# Patient Record
Sex: Male | Born: 1945
Health system: Southern US, Community
[De-identification: ages and names within clinical notes are randomized; demographics above are authoritative.]

## PROBLEM LIST (undated history)

## (undated) DIAGNOSIS — D6861 Antiphospholipid syndrome: Secondary | ICD-10-CM

## (undated) DIAGNOSIS — I10 Essential (primary) hypertension: Secondary | ICD-10-CM

## (undated) DIAGNOSIS — I2699 Other pulmonary embolism without acute cor pulmonale: Secondary | ICD-10-CM

## (undated) DIAGNOSIS — E039 Hypothyroidism, unspecified: Secondary | ICD-10-CM

## (undated) DIAGNOSIS — R4189 Other symptoms and signs involving cognitive functions and awareness: Secondary | ICD-10-CM

## (undated) DIAGNOSIS — E119 Type 2 diabetes mellitus without complications: Secondary | ICD-10-CM

## (undated) DIAGNOSIS — I350 Nonrheumatic aortic (valve) stenosis: Secondary | ICD-10-CM

## (undated) DIAGNOSIS — Z720 Tobacco use: Secondary | ICD-10-CM

---

## 2014-12-24 ENCOUNTER — Encounter (HOSPITAL_COMMUNITY): Payer: Self-pay | Admitting: *Deleted

## 2014-12-24 ENCOUNTER — Inpatient Hospital Stay (HOSPITAL_COMMUNITY): Payer: Medicare Other

## 2014-12-24 ENCOUNTER — Emergency Department (HOSPITAL_COMMUNITY): Payer: Medicare Other

## 2014-12-24 ENCOUNTER — Inpatient Hospital Stay (HOSPITAL_COMMUNITY)
Admission: EM | Admit: 2014-12-24 | Discharge: 2014-12-27 | DRG: 176 | Disposition: A | Discharge status: Home / Self Care | Payer: Medicare Other | Attending: Internal Medicine | Admitting: Internal Medicine

## 2014-12-24 DIAGNOSIS — R0602 Shortness of breath: Secondary | ICD-10-CM | POA: Diagnosis present

## 2014-12-24 DIAGNOSIS — Z87891 Personal history of nicotine dependence: Secondary | ICD-10-CM

## 2014-12-24 DIAGNOSIS — IMO0001 Reserved for inherently not codable concepts without codable children: Secondary | ICD-10-CM | POA: Insufficient documentation

## 2014-12-24 DIAGNOSIS — D7589 Other specified diseases of blood and blood-forming organs: Secondary | ICD-10-CM | POA: Diagnosis present

## 2014-12-24 DIAGNOSIS — I2699 Other pulmonary embolism without acute cor pulmonale: Secondary | ICD-10-CM | POA: Diagnosis not present

## 2014-12-24 DIAGNOSIS — I82411 Acute embolism and thrombosis of right femoral vein: Secondary | ICD-10-CM | POA: Diagnosis present

## 2014-12-24 DIAGNOSIS — R55 Syncope and collapse: Secondary | ICD-10-CM | POA: Diagnosis present

## 2014-12-24 DIAGNOSIS — R0781 Pleurodynia: Secondary | ICD-10-CM | POA: Diagnosis not present

## 2014-12-24 DIAGNOSIS — I10 Essential (primary) hypertension: Secondary | ICD-10-CM | POA: Diagnosis not present

## 2014-12-24 DIAGNOSIS — I82409 Acute embolism and thrombosis of unspecified deep veins of unspecified lower extremity: Secondary | ICD-10-CM | POA: Insufficient documentation

## 2014-12-24 DIAGNOSIS — R911 Solitary pulmonary nodule: Secondary | ICD-10-CM | POA: Diagnosis present

## 2014-12-24 DIAGNOSIS — I82401 Acute embolism and thrombosis of unspecified deep veins of right lower extremity: Secondary | ICD-10-CM | POA: Diagnosis not present

## 2014-12-24 DIAGNOSIS — R030 Elevated blood-pressure reading, without diagnosis of hypertension: Secondary | ICD-10-CM

## 2014-12-24 DIAGNOSIS — Z9181 History of falling: Secondary | ICD-10-CM | POA: Diagnosis not present

## 2014-12-24 DIAGNOSIS — I82431 Acute embolism and thrombosis of right popliteal vein: Secondary | ICD-10-CM | POA: Diagnosis present

## 2014-12-24 DIAGNOSIS — I2692 Saddle embolus of pulmonary artery without acute cor pulmonale: Principal | ICD-10-CM | POA: Diagnosis present

## 2014-12-24 DIAGNOSIS — Z86711 Personal history of pulmonary embolism: Secondary | ICD-10-CM | POA: Insufficient documentation

## 2014-12-24 LAB — BASIC METABOLIC PANEL
ANION GAP: 12 (ref 5–15)
BUN: 14 mg/dL (ref 6–20)
CALCIUM: 8.9 mg/dL (ref 8.9–10.3)
CO2: 17 mmol/L — ABNORMAL LOW (ref 22–32)
Chloride: 106 mmol/L (ref 101–111)
Creatinine, Ser: 1.08 mg/dL (ref 0.61–1.24)
Glucose, Bld: 231 mg/dL — ABNORMAL HIGH (ref 65–99)
POTASSIUM: 3.8 mmol/L (ref 3.5–5.1)
SODIUM: 135 mmol/L (ref 135–145)

## 2014-12-24 LAB — CBC
HCT: 40.1 % (ref 39.0–52.0)
HEMATOCRIT: 41.4 % (ref 39.0–52.0)
Hemoglobin: 14.2 g/dL (ref 13.0–17.0)
Hemoglobin: 14.2 g/dL (ref 13.0–17.0)
MCH: 39.7 pg — ABNORMAL HIGH (ref 26.0–34.0)
MCH: 38.7 pg — ABNORMAL HIGH (ref 26.0–34.0)
MCHC: 35.4 g/dL (ref 30.0–36.0)
MCHC: 34.3 g/dL (ref 30.0–36.0)
MCV: 112 fL — ABNORMAL HIGH (ref 78.0–100.0)
MCV: 112.8 fL — ABNORMAL HIGH (ref 78.0–100.0)
PLATELETS: 110 10*3/uL — AB (ref 150–400)
Platelets: 131 10*3/uL — ABNORMAL LOW (ref 150–400)
RBC: 3.58 MIL/uL — AB (ref 4.22–5.81)
RBC: 3.67 MIL/uL — ABNORMAL LOW (ref 4.22–5.81)
RDW: 13.2 % (ref 11.5–15.5)
RDW: 13.7 % (ref 11.5–15.5)
WBC: 6.7 10*3/uL (ref 4.0–10.5)
WBC: 9.8 10*3/uL (ref 4.0–10.5)

## 2014-12-24 LAB — URINALYSIS, ROUTINE W REFLEX MICROSCOPIC
Bilirubin Urine: NEGATIVE
Glucose, UA: NEGATIVE mg/dL
Ketones, ur: NEGATIVE mg/dL
LEUKOCYTES UA: NEGATIVE
NITRITE: NEGATIVE
PROTEIN: 30 mg/dL — AB
Specific Gravity, Urine: >1.046 — ABNORMAL HIGH (ref 1.005–1.030)
UROBILINOGEN UA: 1 mg/dL (ref 0.0–1.0)
pH: 5.5 (ref 5.0–8.0)

## 2014-12-24 LAB — D-DIMER, QUANTITATIVE (NOT AT ARMC): D DIMER QUANT: 7.19 ug{FEU}/mL — AB (ref 0.00–0.48)

## 2014-12-24 LAB — HEPARIN LEVEL (UNFRACTIONATED): HEPARIN UNFRACTIONATED: 0.26 [IU]/mL — AB (ref 0.30–0.70)

## 2014-12-24 LAB — CBG MONITORING, ED: GLUCOSE-CAPILLARY: 160 mg/dL — AB (ref 65–99)

## 2014-12-24 LAB — GLUCOSE, CAPILLARY: GLUCOSE-CAPILLARY: 209 mg/dL — AB (ref 65–99)

## 2014-12-24 LAB — TROPONIN I: Troponin I: 0.43 ng/mL — ABNORMAL HIGH (ref <0.031)

## 2014-12-24 LAB — URINE MICROSCOPIC-ADD ON

## 2014-12-24 LAB — APTT: APTT: 95 s — AB (ref 24–37)

## 2014-12-24 LAB — FIBRINOGEN: FIBRINOGEN: 333 mg/dL (ref 204–475)

## 2014-12-24 LAB — I-STAT TROPONIN, ED: TROPONIN I, POC: 0.01 ng/mL (ref 0.00–0.08)

## 2014-12-24 LAB — PROTIME-INR
INR: 1.24 (ref 0.00–1.49)
Prothrombin Time: 15.8 seconds — ABNORMAL HIGH (ref 11.6–15.2)

## 2014-12-24 LAB — MRSA PCR SCREENING: MRSA BY PCR: NEGATIVE

## 2014-12-24 LAB — BRAIN NATRIURETIC PEPTIDE: B Natriuretic Peptide: 84.7 pg/mL (ref 0.0–100.0)

## 2014-12-24 MED ORDER — SODIUM CHLORIDE 0.9 % IV SOLN: 250.0000 mL | INTRAVENOUS | Status: DC | PRN

## 2014-12-24 MED ORDER — FENTANYL CITRATE (PF) 100 MCG/2ML IJ SOLN
INTRAMUSCULAR | Status: AC
  Filled 2014-12-24: qty 2

## 2014-12-24 MED ORDER — MIDAZOLAM HCL 2 MG/2ML IJ SOLN
INTRAMUSCULAR | Status: AC | PRN
  Administered 2014-12-24: 1 mg via INTRAVENOUS

## 2014-12-24 MED ORDER — ACETAMINOPHEN 325 MG PO TABS: 650.0000 mg | ORAL_TABLET | Freq: Four times a day (QID) | ORAL | Status: DC | PRN

## 2014-12-24 MED ORDER — SODIUM CHLORIDE 0.9 % IV SOLN
INTRAVENOUS | Status: DC
  Administered 2014-12-24: 18:00:00 via INTRAVENOUS

## 2014-12-24 MED ORDER — SODIUM CHLORIDE 0.9 % IV SOLN
12.0000 mg | Freq: Once | INTRAVENOUS | Status: AC
  Administered 2014-12-24: 12 mg via INTRAVENOUS
  Filled 2014-12-24 (×2): qty 12

## 2014-12-24 MED ORDER — MIDAZOLAM HCL 2 MG/2ML IJ SOLN
INTRAMUSCULAR | Status: AC
  Filled 2014-12-24: qty 2

## 2014-12-24 MED ORDER — SODIUM CHLORIDE 0.9 % IJ SOLN
3.0000 mL | INTRAMUSCULAR | Status: DC | PRN
  Administered 2014-12-25: 3 mL via INTRAVENOUS
  Filled 2014-12-24: qty 3

## 2014-12-24 MED ORDER — ONDANSETRON HCL 4 MG/2ML IJ SOLN: 4.0000 mg | Freq: Four times a day (QID) | INTRAMUSCULAR | Status: DC | PRN

## 2014-12-24 MED ORDER — SODIUM CHLORIDE 0.9 % IV SOLN
INTRAVENOUS | Status: DC
  Administered 2014-12-24 – 2014-12-26 (×2): via INTRAVENOUS

## 2014-12-24 MED ORDER — SODIUM CHLORIDE 0.9 % IV SOLN
INTRAVENOUS | Status: DC
  Administered 2014-12-25: 05:00:00 via INTRAVENOUS

## 2014-12-24 MED ORDER — ALTEPLASE 50 MG IV SOLR
12.0000 mg | Freq: Once | INTRAVENOUS | Status: AC
  Administered 2014-12-24: 12 mg via INTRAVENOUS
  Filled 2014-12-24: qty 12

## 2014-12-24 MED ORDER — FENTANYL CITRATE (PF) 100 MCG/2ML IJ SOLN
INTRAMUSCULAR | Status: AC | PRN
  Administered 2014-12-24 (×2): 50 ug via INTRAVENOUS

## 2014-12-24 MED ORDER — HEPARIN (PORCINE) IN NACL 100-0.45 UNIT/ML-% IJ SOLN
1600.0000 [IU]/h | INTRAMUSCULAR | Status: DC
  Administered 2014-12-24: 1700 [IU]/h via INTRAVENOUS
  Administered 2014-12-25: 1900 [IU]/h via INTRAVENOUS
  Administered 2014-12-25: 1800 [IU]/h via INTRAVENOUS
  Filled 2014-12-24 (×7): qty 250

## 2014-12-24 MED ORDER — IOHEXOL 350 MG/ML SOLN
100.0000 mL | Freq: Once | INTRAVENOUS | Status: AC | PRN
Start: 2014-12-24 — End: 2014-12-24
  Administered 2014-12-24: 100 mL via INTRAVENOUS

## 2014-12-24 MED ORDER — HEPARIN SODIUM (PORCINE) 5000 UNIT/ML IJ SOLN
6000.0000 [IU] | Freq: Once | INTRAMUSCULAR | Status: AC
  Administered 2014-12-24: 6000 [IU] via INTRAVENOUS
  Filled 2014-12-24: qty 2

## 2014-12-24 MED ORDER — LORAZEPAM 2 MG/ML IJ SOLN
INTRAMUSCULAR | Status: AC | PRN
  Administered 2014-12-24: 1 mg via INTRAVENOUS

## 2014-12-24 MED ORDER — LIDOCAINE HCL 1 % IJ SOLN
INTRAMUSCULAR | Status: AC
  Filled 2014-12-24: qty 20

## 2014-12-24 MED ORDER — IOHEXOL 300 MG/ML  SOLN
50.0000 mL | Freq: Once | INTRAMUSCULAR | Status: DC | PRN
  Administered 2014-12-24: 15 mL via INTRA_ARTERIAL
  Filled 2014-12-24: qty 50

## 2014-12-24 MED ORDER — SODIUM CHLORIDE 0.9 % IV SOLN
INTRAVENOUS | Status: DC
  Administered 2014-12-24 – 2014-12-25 (×2): via INTRAVENOUS

## 2014-12-24 MED ORDER — SODIUM CHLORIDE 0.9 % IJ SOLN
3.0000 mL | Freq: Two times a day (BID) | INTRAMUSCULAR | Status: DC
  Administered 2014-12-24 – 2014-12-26 (×4): 3 mL via INTRAVENOUS

## 2014-12-24 NOTE — ED Notes (Signed)
CBG = 160  RN [name] informed of result.

## 2014-12-24 NOTE — ED Notes (Signed)
Pt placed in gown and in bed. Pt monitored by pulse ox, bp cuff, and 12-lead.   Pt states that wallet is missing and the last time he had it he showed his license to someone up front. Unable to locate wallet on pt's person or in room A05. Apolinar Junes EMT checked wheelchair and triage for wallet, unable to locate. Front desk checked by pt's family and by [employer], unable to locate.

## 2014-12-24 NOTE — Sedation Documentation (Signed)
Patient denies pain and is resting comfortably.  

## 2014-12-24 NOTE — Sedation Documentation (Signed)
Patient is resting comfortably. 

## 2014-12-24 NOTE — Procedures (Signed)
Successful initiation of bilateral US assisted pulmonary arterial lysis with EKOS. Access: R CFV x 2 - keep R leg straight while catheters are in place. No immediate post procedural complications.  Katherina Right, MD Pager #: [phone]

## 2014-12-24 NOTE — Progress Notes (Addendum)
ANTICOAGULATION CONSULT NOTE - Initial Consult  Pharmacy Consult for heparin Indication: pulmonary embolus  No Known Allergies  Patient Measurements: Height: 5\' 11"  (180.3 cm) Weight: 228 lb (103.42 kg) IBW/kg (Calculated) : 75.3 Heparin Dosing Weight: 96.9kg  Vital Signs: Temp: 97.6 F (36.4 C) (09/12 0845) Temp Source: Oral (09/12 0845) BP: 117/76 mmHg (09/12 1030) Pulse Rate: 84 (09/12 1030)  Labs:  Recent Labs  12/24/14 0900  HGB 14.2  HCT 40.1  PLT 131*  CREATININE 1.08    Estimated Creatinine Clearance: 76.8 mL/min (by C-G formula based on Cr of 1.08).   Medical History: History reviewed. No pertinent past medical history.   Assessment: 69 yom to ED with LOC, found to have large saddle PE, nearly occlusive on CTa. Pharmacy consulted to dose heparin (no anticoag pta documented). No PMH or cardiac hx. Hg wnl, plt low 131 on admit. No bleed documented.  Goal of Therapy:  Heparin level 0.3-0.7 units/ml Monitor platelets by anticoagulation protocol: Yes   Plan:  Heparin 6000 unit bolus Heparin IV @ 1700 units/h 8h HL Daily HL/CBC Mon s/sx bleeding  Babs Bertin, PharmD Clinical Pharmacist Pager [phone] 12/24/2014 12:29 PM  ________________________________ Addendum:  Patient on EKOS protocol and heparin with goal of 0.3 - 0.7. Current heparin level is 0.26. No bleeding per RN  Plan:  Increase heparin to 1900 units/hr (no bolus since receiving TPA) 0300 repeat HL  Isaac Bliss, PharmD, [facility] Clinical Pharmacist Pager [phone] 12/24/2014 8:43 PM

## 2014-12-24 NOTE — ED Notes (Signed)
Pt reports being sob with exertion x 2 weeks, possible swelling to extremities and reports intermittent chest discomfort. Had near syncopal episode today. On arrival, pt is pale and clammy. ekg done.

## 2014-12-24 NOTE — ED Provider Notes (Signed)
CSN: [id number]     Arrival date & time 12/24/14  4098 History   First MD Initiated Contact with Patient 12/24/14 [phone]     Chief Complaint  Patient presents with  . Loss of Consciousness     (Consider location/radiation/quality/duration/timing/severity/associated sxs/prior Treatment) HPI    Patient is a 69 year old male with no past medical history presenting with syncope and shortness of breath. Patient's been having increasing shortness of breath for the last 2 weeks. Patient usually able to wander around his [place] acre property. However in the last 2 weeks is been unable to walk further than 200 feet. Today he walked out to feed deer, and felt faint, fell down. He says he remembers lowering himself to the ground did not hit his head.   Patient's had no recent car trips. No leg swelling. No history of cancer. No recent cardiac workup  History reviewed. No pertinent past medical history. History reviewed. No pertinent past surgical history. History reviewed. No pertinent family history. Social History  Substance Use Topics  . Smoking status: Never Smoker   . Smokeless tobacco: None  . Alcohol Use: No    Review of Systems  Constitutional: Positive for diaphoresis and fatigue. Negative for fever and activity change.  HENT: Negative for drooling and hearing loss.   Eyes: Negative for discharge and redness.  Respiratory: Positive for shortness of breath. Negative for cough and chest tightness.   Cardiovascular: Negative for chest pain, palpitations and leg swelling.  Gastrointestinal: Negative for abdominal pain.  Genitourinary: Negative for dysuria and urgency.  Musculoskeletal: Negative for arthralgias.  Allergic/Immunologic: Negative for immunocompromised state.  Neurological: Negative for seizures and speech difficulty.  Psychiatric/Behavioral: Negative for behavioral problems and agitation.  All other systems reviewed and are negative.     Allergies  Review of patient's  allergies indicates no known allergies.  Home Medications   Prior to Admission medications   Not on File   BP 139/79 mmHg  Pulse 96  Temp(Src) 97.6 F (36.4 C) (Oral)  Resp 18  Ht  (1.803 m)  Wt 228 lb (103.42 kg)  BMI 31.81 kg/m2  SpO2 94% Physical Exam  Constitutional: He is oriented to person, place, and time. He appears well-nourished.  Pale, tachypneic.  HENT:  Head: Normocephalic.  Mouth/Throat: Oropharynx is clear and moist.  Eyes: Conjunctivae are normal.  Neck: No tracheal deviation present.  Cardiovascular: Normal rate.   Pulmonary/Chest: Effort normal. No stridor. No respiratory distress.  Abdominal: Soft. There is no tenderness. There is no guarding.  Musculoskeletal: Normal range of motion. He exhibits no edema.  Neurological: He is oriented to person, place, and time. No cranial nerve deficit.  Skin: Skin is warm and dry. No rash noted. He is not diaphoretic.  Psychiatric: He has a normal mood and affect. His behavior is normal.  Nursing note and vitals reviewed.   ED Course  Procedures (including critical care time) Labs Review Labs Reviewed  BASIC METABOLIC PANEL  CBC  URINALYSIS, ROUTINE W REFLEX MICROSCOPIC (NOT AT [facility])  D-DIMER, QUANTITATIVE (NOT AT [facility])  BRAIN NATRIURETIC PEPTIDE  I-STAT TROPOININ, ED  CBG MONITORING, ED  I-STAT TROPOININ, ED    Imaging Review No results found. I have personally reviewed and evaluated these images and lab results as part of my medical decision-making.   EKG Interpretation   Date/Time:  Monday December 24 2014 08:42:09 EDT Ventricular Rate:  91 PR Interval:  160 QRS Duration: 102 QT Interval:  374 QTC Calculation: 460 R Axis:  118 Text Interpretation:  Sinus rhythm with occasional Premature ventricular  complexes Left posterior fascicular block Cannot rule out Inferior infarct  , age undetermined Possible Anterior infarct , age undetermined Abnormal  ECG no prior.  no acute ischemia. PVC.   Confirmed by Kandis Mannan  [phone]) on 12/24/2014 8:57:28 AM      MDM   Final diagnoses:  None    She is otherwise healthy 69 year old male presenting with 2 weeks of shortness of breath and presyncope today. Patient has been increase in shortness of breath and unable to walk more than 300 feet without having to stop and take a break. This is abnormal for him. Patient has no past medical history no cardiac history. Patient's EKG shows no acute infarction.  We will get troponin, chest x-ray, d-dimer. Patient will require inpatient stay for echo and workup for this new shortness of breath.  Courteney Randall An, MD 12/24/14 [phone]

## 2014-12-24 NOTE — ED Notes (Signed)
Family in hallway talking to security officer Dennard Nip.

## 2014-12-24 NOTE — Progress Notes (Signed)
Attempted echocardiogram several times since order was placed at 1:44pm but patient has been unavailable. Attempted again at 4:20pm, spoke with interventional X-Ray and they said they would be another hour. Spoke with the patient's nurse,Cyril, to let him know that if the Doctor still wanted the echo stat when X-Ray was finished that they needed to call the on-call Cardiologist and have them call the on-call sonographer in to complete the echo since it would be after hours, if not we would get to it first thing in the morning.

## 2014-12-24 NOTE — Consult Note (Signed)
Chief Complaint: Patient was seen in consultation today for B Pulmonary embolus Chief Complaint  Patient presents with  . Loss of Consciousness   at the request of Dr Vassie Loll  Referring Physician(s): Dr Vassie Loll   History of Present Illness: Roger Ramirez is a 69 y.o. male   Pt with 2 week worsening shortness of breath Does well sitting or lying still SOB within 200 feet walking This is highly unusual for pt. Comes to ED today after syncopal episode at home CTA IMPRESSION: 1. Large saddle pulmonary embolus, nearly occlusive within the right main pulmonary artery, extending into the bilateral central interlobar pulmonary arteries and into numerous segmental and subsegmental pulmonary artery branches involving all lobes. 2. Somewhat prominent right heart size and inferior vena cava raising the possibility of impending right heart failure. 3. Overall heart size is upper normal. No pericardial effusion. 4. No aortic aneurysm or dissection. 5. No pulmonary consolidation or pleural effusion. 6. 6 mm pulmonary nodule within the right upper lobe anteriorly. If the patient is at high risk for bronchogenic carcinoma, follow-up chest CT at 6-12 months is recommended. If the patient is at low risk for bronchogenic carcinoma, follow-up chest CT at 12 months is Recommended  Dr Vassie Loll has asked for consultation regarding EKOS PE thrombolysis  Dr Grace Isaac has reviewed imaging and approves procedure I have seen and examined pt Pt denies any recent surgeries or injuries Takes no anticoagulation meds EKG wnl Echo pending Ddimer high: 7.19   History reviewed. No pertinent past medical history.  History reviewed. No pertinent past surgical history.  Allergies: Review of patient's allergies indicates no known allergies.  Medications: Prior to Admission medications   Not on File     History reviewed. No pertinent family history.  Social History   Social History  . Marital Status:  Divorced    Spouse Name: N/A  . Number of Children: N/A  . Years of Education: N/A   Social History Main Topics  . Smoking status: Former Smoker -- 1.00 packs/day for 20 years    Types: Cigarettes  . Smokeless tobacco: None     Comment: Quit 1970's  . Alcohol Use: 0.0 oz/week    0 Standard drinks or equivalent per week     Comment: Occasional beer / 1 every 2 or 3 weeks  . Drug Use: No  . Sexual Activity: Not Asked   Other Topics Concern  . 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  All other systems are negative.  Review of Systems  Constitutional: Positive for activity change and fatigue.  Respiratory: Positive for shortness of breath. Negative for cough and wheezing.   Musculoskeletal: Negative for back pain.  Neurological: Positive for syncope. Negative for dizziness, tremors, seizures, facial asymmetry, speech difficulty, weakness, light-headedness, numbness and headaches.  Psychiatric/Behavioral: Negative for behavioral problems, confusion and decreased concentration.    Vital Signs: BP 114/100 mmHg  Pulse 86  Temp(Src) 97.6 F (36.4 C) (Oral)  Resp 24  Ht 5\' 11"  (1.803 m)  Wt 228 lb (103.42 kg)  BMI 31.81 kg/m2  SpO2 94%  Physical Exam  Constitutional: He is oriented to person, place, and time.  Eyes: EOM are normal.  Cardiovascular: Normal rate, regular rhythm and normal heart sounds.  Pulmonary/Chest: Effort normal and breath sounds normal. He has no wheezes.  Abdominal: Soft. Bowel sounds are normal. There is no tenderness.  Musculoskeletal: Normal range of motion.  Neurological: He is alert and oriented to person, place, and time.  Skin: Skin is warm and dry.  Psychiatric: He has a normal mood and affect. His behavior is normal. Judgment and thought  content normal.  Nursing note and vitals reviewed.   Mallampati Score:  MD Evaluation Airway: WNL Heart: WNL Abdomen: WNL Chest/ Lungs: WNL ASA  Classification: 2, 3 Mallampati/Airway Score: One  Imaging: Dg Chest 2 View  12/24/2014   CLINICAL DATA:  Chest pain and shortness of breath for 2 weeks.  EXAM: CHEST  2 VIEW  COMPARISON:  None.  FINDINGS: Upper limits normal heart size noted.  There may be trace bilateral pleural effusions present.  There is no evidence of focal airspace disease, pulmonary edema, suspicious pulmonary nodule/mass, or pneumothorax.  No acute bony abnormalities are identified.  IMPRESSION: Upper limits normal heart size and question trace bilateral pleural effusions.   Electronically Signed   By: Harmon Pier M.D.   On: 12/24/2014 09:53   Ct Angio Chest Pe W/cm &/or Wo Cm  12/24/2014   CLINICAL DATA:  Progressive shortness breath for 2 weeks.  EXAM: CT ANGIOGRAPHY CHEST WITH CONTRAST  TECHNIQUE: Multidetector CT imaging of the chest was performed using the standard protocol during bolus administration of intravenous contrast. Multiplanar CT image reconstructions and MIPs were obtained to evaluate the vascular anatomy.  CONTRAST:  OMNIPAQUE IOHEXOL 350 MG/ML SOLN  COMPARISON:  None.  FINDINGS: There is large acute saddle pulmonary embolus, nearly occlusive within the right main pulmonary artery, with extension into the bilateral inter-lobar pulmonary arteries and multiple segmental and subsegmental pulmonary artery branches involving all lobes.  Right heart is somewhat prominent suggesting impending heart failure. The inferior vena cava appears similarly distended. Overall heart size is upper normal. No pericardial effusion. Thoracic aorta is normal in caliber. No aortic dissection. Atherosclerotic calcifications noted along the walls of the thoracic aorta.  There is mild dependent atelectasis at each lung base. Lungs otherwise clear. No pleural effusion. No  pneumothorax. 6 mm pulmonary nodule noted within the right upper lobe anteriorly (series 7, image 44). Trachea and central bronchi are unremarkable. Limited images of the upper abdomen are unremarkable.  Review of the MIP images confirms the above findings.  IMPRESSION: 1. Large saddle pulmonary embolus, nearly occlusive within the right main pulmonary artery, extending into the bilateral central interlobar pulmonary arteries and into numerous segmental and subsegmental pulmonary artery branches involving all lobes. 2. Somewhat prominent right heart size and inferior vena cava raising the possibility of impending right heart failure. 3. Overall heart size is upper normal.  No pericardial effusion. 4. No aortic aneurysm or dissection. 5. No pulmonary consolidation or pleural effusion. 6. 6 mm pulmonary nodule within the right upper lobe anteriorly. If the patient is at high risk for bronchogenic carcinoma, follow-up chest CT at 6-12 months is recommended. If the patient is at low risk for bronchogenic carcinoma, follow-up chest CT at 12 months is recommended. This recommendation follows the consensus statement: Guidelines for Management of Small Pulmonary Nodules Detected on CT Scans: A Statement from the Fleischner Society as published in Radiology 2005;237:395-400. These results were called by telephone at the time of interpretation on 12/24/2014 at 11:43 am to Dr. Bary Castilla , who was unavailable. Findings discussed with the charge nurse, Efraim Kaufmann, who acknowledged the results.  Electronically Signed   By: Bary Richard M.D.   On: 12/24/2014 11:51    Labs:  CBC:  Recent Labs  12/24/14 0900  WBC 6.7  HGB 14.2  HCT 40.1  PLT 131*    COAGS: No results for input(s): INR, APTT in the last 8760 hours.  BMP:  Recent Labs  12/24/14 0900  NA 135  K 3.8  CL 106  CO2 17*  GLUCOSE 231*  BUN 14  CALCIUM 8.9  CREATININE 1.08  GFRNONAA >60  GFRAA >60    LIVER FUNCTION TESTS: No results  for input(s): BILITOT, AST, ALT, ALKPHOS, PROT, ALBUMIN in the last 8760 hours.  TUMOR MARKERS: No results for input(s): AFPTM, CEA, CA199, CHROMGRNA in the last 8760 hours.  Assessment and Plan:  Syncopal episode today Worsening shortness of breath with exertion x 2-3 weeks CTA reveals B PE with R Heart strain  CCM Dr Vassie Loll and Dr Grace Isaac Rec: EKOS Thrombolysis Pt now scheduled for same Pt and family aware of procedure benefits and risks including but not limited to Infection; bleeding; bleeding from remote site; vessel damage; death Pt and family agreeable to proceed Consent signed andin chart   Thank you for this interesting consult.  I greatly enjoyed meeting Roger Ramirez and look forward to participating in their care.  A copy of this report was sent to the requesting provider on this date.  Signed: Addylynn Balin A 12/24/2014, 2:07 PM   I spent a total of 40 Minutes    in face to face in clinical consultation, greater than 50% of which was counseling/coordinating care for B PE EKOS thrombolysis

## 2014-12-24 NOTE — ED Notes (Signed)
Family has located pt's wallet. Wallet was in his pants leg by his ankle.

## 2014-12-24 NOTE — Progress Notes (Signed)
Called [facility], spoke with Marisue Ivan RN about troponin level 0.43. RN Marisue Ivan will report to MD. Will continue to assess and monitor

## 2014-12-24 NOTE — Sedation Documentation (Signed)
Main PA pressure 84/30/49

## 2014-12-24 NOTE — H&P (Signed)
PULMONARY / CRITICAL CARE MEDICINE   Name: Roger Ramirez MRN: [id number] DOB: 03-09-1946    ADMISSION DATE:  12/24/2014 CONSULTATION DATE:  12/24/14  REFERRING MD :  EDP   CHIEF COMPLAINT:  PE / Near Syncope   INITIAL PRESENTATION: 69 y/o M, former remote smoker (quit early 1970) who presented to [facility] ER on 9/12 with 2-3 weeks of weakness, near syncopal episode and progressive SOB.  Work up positive for large/saddle PE.  PCCM consulted for evaluation.    STUDIES:  9/12  CTA Chest >> large saddle PE, nearly occlusive on the R, concern for R heart failure, 6 mm RUL anterior nodule  SIGNIFICANT EVENTS: 9/12  Admit with large PE   HISTORY OF PRESENT ILLNESS:  69 y/o M, former remote smoker (quit early 1970) who presented to [facility] ER on 9/12 with 2-3 weeks of weakness, near syncopal episode and progressive SOB.   The patient reports he has no known medical history and is on no medications at baseline.  He is followed by Dr. Nehemiah Settle for primary care.  He reports he is up to date on colonoscopy (was told he would not need another). The patient reports he has noted approximately 2-3 weeks of progressive shortness of breath and weakness.  Am of admission, he walked to the edge of his [place] ([place] to feed the deer and became so weak he felt as though he would pass out.  He laid down on the ground and later was able to walk back to the house.  The patients family noted him to be pale and diaphoretic.  He has noted some lower extremity swelling and chest discomfort.    On arrival to ER, he was documented to be clammy & pale.  Initial lab work noted low CO2 on BMP, elevated glucose at 231, troponin 0.01, BNP 84, WBC 6.7, Hgb 14.2, MCV 112 and platelets of 131.  Given symptoms, D-Dimer was assessed and elevated at 7.19.  Follow up CTA of the Chest demonstrated a large saddle PE, nearly occlusive on the R, concern for R heart failure, & 6 mm RUL anterior nodule.  The patient denies family history  of blood clots, autoimmune disorders, recent travel, and cancer.  The patient does report being sedentary at baseline.  His family calls him "potato" because he sits in front of the TV.  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    PAST MEDICAL HISTORY :   has no past medical history on file.  has no past surgical history on file.   Prior to Admission medications   Not on File   No Known Allergies  FAMILY HISTORY:  has no family status information on file.    SOCIAL HISTORY:  reports that he has quit smoking. His smoking use included Cigarettes. He has a 20 pack-year smoking history. He does not have any smokeless tobacco history on file. He reports that he drinks alcohol. He reports that he does not use illicit drugs.  REVIEW OF SYSTEMS:   Gen: Denies fever, chills, weight change, fatigue, night sweats HEENT: Denies blurred vision, double vision, hearing loss, tinnitus, sinus congestion, rhinorrhea, sore throat, neck stiffness, dysphagia PULM: Denies cough, sputum production, hemoptysis, wheezing.  Reports shortness of breath, chest discomfort.  CV: Denies orthopnea, paroxysmal nocturnal dyspnea, palpitations.  Reports mild LE edema  GI: Denies abdominal pain, nausea, vomiting, diarrhea, hematochezia, melena, constipation, change in bowel habits GU: Denies dysuria, hematuria, polyuria, oliguria,  urethral discharge Endocrine: Denies hot or cold intolerance, polyuria, polyphagia or appetite change Derm: Denies rash, dry skin, scaling or peeling skin change Heme: Denies easy bruising, bleeding, bleeding gums Neuro: Denies headache, numbness, weakness, slurred speech, loss of memory or consciousness   SUBJECTIVE:   VITAL SIGNS: Temp:  [97.6 F (36.4 C)] 97.6 F (36.4 C) (09/12 0845) Pulse Rate:  [83-96] 83 (09/12 1400) Resp:  [18-31] 18 (09/12 1400) BP: (110-154)/(71-100) 148/91 mmHg (09/12 1400) SpO2:  [90 %-97 %] 96 % (09/12 1400) Weight:   [228 lb (103.42 kg)] 228 lb (103.42 kg) (09/12 0845)   HEMODYNAMICS:     VENTILATOR SETTINGS:     INTAKE / OUTPUT:  Intake/Output Summary (Last 24 hours) at 12/24/14 1434 Last data filed at 12/24/14 1402  Gross per 24 hour  Intake      0 ml  Output    250 ml  Net   -250 ml    PHYSICAL EXAMINATION: General:  wdwn elderly male in NAD Neuro:  AAOx4, speech clear, MAE  HEENT:  MM pink/moist, no jvd, good dentition  Cardiovascular:  s1s2 rrr, no m/r/g Lungs:  resp's even/non-labored, lungs bilaterally clear  Abdomen:  Obese/soft, bsx4 active  Musculoskeletal:  No acute deformities  Skin:  Warm/dry, no significant edema   LABS:  CBC  Recent Labs Lab 12/24/14 0900  WBC 6.7  HGB 14.2  HCT 40.1  PLT 131*   Coag's No results for input(s): APTT, INR in the last 168 hours.   BMET  Recent Labs Lab 12/24/14 0900  NA 135  K 3.8  CL 106  CO2 17*  BUN 14  CREATININE 1.08  GLUCOSE 231*   Electrolytes  Recent Labs Lab 12/24/14 0900  CALCIUM 8.9   Sepsis Markers No results for input(s): LATICACIDVEN, PROCALCITON, O2SATVEN in the last 168 hours.   ABG No results for input(s): PHART, PCO2ART, PO2ART in the last 168 hours.   Liver Enzymes No results for input(s): AST, ALT, ALKPHOS, BILITOT, ALBUMIN in the last 168 hours.   Cardiac Enzymes No results for input(s): TROPONINI, PROBNP in the last 168 hours.   Glucose  Recent Labs Lab 12/24/14 1130  GLUCAP 160*    Imaging Dg Chest 2 View  12/24/2014   CLINICAL DATA:  Chest pain and shortness of breath for 2 weeks.  EXAM: CHEST  2 VIEW  COMPARISON:  None.  FINDINGS: Upper limits normal heart size noted.  There may be trace bilateral pleural effusions present.  There is no evidence of focal airspace disease, pulmonary edema, suspicious pulmonary nodule/mass, or pneumothorax.  No acute bony abnormalities are identified.  IMPRESSION: Upper limits normal heart size and question trace bilateral pleural effusions.    Electronically Signed   By: Harmon Pier M.D.   On: 12/24/2014 09:53   Ct Angio Chest Pe W/cm &/or Wo Cm  12/24/2014   CLINICAL DATA:  Progressive shortness breath for 2 weeks.  EXAM: CT ANGIOGRAPHY CHEST WITH CONTRAST  TECHNIQUE: Multidetector CT imaging of the chest was performed using the standard protocol during bolus administration of intravenous contrast. Multiplanar CT image reconstructions and MIPs were obtained to evaluate the vascular anatomy.  CONTRAST:  OMNIPAQUE IOHEXOL 350 MG/ML SOLN  COMPARISON:  None.  FINDINGS: There is large acute saddle pulmonary embolus, nearly occlusive within the right main pulmonary artery, with extension into the bilateral inter-lobar pulmonary arteries and multiple segmental and subsegmental pulmonary artery branches involving all lobes.  Right heart is somewhat prominent suggesting impending heart  failure. The inferior vena cava appears similarly distended. Overall heart size is upper normal. No pericardial effusion. Thoracic aorta is normal in caliber. No aortic dissection. Atherosclerotic calcifications noted along the walls of the thoracic aorta.  There is mild dependent atelectasis at each lung base. Lungs otherwise clear. No pleural effusion. No pneumothorax. 6 mm pulmonary nodule noted within the right upper lobe anteriorly (series 7, image 44). Trachea and central bronchi are unremarkable. Limited images of the upper abdomen are unremarkable.  Review of the MIP images confirms the above findings.  IMPRESSION: 1. Large saddle pulmonary embolus, nearly occlusive within the right main pulmonary artery, extending into the bilateral central interlobar pulmonary arteries and into numerous segmental and subsegmental pulmonary artery branches involving all lobes. 2. Somewhat prominent right heart size and inferior vena cava raising the possibility of impending right heart failure. 3. Overall heart size is upper normal.  No pericardial effusion. 4. No aortic aneurysm  or dissection. 5. No pulmonary consolidation or pleural effusion. 6. 6 mm pulmonary nodule within the right upper lobe anteriorly. If the patient is at high risk for bronchogenic carcinoma, follow-up chest CT at 6-12 months is recommended. If the patient is at low risk for bronchogenic carcinoma, follow-up chest CT at 12 months is recommended. This recommendation follows the consensus statement: Guidelines for Management of Small Pulmonary Nodules Detected on CT Scans: A Statement from the Fleischner Society as published in Radiology 2005;237:395-400. These results were called by telephone at the time of interpretation on 12/24/2014 at 11:43 am to Dr. Bary Castilla , who was unavailable. Findings discussed with the charge nurse, Efraim Kaufmann, who acknowledged the results.   Electronically Signed   By: Bary Richard M.D.   On: 12/24/2014 11:51     ASSESSMENT / PLAN:  PULMONARY OETT A: Large Bilateral / Saddle PE - with near syncope event, unprovoked 6mm Anterior RUL Pulmonary Nodule  Former Smoker - remote, quit in 1970's P:   Heparin gtt per pharmacy  IR consulted for evaluation for EKOS procedure Intermittent CXR Pulmonary hygiene Bed rest until heparin level therapeutic  Will need follow up CT imaging to review RUL pulmonary nodule  Likely will need 1 year minimum for PE if not lifelong anticoagulation  Will discuss oral anticoagulation with patient once EKOS protocol complete  CARDIOVASCULAR CVL A:  Sinus Rhythm  CT Evidence of R Heart Strain  P:  Assess ECHO to review R heart ICU monitoring EKG in am   Trend troponin   RENAL A:   No acute issues  P:   Trend BMP / UOP  Replace electrolytes as indicated   GASTROINTESTINAL A:   No acute issues  P:   NPO for procedure Regular diet as tolerated otherwise  HEMATOLOGIC A:   Macrocytosis  PE  R/O DVT P:  Trend CBC  Heparin gtt per pharmacy  Daily heparin level Assess LE venous duplex   NEUROLOGIC A:   Pain - in  setting of PE P:   PRN tylenol for pain  Supportive care   FAMILY  - Updates:  Patient and multiple family members updated at bedside.    - Inter-disciplinary family meet or Palliative Care meeting due by: 9/19    Canary Brim, NP-C [facility] Pgr: [phone] or if no answer [phone] 12/24/2014, 2:34 PM

## 2014-12-24 NOTE — ED Notes (Signed)
Family requesting security to look at cameras in lobby for wallet. Annalaya Wile EMT called security office at [phone]. Security office states that they are closed for orientation until 1pm. After discussion security office states that they are sending someone up to [facility] to talk.

## 2014-12-25 ENCOUNTER — Inpatient Hospital Stay (HOSPITAL_COMMUNITY): Payer: Medicare Other

## 2014-12-25 DIAGNOSIS — R55 Syncope and collapse: Secondary | ICD-10-CM

## 2014-12-25 DIAGNOSIS — I2699 Other pulmonary embolism without acute cor pulmonale: Secondary | ICD-10-CM

## 2014-12-25 DIAGNOSIS — I10 Essential (primary) hypertension: Secondary | ICD-10-CM

## 2014-12-25 LAB — CBC
HCT: 36.5 % — ABNORMAL LOW (ref 39.0–52.0)
HCT: 36.1 % — ABNORMAL LOW (ref 39.0–52.0)
HEMATOCRIT: 34.3 % — AB (ref 39.0–52.0)
Hemoglobin: 12.4 g/dL — ABNORMAL LOW (ref 13.0–17.0)
Hemoglobin: 11.8 g/dL — ABNORMAL LOW (ref 13.0–17.0)
Hemoglobin: 12.2 g/dL — ABNORMAL LOW (ref 13.0–17.0)
MCH: 38.3 pg — ABNORMAL HIGH (ref 26.0–34.0)
MCH: 38.8 pg — ABNORMAL HIGH (ref 26.0–34.0)
MCH: 38.4 pg — ABNORMAL HIGH (ref 26.0–34.0)
MCHC: 34 g/dL (ref 30.0–36.0)
MCHC: 34.4 g/dL (ref 30.0–36.0)
MCHC: 33.8 g/dL (ref 30.0–36.0)
MCV: 112.7 fL — ABNORMAL HIGH (ref 78.0–100.0)
MCV: 112.8 fL — AB (ref 78.0–100.0)
MCV: 113.5 fL — ABNORMAL HIGH (ref 78.0–100.0)
PLATELETS: 130 10*3/uL — AB (ref 150–400)
PLATELETS: 117 10*3/uL — AB (ref 150–400)
PLATELETS: 117 10*3/uL — AB (ref 150–400)
RBC: 3.24 MIL/uL — ABNORMAL LOW (ref 4.22–5.81)
RBC: 3.04 MIL/uL — ABNORMAL LOW (ref 4.22–5.81)
RBC: 3.18 MIL/uL — ABNORMAL LOW (ref 4.22–5.81)
RDW: 13.3 % (ref 11.5–15.5)
RDW: 13.5 % (ref 11.5–15.5)
RDW: 13.3 % (ref 11.5–15.5)
WBC: 6.8 10*3/uL (ref 4.0–10.5)
WBC: 5.9 10*3/uL (ref 4.0–10.5)
WBC: 5.5 10*3/uL (ref 4.0–10.5)

## 2014-12-25 LAB — FIBRINOGEN
Fibrinogen: 280 mg/dL (ref 204–475)
Fibrinogen: 267 mg/dL (ref 204–475)
Fibrinogen: 291 mg/dL (ref 204–475)

## 2014-12-25 LAB — BASIC METABOLIC PANEL
Anion gap: 5 (ref 5–15)
BUN: 12 mg/dL (ref 6–20)
CALCIUM: 8.1 mg/dL — AB (ref 8.9–10.3)
CO2: 22 mmol/L (ref 22–32)
CREATININE: 0.96 mg/dL (ref 0.61–1.24)
Chloride: 112 mmol/L — ABNORMAL HIGH (ref 101–111)
GFR calc Af Amer: >60 mL/min (ref >60)
GFR calc non Af Amer: >60 mL/min (ref >60)
GLUCOSE: 141 mg/dL — AB (ref 65–99)
Potassium: 4.5 mmol/L (ref 3.5–5.1)
Sodium: 139 mmol/L (ref 135–145)

## 2014-12-25 LAB — HEPARIN LEVEL (UNFRACTIONATED)
HEPARIN UNFRACTIONATED: 0.78 [IU]/mL — AB (ref 0.30–0.70)
HEPARIN UNFRACTIONATED: 0.87 [IU]/mL — AB (ref 0.30–0.70)
Heparin Unfractionated: 0.81 IU/mL — ABNORMAL HIGH (ref 0.30–0.70)

## 2014-12-25 LAB — TROPONIN I: TROPONIN I: 0.78 ng/mL — AB (ref <0.031)

## 2014-12-25 MED ORDER — FENTANYL CITRATE (PF) 100 MCG/2ML IJ SOLN
25.0000 ug | INTRAMUSCULAR | Status: DC | PRN
Start: 2014-12-25 — End: 2014-12-27
  Administered 2014-12-25: 50 ug via INTRAVENOUS
  Filled 2014-12-25: qty 2

## 2014-12-25 NOTE — Plan of Care (Signed)
Limited Echo done.

## 2014-12-25 NOTE — Progress Notes (Signed)
*  PRELIMINARY RESULTS* Vascular Ultrasound Lower extremity venous duplex has been completed.  Preliminary findings: DVT noted in the right femoral, popliteal, and peroneal veins. Superficial thrombosis noted in the right lesser saphenous vein. No DVT LLE. Could not image right common femoral vein due to bandages.   Farrel Demark, RDMS, RVT  12/25/2014, 10:17 AM

## 2014-12-25 NOTE — Progress Notes (Signed)
ANTICOAGULATION CONSULT NOTE - Follow-up Consult  Pharmacy Consult for heparin Indication: pulmonary embolus  No Known Allergies  Patient Measurements: Height: 5\' 11"  (180.3 cm) Weight: 228 lb (103.42 kg) IBW/kg (Calculated) : 75.3 Heparin Dosing Weight: 96.9kg  Vital Signs: Temp: 98.4 F (36.9 C) (09/12 2343) Temp Source: Oral (09/12 2343) BP: 128/73 mmHg (09/13 0200) Pulse Rate: 71 (09/13 0200)  Labs:  Recent Labs  12/24/14 0900 12/24/14 1911 12/24/14 1912 12/25/14 0218  HGB 14.2  --  14.2 12.4*  HCT 40.1  --  41.4 36.5*  PLT 131*  --  110* 130*  APTT  --   --  95*  --   LABPROT  --   --  15.8*  --   INR  --   --  1.24  --   HEPARINUNFRC  --  0.26*  --  0.78*  CREATININE 1.08  --   --   --   TROPONINI  --   --  0.43*  --     Estimated Creatinine Clearance: 76.8 mL/min (by C-G formula based on Cr of 1.08).  Assessment: 69 yom on heparin for large saddle PE. EKOS protocol with alteplase started yesterday ~1500. Heparin level supratherapeutic (0.78) on 1900 units/hr. No bleeding noted.   Goal of Therapy:  Heparin level 0.3-0.7 units/ml Monitor platelets by anticoagulation protocol: Yes   Plan:  Decrease heparin to 1800 units/hr Will f/u return to IR before scheduling heparin level  Christoper Fabian, PharmD, BCPS Clinical pharmacist, pager [phone] 12/25/2014 3:12 AM

## 2014-12-25 NOTE — Progress Notes (Signed)
PULMONARY / CRITICAL CARE MEDICINE   Name: Roger Ramirez MRN: [id number] DOB: 1946-03-08    ADMISSION DATE:  12/24/2014 CONSULTATION DATE:  12/24/14  REFERRING MD :  EDP   CHIEF COMPLAINT:  PE / Near Syncope   INITIAL PRESENTATION: 69 y/o M, former remote smoker (quit early 1970) who presented to [facility] ER on 9/12 with 2-3 weeks of weakness, near syncopal episode and progressive SOB.  Work up positive for large/saddle PE.  PCCM consulted for evaluation.    STUDIES:  9/12  CTA Chest >> large saddle PE, nearly occlusive on the R, concern for R heart failure, 6 mm RUL anterior nodule  SIGNIFICANT EVENTS: 9/12  Admit with large PE 9/13- Completed EKOS  HISTORY OF PRESENT ILLNESS:  69 y/o M, former remote smoker (quit early 1970) who presented to [facility] ER on 9/12 with 2-3 weeks of weakness, near syncopal episode and progressive SOB.   The patient reports he has no known medical history and is on no medications at baseline.  He is followed by Dr. Nehemiah Settle for primary care.  He reports he is up to date on colonoscopy (was told he would not need another). The patient reports he has noted approximately 2-3 weeks of progressive shortness of breath and weakness.  Am of admission, he walked to the edge of his [place] to feed the deer and became so weak he felt as though he would pass out.  He laid down on the ground and later was able to walk back to the house.  The patients family noted him to be pale and diaphoretic.  He has noted some lower extremity swelling and chest discomfort.    On arrival to ER, he was documented to be clammy & pale.  Initial lab work noted low CO2 on BMP, elevated glucose at 231, troponin 0.01, BNP 84, WBC 6.7, Hgb 14.2, MCV 112 and platelets of 131.  Given symptoms, D-Dimer was assessed and elevated at 7.19.  Follow up CTA of the Chest demonstrated a large saddle PE, nearly occlusive on the R, concern for R heart failure, & 6 mm RUL anterior nodule.  The patient  denies family history of blood clots, autoimmune disorders, recent travel, and cancer.  The patient does report being sedentary at baseline.  His family calls him "potato" because he sits in front of the TV.  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    PAST MEDICAL HISTORY :   has no past medical history on file.  has no past surgical history on file.   Prior to Admission medications   Not on File   No Known Allergies  FAMILY HISTORY:  has no family status information on file.    SOCIAL HISTORY:  reports that he has quit smoking. His smoking use included Cigarettes. He has a 20 pack-year smoking history. He does not have any smokeless tobacco history on file. He reports that he drinks alcohol. He reports that he does not use illicit drugs.  REVIEW OF SYSTEMS:   Gen: Denies fever, chills, weight change, fatigue, night sweats HEENT: Denies blurred vision, double vision, hearing loss, tinnitus, sinus congestion, rhinorrhea, sore throat, neck stiffness, dysphagia PULM: Denies cough, sputum production, hemoptysis, wheezing.  Reports shortness of breath, chest discomfort.  CV: Denies orthopnea, paroxysmal nocturnal dyspnea, palpitations.  Reports mild LE edema  GI: Denies abdominal pain, nausea, vomiting, diarrhea, hematochezia, melena, constipation, change in bowel habits GU: Denies dysuria, hematuria,  polyuria, oliguria, urethral discharge Endocrine: Denies hot or cold intolerance, polyuria, polyphagia or appetite change Derm: Denies rash, dry skin, scaling or peeling skin change Heme: Denies easy bruising, bleeding, bleeding gums Neuro: Denies headache, numbness, weakness, slurred speech, loss of memory or consciousness   SUBJECTIVE:   VITAL SIGNS: Temp:  [98 F (36.7 C)-99.3 F (37.4 C)] 98.7 F (37.1 C) (09/13 0751) Pulse Rate:  [64-100] 70 (09/13 1000) Resp:  [14-31] 22 (09/13 1000) BP: (97-151)/(55-100) 149/79 mmHg (09/13 1000) SpO2:   [90 %-98 %] 94 % (09/13 1000) Weight:  [234 lb 2.1 oz (106.2 kg)] 234 lb 2.1 oz (106.2 kg) (09/13 0457)   HEMODYNAMICS:     VENTILATOR SETTINGS:     INTAKE / OUTPUT:  Intake/Output Summary (Last 24 hours) at 12/25/14 1038 Last data filed at 12/25/14 1000  Gross per 24 hour  Intake 2551.57 ml  Output    695 ml  Net 1856.57 ml    PHYSICAL EXAMINATION: General:  Elderly male. No distress Neuro:  Awake, Oriented X 3 HEENT:  Moist MM Cardiovascular:  S1, S2, No MRG. Lungs: Clear, No wheeze or crackles.  Abdomen:  Soft, + BS Skin:  Warm/dry, no edema   LABS:  CBC  Recent Labs Lab 12/24/14 1912 12/25/14 0218 12/25/14 0808  WBC 9.8 6.8 PENDING  HGB 14.2 12.4* 11.8*  HCT 41.4 36.5* 34.3*  PLT 110* 130* PENDING   Coag's  Recent Labs Lab 12/24/14 1912  APTT 95*  INR 1.24     BMET  Recent Labs Lab 12/24/14 0900 12/25/14 0542  NA 135 139  K 3.8 4.5  CL 106 112*  CO2 17* 22  BUN 14 12  CREATININE 1.08 0.96  GLUCOSE 231* 141*   Electrolytes  Recent Labs Lab 12/24/14 0900 12/25/14 0542  CALCIUM 8.9 8.1*   Sepsis Markers No results for input(s): LATICACIDVEN, PROCALCITON, O2SATVEN in the last 168 hours.   ABG No results for input(s): PHART, PCO2ART, PO2ART in the last 168 hours.   Liver Enzymes No results for input(s): AST, ALT, ALKPHOS, BILITOT, ALBUMIN in the last 168 hours.   Cardiac Enzymes  Recent Labs Lab 12/24/14 1912 12/25/14 0218  TROPONINI 0.43* 0.78*     Glucose  Recent Labs Lab 12/24/14 1130 12/24/14 1455  GLUCAP 160* 209*    Imaging    ASSESSMENT / PLAN:  PULMONARY OETT A: Large Bilateral / Saddle PE - with near syncope event, unprovoked. Rt DVT. S/p EKOS 6mm Anterior RUL Pulmonary Nodule  Former Smoker - remote, quit in 1970's P:   Heparin gtt per pharmacy  Intermittent CXR Pulmonary hygiene Bed rest until heparin level therapeutic  Will need follow up CT imaging to review RUL pulmonary nodule    CARDIOVASCULAR CVL A:  Sinus Rhythm  CT Evidence of R Heart Strain  P:  Awaiting ECHO. ICU monitoring Trend troponin   RENAL A:   No acute issues  P:   Trend BMP / UOP  Replace electrolytes as indicated   GASTROINTESTINAL A:   No acute issues  P:   Start regular diet.  HEMATOLOGIC A:   Macrocytosis  PE  P:  Trend CBC  Heparin gtt per pharmacy  Daily heparin level   NEUROLOGIC A:   Pain - in setting of PE P:   PRN tylenol for pain  Supportive care   FAMILY  - Updates:  Patient and multiple family members updated at bedside.   - Inter-disciplinary family meet or Palliative Care meeting due by:  9/19.  Critical care time- 35 mins.  Chilton Greathouse MD [facility] Pager [phone] If no answer or after 3pm call: [phone] 12/25/2014, 10:43 AM

## 2014-12-25 NOTE — Procedures (Signed)
Successful completion of 12 hr [place] assisted bilateral pulmonary arterial thrombolysis with significant reduction in main PA pressures. Pre procedural Main PA pressures: 84/30 (mean - 49) Post procedural Main PA pressures: 47/29 (mean - 36) Successful removal of R CFV vascular sheaths.  Hemostasis achieved with manual compression. No immediate post procedural complications.  Katherina Right, MD Pager #: [phone]

## 2014-12-25 NOTE — Progress Notes (Signed)
ANTICOAGULATION CONSULT NOTE - Follow-up Consult  Pharmacy Consult for heparin Indication: pulmonary embolus  No Known Allergies  Patient Measurements: Height: 5\' 11"  (180.3 cm) Weight: 234 lb 2.1 oz (106.2 kg) IBW/kg (Calculated) : 75.3 Heparin Dosing Weight: 96.9kg  Vital Signs: Temp: 99.1 F (37.3 C) (09/13 1938) Temp Source: Oral (09/13 1938) BP: 144/67 mmHg (09/13 2000) Pulse Rate: 70 (09/13 2000)  Labs:  Recent Labs  12/24/14 0900  12/24/14 1912 12/25/14 0218 12/25/14 0542 12/25/14 0808 12/25/14 1408 12/25/14 2005  HGB 14.2  --  14.2 12.4*  --  11.8* 12.2*  --   HCT 40.1  --  41.4 36.5*  --  34.3* 36.1*  --   PLT 131*  --  110* 130*  --  117* 117*  --   APTT  --   --  95*  --   --   --   --   --   LABPROT  --   --  15.8*  --   --   --   --   --   INR  --   --  1.24  --   --   --   --   --   HEPARINUNFRC  --   < >  --  0.78*  --  0.87*  --  0.81*  CREATININE 1.08  --   --   --  0.96  --   --   --   TROPONINI  --   --  0.43* 0.78*  --   --   --   --   < > = values in this interval not displayed.  Estimated Creatinine Clearance: 87.5 mL/min (by C-G formula based on Cr of 0.96).  Assessment: 69 yom on heparin for large saddle PE. EKOS protocol ended and heparin was restarted at 1800 units/hr. Initial HL is 0.81. No bleeding per RN.  Goal of Therapy:  Heparin level 0.3-0.7 units/ml Monitor platelets by anticoagulation protocol: Yes   Plan:  Decrease heparin to 1600 units/hr 0500 HL  Isaac Bliss, PharmD, [facility] Clinical Pharmacist Pager [phone] 12/25/2014 9:27 PM

## 2014-12-25 NOTE — Progress Notes (Signed)
  CRITICAL VALUE ALERT  Critical value received:  Troponin 0.78  Date of notification:  12/25/2014  Time of notification:  0320  Critical value read back:Yes.    Nurse who received alert:  Janna Arch RN   E link called and MD notified at [phone]

## 2014-12-25 NOTE — Progress Notes (Signed)
  Echocardiogram 2D Echocardiogram has been performed.  Leta Jungling M 12/25/2014, 11:27 AM

## 2014-12-26 DIAGNOSIS — Z86711 Personal history of pulmonary embolism: Secondary | ICD-10-CM | POA: Insufficient documentation

## 2014-12-26 LAB — CBC
HEMATOCRIT: 33.9 % — AB (ref 39.0–52.0)
Hemoglobin: 11.4 g/dL — ABNORMAL LOW (ref 13.0–17.0)
MCH: 38.1 pg — ABNORMAL HIGH (ref 26.0–34.0)
MCHC: 33.6 g/dL (ref 30.0–36.0)
MCV: 113.4 fL — AB (ref 78.0–100.0)
Platelets: 103 10*3/uL — ABNORMAL LOW (ref 150–400)
RBC: 2.99 MIL/uL — ABNORMAL LOW (ref 4.22–5.81)
RDW: 13.2 % (ref 11.5–15.5)
WBC: 5.7 10*3/uL (ref 4.0–10.5)

## 2014-12-26 LAB — BASIC METABOLIC PANEL
ANION GAP: 6 (ref 5–15)
BUN: 10 mg/dL (ref 6–20)
CHLORIDE: 108 mmol/L (ref 101–111)
CO2: 23 mmol/L (ref 22–32)
CREATININE: 0.9 mg/dL (ref 0.61–1.24)
Calcium: 8.2 mg/dL — ABNORMAL LOW (ref 8.9–10.3)
GFR calc non Af Amer: >60 mL/min (ref >60)
Glucose, Bld: 148 mg/dL — ABNORMAL HIGH (ref 65–99)
Potassium: 3.8 mmol/L (ref 3.5–5.1)
Sodium: 137 mmol/L (ref 135–145)

## 2014-12-26 LAB — HEPARIN LEVEL (UNFRACTIONATED)
Heparin Unfractionated: 0.51 IU/mL (ref 0.30–0.70)
Heparin Unfractionated: 0.81 IU/mL — ABNORMAL HIGH (ref 0.30–0.70)

## 2014-12-26 MED ORDER — RIVAROXABAN (XARELTO) EDUCATION KIT FOR DVT/PE PATIENTS
PACK | Freq: Once | Status: AC
  Administered 2014-12-26: 10:00:00
  Filled 2014-12-26: qty 1

## 2014-12-26 MED ORDER — RIVAROXABAN 20 MG PO TABS: 20.0000 mg | ORAL_TABLET | Freq: Every day | ORAL | Status: DC

## 2014-12-26 MED ORDER — RIVAROXABAN 15 MG PO TABS
15.0000 mg | ORAL_TABLET | Freq: Two times a day (BID) | ORAL | Status: DC
  Administered 2014-12-26 – 2014-12-27 (×3): 15 mg via ORAL
  Filled 2014-12-26 (×4): qty 1

## 2014-12-26 NOTE — Progress Notes (Signed)
ANTICOAGULATION CONSULT NOTE - Follow Up Consult  Pharmacy Consult for heparin Indication: pulmonary embolus  Labs:  Recent Labs  12/24/14 0900  12/24/14 1912 12/25/14 0218 12/25/14 0542 12/25/14 0808 12/25/14 1408 12/25/14 2005 12/26/14 0217  HGB 14.2  --  14.2 12.4*  --  11.8* 12.2*  --  11.4*  HCT 40.1  --  41.4 36.5*  --  34.3* 36.1*  --  33.9*  PLT 131*  --  110* 130*  --  117* 117*  --  103*  APTT  --   --  95*  --   --   --   --   --   --   LABPROT  --   --  15.8*  --   --   --   --   --   --   INR  --   --  1.24  --   --   --   --   --   --   HEPARINUNFRC  --   < >  --  0.78*  --  0.87*  --  0.81* 0.51  CREATININE 1.08  --   --   --  0.96  --   --   --  0.90  TROPONINI  --   --  0.43* 0.78*  --   --   --   --   --   < > = values in this interval not displayed.   Assessment/Plan:  69yo male therapeutic on heparin after rate change. Will continue gtt at current rate and confirm stable with additional level.   Colleen Can 12/26/2014,4:31 AM

## 2014-12-26 NOTE — Discharge Instructions (Signed)

## 2014-12-26 NOTE — Progress Notes (Signed)
ANTICOAGULATION CONSULT NOTE - Initial Consult  Pharmacy Consult for Xarelto Indication: pulmonary embolus  No Known Allergies  Patient Measurements: Height: 5\' 11"  (180.3 cm) Weight: 233 lb 11 oz (106 kg) IBW/kg (Calculated) : 75.3 Heparin Dosing Weight:   Vital Signs: Temp: 97.9 F (36.6 C) (09/14 0803) Temp Source: Oral (09/14 0803) BP: 170/74 mmHg (09/14 0800) Pulse Rate: 58 (09/14 0800)  Labs:  Recent Labs  12/24/14 0900  12/24/14 1912 12/25/14 0218 12/25/14 0542 12/25/14 0808 12/25/14 1408 12/25/14 2005 12/26/14 0217 12/26/14 0935  HGB 14.2  --  14.2 12.4*  --  11.8* 12.2*  --  11.4*  --   HCT 40.1  --  41.4 36.5*  --  34.3* 36.1*  --  33.9*  --   PLT 131*  --  110* 130*  --  117* 117*  --  103*  --   APTT  --   --  95*  --   --   --   --   --   --   --   LABPROT  --   --  15.8*  --   --   --   --   --   --   --   INR  --   --  1.24  --   --   --   --   --   --   --   HEPARINUNFRC  --   < >  --  0.78*  --  0.87*  --  0.81* 0.51 0.81*  CREATININE 1.08  --   --   --  0.96  --   --   --  0.90  --   TROPONINI  --   --  0.43* 0.78*  --   --   --   --   --   --   < > = values in this interval not displayed.  Estimated Creatinine Clearance: 93.3 mL/min (by C-G formula based on Cr of 0.9).   Medical History: History reviewed. No pertinent past medical history.  Medications:  No prescriptions prior to admission    Assessment: 69 yo male on heparin gtt for saddle PE. Pt underwent EKOS protocol which completed yesterday. HL returned slightly supratherapeutic this morning, heparin was held for ~20 minutes prior to first dose of Xarelto. Heparin will be turned off this morning and pt will begin Xarelto. hgb stable, plts slightly low 103.   Goal of Therapy:  Monitor platelets by anticoagulation protocol: Yes    Plan:  -Stop heparin -Initiate Xarelto 15 mg po bid x21 days, on October 5 change to 20 mg po daily -Monitor s/sx bleeding   Agapito Games, PharmD,  BCPS Clinical Pharmacist Pager: [phone] 12/26/2014 10:21 AM

## 2014-12-26 NOTE — Progress Notes (Signed)
PULMONARY / CRITICAL CARE MEDICINE   Name: Roger Ramirez MRN: [id number] DOB: 1945-10-25    ADMISSION DATE:  12/24/2014 CONSULTATION DATE:  12/24/14  REFERRING MD :  EDP   CHIEF COMPLAINT:  PE / Near Syncope   INITIAL PRESENTATION: 69 y/o M, former remote smoker (quit early 1970) who presented to [facility] ER on 9/12 with 2-3 weeks of weakness, near syncopal episode and progressive SOB.  Work up positive for large/saddle PE.  PCCM consulted for evaluation.    STUDIES:  9/12  CTA Chest >> large saddle PE, nearly occlusive on the R, concern for R heart failure, 6 mm RUL anterior nodule 9/13 Echocardiogram-  RV cavity size is mildly dilated. Systolic function is mildly reduced. RVSP 46.   SIGNIFICANT EVENTS: 9/12  Admit with large PE 9/13- Completed EKOS  HISTORY OF PRESENT ILLNESS:  69 y/o M, former remote smoker (quit early 1970) who presented to [facility] ER on 9/12 with 2-3 weeks of weakness, near syncopal episode and progressive SOB.  n  The patient reports he has no known medical history and is on no medications at baseline.  He is followed by Dr. Nehemiah Settle for primary care.  He reports he is up to date on colonoscopy (was told he would not need another). The patient reports he has noted approximately 2-3 weeks of progressive shortness of breath and weakness.  Am of admission, he walked to the edge of his property ([place]ives [place] to feed the deer and became so weak he felt as though he would pass out.  He laid down on the ground and later was able to walk back to the house.  The patients family noted him to be pale and diaphoretic.  He has noted some lower extremity swelling and chest discomfort.    On arrival to ER, he was documented to be clammy & pale.  Initial lab work noted low CO2 on BMP, elevated glucose at 231, troponin 0.01, BNP 84, WBC 6.7, Hgb 14.2, MCV 112 and platelets of 131.  Given symptoms, D-Dimer was assessed and elevated at 7.19.  Follow up CTA of the Chest demonstrated a large  saddle PE, nearly occlusive on the R, concern for R heart failure, & 6 mm RUL anterior nodule.  The patient denies family history of blood clots, autoimmune disorders, recent travel, and cancer.  The patient does report being sedentary at baseline.  His family calls him "potato" because he sits in front of the TV.  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    PAST MEDICAL HISTORY :   has no past medical history on file.  has no past surgical history on file.   Prior to Admission medications   Not on File   No Known Allergies  FAMILY HISTORY:  has no family status information on file.    SOCIAL HISTORY:  reports that he has quit smoking. His smoking use included Cigarettes. He has a 20 pack-year smoking history. He does not have any smokeless tobacco history on file. He reports that he drinks alcohol. He reports that he does not use illicit drugs.  REVIEW OF SYSTEMS:   Gen: Denies fever, chills, weight change, fatigue, night sweats HEENT: Denies blurred vision, double vision, hearing loss, tinnitus, sinus congestion, rhinorrhea, sore throat, neck stiffness, dysphagia PULM: Denies cough, sputum production, hemoptysis, wheezing.  Reports shortness of breath, chest discomfort.  CV: Denies orthopnea, paroxysmal nocturnal dyspnea, palpitations.  Reports mild LE edema  GI: Denies abdominal pain, nausea, vomiting, diarrhea, hematochezia, melena, constipation, change in bowel habits GU: Denies dysuria, hematuria, polyuria, oliguria, urethral discharge Endocrine: Denies hot or cold intolerance, polyuria, polyphagia or appetite change Derm: Denies rash, dry skin, scaling or peeling skin change Heme: Denies easy bruising, bleeding, bleeding gums Neuro: Denies headache, numbness, weakness, slurred speech, loss of memory or consciousness   SUBJECTIVE:   VITAL SIGNS: Temp:  [97.4 F (36.3 C)-99.1 F (37.3 C)] 97.9 F (36.6 C) (09/14 0803) Pulse Rate:   [58-83] 58 (09/14 0800) Resp:  [17-29] 18 (09/14 0800) BP: (120-189)/(53-119) 170/74 mmHg (09/14 0800) SpO2:  [88 %-100 %] 97 % (09/14 0800) Weight:  [233 lb 11 oz (106 kg)] 233 lb 11 oz (106 kg) (09/14 0500)   HEMODYNAMICS:     VENTILATOR SETTINGS:     INTAKE / OUTPUT:  Intake/Output Summary (Last 24 hours) at 12/26/14 0946 Last data filed at 12/26/14 0803  Gross per 24 hour  Intake   1074 ml  Output   1126 ml  Net    -52 ml    PHYSICAL EXAMINATION: General:  Elderly male. No distress Neuro:  Awake, Oriented X 3 HEENT:  Moist MM Cardiovascular:  S1, S2, No MRG. Lungs: Clear, No wheeze or crackles.  Abdomen:  Soft, + BS Skin:  Warm/dry, no edema   LABS:  CBC  Recent Labs Lab 12/25/14 0808 12/25/14 1408 12/26/14 0217  WBC 5.9 5.5 5.7  HGB 11.8* 12.2* 11.4*  HCT 34.3* 36.1* 33.9*  PLT 117* 117* 103*   Coag's  Recent Labs Lab 12/24/14 1912  APTT 95*  INR 1.24     BMET  Recent Labs Lab 12/24/14 0900 12/25/14 0542 12/26/14 0217  NA 135 139 137  K 3.8 4.5 3.8  CL 106 112* 108  CO2 17* 22 23  BUN CREATININE 1.08 0.96 0.90  GLUCOSE 231* 141* 148*   Electrolytes  Recent Labs Lab 12/24/14 0900 12/25/14 0542 12/26/14 0217  CALCIUM 8.9 8.1* 8.2*   Sepsis Markers No results for input(s): LATICACIDVEN, PROCALCITON, O2SATVEN in the last 168 hours.   ABG No results for input(s): PHART, PCO2ART, PO2ART in the last 168 hours.   Liver Enzymes No results for input(s): AST, ALT, ALKPHOS, BILITOT, ALBUMIN in the last 168 hours.   Cardiac Enzymes  Recent Labs Lab 12/24/14 1912 12/25/14 0218  TROPONINI 0.43* 0.78*     Glucose  Recent Labs Lab 12/24/14 1130 12/24/14 1455  GLUCAP 160* 209*    Imaging    ASSESSMENT / PLAN:  PULMONARY OETT A: Large Bilateral / Saddle PE - with near syncope event, unprovoked. Rt DVT. S/p EKOS 6mm Anterior RUL Pulmonary Nodule  Former Smoker - remote, quit in 1970's P:   Heparin gtt  per pharmacy. Transition to Xarelto.  Intermittent CXR Pulmonary hygiene Will need follow up CT imaging to review RUL pulmonary nodule    CARDIOVASCULAR CVL A:  Sinus Rhythm  CT Evidence of R Heart Strain  P:  Continue tele monitoring.   RENAL A:   No acute issues  P:   Trend BMP / UOP  Replace electrolytes as indicated   GASTROINTESTINAL A:   No acute issues  P:   Start regular diet.  HEMATOLOGIC A:   Macrocytosis  PE  P:  Trend CBC  Heparin gtt per pharmacy  Daily heparin level   NEUROLOGIC A:   Pain - in setting of PE P:   PRN tylenol for pain  Supportive  care   FAMILY  - Updates:  Patient and multiple family members updated at bedside.   - Inter-disciplinary family meet or Palliative Care meeting due by: 9/19.   Chilton Greathouse MD [facility] Pager [phone] If no answer or after 3pm call: [phone] 12/26/2014, 9:46 AM

## 2014-12-26 NOTE — Care Management Note (Signed)
Case Management Note  Patient Details  Name: Gilmar Mucklow MRN: [id number] Date of Birth: 03-Feb-1946  Subjective/Objective:    Plan to place patient on xaralto.  Pharm D gave patient free 30 day card and 0 copay card.  Benefits checked -     Pt copay will be $45- prior Berkley Harvey is required [phone].             Action/Plan:   Expected Discharge Date:                  Expected Discharge Plan:  Home/Self Care  In-House Referral:     Discharge planning Services  CM Consult, Medication Assistance  Post Acute Care Choice:    Choice offered to:     DME Arranged:    DME Agency:     HH Arranged:    HH Agency:     Status of Service:  In process, will continue to follow  Medicare Important Message Given:    Date Medicare IM Given:    Medicare IM give by:    Date Additional Medicare IM Given:    Additional Medicare Important Message give by:     If discussed at Long Length of Stay Meetings, dates discussed:    Additional Comments:  Vangie Bicker, RN 12/26/2014, 12:12 PM

## 2014-12-26 NOTE — Progress Notes (Signed)
Referring Physician(s): Dr. Vassie Loll  Chief Complaint:  Pulmonary Embolism S/P PE lysis by Dr. Grace Isaac 12/24/14  Subjective:  Roger Ramirez is doing well. He states he is breathing much better. He is still on 2 liters of O2 via nasal cannula, but he tells me he removed it last night and he "did just fine" He wants to get up and walk around and states "they haven't let me out of bed yet"  Allergies: Review of patient's allergies indicates no known allergies.  Medications: Prior to Admission medications   Not on File     Vital Signs: BP 170/74 mmHg  Pulse 58  Temp(Src) 98.3 F (36.8 C) (Oral)  Resp 18  Ht 5\' 11"  (1.803 m)  Wt 233 lb 11 oz (106 kg)  BMI 32.61 kg/m2  SpO2 97%  Physical Exam  Awake and Alert Lungs clear bilaterally Heart RRR Right groin stick OK. No hematoma.  Imaging: Dg Chest 2 View  12/24/2014   CLINICAL DATA:  Chest pain and shortness of breath for 2 weeks.  EXAM: CHEST  2 VIEW  COMPARISON:  None.  FINDINGS: Upper limits normal heart size noted.  There may be trace bilateral pleural effusions present.  There is no evidence of focal airspace disease, pulmonary edema, suspicious pulmonary nodule/mass, or pneumothorax.  No acute bony abnormalities are identified.  IMPRESSION: Upper limits normal heart size and question trace bilateral pleural effusions.   Electronically Signed   By: Harmon Pier M.D.   On: 12/24/2014 09:53   Ct Angio Chest Pe W/cm &/or Wo Cm  12/24/2014   CLINICAL DATA:  Progressive shortness breath for 2 weeks.  EXAM: CT ANGIOGRAPHY CHEST WITH CONTRAST  TECHNIQUE: Multidetector CT imaging of the chest was performed using the standard protocol during bolus administration of intravenous contrast. Multiplanar CT image reconstructions and MIPs were obtained to evaluate the vascular anatomy.  CONTRAST:  OMNIPAQUE IOHEXOL 350 MG/ML SOLN  COMPARISON:  None.  FINDINGS: There is large acute saddle pulmonary embolus, nearly occlusive within the right  main pulmonary artery, with extension into the bilateral inter-lobar pulmonary arteries and multiple segmental and subsegmental pulmonary artery branches involving all lobes.  Right heart is somewhat prominent suggesting impending heart failure. The inferior vena cava appears similarly distended. Overall heart size is upper normal. No pericardial effusion. Thoracic aorta is normal in caliber. No aortic dissection. Atherosclerotic calcifications noted along the walls of the thoracic aorta.  There is mild dependent atelectasis at each lung base. Lungs otherwise clear. No pleural effusion. No pneumothorax. 6 mm pulmonary nodule noted within the right upper lobe anteriorly (series 7, image 44). Trachea and central bronchi are unremarkable. Limited images of the upper abdomen are unremarkable.  Review of the MIP images confirms the above findings.  IMPRESSION: 1. Large saddle pulmonary embolus, nearly occlusive within the right main pulmonary artery, extending into the bilateral central interlobar pulmonary arteries and into numerous segmental and subsegmental pulmonary artery branches involving all lobes. 2. Somewhat prominent right heart size and inferior vena cava raising the possibility of impending right heart failure. 3. Overall heart size is upper normal.  No pericardial effusion. 4. No aortic aneurysm or dissection. 5. No pulmonary consolidation or pleural effusion. 6. 6 mm pulmonary nodule within the right upper lobe anteriorly. If the patient is at high risk for bronchogenic carcinoma, follow-up chest CT at 6-12 months is recommended. If the patient is at low risk for bronchogenic carcinoma, follow-up chest CT at 12 months is recommended. This  recommendation follows the consensus statement: Guidelines for Management of Small Pulmonary Nodules Detected on CT Scans: A Statement from the Fleischner Society as published in Radiology 2005;237:395-400. These results were called by telephone at the time of  interpretation on 12/24/2014 at 11:43 am to Dr. Bary Castilla , who was unavailable. Findings discussed with the charge nurse, Efraim Kaufmann, who acknowledged the results.   Electronically Signed   By: Bary Richard M.D.   On: 12/24/2014 11:51   Ir Angiogram Pulmonary Bilateral Selective  12/24/2014   INDICATION: History of syncopal episode, found to have large burden bilateral / saddle pulmonary embolism with an abnormal right to left ventricular ratio on chest CTA.  Request made for initiation of ultrasound assisted catheter directed bilateral pulmonary arterial thrombolysis for the purposes of expeditious clot reduction and decreased right ventricular strain.  EXAM: 1. ULTRASOUND GUIDANCE FOR VENOUS ACCESS X2 2. BILATERAL PULMONARY ARTERIOGRAPHY 3. FLUOROSCOPIC GUIDED PLACEMENT OF BILATERAL PULMONARY ARTERIAL LYTIC INFUSION CATHETERS  COMPARISON:  Chest CTA - earlier same day  MEDICATIONS: Versed 2 mg IV; Fentanyl 100 mcg IV  Sedation time: 60 minutes  CONTRAST:  15mL OMNIPAQUE IOHEXOL 300 MG/ML  SOLN  FLUOROSCOPY TIME:  6 minutes 12 seconds (292 mGy)  COMPLICATIONS: None immediate  TECHNIQUE: Informed written consent was obtained from the patient after a discussion of the risks, benefits and alternatives to treatment. Questions regarding the procedure were encouraged and answered. A timeout was performed prior to the initiation of the procedure.  Ultrasound scanning was performed of the right groin in demonstrate wide patency of the right common femoral vein. As such, the right groin was prepped and draped in the usual sterile fashion, and a sterile drape was applied covering the operative field. Maximum barrier sterile technique with sterile gowns and gloves were used for the procedure. A timeout was performed prior to the initiation of the procedure. Local anesthesia was provided with 1% lidocaine.  Under direct ultrasound guidance, the right common femoral vein was accessed with a micro puncture sheath  ultimately allowing placement of a 6 French vascular sheath. Slightly cranial to this initial access, the right internal jugular vein was again accessed with a micropuncture sheath ultimately allowing placement of an additional 6 French vascular sheath.  With the use of a glidewire, a vertebral catheter was advanced into the left main pulmonary artery and a limited left pulmonary arteriogram was performed with a hand injection.  Pressure measurements present obtained at the level of the main pulmonary artery.  Over an exchange length Rosen wire, the pigtail catheter was exchanged for a 105/12 cm multi side-hole EKOS ultrasound assisted infusion catheter.  With the use of a glidewire, a pigtail catheter was advanced into the right main pulmonary artery and a limited left pulmonary arteriogram was performed with a hand injection. Over an exchange length Rosen wire, the pigtail catheter was exchanged for a 105/18 cm multi side-hole EKOS ultrasound assisted infusion catheter.  A postprocedural fluoroscopic image was obtained of the check demonstrating final catheter positioning.  Both vascular sheath were secured at the right neck with interrupted 0 Prolene sutures. The external catheter tubing was secured at the right chest and the lytic therapy was initiated.  The patient tolerated the procedure well without immediate postprocedural complication.  FINDINGS: Limited pulmonary arteriograms demonstrates a moderate to large amount of nonocclusive pulmonary embolism within the distal aspects of both the right and left main pulmonary arteries, similar, similar to preceding chest CTA. Both the right and left pulmonary arteries are  noted to be markedly enlarged.  Acquired pressure measurements:  Main pulmonary artery - 84/30; mean - 49 (normal: < 25/10)  Following the procedure, both ultrasound assisted infusion catheter tips terminate within the distal aspects of the bilateral lower lobe sub segmental pulmonary arteries.   IMPRESSION: 1. Successful fluoroscopic guided initiation of bilateral ultrasound assisted catheter directed pulmonary arterial lysis for sub massive pulmonary embolism and right-sided heart strain. 2. Markedly elevated pressure measurements within the right main pulmonary artery compatible with critical pulmonary arterial hypertension.  PLAN: The patient will return to the interventional radiology suite approximately 12 hours following the initiation of the catheter directed pulmonary arterial lysis for repeat pressure measurements and either removal of the catheters or continuation of the catheter directed thrombolysis.   Electronically Signed   By: Simonne Come M.D.   On: 12/24/2014 17:12   Ir Angiogram Selective Each Additional Vessel  12/24/2014   INDICATION: History of syncopal episode, found to have large burden bilateral / saddle pulmonary embolism with an abnormal right to left ventricular ratio on chest CTA.  Request made for initiation of ultrasound assisted catheter directed bilateral pulmonary arterial thrombolysis for the purposes of expeditious clot reduction and decreased right ventricular strain.  EXAM: 1. ULTRASOUND GUIDANCE FOR VENOUS ACCESS X2 2. BILATERAL PULMONARY ARTERIOGRAPHY 3. FLUOROSCOPIC GUIDED PLACEMENT OF BILATERAL PULMONARY ARTERIAL LYTIC INFUSION CATHETERS  COMPARISON:  Chest CTA - earlier same day  MEDICATIONS: Versed 2 mg IV; Fentanyl 100 mcg IV  Sedation time: 60 minutes  CONTRAST:  90mL OMNIPAQUE IOHEXOL 300 MG/ML  SOLN  FLUOROSCOPY TIME:  6 minutes 12 seconds (292 mGy)  COMPLICATIONS: None immediate  TECHNIQUE: Informed written consent was obtained from the patient after a discussion of the risks, benefits and alternatives to treatment. Questions regarding the procedure were encouraged and answered. A timeout was performed prior to the initiation of the procedure.  Ultrasound scanning was performed of the right groin in demonstrate wide patency of the right common femoral vein.  As such, the right groin was prepped and draped in the usual sterile fashion, and a sterile drape was applied covering the operative field. Maximum barrier sterile technique with sterile gowns and gloves were used for the procedure. A timeout was performed prior to the initiation of the procedure. Local anesthesia was provided with 1% lidocaine.  Under direct ultrasound guidance, the right common femoral vein was accessed with a micro puncture sheath ultimately allowing placement of a 6 French vascular sheath. Slightly cranial to this initial access, the right internal jugular vein was again accessed with a micropuncture sheath ultimately allowing placement of an additional 6 French vascular sheath.  With the use of a glidewire, a vertebral catheter was advanced into the left main pulmonary artery and a limited left pulmonary arteriogram was performed with a hand injection.  Pressure measurements present obtained at the level of the main pulmonary artery.  Over an exchange length Rosen wire, the pigtail catheter was exchanged for a 105/12 cm multi side-hole EKOS ultrasound assisted infusion catheter.  With the use of a glidewire, a pigtail catheter was advanced into the right main pulmonary artery and a limited left pulmonary arteriogram was performed with a hand injection. Over an exchange length Rosen wire, the pigtail catheter was exchanged for a 105/18 cm multi side-hole EKOS ultrasound assisted infusion catheter.  A postprocedural fluoroscopic image was obtained of the check demonstrating final catheter positioning.  Both vascular sheath were secured at the right neck with interrupted 0 Prolene sutures. The external  catheter tubing was secured at the right chest and the lytic therapy was initiated.  The patient tolerated the procedure well without immediate postprocedural complication.  FINDINGS: Limited pulmonary arteriograms demonstrates a moderate to large amount of nonocclusive pulmonary embolism within the  distal aspects of both the right and left main pulmonary arteries, similar, similar to preceding chest CTA. Both the right and left pulmonary arteries are noted to be markedly enlarged.  Acquired pressure measurements:  Main pulmonary artery - 84/30; mean - 49 (normal: < 25/10)  Following the procedure, both ultrasound assisted infusion catheter tips terminate within the distal aspects of the bilateral lower lobe sub segmental pulmonary arteries.  IMPRESSION: 1. Successful fluoroscopic guided initiation of bilateral ultrasound assisted catheter directed pulmonary arterial lysis for sub massive pulmonary embolism and right-sided heart strain. 2. Markedly elevated pressure measurements within the right main pulmonary artery compatible with critical pulmonary arterial hypertension.  PLAN: The patient will return to the interventional radiology suite approximately 12 hours following the initiation of the catheter directed pulmonary arterial lysis for repeat pressure measurements and either removal of the catheters or continuation of the catheter directed thrombolysis.   Electronically Signed   By: Simonne Come M.D.   On: 12/24/2014 17:12   Ir Angiogram Selective Each Additional Vessel  12/24/2014   INDICATION: History of syncopal episode, found to have large burden bilateral / saddle pulmonary embolism with an abnormal right to left ventricular ratio on chest CTA.  Request made for initiation of ultrasound assisted catheter directed bilateral pulmonary arterial thrombolysis for the purposes of expeditious clot reduction and decreased right ventricular strain.  EXAM: 1. ULTRASOUND GUIDANCE FOR VENOUS ACCESS X2 2. BILATERAL PULMONARY ARTERIOGRAPHY 3. FLUOROSCOPIC GUIDED PLACEMENT OF BILATERAL PULMONARY ARTERIAL LYTIC INFUSION CATHETERS  COMPARISON:  Chest CTA - earlier same day  MEDICATIONS: Versed 2 mg IV; Fentanyl 100 mcg IV  Sedation time: 60 minutes  CONTRAST:  15mL OMNIPAQUE IOHEXOL 300 MG/ML  SOLN  FLUOROSCOPY  TIME:  6 minutes 12 seconds (292 mGy)  COMPLICATIONS: None immediate  TECHNIQUE: Informed written consent was obtained from the patient after a discussion of the risks, benefits and alternatives to treatment. Questions regarding the procedure were encouraged and answered. A timeout was performed prior to the initiation of the procedure.  Ultrasound scanning was performed of the right groin in demonstrate wide patency of the right common femoral vein. As such, the right groin was prepped and draped in the usual sterile fashion, and a sterile drape was applied covering the operative field. Maximum barrier sterile technique with sterile gowns and gloves were used for the procedure. A timeout was performed prior to the initiation of the procedure. Local anesthesia was provided with 1% lidocaine.  Under direct ultrasound guidance, the right common femoral vein was accessed with a micro puncture sheath ultimately allowing placement of a 6 French vascular sheath. Slightly cranial to this initial access, the right internal jugular vein was again accessed with a micropuncture sheath ultimately allowing placement of an additional 6 French vascular sheath.  With the use of a glidewire, a vertebral catheter was advanced into the left main pulmonary artery and a limited left pulmonary arteriogram was performed with a hand injection.  Pressure measurements present obtained at the level of the main pulmonary artery.  Over an exchange length Rosen wire, the pigtail catheter was exchanged for a 105/12 cm multi side-hole EKOS ultrasound assisted infusion catheter.  With the use of a glidewire, a pigtail catheter was advanced into the right main  pulmonary artery and a limited left pulmonary arteriogram was performed with a hand injection. Over an exchange length Rosen wire, the pigtail catheter was exchanged for a 105/18 cm multi side-hole EKOS ultrasound assisted infusion catheter.  A postprocedural fluoroscopic image was obtained of  the check demonstrating final catheter positioning.  Both vascular sheath were secured at the right neck with interrupted 0 Prolene sutures. The external catheter tubing was secured at the right chest and the lytic therapy was initiated.  The patient tolerated the procedure well without immediate postprocedural complication.  FINDINGS: Limited pulmonary arteriograms demonstrates a moderate to large amount of nonocclusive pulmonary embolism within the distal aspects of both the right and left main pulmonary arteries, similar, similar to preceding chest CTA. Both the right and left pulmonary arteries are noted to be markedly enlarged.  Acquired pressure measurements:  Main pulmonary artery - 84/30; mean - 49 (normal: < 25/10)  Following the procedure, both ultrasound assisted infusion catheter tips terminate within the distal aspects of the bilateral lower lobe sub segmental pulmonary arteries.  IMPRESSION: 1. Successful fluoroscopic guided initiation of bilateral ultrasound assisted catheter directed pulmonary arterial lysis for sub massive pulmonary embolism and right-sided heart strain. 2. Markedly elevated pressure measurements within the right main pulmonary artery compatible with critical pulmonary arterial hypertension.  PLAN: The patient will return to the interventional radiology suite approximately 12 hours following the initiation of the catheter directed pulmonary arterial lysis for repeat pressure measurements and either removal of the catheters or continuation of the catheter directed thrombolysis.   Electronically Signed   By: Simonne Come M.D.   On: 12/24/2014 17:12   Ir US Guide Vasc Access Right  12/24/2014   INDICATION: History of syncopal episode, found to have large burden bilateral / saddle pulmonary embolism with an abnormal right to left ventricular ratio on chest CTA.  Request made for initiation of ultrasound assisted catheter directed bilateral pulmonary arterial thrombolysis for the  purposes of expeditious clot reduction and decreased right ventricular strain.  EXAM: 1. ULTRASOUND GUIDANCE FOR VENOUS ACCESS X2 2. BILATERAL PULMONARY ARTERIOGRAPHY 3. FLUOROSCOPIC GUIDED PLACEMENT OF BILATERAL PULMONARY ARTERIAL LYTIC INFUSION CATHETERS  COMPARISON:  Chest CTA - earlier same day  MEDICATIONS: Versed 2 mg IV; Fentanyl 100 mcg IV  Sedation time: 60 minutes  CONTRAST:  15mL OMNIPAQUE IOHEXOL 300 MG/ML  SOLN  FLUOROSCOPY TIME:  6 minutes 12 seconds (292 mGy)  COMPLICATIONS: None immediate  TECHNIQUE: Informed written consent was obtained from the patient after a discussion of the risks, benefits and alternatives to treatment. Questions regarding the procedure were encouraged and answered. A timeout was performed prior to the initiation of the procedure.  Ultrasound scanning was performed of the right groin in demonstrate wide patency of the right common femoral vein. As such, the right groin was prepped and draped in the usual sterile fashion, and a sterile drape was applied covering the operative field. Maximum barrier sterile technique with sterile gowns and gloves were used for the procedure. A timeout was performed prior to the initiation of the procedure. Local anesthesia was provided with 1% lidocaine.  Under direct ultrasound guidance, the right common femoral vein was accessed with a micro puncture sheath ultimately allowing placement of a 6 French vascular sheath. Slightly cranial to this initial access, the right internal jugular vein was again accessed with a micropuncture sheath ultimately allowing placement of an additional 6 French vascular sheath.  With the use of a glidewire, a vertebral catheter was advanced into the left  main pulmonary artery and a limited left pulmonary arteriogram was performed with a hand injection.  Pressure measurements present obtained at the level of the main pulmonary artery.  Over an exchange length Rosen wire, the pigtail catheter was exchanged for a  105/12 cm multi side-hole EKOS ultrasound assisted infusion catheter.  With the use of a glidewire, a pigtail catheter was advanced into the right main pulmonary artery and a limited left pulmonary arteriogram was performed with a hand injection. Over an exchange length Rosen wire, the pigtail catheter was exchanged for a 105/18 cm multi side-hole EKOS ultrasound assisted infusion catheter.  A postprocedural fluoroscopic image was obtained of the check demonstrating final catheter positioning.  Both vascular sheath were secured at the right neck with interrupted 0 Prolene sutures. The external catheter tubing was secured at the right chest and the lytic therapy was initiated.  The patient tolerated the procedure well without immediate postprocedural complication.  FINDINGS: Limited pulmonary arteriograms demonstrates a moderate to large amount of nonocclusive pulmonary embolism within the distal aspects of both the right and left main pulmonary arteries, similar, similar to preceding chest CTA. Both the right and left pulmonary arteries are noted to be markedly enlarged.  Acquired pressure measurements:  Main pulmonary artery - 84/30; mean - 49 (normal: < 25/10)  Following the procedure, both ultrasound assisted infusion catheter tips terminate within the distal aspects of the bilateral lower lobe sub segmental pulmonary arteries.  IMPRESSION: 1. Successful fluoroscopic guided initiation of bilateral ultrasound assisted catheter directed pulmonary arterial lysis for sub massive pulmonary embolism and right-sided heart strain. 2. Markedly elevated pressure measurements within the right main pulmonary artery compatible with critical pulmonary arterial hypertension.  PLAN: The patient will return to the interventional radiology suite approximately 12 hours following the initiation of the catheter directed pulmonary arterial lysis for repeat pressure measurements and either removal of the catheters or continuation of  the catheter directed thrombolysis.   Electronically Signed   By: Simonne Come M.D.   On: 12/24/2014 17:12   Ir Infusion Thrombol Arterial Initial (ms)  12/24/2014   INDICATION: History of syncopal episode, found to have large burden bilateral / saddle pulmonary embolism with an abnormal right to left ventricular ratio on chest CTA.  Request made for initiation of ultrasound assisted catheter directed bilateral pulmonary arterial thrombolysis for the purposes of expeditious clot reduction and decreased right ventricular strain.  EXAM: 1. ULTRASOUND GUIDANCE FOR VENOUS ACCESS X2 2. BILATERAL PULMONARY ARTERIOGRAPHY 3. FLUOROSCOPIC GUIDED PLACEMENT OF BILATERAL PULMONARY ARTERIAL LYTIC INFUSION CATHETERS  COMPARISON:  Chest CTA - earlier same day  MEDICATIONS: Versed 2 mg IV; Fentanyl 100 mcg IV  Sedation time: 60 minutes  CONTRAST:  15mL OMNIPAQUE IOHEXOL 300 MG/ML  SOLN  FLUOROSCOPY TIME:  6 minutes 12 seconds (292 mGy)  COMPLICATIONS: None immediate  TECHNIQUE: Informed written consent was obtained from the patient after a discussion of the risks, benefits and alternatives to treatment. Questions regarding the procedure were encouraged and answered. A timeout was performed prior to the initiation of the procedure.  Ultrasound scanning was performed of the right groin in demonstrate wide patency of the right common femoral vein. As such, the right groin was prepped and draped in the usual sterile fashion, and a sterile drape was applied covering the operative field. Maximum barrier sterile technique with sterile gowns and gloves were used for the procedure. A timeout was performed prior to the initiation of the procedure. Local anesthesia was provided with 1% lidocaine.  Under  direct ultrasound guidance, the right common femoral vein was accessed with a micro puncture sheath ultimately allowing placement of a 6 French vascular sheath. Slightly cranial to this initial access, the right internal jugular vein was  again accessed with a micropuncture sheath ultimately allowing placement of an additional 6 French vascular sheath.  With the use of a glidewire, a vertebral catheter was advanced into the left main pulmonary artery and a limited left pulmonary arteriogram was performed with a hand injection.  Pressure measurements present obtained at the level of the main pulmonary artery.  Over an exchange length Rosen wire, the pigtail catheter was exchanged for a 105/12 cm multi side-hole EKOS ultrasound assisted infusion catheter.  With the use of a glidewire, a pigtail catheter was advanced into the right main pulmonary artery and a limited left pulmonary arteriogram was performed with a hand injection. Over an exchange length Rosen wire, the pigtail catheter was exchanged for a 105/18 cm multi side-hole EKOS ultrasound assisted infusion catheter.  A postprocedural fluoroscopic image was obtained of the check demonstrating final catheter positioning.  Both vascular sheath were secured at the right neck with interrupted 0 Prolene sutures. The external catheter tubing was secured at the right chest and the lytic therapy was initiated.  The patient tolerated the procedure well without immediate postprocedural complication.  FINDINGS: Limited pulmonary arteriograms demonstrates a moderate to large amount of nonocclusive pulmonary embolism within the distal aspects of both the right and left main pulmonary arteries, similar, similar to preceding chest CTA. Both the right and left pulmonary arteries are noted to be markedly enlarged.  Acquired pressure measurements:  Main pulmonary artery - 84/30; mean - 49 (normal: < 25/10)  Following the procedure, both ultrasound assisted infusion catheter tips terminate within the distal aspects of the bilateral lower lobe sub segmental pulmonary arteries.  IMPRESSION: 1. Successful fluoroscopic guided initiation of bilateral ultrasound assisted catheter directed pulmonary arterial lysis for sub  massive pulmonary embolism and right-sided heart strain. 2. Markedly elevated pressure measurements within the right main pulmonary artery compatible with critical pulmonary arterial hypertension.  PLAN: The patient will return to the interventional radiology suite approximately 12 hours following the initiation of the catheter directed pulmonary arterial lysis for repeat pressure measurements and either removal of the catheters or continuation of the catheter directed thrombolysis.   Electronically Signed   By: Simonne Come M.D.   On: 12/24/2014 17:12   Ir Infusion Thrombol Arterial Initial (ms)  12/24/2014   INDICATION: History of syncopal episode, found to have large burden bilateral / saddle pulmonary embolism with an abnormal right to left ventricular ratio on chest CTA.  Request made for initiation of ultrasound assisted catheter directed bilateral pulmonary arterial thrombolysis for the purposes of expeditious clot reduction and decreased right ventricular strain.  EXAM: 1. ULTRASOUND GUIDANCE FOR VENOUS ACCESS X2 2. BILATERAL PULMONARY ARTERIOGRAPHY 3. FLUOROSCOPIC GUIDED PLACEMENT OF BILATERAL PULMONARY ARTERIAL LYTIC INFUSION CATHETERS  COMPARISON:  Chest CTA - earlier same day  MEDICATIONS: Versed 2 mg IV; Fentanyl 100 mcg IV  Sedation time: 60 minutes  CONTRAST:  15mL OMNIPAQUE IOHEXOL 300 MG/ML  SOLN  FLUOROSCOPY TIME:  6 minutes 12 seconds (292 mGy)  COMPLICATIONS: None immediate  TECHNIQUE: Informed written consent was obtained from the patient after a discussion of the risks, benefits and alternatives to treatment. Questions regarding the procedure were encouraged and answered. A timeout was performed prior to the initiation of the procedure.  Ultrasound scanning was performed of the right groin in  demonstrate wide patency of the right common femoral vein. As such, the right groin was prepped and draped in the usual sterile fashion, and a sterile drape was applied covering the operative field.  Maximum barrier sterile technique with sterile gowns and gloves were used for the procedure. A timeout was performed prior to the initiation of the procedure. Local anesthesia was provided with 1% lidocaine.  Under direct ultrasound guidance, the right common femoral vein was accessed with a micro puncture sheath ultimately allowing placement of a 6 French vascular sheath. Slightly cranial to this initial access, the right internal jugular vein was again accessed with a micropuncture sheath ultimately allowing placement of an additional 6 French vascular sheath.  With the use of a glidewire, a vertebral catheter was advanced into the left main pulmonary artery and a limited left pulmonary arteriogram was performed with a hand injection.  Pressure measurements present obtained at the level of the main pulmonary artery.  Over an exchange length Rosen wire, the pigtail catheter was exchanged for a 105/12 cm multi side-hole EKOS ultrasound assisted infusion catheter.  With the use of a glidewire, a pigtail catheter was advanced into the right main pulmonary artery and a limited left pulmonary arteriogram was performed with a hand injection. Over an exchange length Rosen wire, the pigtail catheter was exchanged for a 105/18 cm multi side-hole EKOS ultrasound assisted infusion catheter.  A postprocedural fluoroscopic image was obtained of the check demonstrating final catheter positioning.  Both vascular sheath were secured at the right neck with interrupted 0 Prolene sutures. The external catheter tubing was secured at the right chest and the lytic therapy was initiated.  The patient tolerated the procedure well without immediate postprocedural complication.  FINDINGS: Limited pulmonary arteriograms demonstrates a moderate to large amount of nonocclusive pulmonary embolism within the distal aspects of both the right and left main pulmonary arteries, similar, similar to preceding chest CTA. Both the right and left  pulmonary arteries are noted to be markedly enlarged.  Acquired pressure measurements:  Main pulmonary artery - 84/30; mean - 49 (normal: < 25/10)  Following the procedure, both ultrasound assisted infusion catheter tips terminate within the distal aspects of the bilateral lower lobe sub segmental pulmonary arteries.  IMPRESSION: 1. Successful fluoroscopic guided initiation of bilateral ultrasound assisted catheter directed pulmonary arterial lysis for sub massive pulmonary embolism and right-sided heart strain. 2. Markedly elevated pressure measurements within the right main pulmonary artery compatible with critical pulmonary arterial hypertension.  PLAN: The patient will return to the interventional radiology suite approximately 12 hours following the initiation of the catheter directed pulmonary arterial lysis for repeat pressure measurements and either removal of the catheters or continuation of the catheter directed thrombolysis.   Electronically Signed   By: Simonne Come M.D.   On: 12/24/2014 17:12   Ir Rande Lawman F/u Eval Art/ven Final Day (ms)  12/25/2014   INDICATION: History of sub massive pulmonary embolism, post initiation of ultrasound assisted bilateral pulmonary arterial thrombolysis on 12/24/2014.  The patient returns today for post arterial thrombolysis pulmonary arterial pressure measurements. The patient reports subjective improvement in his preprocedural shortness of breath.  EXAM: IR THROMB F/U EVAL ART/VEN FINAL DAY  COMPARISON:  Ultrasound fluoroscopic guided initiation of bilateral ultrasound assisted pulmonary arterial thrombolysis -12/24/2014; chest CTA - 12/23/2024  MEDICATIONS: None.  CONTRAST:  None  FLUOROSCOPY TIME:  None  COMPLICATIONS: None immediate  TECHNIQUE: The right EKOS infusion wire was removed and the catheter was connected to a pressure measurement device.  The right pulmonary arterial infusion catheter was retracted to the level of the right main pulmonary artery and pressure  measurements were obtained.  Measurements were reviewed and the procedure was terminated. All wires catheters and sheaths were removed from the patient. Hemostasis was achieved at the right groin access site with manual compression. Dressings were placed. The patient tolerated the procedure well without immediate postprocedural complication.  FINDINGS: Significant reduction in patient's main pulmonary arterial pressure measurements as follows:  Preprocedural main pulmonary artery pressure measurements: 84/30 (mean - 49).  Postprocedural mean pulmonary arterial pressure measurements: 47/29 (mean - 36).  IMPRESSION: Technically successful bilateral ultrasound assisted pulmonary arterial thrombolysis with significant reduction in patient's pulmonary arterial pressure measurements.   Electronically Signed   By: Simonne Come M.D.   On: 12/25/2014 15:20    Labs:  CBC:  Recent Labs  12/25/14 0218 12/25/14 0808 12/25/14 1408 12/26/14 0217  WBC 6.8 5.9 5.5 5.7  HGB 12.4* 11.8* 12.2* 11.4*  HCT 36.5* 34.3* 36.1* 33.9*  PLT 130* 117* 117* 103*    COAGS:  Recent Labs  12/24/14 1912  INR 1.24  APTT 95*    BMP:  Recent Labs  12/24/14 0900 12/25/14 0542 12/26/14 0217  NA 135 139 137  K 3.8 4.5 3.8  CL 106 112* 108  CO2 17* 22 23  GLUCOSE 231* 141* 148*  BUN CALCIUM 8.9 8.1* 8.2*  CREATININE 1.08 0.96 0.90  GFRNONAA >60 >60 >60  GFRAA >60 >60 >60    LIVER FUNCTION TESTS: No results for input(s): BILITOT, AST, ALT, ALKPHOS, PROT, ALBUMIN in the last 8760 hours.  Assessment and Plan:  Massive Pulmonary Embolisms S/P PE lysis by Dr. Grace Isaac Can be out of bed and ambulate OK to shower and cleanse right groin stick site. No need for any dressings. Agree with Xarelto Will sign off.  Signed: Gwynneth Macleod PA-C 12/26/2014, 12:22 PM   I spent a total of 15 Minutes at the the patient's bedside AND on the patient's hospital floor or unit, greater than 50% of which was  counseling/coordinating care for post op PE lysis.

## 2014-12-27 DIAGNOSIS — R0781 Pleurodynia: Secondary | ICD-10-CM

## 2014-12-27 DIAGNOSIS — I82401 Acute embolism and thrombosis of unspecified deep veins of right lower extremity: Secondary | ICD-10-CM

## 2014-12-27 DIAGNOSIS — R55 Syncope and collapse: Secondary | ICD-10-CM | POA: Insufficient documentation

## 2014-12-27 DIAGNOSIS — I2699 Other pulmonary embolism without acute cor pulmonale: Secondary | ICD-10-CM

## 2014-12-27 DIAGNOSIS — I82409 Acute embolism and thrombosis of unspecified deep veins of unspecified lower extremity: Secondary | ICD-10-CM | POA: Insufficient documentation

## 2014-12-27 DIAGNOSIS — IMO0001 Reserved for inherently not codable concepts without codable children: Secondary | ICD-10-CM | POA: Insufficient documentation

## 2014-12-27 DIAGNOSIS — R030 Elevated blood-pressure reading, without diagnosis of hypertension: Secondary | ICD-10-CM

## 2014-12-27 LAB — BASIC METABOLIC PANEL
Anion gap: 6 (ref 5–15)
BUN: 9 mg/dL (ref 6–20)
CALCIUM: 8.4 mg/dL — AB (ref 8.9–10.3)
CO2: 25 mmol/L (ref 22–32)
CREATININE: 0.87 mg/dL (ref 0.61–1.24)
Chloride: 105 mmol/L (ref 101–111)
GFR calc Af Amer: >60 mL/min (ref >60)
GLUCOSE: 133 mg/dL — AB (ref 65–99)
Potassium: 4.2 mmol/L (ref 3.5–5.1)
SODIUM: 136 mmol/L (ref 135–145)

## 2014-12-27 LAB — CBC
HCT: 32.6 % — ABNORMAL LOW (ref 39.0–52.0)
Hemoglobin: 11.2 g/dL — ABNORMAL LOW (ref 13.0–17.0)
MCH: 38.8 pg — ABNORMAL HIGH (ref 26.0–34.0)
MCHC: 34.4 g/dL (ref 30.0–36.0)
MCV: 112.8 fL — ABNORMAL HIGH (ref 78.0–100.0)
PLATELETS: 134 10*3/uL — AB (ref 150–400)
RBC: 2.89 MIL/uL — ABNORMAL LOW (ref 4.22–5.81)
RDW: 13.3 % (ref 11.5–15.5)
WBC: 5 10*3/uL (ref 4.0–10.5)

## 2014-12-27 MED ORDER — RIVAROXABAN 20 MG PO TABS: 20.0000 mg | ORAL_TABLET | Freq: Every day | ORAL | Status: DC

## 2014-12-27 MED ORDER — RIVAROXABAN 15 MG PO TABS
15.0000 mg | ORAL_TABLET | Freq: Two times a day (BID) | ORAL | Status: DC
Start: 2014-12-27 — End: 2016-01-23

## 2014-12-27 NOTE — Discharge Summary (Signed)
Physician Discharge Summary  Roger Ramirez ZOX:[id number] DOB: [birth year] DOA: 12/24/2014  PCP: Dr. Renford Dills  Admit date: 12/24/2014 Discharge date: 12/27/2014  Time spent: >30 minutes  Recommendations for Outpatient Follow-up:  1. Repeat CBC to follow Hgb and platelets trend 2. Check BMET to follow electrolytes and renal function 3. Reassess BP and initiate treatment for HTN if needed 4. Please repeat CT chest in 6 months to follow pulmonary nodule   Discharge Diagnoses:  Active Problems:   Pulmonary embolism   PE (pulmonary embolism)   Pleuritic chest pain   Leg DVT (deep venous thromboembolism), acute   Near syncope   Elevated BP Pulmonary Nodule   Discharge Condition: stable and improved. Discharge home with instructions to follow with PCP in 10 days.  Diet recommendation: heart healthy diet  Filed Weights   12/25/14 0457 12/26/14 0500 12/27/14 0442  Weight: 106.2 kg (234 lb 2.1 oz) 106 kg (233 lb 11 oz) 105.96 kg (233 lb 9.6 oz)    History of present illness:  69 y/o M, former remote smoker (quit early 1970) who presented to [facility] ER on 9/12 with 2-3 weeks of weakness, near syncopal episode and progressive SOB. Work up positive for large/saddle PE.  Hospital Course:  1-pleuritic Chest pain, near syncope and SOB: due to large bilateral saddle PE -patient treated with TPA -heparin drip and subsequent transitioned to xarelto -Echo demonstrating right heart strain and pulmonary HTN -positive LE duplex for acute RLE DVT -will continue treatment for at least 6 months with xarelto -follow up with PCP in outpatient setting  2-RLE DVT: as mentioned above would be treated with xarelto  3-elevated BP: no prior hx of HTN -recommended heart healthy low sodium diet and can be reassess as an outpatient with subsequent initiation on antihypertensive drugs if needed  4-pulmonary nodule: -hx of smoking in the past -will recommend repeat CT chest in 6 months -pulmonary  follow might be needed base on findings    Procedures: 9/12 CTA Chest >> large saddle PE, nearly occlusive on the R, concern for R heart failure, 6 mm RUL anterior nodule 9/13 Echocardiogram- RV cavity size is mildly dilated. Systolic function is mildly reduced. RVSP 46.  9/13 S/P EKOS LE duplex: positive RLE acute DVT  Consultations:  PCCM  Discharge Exam: Filed Vitals:   12/27/14 0442  BP: 149/62  Pulse: 63  Temp: 98.4 F (36.9 C)  Resp: 18    General: afebrile, no CP and with good O2 sat on RA (at rest and Ambulation) Cardiovascular: S1 and S2, no rubs or gallops Respiratory: CTA bilaterally  Abd: soft, NT, ND, positive BS Extremities: no cyanosis, no clubbing  Discharge Instructions   Discharge Instructions    Diet - low sodium heart healthy    Complete by:  As directed      Discharge instructions    Complete by:  As directed   Take medications as prescribed Follow up with PCP in 10 days Please follow heart healthy diet Maintain adequate hydration Avoid the use of NSAID's     Increase activity slowly    Complete by:  As directed           Current Discharge Medication List    START taking these medications   Details  !! Rivaroxaban (XARELTO) 15 MG TABS tablet Take 1 tablet (15 mg total) by mouth 2 (two) times daily. Qty: 40 tablet, Refills: 0    !! rivaroxaban (XARELTO) 20 MG TABS tablet Take 1 tablet (20 mg  total) by mouth daily. Qty: 30 tablet, Refills: 1     !! - Potential duplicate medications found. Please discuss with provider.     No Known Allergies Follow-up Information    Follow up with POLITE,RONALD D, MD. Schedule an appointment as soon as possible for a visit in 10 days.   Specialty:  Internal Medicine   Contact information:   [address] [place] [address] [phone]        The results of significant diagnostics from this hospitalization (including imaging, microbiology, ancillary and laboratory) are  listed below for reference.    Significant Diagnostic Studies: Dg Chest 2 View  12/24/2014   CLINICAL DATA:  Chest pain and shortness of breath for 2 weeks.  EXAM: CHEST  2 VIEW  COMPARISON:  None.  FINDINGS: Upper limits normal heart size noted.  There may be trace bilateral pleural effusions present.  There is no evidence of focal airspace disease, pulmonary edema, suspicious pulmonary nodule/mass, or pneumothorax.  No acute bony abnormalities are identified.  IMPRESSION: Upper limits normal heart size and question trace bilateral pleural effusions.   Electronically Signed   By: Harmon Pier M.D.   On: 12/24/2014 09:53   Ct Angio Chest Pe W/cm &/or Wo Cm  12/24/2014   CLINICAL DATA:  Progressive shortness breath for 2 weeks.  EXAM: CT ANGIOGRAPHY CHEST WITH CONTRAST  TECHNIQUE: Multidetector CT imaging of the chest was performed using the standard protocol during bolus administration of intravenous contrast. Multiplanar CT image reconstructions and MIPs were obtained to evaluate the vascular anatomy.  CONTRAST:  OMNIPAQUE IOHEXOL 350 MG/ML SOLN  COMPARISON:  None.  FINDINGS: There is large acute saddle pulmonary embolus, nearly occlusive within the right main pulmonary artery, with extension into the bilateral inter-lobar pulmonary arteries and multiple segmental and subsegmental pulmonary artery branches involving all lobes.  Right heart is somewhat prominent suggesting impending heart failure. The inferior vena cava appears similarly distended. Overall heart size is upper normal. No pericardial effusion. Thoracic aorta is normal in caliber. No aortic dissection. Atherosclerotic calcifications noted along the walls of the thoracic aorta.  There is mild dependent atelectasis at each lung base. Lungs otherwise clear. No pleural effusion. No pneumothorax. 6 mm pulmonary nodule noted within the right upper lobe anteriorly (series 7, image 44). Trachea and central bronchi are unremarkable. Limited images  of the upper abdomen are unremarkable.  Review of the MIP images confirms the above findings.  IMPRESSION: 1. Large saddle pulmonary embolus, nearly occlusive within the right main pulmonary artery, extending into the bilateral central interlobar pulmonary arteries and into numerous segmental and subsegmental pulmonary artery branches involving all lobes. 2. Somewhat prominent right heart size and inferior vena cava raising the possibility of impending right heart failure. 3. Overall heart size is upper normal.  No pericardial effusion. 4. No aortic aneurysm or dissection. 5. No pulmonary consolidation or pleural effusion. 6. 6 mm pulmonary nodule within the right upper lobe anteriorly. If the patient is at high risk for bronchogenic carcinoma, follow-up chest CT at 6-12 months is recommended. If the patient is at low risk for bronchogenic carcinoma, follow-up chest CT at 12 months is recommended. This recommendation follows the consensus statement: Guidelines for Management of Small Pulmonary Nodules Detected on CT Scans: A Statement from the Fleischner Society as published in Radiology 2005;237:395-400. These results were called by telephone at the time of interpretation on 12/24/2014 at 11:43 am to Dr. Bary Castilla , who was unavailable. Findings  discussed with the charge nurse, Efraim Kaufmann, who acknowledged the results.   Electronically Signed   By: Bary Richard M.D.   On: 12/24/2014 11:51   Ir Angiogram Pulmonary Bilateral Selective  12/24/2014   INDICATION: History of syncopal episode, found to have large burden bilateral / saddle pulmonary embolism with an abnormal right to left ventricular ratio on chest CTA.  Request made for initiation of ultrasound assisted catheter directed bilateral pulmonary arterial thrombolysis for the purposes of expeditious clot reduction and decreased right ventricular strain.  EXAM: 1. ULTRASOUND GUIDANCE FOR VENOUS ACCESS X2 2. BILATERAL PULMONARY ARTERIOGRAPHY 3.  FLUOROSCOPIC GUIDED PLACEMENT OF BILATERAL PULMONARY ARTERIAL LYTIC INFUSION CATHETERS  COMPARISON:  Chest CTA - earlier same day  MEDICATIONS: Versed 2 mg IV; Fentanyl 100 mcg IV  Sedation time: 60 minutes  CONTRAST:  66mL OMNIPAQUE IOHEXOL 300 MG/ML  SOLN  FLUOROSCOPY TIME:  6 minutes 12 seconds (292 mGy)  COMPLICATIONS: None immediate  TECHNIQUE: Informed written consent was obtained from the patient after a discussion of the risks, benefits and alternatives to treatment. Questions regarding the procedure were encouraged and answered. A timeout was performed prior to the initiation of the procedure.  Ultrasound scanning was performed of the right groin in demonstrate wide patency of the right common femoral vein. As such, the right groin was prepped and draped in the usual sterile fashion, and a sterile drape was applied covering the operative field. Maximum barrier sterile technique with sterile gowns and gloves were used for the procedure. A timeout was performed prior to the initiation of the procedure. Local anesthesia was provided with 1% lidocaine.  Under direct ultrasound guidance, the right common femoral vein was accessed with a micro puncture sheath ultimately allowing placement of a 6 French vascular sheath. Slightly cranial to this initial access, the right internal jugular vein was again accessed with a micropuncture sheath ultimately allowing placement of an additional 6 French vascular sheath.  With the use of a glidewire, a vertebral catheter was advanced into the left main pulmonary artery and a limited left pulmonary arteriogram was performed with a hand injection.  Pressure measurements present obtained at the level of the main pulmonary artery.  Over an exchange length Rosen wire, the pigtail catheter was exchanged for a 105/12 cm multi side-hole EKOS ultrasound assisted infusion catheter.  With the use of a glidewire, a pigtail catheter was advanced into the right main pulmonary artery and a  limited left pulmonary arteriogram was performed with a hand injection. Over an exchange length Rosen wire, the pigtail catheter was exchanged for a 105/18 cm multi side-hole EKOS ultrasound assisted infusion catheter.  A postprocedural fluoroscopic image was obtained of the check demonstrating final catheter positioning.  Both vascular sheath were secured at the right neck with interrupted 0 Prolene sutures. The external catheter tubing was secured at the right chest and the lytic therapy was initiated.  The patient tolerated the procedure well without immediate postprocedural complication.  FINDINGS: Limited pulmonary arteriograms demonstrates a moderate to large amount of nonocclusive pulmonary embolism within the distal aspects of both the right and left main pulmonary arteries, similar, similar to preceding chest CTA. Both the right and left pulmonary arteries are noted to be markedly enlarged.  Acquired pressure measurements:  Main pulmonary artery - 84/30; mean - 49 (normal: < 25/10)  Following the procedure, both ultrasound assisted infusion catheter tips terminate within the distal aspects of the bilateral lower lobe sub segmental pulmonary arteries.  IMPRESSION: 1. Successful fluoroscopic guided initiation  of bilateral ultrasound assisted catheter directed pulmonary arterial lysis for sub massive pulmonary embolism and right-sided heart strain. 2. Markedly elevated pressure measurements within the right main pulmonary artery compatible with critical pulmonary arterial hypertension.  PLAN: The patient will return to the interventional radiology suite approximately 12 hours following the initiation of the catheter directed pulmonary arterial lysis for repeat pressure measurements and either removal of the catheters or continuation of the catheter directed thrombolysis.   Electronically Signed   By: Simonne Come M.D.   On: 12/24/2014 17:12   Ir Angiogram Selective Each Additional Vessel  12/24/2014    INDICATION: History of syncopal episode, found to have large burden bilateral / saddle pulmonary embolism with an abnormal right to left ventricular ratio on chest CTA.  Request made for initiation of ultrasound assisted catheter directed bilateral pulmonary arterial thrombolysis for the purposes of expeditious clot reduction and decreased right ventricular strain.  EXAM: 1. ULTRASOUND GUIDANCE FOR VENOUS ACCESS X2 2. BILATERAL PULMONARY ARTERIOGRAPHY 3. FLUOROSCOPIC GUIDED PLACEMENT OF BILATERAL PULMONARY ARTERIAL LYTIC INFUSION CATHETERS  COMPARISON:  Chest CTA - earlier same day  MEDICATIONS: Versed 2 mg IV; Fentanyl 100 mcg IV  Sedation time: 60 minutes  CONTRAST:  15mL OMNIPAQUE IOHEXOL 300 MG/ML  SOLN  FLUOROSCOPY TIME:  6 minutes 12 seconds (292 mGy)  COMPLICATIONS: None immediate  TECHNIQUE: Informed written consent was obtained from the patient after a discussion of the risks, benefits and alternatives to treatment. Questions regarding the procedure were encouraged and answered. A timeout was performed prior to the initiation of the procedure.  Ultrasound scanning was performed of the right groin in demonstrate wide patency of the right common femoral vein. As such, the right groin was prepped and draped in the usual sterile fashion, and a sterile drape was applied covering the operative field. Maximum barrier sterile technique with sterile gowns and gloves were used for the procedure. A timeout was performed prior to the initiation of the procedure. Local anesthesia was provided with 1% lidocaine.  Under direct ultrasound guidance, the right common femoral vein was accessed with a micro puncture sheath ultimately allowing placement of a 6 French vascular sheath. Slightly cranial to this initial access, the right internal jugular vein was again accessed with a micropuncture sheath ultimately allowing placement of an additional 6 French vascular sheath.  With the use of a glidewire, a vertebral catheter was  advanced into the left main pulmonary artery and a limited left pulmonary arteriogram was performed with a hand injection.  Pressure measurements present obtained at the level of the main pulmonary artery.  Over an exchange length Rosen wire, the pigtail catheter was exchanged for a 105/12 cm multi side-hole EKOS ultrasound assisted infusion catheter.  With the use of a glidewire, a pigtail catheter was advanced into the right main pulmonary artery and a limited left pulmonary arteriogram was performed with a hand injection. Over an exchange length Rosen wire, the pigtail catheter was exchanged for a 105/18 cm multi side-hole EKOS ultrasound assisted infusion catheter.  A postprocedural fluoroscopic image was obtained of the check demonstrating final catheter positioning.  Both vascular sheath were secured at the right neck with interrupted 0 Prolene sutures. The external catheter tubing was secured at the right chest and the lytic therapy was initiated.  The patient tolerated the procedure well without immediate postprocedural complication.  FINDINGS: Limited pulmonary arteriograms demonstrates a moderate to large amount of nonocclusive pulmonary embolism within the distal aspects of both the right and left main pulmonary  arteries, similar, similar to preceding chest CTA. Both the right and left pulmonary arteries are noted to be markedly enlarged.  Acquired pressure measurements:  Main pulmonary artery - 84/30; mean - 49 (normal: < 25/10)  Following the procedure, both ultrasound assisted infusion catheter tips terminate within the distal aspects of the bilateral lower lobe sub segmental pulmonary arteries.  IMPRESSION: 1. Successful fluoroscopic guided initiation of bilateral ultrasound assisted catheter directed pulmonary arterial lysis for sub massive pulmonary embolism and right-sided heart strain. 2. Markedly elevated pressure measurements within the right main pulmonary artery compatible with critical  pulmonary arterial hypertension.  PLAN: The patient will return to the interventional radiology suite approximately 12 hours following the initiation of the catheter directed pulmonary arterial lysis for repeat pressure measurements and either removal of the catheters or continuation of the catheter directed thrombolysis.   Electronically Signed   By: Simonne Come M.D.   On: 12/24/2014 17:12   Ir Angiogram Selective Each Additional Vessel  12/24/2014   INDICATION: History of syncopal episode, found to have large burden bilateral / saddle pulmonary embolism with an abnormal right to left ventricular ratio on chest CTA.  Request made for initiation of ultrasound assisted catheter directed bilateral pulmonary arterial thrombolysis for the purposes of expeditious clot reduction and decreased right ventricular strain.  EXAM: 1. ULTRASOUND GUIDANCE FOR VENOUS ACCESS X2 2. BILATERAL PULMONARY ARTERIOGRAPHY 3. FLUOROSCOPIC GUIDED PLACEMENT OF BILATERAL PULMONARY ARTERIAL LYTIC INFUSION CATHETERS  COMPARISON:  Chest CTA - earlier same day  MEDICATIONS: Versed 2 mg IV; Fentanyl 100 mcg IV  Sedation time: 60 minutes  CONTRAST:  15mL OMNIPAQUE IOHEXOL 300 MG/ML  SOLN  FLUOROSCOPY TIME:  6 minutes 12 seconds (292 mGy)  COMPLICATIONS: None immediate  TECHNIQUE: Informed written consent was obtained from the patient after a discussion of the risks, benefits and alternatives to treatment. Questions regarding the procedure were encouraged and answered. A timeout was performed prior to the initiation of the procedure.  Ultrasound scanning was performed of the right groin in demonstrate wide patency of the right common femoral vein. As such, the right groin was prepped and draped in the usual sterile fashion, and a sterile drape was applied covering the operative field. Maximum barrier sterile technique with sterile gowns and gloves were used for the procedure. A timeout was performed prior to the initiation of the procedure. Local  anesthesia was provided with 1% lidocaine.  Under direct ultrasound guidance, the right common femoral vein was accessed with a micro puncture sheath ultimately allowing placement of a 6 French vascular sheath. Slightly cranial to this initial access, the right internal jugular vein was again accessed with a micropuncture sheath ultimately allowing placement of an additional 6 French vascular sheath.  With the use of a glidewire, a vertebral catheter was advanced into the left main pulmonary artery and a limited left pulmonary arteriogram was performed with a hand injection.  Pressure measurements present obtained at the level of the main pulmonary artery.  Over an exchange length Rosen wire, the pigtail catheter was exchanged for a 105/12 cm multi side-hole EKOS ultrasound assisted infusion catheter.  With the use of a glidewire, a pigtail catheter was advanced into the right main pulmonary artery and a limited left pulmonary arteriogram was performed with a hand injection. Over an exchange length Rosen wire, the pigtail catheter was exchanged for a 105/18 cm multi side-hole EKOS ultrasound assisted infusion catheter.  A postprocedural fluoroscopic image was obtained of the check demonstrating final catheter positioning.  Both  vascular sheath were secured at the right neck with interrupted 0 Prolene sutures. The external catheter tubing was secured at the right chest and the lytic therapy was initiated.  The patient tolerated the procedure well without immediate postprocedural complication.  FINDINGS: Limited pulmonary arteriograms demonstrates a moderate to large amount of nonocclusive pulmonary embolism within the distal aspects of both the right and left main pulmonary arteries, similar, similar to preceding chest CTA. Both the right and left pulmonary arteries are noted to be markedly enlarged.  Acquired pressure measurements:  Main pulmonary artery - 84/30; mean - 49 (normal: < 25/10)  Following the procedure,  both ultrasound assisted infusion catheter tips terminate within the distal aspects of the bilateral lower lobe sub segmental pulmonary arteries.  IMPRESSION: 1. Successful fluoroscopic guided initiation of bilateral ultrasound assisted catheter directed pulmonary arterial lysis for sub massive pulmonary embolism and right-sided heart strain. 2. Markedly elevated pressure measurements within the right main pulmonary artery compatible with critical pulmonary arterial hypertension.  PLAN: The patient will return to the interventional radiology suite approximately 12 hours following the initiation of the catheter directed pulmonary arterial lysis for repeat pressure measurements and either removal of the catheters or continuation of the catheter directed thrombolysis.   Electronically Signed   By: Simonne Come M.D.   On: 12/24/2014 17:12   Ir US Guide Vasc Access Right  12/24/2014   INDICATION: History of syncopal episode, found to have large burden bilateral / saddle pulmonary embolism with an abnormal right to left ventricular ratio on chest CTA.  Request made for initiation of ultrasound assisted catheter directed bilateral pulmonary arterial thrombolysis for the purposes of expeditious clot reduction and decreased right ventricular strain.  EXAM: 1. ULTRASOUND GUIDANCE FOR VENOUS ACCESS X2 2. BILATERAL PULMONARY ARTERIOGRAPHY 3. FLUOROSCOPIC GUIDED PLACEMENT OF BILATERAL PULMONARY ARTERIAL LYTIC INFUSION CATHETERS  COMPARISON:  Chest CTA - earlier same day  MEDICATIONS: Versed 2 mg IV; Fentanyl 100 mcg IV  Sedation time: 60 minutes  CONTRAST:  15mL OMNIPAQUE IOHEXOL 300 MG/ML  SOLN  FLUOROSCOPY TIME:  6 minutes 12 seconds (292 mGy)  COMPLICATIONS: None immediate  TECHNIQUE: Informed written consent was obtained from the patient after a discussion of the risks, benefits and alternatives to treatment. Questions regarding the procedure were encouraged and answered. A timeout was performed prior to the initiation of  the procedure.  Ultrasound scanning was performed of the right groin in demonstrate wide patency of the right common femoral vein. As such, the right groin was prepped and draped in the usual sterile fashion, and a sterile drape was applied covering the operative field. Maximum barrier sterile technique with sterile gowns and gloves were used for the procedure. A timeout was performed prior to the initiation of the procedure. Local anesthesia was provided with 1% lidocaine.  Under direct ultrasound guidance, the right common femoral vein was accessed with a micro puncture sheath ultimately allowing placement of a 6 French vascular sheath. Slightly cranial to this initial access, the right internal jugular vein was again accessed with a micropuncture sheath ultimately allowing placement of an additional 6 French vascular sheath.  With the use of a glidewire, a vertebral catheter was advanced into the left main pulmonary artery and a limited left pulmonary arteriogram was performed with a hand injection.  Pressure measurements present obtained at the level of the main pulmonary artery.  Over an exchange length Rosen wire, the pigtail catheter was exchanged for a 105/12 cm multi side-hole EKOS ultrasound assisted infusion catheter.  With the use of a glidewire, a pigtail catheter was advanced into the right main pulmonary artery and a limited left pulmonary arteriogram was performed with a hand injection. Over an exchange length Rosen wire, the pigtail catheter was exchanged for a 105/18 cm multi side-hole EKOS ultrasound assisted infusion catheter.  A postprocedural fluoroscopic image was obtained of the check demonstrating final catheter positioning.  Both vascular sheath were secured at the right neck with interrupted 0 Prolene sutures. The external catheter tubing was secured at the right chest and the lytic therapy was initiated.  The patient tolerated the procedure well without immediate postprocedural  complication.  FINDINGS: Limited pulmonary arteriograms demonstrates a moderate to large amount of nonocclusive pulmonary embolism within the distal aspects of both the right and left main pulmonary arteries, similar, similar to preceding chest CTA. Both the right and left pulmonary arteries are noted to be markedly enlarged.  Acquired pressure measurements:  Main pulmonary artery - 84/30; mean - 49 (normal: < 25/10)  Following the procedure, both ultrasound assisted infusion catheter tips terminate within the distal aspects of the bilateral lower lobe sub segmental pulmonary arteries.  IMPRESSION: 1. Successful fluoroscopic guided initiation of bilateral ultrasound assisted catheter directed pulmonary arterial lysis for sub massive pulmonary embolism and right-sided heart strain. 2. Markedly elevated pressure measurements within the right main pulmonary artery compatible with critical pulmonary arterial hypertension.  PLAN: The patient will return to the interventional radiology suite approximately 12 hours following the initiation of the catheter directed pulmonary arterial lysis for repeat pressure measurements and either removal of the catheters or continuation of the catheter directed thrombolysis.   Electronically Signed   By: Simonne Come M.D.   On: 12/24/2014 17:12   Ir Infusion Thrombol Arterial Initial (ms)  12/24/2014   INDICATION: History of syncopal episode, found to have large burden bilateral / saddle pulmonary embolism with an abnormal right to left ventricular ratio on chest CTA.  Request made for initiation of ultrasound assisted catheter directed bilateral pulmonary arterial thrombolysis for the purposes of expeditious clot reduction and decreased right ventricular strain.  EXAM: 1. ULTRASOUND GUIDANCE FOR VENOUS ACCESS X2 2. BILATERAL PULMONARY ARTERIOGRAPHY 3. FLUOROSCOPIC GUIDED PLACEMENT OF BILATERAL PULMONARY ARTERIAL LYTIC INFUSION CATHETERS  COMPARISON:  Chest CTA - earlier same day   MEDICATIONS: Versed 2 mg IV; Fentanyl 100 mcg IV  Sedation time: 60 minutes  CONTRAST:  15mL OMNIPAQUE IOHEXOL 300 MG/ML  SOLN  FLUOROSCOPY TIME:  6 minutes 12 seconds (292 mGy)  COMPLICATIONS: None immediate  TECHNIQUE: Informed written consent was obtained from the patient after a discussion of the risks, benefits and alternatives to treatment. Questions regarding the procedure were encouraged and answered. A timeout was performed prior to the initiation of the procedure.  Ultrasound scanning was performed of the right groin in demonstrate wide patency of the right common femoral vein. As such, the right groin was prepped and draped in the usual sterile fashion, and a sterile drape was applied covering the operative field. Maximum barrier sterile technique with sterile gowns and gloves were used for the procedure. A timeout was performed prior to the initiation of the procedure. Local anesthesia was provided with 1% lidocaine.  Under direct ultrasound guidance, the right common femoral vein was accessed with a micro puncture sheath ultimately allowing placement of a 6 French vascular sheath. Slightly cranial to this initial access, the right internal jugular vein was again accessed with a micropuncture sheath ultimately allowing placement of an additional 6 French vascular sheath.  With the use of a glidewire, a vertebral catheter was advanced into the left main pulmonary artery and a limited left pulmonary arteriogram was performed with a hand injection.  Pressure measurements present obtained at the level of the main pulmonary artery.  Over an exchange length Rosen wire, the pigtail catheter was exchanged for a 105/12 cm multi side-hole EKOS ultrasound assisted infusion catheter.  With the use of a glidewire, a pigtail catheter was advanced into the right main pulmonary artery and a limited left pulmonary arteriogram was performed with a hand injection. Over an exchange length Rosen wire, the pigtail catheter  was exchanged for a 105/18 cm multi side-hole EKOS ultrasound assisted infusion catheter.  A postprocedural fluoroscopic image was obtained of the check demonstrating final catheter positioning.  Both vascular sheath were secured at the right neck with interrupted 0 Prolene sutures. The external catheter tubing was secured at the right chest and the lytic therapy was initiated.  The patient tolerated the procedure well without immediate postprocedural complication.  FINDINGS: Limited pulmonary arteriograms demonstrates a moderate to large amount of nonocclusive pulmonary embolism within the distal aspects of both the right and left main pulmonary arteries, similar, similar to preceding chest CTA. Both the right and left pulmonary arteries are noted to be markedly enlarged.  Acquired pressure measurements:  Main pulmonary artery - 84/30; mean - 49 (normal: < 25/10)  Following the procedure, both ultrasound assisted infusion catheter tips terminate within the distal aspects of the bilateral lower lobe sub segmental pulmonary arteries.  IMPRESSION: 1. Successful fluoroscopic guided initiation of bilateral ultrasound assisted catheter directed pulmonary arterial lysis for sub massive pulmonary embolism and right-sided heart strain. 2. Markedly elevated pressure measurements within the right main pulmonary artery compatible with critical pulmonary arterial hypertension.  PLAN: The patient will return to the interventional radiology suite approximately 12 hours following the initiation of the catheter directed pulmonary arterial lysis for repeat pressure measurements and either removal of the catheters or continuation of the catheter directed thrombolysis.   Electronically Signed   By: Simonne Come M.D.   On: 12/24/2014 17:12   Ir Infusion Thrombol Arterial Initial (ms)  12/24/2014   INDICATION: History of syncopal episode, found to have large burden bilateral / saddle pulmonary embolism with an abnormal right to left  ventricular ratio on chest CTA.  Request made for initiation of ultrasound assisted catheter directed bilateral pulmonary arterial thrombolysis for the purposes of expeditious clot reduction and decreased right ventricular strain.  EXAM: 1. ULTRASOUND GUIDANCE FOR VENOUS ACCESS X2 2. BILATERAL PULMONARY ARTERIOGRAPHY 3. FLUOROSCOPIC GUIDED PLACEMENT OF BILATERAL PULMONARY ARTERIAL LYTIC INFUSION CATHETERS  COMPARISON:  Chest CTA - earlier same day  MEDICATIONS: Versed 2 mg IV; Fentanyl 100 mcg IV  Sedation time: 60 minutes  CONTRAST:  15mL OMNIPAQUE IOHEXOL 300 MG/ML  SOLN  FLUOROSCOPY TIME:  6 minutes 12 seconds (292 mGy)  COMPLICATIONS: None immediate  TECHNIQUE: Informed written consent was obtained from the patient after a discussion of the risks, benefits and alternatives to treatment. Questions regarding the procedure were encouraged and answered. A timeout was performed prior to the initiation of the procedure.  Ultrasound scanning was performed of the right groin in demonstrate wide patency of the right common femoral vein. As such, the right groin was prepped and draped in the usual sterile fashion, and a sterile drape was applied covering the operative field. Maximum barrier sterile technique with sterile gowns and gloves were used for the procedure. A timeout was performed prior to  the initiation of the procedure. Local anesthesia was provided with 1% lidocaine.  Under direct ultrasound guidance, the right common femoral vein was accessed with a micro puncture sheath ultimately allowing placement of a 6 French vascular sheath. Slightly cranial to this initial access, the right internal jugular vein was again accessed with a micropuncture sheath ultimately allowing placement of an additional 6 French vascular sheath.  With the use of a glidewire, a vertebral catheter was advanced into the left main pulmonary artery and a limited left pulmonary arteriogram was performed with a hand injection.  Pressure  measurements present obtained at the level of the main pulmonary artery.  Over an exchange length Rosen wire, the pigtail catheter was exchanged for a 105/12 cm multi side-hole EKOS ultrasound assisted infusion catheter.  With the use of a glidewire, a pigtail catheter was advanced into the right main pulmonary artery and a limited left pulmonary arteriogram was performed with a hand injection. Over an exchange length Rosen wire, the pigtail catheter was exchanged for a 105/18 cm multi side-hole EKOS ultrasound assisted infusion catheter.  A postprocedural fluoroscopic image was obtained of the check demonstrating final catheter positioning.  Both vascular sheath were secured at the right neck with interrupted 0 Prolene sutures. The external catheter tubing was secured at the right chest and the lytic therapy was initiated.  The patient tolerated the procedure well without immediate postprocedural complication.  FINDINGS: Limited pulmonary arteriograms demonstrates a moderate to large amount of nonocclusive pulmonary embolism within the distal aspects of both the right and left main pulmonary arteries, similar, similar to preceding chest CTA. Both the right and left pulmonary arteries are noted to be markedly enlarged.  Acquired pressure measurements:  Main pulmonary artery - 84/30; mean - 49 (normal: < 25/10)  Following the procedure, both ultrasound assisted infusion catheter tips terminate within the distal aspects of the bilateral lower lobe sub segmental pulmonary arteries.  IMPRESSION: 1. Successful fluoroscopic guided initiation of bilateral ultrasound assisted catheter directed pulmonary arterial lysis for sub massive pulmonary embolism and right-sided heart strain. 2. Markedly elevated pressure measurements within the right main pulmonary artery compatible with critical pulmonary arterial hypertension.  PLAN: The patient will return to the interventional radiology suite approximately 12 hours following the  initiation of the catheter directed pulmonary arterial lysis for repeat pressure measurements and either removal of the catheters or continuation of the catheter directed thrombolysis.   Electronically Signed   By: Simonne Come M.D.   On: 12/24/2014 17:12   Ir Rande Lawman F/u Eval Art/ven Final Day (ms)  12/25/2014   INDICATION: History of sub massive pulmonary embolism, post initiation of ultrasound assisted bilateral pulmonary arterial thrombolysis on 12/24/2014.  The patient returns today for post arterial thrombolysis pulmonary arterial pressure measurements. The patient reports subjective improvement in his preprocedural shortness of breath.  EXAM: IR THROMB F/U EVAL ART/VEN FINAL DAY  COMPARISON:  Ultrasound fluoroscopic guided initiation of bilateral ultrasound assisted pulmonary arterial thrombolysis -12/24/2014; chest CTA - 12/23/2024  MEDICATIONS: None.  CONTRAST:  None  FLUOROSCOPY TIME:  None  COMPLICATIONS: None immediate  TECHNIQUE: The right EKOS infusion wire was removed and the catheter was connected to a pressure measurement device. The right pulmonary arterial infusion catheter was retracted to the level of the right main pulmonary artery and pressure measurements were obtained.  Measurements were reviewed and the procedure was terminated. All wires catheters and sheaths were removed from the patient. Hemostasis was achieved at the right groin access site with manual compression.  Dressings were placed. The patient tolerated the procedure well without immediate postprocedural complication.  FINDINGS: Significant reduction in patient's main pulmonary arterial pressure measurements as follows:  Preprocedural main pulmonary artery pressure measurements: 84/30 (mean - 49).  Postprocedural mean pulmonary arterial pressure measurements: 47/29 (mean - 36).  IMPRESSION: Technically successful bilateral ultrasound assisted pulmonary arterial thrombolysis with significant reduction in patient's pulmonary  arterial pressure measurements.   Electronically Signed   By: Simonne Come M.D.   On: 12/25/2014 15:20    Microbiology: Recent Results (from the past 240 hour(s))  MRSA PCR Screening     Status: None   Collection Time: 12/24/14  6:20 PM  Result Value Ref Range Status   MRSA by PCR NEGATIVE NEGATIVE Final    Comment:        The GeneXpert MRSA Assay (FDA approved for NASAL specimens only), is one component of a comprehensive MRSA colonization surveillance program. It is not intended to diagnose MRSA infection nor to guide or monitor treatment for MRSA infections.      Labs: Basic Metabolic Panel:  Recent Labs Lab 12/24/14 0900 12/25/14 0542 12/26/14 0217 12/27/14 0518  NA 135 139 137 136  K 3.8 4.5 3.8 4.2  CL 106 112* 108 105  CO2 17* 22 23 25   GLUCOSE 231* 141* 148* 133*  BUN 14 12 10 9   CREATININE 1.08 0.96 0.90 0.87  CALCIUM 8.9 8.1* 8.2* 8.4*   CBC:  Recent Labs Lab 12/25/14 0218 12/25/14 0808 12/25/14 1408 12/26/14 0217 12/27/14 0518  WBC 6.8 5.9 5.5 5.7 5.0  HGB 12.4* 11.8* 12.2* 11.4* 11.2*  HCT 36.5* 34.3* 36.1* 33.9* 32.6*  MCV 112.7* 112.8* 113.5* 113.4* 112.8*  PLT 130* 117* 117* 103* 134*   Cardiac Enzymes:  Recent Labs Lab 12/24/14 1912 12/25/14 0218  TROPONINI 0.43* 0.78*   BNP: BNP (last 3 results)  Recent Labs  12/24/14 0900  BNP 84.7   CBG:  Recent Labs Lab 12/24/14 1130 12/24/14 1455  GLUCAP 160* 209*    Signed:  Vassie Loll  [facility] 12/27/2014, 12:47 PM

## 2014-12-27 NOTE — Progress Notes (Signed)
Pt discharged home with significant other. Discharge instruction reviewed and given to patient. Along with medication prescription and reference numbers for prescriptions. Pt VU.   Bil hand saline lock removed.

## 2014-12-27 NOTE — Care Management Note (Signed)
Case Management Note CM note started by Avie Arenas RNCM  Patient Details  Name: Roger Ramirez MRN: [id number] Date of Birth: 09/08/1945  Subjective/Objective:    Plan to place patient on xaralto.  Pharm D gave patient free 30 day card and 0 copay card.  Benefits checked -     Pt copay will be $45- prior Berkley Harvey is required [phone].             Action/Plan: Plan to return home with family  Expected Discharge Date:       12/27/14           Expected Discharge Plan:  Home/Self Care  In-House Referral:     Discharge planning Services  CM Consult, Medication Assistance  Post Acute Care Choice:    Choice offered to:     DME Arranged:    DME Agency:     HH Arranged:    HH Agency:     Status of Service:  Completed, signed off  Medicare Important Message Given:    Date Medicare IM Given:    Medicare IM give by:    Date Additional Medicare IM Given:    Additional Medicare Important Message give by:     If discussed at Long Length of Stay Meetings, dates discussed:    Additional Comments:  9/151/6- Donn Pierini RN, BSN - spoke with pt and family at bedside- confirmed that pt has 30 day free card- discussed PCP- per pt/family- his current PCP is Dr. Nehemiah Settle- however pt plans to change PCP to DR. Cyndia Bent and is in the process of doing this- explained to pt that his Xarelto does require a pre-auth from his doctor- and he would need to make sure that Dr. Nehemiah Settle took care of this prior to pt running out of his 30 day free supply. Family reports that they will f/u on this to be sure it is taken care of.   Darrold Span, RN 12/27/2014, 11:49 AM

## 2014-12-27 NOTE — Progress Notes (Signed)
Ambulated pt in hallway on RA, sats 97% at rest and sats 97-100% during ambulation. Pt tolerated well, no SOB. Reported to Dr Gwenlyn Perking.

## 2014-12-27 NOTE — Care Management Important Message (Signed)
Important Message  Patient Details  Name: Roger Ramirez MRN: [id number] Date of Birth: 03-18-1946   Medicare Important Message Given:  Yes-second notification given    Kyla Balzarine 12/27/2014, 3:45 PM

## 2014-12-27 NOTE — Progress Notes (Signed)
Utilization review completed.  

## 2015-07-17 ENCOUNTER — Other Ambulatory Visit: Payer: Self-pay | Admitting: Internal Medicine

## 2015-07-17 DIAGNOSIS — R911 Solitary pulmonary nodule: Secondary | ICD-10-CM

## 2015-07-22 ENCOUNTER — Ambulatory Visit
Admission: RE | Admit: 2015-07-22 | Discharge: 2015-07-22 | Disposition: A | Discharge status: Home / Self Care | Payer: Medicare Other | Source: Ambulatory Visit | Attending: Internal Medicine | Admitting: Internal Medicine

## 2015-07-22 DIAGNOSIS — R911 Solitary pulmonary nodule: Secondary | ICD-10-CM

## 2016-01-01 ENCOUNTER — Telehealth: Payer: Self-pay | Admitting: Hematology and Oncology

## 2016-01-01 ENCOUNTER — Encounter: Payer: Self-pay | Admitting: Hematology and Oncology

## 2016-01-01 NOTE — Telephone Encounter (Signed)
Pt confirmed appt, completed intake, requested no letter be mailed out, faxed referring provider appt date/time

## 2016-01-23 ENCOUNTER — Ambulatory Visit (HOSPITAL_BASED_OUTPATIENT_CLINIC_OR_DEPARTMENT_OTHER): Payer: Medicare Other

## 2016-01-23 ENCOUNTER — Ambulatory Visit (HOSPITAL_BASED_OUTPATIENT_CLINIC_OR_DEPARTMENT_OTHER): Payer: Medicare Other | Admitting: Hematology and Oncology

## 2016-01-23 ENCOUNTER — Encounter: Payer: Self-pay | Admitting: Hematology and Oncology

## 2016-01-23 DIAGNOSIS — I2692 Saddle embolus of pulmonary artery without acute cor pulmonale: Secondary | ICD-10-CM | POA: Diagnosis not present

## 2016-01-23 DIAGNOSIS — E538 Deficiency of other specified B group vitamins: Secondary | ICD-10-CM | POA: Insufficient documentation

## 2016-01-23 DIAGNOSIS — E782 Mixed hyperlipidemia: Secondary | ICD-10-CM | POA: Insufficient documentation

## 2016-01-23 DIAGNOSIS — F09 Unspecified mental disorder due to known physiological condition: Secondary | ICD-10-CM | POA: Insufficient documentation

## 2016-01-23 DIAGNOSIS — I82401 Acute embolism and thrombosis of unspecified deep veins of right lower extremity: Secondary | ICD-10-CM | POA: Diagnosis not present

## 2016-01-23 DIAGNOSIS — E039 Hypothyroidism, unspecified: Secondary | ICD-10-CM | POA: Insufficient documentation

## 2016-01-23 DIAGNOSIS — R7301 Impaired fasting glucose: Secondary | ICD-10-CM | POA: Insufficient documentation

## 2016-01-23 NOTE — Assessment & Plan Note (Signed)
70 y/o Male, former remote smoker (quit early 1970) who presented to [facility] ER on 9/12 with 2-3 weeks of weakness, near syncopal episode and progressive SOB. Work up positive for large/saddle PE. 9/12 CTA Chest: large saddle PE, nearly occlusive on the R, concern for R heart failure, 6 mm RUL anterior nodule 9/13 Echocardiogram- RV cavity size is mildly dilated. Systolic function is mildly reduced. RVSP 46.  LE duplex: positive RLE acute DVT  Recommendation: 1. Hypercoagulability workup especially antiphospholipid antibody syndrome, factor V Leiden and prothrombin gene mutation 2. Continue anticoagulation for a minimum of one year if not longer  Return to clinic in 2 weeks to discuss the results of these tests.

## 2016-01-23 NOTE — Progress Notes (Signed)
[facility] CONSULT NOTE  CHIEF COMPLAINTS/PURPOSE OF CONSULTATION:  History of saddle pulmonary embolism  HISTORY OF PRESENTING ILLNESS:  Roger Ramirez 70 y.o. male is here because of diagnosis saddle pulmonary embolism and lower extremity DVT diagnosis September 2016. He presented to the hospital with syncope shortness of breath and was diagnosed with saddle pulmonary embolism. He also had bilateral DVTs. He does not remember any aggravating factors that predispose to development of a PE. He was started on Xarelto and has been on it for the past 1 year. He reports that he is tolerating it extremely well. He does not have any excessive bruising or bleeding symptoms.  I reviewed her records extensively and collaborated the history with the patient.  MEDICAL HISTORY:  Hypercholesterolemia, right lower extremity DVT, bilateral pulmonary emboli September 2016, lung nodule stable, cognitive dysfunction, B-12 deficiency, hypothyroidism SURGICAL HISTORY:none  SOCIAL HISTORY: Social History   Social History  . Marital status: Divorced    Spouse name: N/A  . Number of children: N/A  . Years of education: N/A   Occupational History  . Not on file.   Social History Main Topics  . Smoking status: Former Smoker    Packs/day: 1.00    Years: 20.00    Types: Cigarettes  . Smokeless tobacco: Not on file     Comment: Quit 1970's  . Alcohol use 0.0 oz/week     Comment: Occasional beer / 1 every 2 or 3 weeks  . Drug use: No  . Sexual activity: Not on file   Other Topics Concern  . 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  MEDICATIONS:  Current Outpatient Prescriptions  Medication Sig Dispense Refill  . rivaroxaban (XARELTO) 20 MG TABS tablet Take 1 tablet (20 mg  total) by mouth daily. 30 tablet 1   No current facility-administered medications for this visit.     REVIEW OF SYSTEMS:   Constitutional: Denies fevers, chills or abnormal night sweats Eyes: Denies blurriness of vision, double vision or watery eyes Ears, nose, mouth, throat, and face: Denies mucositis or sore throat Respiratory:chronic shortness of breath Cardiovascular: Denies palpitation, chest discomfort or lower extremity swelling Gastrointestinal:  Denies nausea, heartburn or change in bowel habits Skin: Denies abnormal skin rashes Lymphatics: Denies new lymphadenopathy or easy bruising Neurological:Denies numbness, tingling or new weaknesses Behavioral/Psych: Mood is stable, no new changes   All other systems were reviewed with the patient and are negative.  PHYSICAL EXAMINATION: ECOG PERFORMANCE STATUS: 1 - Symptomatic but completely ambulatory  Vitals:   01/23/16 1309  BP: (!) 165/85  Pulse: 79  Resp: 18  Temp: 98 F (36.7 C)   Filed Weights   01/23/16 1309  Weight: 232 lb 1.6 oz (105.3 kg)    GENERAL:alert, no distress and comfortable SKIN: skin color, texture, turgor are normal, no rashes or significant lesions EYES: normal, conjunctiva are pink and non-injected, sclera clear OROPHARYNX:no exudate, no erythema and lips, buccal mucosa, and tongue normal  NECK: supple, thyroid normal size, non-tender, without nodularity LYMPH:  no palpable lymphadenopathy in the cervical, axillary or inguinal LUNGS: clear to auscultation and percussion with normal breathing effort HEART: regular rate & rhythm and no murmurs and no lower extremity edema ABDOMEN:abdomen soft, non-tender and normal  bowel sounds Musculoskeletal:no cyanosis of digits and no clubbing  PSYCH: alert & oriented x 3 with fluent speech NEURO: no focal motor/sensory deficits  LABORATORY DATA:  I have reviewed the data as listed Lab Results  Component Value Date   WBC 5.0 12/27/2014   HGB 11.2 (L)  12/27/2014   HCT 32.6 (L) 12/27/2014   MCV 112.8 (H) 12/27/2014   PLT 134 (L) 12/27/2014   Lab Results  Component Value Date   NA 136 12/27/2014   K 4.2 12/27/2014   CL 105 12/27/2014   CO2 25 12/27/2014    RADIOGRAPHIC STUDIES: I have personally reviewed the radiological reports and agreed with the findings in the report.  ASSESSMENT AND PLAN:  Pulmonary embolism diagnosed in 2016 70 y/o Male, former remote smoker (quit early 1970) who [facility]12 with 2-3 weeks of weakness, near syncopal episode and progressive SOB. Work up positive for large/saddle PE. 9/12 CTA Chest: large saddle PE, nearly occlusive on the R, concern for R heart failure, 6 mm RUL anterior nodule 9/13 Echocardiogram- RV cavity size is mildly dilated. Systolic function is mildly reduced. RVSP 46.  LE duplex: positive RLE acute DVT  Recommendation: 1. Hypercoagulability workup especially antiphospholipid antibody syndrome If he has antiphospholipid antibodies and he will need to stay on anticoagulation for life.  If he does discontinue his anticoagulation, he still has about a significant risk of recurrent blood clots mainly due to his sedentary nature and based on the severity of the original blood clots. Patient wishes to stop anticoagulation if it is feasible.  Return to clinic in 2 weeks to discuss the results of these tests.   All questions were answered. The patient knows to call the clinic with any problems, questions or concerns.    Sabas SousGudena, Vu Liebman K, MD 01/23/16

## 2016-01-25 LAB — LUPUS ANTICOAGULANT PANEL
DRVVT MIX: 50.6 s — AB (ref 0.0–47.0)
PTT-LA: 37.4 s (ref 0.0–51.9)
dRVVT Confirm: 1.5 ratio — ABNORMAL HIGH (ref 0.8–1.2)
dRVVT: 71 s — ABNORMAL HIGH (ref 0.0–47.0)

## 2016-01-25 LAB — BETA-2-GLYCOPROTEIN I ABS, IGG/M/A: Beta-2 Glyco 1 IgM: <9 GPI IgM units (ref 0–32)

## 2016-02-06 ENCOUNTER — Ambulatory Visit (HOSPITAL_BASED_OUTPATIENT_CLINIC_OR_DEPARTMENT_OTHER): Payer: Medicare Other | Admitting: Hematology and Oncology

## 2016-02-06 ENCOUNTER — Encounter: Payer: Self-pay | Admitting: Hematology and Oncology

## 2016-02-06 DIAGNOSIS — I2692 Saddle embolus of pulmonary artery without acute cor pulmonale: Secondary | ICD-10-CM | POA: Diagnosis not present

## 2016-02-06 DIAGNOSIS — I82401 Acute embolism and thrombosis of unspecified deep veins of right lower extremity: Secondary | ICD-10-CM

## 2016-02-06 DIAGNOSIS — D6862 Lupus anticoagulant syndrome: Secondary | ICD-10-CM | POA: Diagnosis not present

## 2016-02-06 NOTE — Assessment & Plan Note (Signed)
70 y/o Male, former remote smoker (quit early 1970) who presented to [facility] ER on 9/12 with 2-3 weeks of weakness, near syncopal episode and progressive SOB. Work up positive for large/saddle PE. 9/12 CTA Chest:large saddle PE, nearly occlusive on the R, concern for R heart failure, 6 mm RUL anterior nodule 9/13 Echocardiogram- RV cavity size is mildly dilated. Systolic function is mildly reduced. RVSP 46.  LE duplex: positive RLE acute DVT  Workup 01/23/2016: Positive for presence of lupus anticoagulant Recommendation: I discussed with him the pathophysiology of lupus anticoagulant. Only persistent the placenta quite lengthy stalk to be of clinical significance. Because of this we will recheck his blood for the lupus anticoagulant in 3 months. If in 3 months it is persistently positive, then he will need to stay on anticoagulation for life.  Return to clinic in 3 months with blood work done ahead of time and follow-up.

## 2016-02-06 NOTE — Progress Notes (Signed)
No care team member to display  DIAGNOSIS:  Encounter Diagnosis  Name Primary?  . Acute saddle pulmonary embolism without acute cor pulmonale (HCC)    CHIEF COMPLIANT: Follow-up of blood work for lupus anticoagulant  INTERVAL HISTORY: Roger Ramirez is a 70 year old M1 with the diagnosis saddle pulmonary embolism and lower extremity DVT diagnosed in September 2016.. Bilateral DVTs in a saddle pulmonary embolism. Is currently on Xarelto and tolerating it very well. We performed lupus anticoagulant testing and is here today to discuss the results.  REVIEW OF SYSTEMS:   Constitutional: Denies fevers, chills or abnormal weight loss Eyes: Denies blurriness of vision Ears, nose, mouth, throat, and face: Denies mucositis or sore throat Respiratory: Denies cough, dyspnea or wheezes Cardiovascular: Denies palpitation, chest discomfort Gastrointestinal:  Denies nausea, heartburn or change in bowel habits Skin: Denies abnormal skin rashes Lymphatics: Denies new lymphadenopathy or easy bruising Neurological:Denies numbness, tingling or new weaknesses Behavioral/Psych: Mood is stable, no new changes  Extremities: No lower extremity edema All other systems were reviewed with the patient and are negative.  I have reviewed the past medical history, past surgical history, social history and family history with the patient and they are unchanged from previous note.  ALLERGIES:  has No Known Allergies.  MEDICATIONS:  Current Outpatient Prescriptions  Medication Sig Dispense Refill  . rivaroxaban (XARELTO) 20 MG TABS tablet Take 1 tablet (20 mg total) by mouth daily. 30 tablet 1   No current facility-administered medications for this visit.     PHYSICAL EXAMINATION: ECOG PERFORMANCE STATUS: 1 - Symptomatic but completely ambulatory  Vitals:   02/06/16 0821  BP: (!) 165/93  Pulse: 75  Resp: 18  Temp: 98.6 F (37 C)   Filed Weights   02/06/16 0821  Weight: 229 lb 12.8 oz (104.2 kg)     GENERAL:alert, no distress and comfortable SKIN: skin color, texture, turgor are normal, no rashes or significant lesions EYES: normal, Conjunctiva are pink and non-injected, sclera clear OROPHARYNX:no exudate, no erythema and lips, buccal mucosa, and tongue normal  NECK: supple, thyroid normal size, non-tender, without nodularity LYMPH:  no palpable lymphadenopathy in the cervical, axillary or inguinal LUNGS: clear to auscultation and percussion with normal breathing effort HEART: regular rate & rhythm and no murmurs and no lower extremity edema ABDOMEN:abdomen soft, non-tender and normal bowel sounds MUSCULOSKELETAL:no cyanosis of digits and no clubbing  NEURO: alert & oriented x 3 with fluent speech, no focal motor/sensory deficits EXTREMITIES: No lower extremity edema  LABORATORY DATA:  I have reviewed the data as listed   Chemistry      Component Value Date/Time   NA 136 12/27/2014 0518   K 4.2 12/27/2014 0518   CL 105 12/27/2014 0518   CO2 25 12/27/2014 0518   BUN 9 12/27/2014 0518   CREATININE 0.87 12/27/2014 0518      Component Value Date/Time   CALCIUM 8.4 (L) 12/27/2014 0518       Lab Results  Component Value Date   WBC 5.0 12/27/2014   HGB 11.2 (L) 12/27/2014   HCT 32.6 (L) 12/27/2014   MCV 112.8 (H) 12/27/2014   PLT 134 (L) 12/27/2014     ASSESSMENT & PLAN:  Pulmonary embolism 70 y/o Male, former remote smoker (quit early 1970) who [facility] ER on 9/12 with 2-3 weeks of weakness, near syncopal episode and progressive SOB. Work up positive for large/saddle PE. 9/12 CTA Chest:large saddle PE, nearly occlusive on the R, concern for R heart failure, 6 mm  RUL anterior nodule 9/13 Echocardiogram- RV cavity size is mildly dilated. Systolic function is mildly reduced. RVSP 46.  LE duplex: positive RLE acute DVT  Workup 01/23/2016: Positive for presence of lupus anticoagulant Recommendation: I discussed with him the pathophysiology of lupus  anticoagulant. Only persistent the placenta quite lengthy stalk to be of clinical significance. Because of this we will recheck his blood for the lupus anticoagulant in 3 months. If in 3 months it is persistently positive, then he will need to stay on anticoagulation for life.  Return to clinic in 3 months with blood work done ahead of time and follow-up.    Orders Placed This Encounter  Procedures  . Lupus anticoagulant panel    Standing Status:   Future    Standing Expiration Date:   03/12/2017   The patient has a good understanding of the overall plan. he agrees with it. he will call with any problems that may develop before the next visit here.   Sabas Sous, MD 02/06/16

## 2016-04-23 ENCOUNTER — Other Ambulatory Visit (HOSPITAL_BASED_OUTPATIENT_CLINIC_OR_DEPARTMENT_OTHER): Payer: Medicare Other

## 2016-04-23 DIAGNOSIS — I2692 Saddle embolus of pulmonary artery without acute cor pulmonale: Secondary | ICD-10-CM

## 2016-04-25 LAB — LUPUS ANTICOAGULANT PANEL
PTT-LA: 41.6 s (ref 0.0–51.9)
dRVVT Confirm: 1.5 ratio — ABNORMAL HIGH (ref 0.8–1.2)
dRVVT Mix: 56.2 s — ABNORMAL HIGH (ref 0.0–47.0)
dRVVT: 77 s — ABNORMAL HIGH (ref 0.0–47.0)

## 2016-04-30 ENCOUNTER — Ambulatory Visit: Payer: Medicare Other | Admitting: Hematology and Oncology

## 2016-05-11 NOTE — Assessment & Plan Note (Signed)
70y/o Male, former remote smoker (quit early 1970) who presented to [facility] ER on 9/12 with 2-3 weeks of weakness, near syncopal episode and progressive SOB. Work up positive for large/saddle PE. 9/12 CTA Chest:large saddle PE, nearly occlusive on the R, concern for R heart failure, 6 mm RUL anterior nodule 9/13 Echocardiogram- RV cavity size is mildly dilated. Systolic function is mildly reduced. RVSP 46.  LE duplex: positive RLE acute DVT  Workup 01/23/2016: Positive for presence of lupus anticoagulant Repeat testing 04/23/15: Positive  Plan: Indefinite anticoagulation

## 2016-05-12 ENCOUNTER — Encounter: Payer: Self-pay | Admitting: Hematology and Oncology

## 2016-05-12 ENCOUNTER — Telehealth: Payer: Self-pay | Admitting: Hematology and Oncology

## 2016-05-12 ENCOUNTER — Ambulatory Visit (HOSPITAL_BASED_OUTPATIENT_CLINIC_OR_DEPARTMENT_OTHER): Payer: Medicare Other | Admitting: Hematology and Oncology

## 2016-05-12 DIAGNOSIS — D6862 Lupus anticoagulant syndrome: Secondary | ICD-10-CM | POA: Diagnosis not present

## 2016-05-12 DIAGNOSIS — I2692 Saddle embolus of pulmonary artery without acute cor pulmonale: Secondary | ICD-10-CM

## 2016-05-12 DIAGNOSIS — I82401 Acute embolism and thrombosis of unspecified deep veins of right lower extremity: Secondary | ICD-10-CM | POA: Diagnosis not present

## 2016-05-12 DIAGNOSIS — Z7901 Long term (current) use of anticoagulants: Secondary | ICD-10-CM | POA: Diagnosis not present

## 2016-05-12 NOTE — Progress Notes (Signed)
No care team member to display  DIAGNOSIS:  Encounter Diagnosis  Name Primary?  . Acute saddle pulmonary embolism without acute cor pulmonale (HCC)    CHIEF COMPLIANT: Follow-up to review the recent blood work for lupus anticoagulant testing  INTERVAL HISTORY: Roger Ramirez is a 71 year old with above-mentioned history of acute saddle pulmonary embolism diagnosed in September 2017. He was found to have positive lupus anticoagulant. Is currently on anticoagulation. He is here for three-month follow-up after he had repeat lupus anticoagulant testing. He reports no major problems in tolerating the anticoagulant.  REVIEW OF SYSTEMS:   Constitutional: Denies fevers, chills or abnormal weight loss Eyes: Denies blurriness of vision Ears, nose, mouth, throat, and face: Denies mucositis or sore throat Respiratory: Denies cough, dyspnea or wheezes Cardiovascular: Denies palpitation, chest discomfort Gastrointestinal:  Denies nausea, heartburn or change in bowel habits Skin: Denies abnormal skin rashes Lymphatics: Denies new lymphadenopathy or easy bruising Neurological:Denies numbness, tingling or new weaknesses Behavioral/Psych: Mood is stable, no new changes  Extremities: No lower extremity edema  All other systems were reviewed with the patient and are negative.  I have reviewed the past medical history, past surgical history, social history and family history with the patient and they are unchanged from previous note.  ALLERGIES:  has No Known Allergies.  MEDICATIONS:  Current Outpatient Prescriptions  Medication Sig Dispense Refill  . rivaroxaban (XARELTO) 20 MG TABS tablet Take 1 tablet (20 mg total) by mouth daily. 30 tablet 1   No current facility-administered medications for this visit.     PHYSICAL EXAMINATION: ECOG PERFORMANCE STATUS: 0 - Asymptomatic  Vitals:   05/12/16 0806  BP: (!) 161/77  Pulse: 75  Resp: 18  Temp: 98 F (36.7 C)   Filed Weights   05/12/16  0806  Weight: 234 lb 3.2 oz (106.2 kg)    GENERAL:alert, no distress and comfortable SKIN: skin color, texture, turgor are normal, no rashes or significant lesions EYES: normal, Conjunctiva are pink and non-injected, sclera clear OROPHARYNX:no exudate, no erythema and lips, buccal mucosa, and tongue normal  NECK: supple, thyroid normal size, non-tender, without nodularity LYMPH:  no palpable lymphadenopathy in the cervical, axillary or inguinal LUNGS: clear to auscultation and percussion with normal breathing effort HEART: regular rate & rhythm and no murmurs and no lower extremity edema ABDOMEN:abdomen soft, non-tender and normal bowel sounds MUSCULOSKELETAL:no cyanosis of digits and no clubbing  NEURO: alert & oriented x 3 with fluent speech, no focal motor/sensory deficits EXTREMITIES: No lower extremity edema  LABORATORY DATA:  I have reviewed the data as listed   Chemistry      Component Value Date/Time   NA 136 12/27/2014 0518   K 4.2 12/27/2014 0518   CL 105 12/27/2014 0518   CO2 25 12/27/2014 0518   BUN 9 12/27/2014 0518   CREATININE 0.87 12/27/2014 0518      Component Value Date/Time   CALCIUM 8.4 (L) 12/27/2014 0518       Lab Results  Component Value Date   WBC 5.0 12/27/2014   HGB 11.2 (L) 12/27/2014   HCT 32.6 (L) 12/27/2014   MCV 112.8 (H) 12/27/2014   PLT 134 (L) 12/27/2014    ASSESSMENT & PLAN:  Pulmonary embolism 70y/o Male, former remote smoker (quit early 1970) who presented to [facility] ER on 9/12 with 2-3 weeks of weakness, near syncopal episode and progressive SOB. Work up positive for large/saddle PE. 9/12 CTA Chest:large saddle PE, nearly occlusive on the R, concern for R heart failure,  6 mm RUL anterior nodule 9/13 Echocardiogram- RV cavity size is mildly dilated. Systolic function is mildly reduced. RVSP 46.  LE duplex: positive RLE acute DVT  Workup 01/23/2016: Positive for presence of lupus anticoagulant Repeat testing 04/23/15:  Positive  Counseling: I discussed with the patient the mechanism of action of antiphospholipid antibodies and causing blood clots. I explained the rationale for indefinite anticoagulation. Also discussed the risks and benefits of Xarelto including the risk of mild/moderate or severe bleeding. He will inform anyone who would perform surgeries regarding the fact that he takes Xarelto.  Plan: Indefinite anticoagulation With Xarelto Return to clinic in 1 year for follow-up  I spent 15 minutes talking to the patient of which more than half was spent in counseling and coordination of care.  No orders of the defined types were placed in this encounter.  The patient has a good understanding of the overall plan. he agrees with it. he will call with any problems that may develop before the next visit here.   Sabas Sous, MD 05/12/16

## 2016-05-12 NOTE — Telephone Encounter (Signed)
Gave patient avs report and appointment for January 2019.

## 2016-08-25 ENCOUNTER — Other Ambulatory Visit: Payer: Self-pay | Admitting: Internal Medicine

## 2016-08-25 DIAGNOSIS — R911 Solitary pulmonary nodule: Secondary | ICD-10-CM

## 2016-08-27 ENCOUNTER — Ambulatory Visit
Admission: RE | Admit: 2016-08-27 | Discharge: 2016-08-27 | Disposition: A | Discharge status: Home / Self Care | Payer: Medicare Other | Source: Ambulatory Visit | Attending: Internal Medicine | Admitting: Internal Medicine

## 2016-08-27 DIAGNOSIS — R911 Solitary pulmonary nodule: Secondary | ICD-10-CM

## 2017-05-10 NOTE — Assessment & Plan Note (Signed)
72y/o Male, former remote smoker (quit early 1970) who presented to [facility] ER on 9/12 with 2-3 weeks of weakness, near syncopal episode and progressive SOB. Work up positive for large/saddle PE. 9/12 CTA Chest:large saddle PE, nearly occlusive on the R, concern for R heart failure, 6 mm RUL anterior nodule 9/13 Echocardiogram- RV cavity size is mildly dilated. Systolic function is mildly reduced. RVSP 46.  LE duplex: positive RLE acute DVT  Workup 01/23/2016: Positive for presence of lupus anticoagulant Repeat testing 04/23/15: Positive  Plan: Lifelong anticoagulation with Xarelto RTC on an as needed basis

## 2017-05-11 ENCOUNTER — Inpatient Hospital Stay: Payer: Medicare Other | Attending: Hematology and Oncology | Admitting: Hematology and Oncology

## 2017-05-11 ENCOUNTER — Telehealth: Payer: Self-pay | Admitting: Hematology and Oncology

## 2017-05-11 DIAGNOSIS — I2692 Saddle embolus of pulmonary artery without acute cor pulmonale: Secondary | ICD-10-CM | POA: Diagnosis not present

## 2017-05-11 DIAGNOSIS — Z7901 Long term (current) use of anticoagulants: Secondary | ICD-10-CM

## 2017-05-11 MED ORDER — RIVAROXABAN 20 MG PO TABS: 20.0000 mg | ORAL_TABLET | Freq: Every day | ORAL | 3 refills | Status: DC

## 2017-05-11 NOTE — Telephone Encounter (Signed)
Gave patient avs and calendar with appts per 1/29 los.  °

## 2017-05-11 NOTE — Progress Notes (Signed)
Patient Care Team: Renford Dills, MD as PCP - General (Internal Medicine)  DIAGNOSIS:  Encounter Diagnosis  Name Primary?  . Acute saddle pulmonary embolism without acute cor pulmonale (HCC)     CHIEF COMPLIANT: Follow-up of anticoagulation on Xarelto with antiphospholipid antibody syndrome  INTERVAL HISTORY: Roger Ramirez is a 72 year old with above-mentioned history of pulmonary embolism with antiphospholipid antibody syndrome who is currently on Xarelto.  Is tolerating it extremely well.  He denies any bleeding or bruising symptoms.  He continues to enjoy his life by feeding his pets which include dear, rabbits etc.  REVIEW OF SYSTEMS:   Constitutional: Denies fevers, chills or abnormal weight loss Eyes: Denies blurriness of vision Ears, nose, mouth, throat, and face: Denies mucositis or sore throat Respiratory: Denies cough, dyspnea or wheezes Cardiovascular: Denies palpitation, chest discomfort Gastrointestinal:  Denies nausea, heartburn or change in bowel habits Skin: Denies abnormal skin rashes Lymphatics: Denies new lymphadenopathy or easy bruising Neurological:Denies numbness, tingling or new weaknesses Behavioral/Psych: Mood is stable, no new changes  Extremities: No lower extremity edema  All other systems were reviewed with the patient and are negative.  I have reviewed the past medical history, past surgical history, social history and family history with the patient and they are unchanged from previous note.  ALLERGIES:  has No Known Allergies.  MEDICATIONS:  Current Outpatient Medications  Medication Sig Dispense Refill  . rivaroxaban (XARELTO) 20 MG TABS tablet Take 1 tablet (20 mg total) by mouth daily. 90 tablet 3   No current facility-administered medications for this visit.     PHYSICAL EXAMINATION: ECOG PERFORMANCE STATUS: 0 - Asymptomatic  Vitals:   05/11/17 0828  BP: (!) 163/90  Pulse: 75  Resp: 18  Temp: 98.6 F (37 C)  SpO2: 97%    Filed Weights   05/11/17 0828  Weight: 235 lb 9.6 oz (106.9 kg)    GENERAL:alert, no distress and comfortable SKIN: skin color, texture, turgor are normal, no rashes or significant lesions EYES: normal, Conjunctiva are pink and non-injected, sclera clear OROPHARYNX:no exudate, no erythema and lips, buccal mucosa, and tongue normal  NECK: supple, thyroid normal size, non-tender, without nodularity LYMPH:  no palpable lymphadenopathy in the cervical, axillary or inguinal LUNGS: clear to auscultation and percussion with normal breathing effort HEART: regular rate & rhythm and no murmurs and no lower extremity edema ABDOMEN:abdomen soft, non-tender and normal bowel sounds MUSCULOSKELETAL:no cyanosis of digits and no clubbing  NEURO: alert & oriented x 3 with fluent speech, no focal motor/sensory deficits EXTREMITIES: No lower extremity edema  LABORATORY DATA:  I have reviewed the data as listed CMP Latest Ref Rng & Units 12/27/2014 12/26/2014 12/25/2014  Glucose 65 - 99 mg/dL 161(W) 960(A) 540(J)  BUN 6 - 20 mg/dL 9 10 12   Creatinine 0.61 - 1.24 mg/dL 8.11 9.14 7.82  Sodium 135 - 145 mmol/L 136 137 139  Potassium 3.5 - 5.1 mmol/L 4.2 3.8 4.5  Chloride 101 - 111 mmol/L 105 108 112(H)  CO2 22 - 32 mmol/L 25 23 22   Calcium 8.9 - 10.3 mg/dL 9.5(A) 2.1(H) 8.1(L)    Lab Results  Component Value Date   WBC 5.0 12/27/2014   HGB 11.2 (L) 12/27/2014   HCT 32.6 (L) 12/27/2014   MCV 112.8 (H) 12/27/2014   PLT 134 (L) 12/27/2014    ASSESSMENT & PLAN:  Pulmonary embolism 72y/o Male, former remote smoker (quit early 1970) who presented to [facility] ER on 9/12 with 2-3 weeks of weakness, near syncopal episode and  progressive SOB. Work up positive for large/saddle PE. 9/12 CTA Chest:large saddle PE, nearly occlusive on the R, concern for R heart failure, 6 mm RUL anterior nodule 9/13 Echocardiogram- RV cavity size is mildly dilated. Systolic function is mildly reduced. RVSP 46.  LE duplex:  positive RLE acute DVT  Workup 01/23/2016: Positive for presence of lupus anticoagulant Repeat testing 04/23/15: Positive  Plan: Lifelong anticoagulation with Xarelto RTC in 1 year for follow-up.   I spent 25 minutes talking to the patient of which more than half was spent in counseling and coordination of care.  No orders of the defined types were placed in this encounter.  The patient has a good understanding of the overall plan. he agrees with it. he will call with any problems that may develop before the next visit here.   Tamsen Meek, MD 05/11/17

## 2018-04-13 DIAGNOSIS — I639 Cerebral infarction, unspecified: Secondary | ICD-10-CM

## 2018-04-13 HISTORY — DX: Cerebral infarction, unspecified: I63.9

## 2018-05-11 ENCOUNTER — Inpatient Hospital Stay: Payer: Medicare Other | Attending: Hematology and Oncology | Admitting: Hematology and Oncology

## 2018-05-11 NOTE — Assessment & Plan Note (Signed)
73y/o Male, former remote smoker (quit early 1970) who presented to [facility] ER on 12/24/15 with 2-3 weeks of weakness, near syncopal episode and progressive SOB. Work up positive for large/saddle PE. 12/24/15 CTA Chest:large saddle PE, nearly occlusive on the R, concern for R heart failure, 6 mm RUL anterior nodule 12/25/15 Echocardiogram- RV cavity size is mildly dilated. Systolic function is mildly reduced. RVSP 46.  LE duplex: positive RLE acute DVT  Workup 01/23/2016: Positive for presence of lupus anticoagulant Repeat testing 04/23/15: Positive  Plan: Lifelong anticoagulation with Xarelto RTC on an as needed basis

## 2018-06-06 ENCOUNTER — Other Ambulatory Visit: Payer: Self-pay

## 2018-06-06 ENCOUNTER — Encounter (HOSPITAL_COMMUNITY): Payer: Self-pay | Admitting: Emergency Medicine

## 2018-06-06 ENCOUNTER — Inpatient Hospital Stay (HOSPITAL_COMMUNITY)
Admission: EM | Admit: 2018-06-06 | Discharge: 2018-06-07 | DRG: 065 | Disposition: A | Discharge status: Home Health | Payer: Medicare Other | Attending: Internal Medicine | Admitting: Internal Medicine

## 2018-06-06 ENCOUNTER — Inpatient Hospital Stay (HOSPITAL_COMMUNITY): Payer: Medicare Other

## 2018-06-06 ENCOUNTER — Emergency Department (HOSPITAL_COMMUNITY): Payer: Medicare Other

## 2018-06-06 ENCOUNTER — Ambulatory Visit (HOSPITAL_COMMUNITY): Payer: Medicare Other

## 2018-06-06 DIAGNOSIS — E119 Type 2 diabetes mellitus without complications: Secondary | ICD-10-CM

## 2018-06-06 DIAGNOSIS — Z6833 Body mass index (BMI) 33.0-33.9, adult: Secondary | ICD-10-CM | POA: Diagnosis not present

## 2018-06-06 DIAGNOSIS — E781 Pure hyperglyceridemia: Secondary | ICD-10-CM | POA: Diagnosis present

## 2018-06-06 DIAGNOSIS — F09 Unspecified mental disorder due to known physiological condition: Secondary | ICD-10-CM

## 2018-06-06 DIAGNOSIS — Z7901 Long term (current) use of anticoagulants: Secondary | ICD-10-CM

## 2018-06-06 DIAGNOSIS — Z87891 Personal history of nicotine dependence: Secondary | ICD-10-CM

## 2018-06-06 DIAGNOSIS — E785 Hyperlipidemia, unspecified: Secondary | ICD-10-CM | POA: Diagnosis present

## 2018-06-06 DIAGNOSIS — R29703 NIHSS score 3: Secondary | ICD-10-CM | POA: Diagnosis present

## 2018-06-06 DIAGNOSIS — Z86718 Personal history of other venous thrombosis and embolism: Secondary | ICD-10-CM | POA: Diagnosis not present

## 2018-06-06 DIAGNOSIS — D6861 Antiphospholipid syndrome: Secondary | ICD-10-CM | POA: Diagnosis present

## 2018-06-06 DIAGNOSIS — E871 Hypo-osmolality and hyponatremia: Secondary | ICD-10-CM | POA: Diagnosis present

## 2018-06-06 DIAGNOSIS — I6312 Cerebral infarction due to embolism of basilar artery: Secondary | ICD-10-CM

## 2018-06-06 DIAGNOSIS — E538 Deficiency of other specified B group vitamins: Secondary | ICD-10-CM | POA: Diagnosis present

## 2018-06-06 DIAGNOSIS — R4701 Aphasia: Secondary | ICD-10-CM | POA: Diagnosis present

## 2018-06-06 DIAGNOSIS — E7800 Pure hypercholesterolemia, unspecified: Secondary | ICD-10-CM | POA: Diagnosis present

## 2018-06-06 DIAGNOSIS — I639 Cerebral infarction, unspecified: Principal | ICD-10-CM | POA: Diagnosis present

## 2018-06-06 DIAGNOSIS — E669 Obesity, unspecified: Secondary | ICD-10-CM | POA: Diagnosis present

## 2018-06-06 DIAGNOSIS — E038 Other specified hypothyroidism: Secondary | ICD-10-CM | POA: Diagnosis not present

## 2018-06-06 DIAGNOSIS — Z86711 Personal history of pulmonary embolism: Secondary | ICD-10-CM

## 2018-06-06 DIAGNOSIS — I63312 Cerebral infarction due to thrombosis of left middle cerebral artery: Secondary | ICD-10-CM

## 2018-06-06 DIAGNOSIS — Z79890 Hormone replacement therapy (postmenopausal): Secondary | ICD-10-CM | POA: Diagnosis not present

## 2018-06-06 DIAGNOSIS — E039 Hypothyroidism, unspecified: Secondary | ICD-10-CM | POA: Diagnosis present

## 2018-06-06 DIAGNOSIS — R29810 Facial weakness: Secondary | ICD-10-CM | POA: Diagnosis present

## 2018-06-06 DIAGNOSIS — E782 Mixed hyperlipidemia: Secondary | ICD-10-CM | POA: Diagnosis present

## 2018-06-06 DIAGNOSIS — Z79899 Other long term (current) drug therapy: Secondary | ICD-10-CM

## 2018-06-06 DIAGNOSIS — E1165 Type 2 diabetes mellitus with hyperglycemia: Secondary | ICD-10-CM | POA: Diagnosis present

## 2018-06-06 DIAGNOSIS — Z7984 Long term (current) use of oral hypoglycemic drugs: Secondary | ICD-10-CM | POA: Diagnosis not present

## 2018-06-06 DIAGNOSIS — I10 Essential (primary) hypertension: Secondary | ICD-10-CM | POA: Diagnosis present

## 2018-06-06 HISTORY — DX: Type 2 diabetes mellitus without complications: E11.9

## 2018-06-06 HISTORY — DX: Other pulmonary embolism without acute cor pulmonale: I26.99

## 2018-06-06 HISTORY — DX: Essential (primary) hypertension: I10

## 2018-06-06 LAB — GLUCOSE, CAPILLARY: Glucose-Capillary: 178 mg/dL — ABNORMAL HIGH (ref 70–99)

## 2018-06-06 LAB — DIFFERENTIAL
Abs Immature Granulocytes: 0.02 10*3/uL (ref 0.00–0.07)
Basophils Absolute: 0.1 10*3/uL (ref 0.0–0.1)
Basophils Relative: 1 %
Eosinophils Absolute: 0.1 10*3/uL (ref 0.0–0.5)
Eosinophils Relative: 2 %
Immature Granulocytes: 0 %
Lymphocytes Relative: 25 %
Lymphs Abs: 1.6 10*3/uL (ref 0.7–4.0)
Monocytes Absolute: 0.6 10*3/uL (ref 0.1–1.0)
Monocytes Relative: 10 %
Neutro Abs: 4 10*3/uL (ref 1.7–7.7)
Neutrophils Relative %: 62 %

## 2018-06-06 LAB — TSH: TSH: 2.751 u[IU]/mL (ref 0.350–4.500)

## 2018-06-06 LAB — URINALYSIS, ROUTINE W REFLEX MICROSCOPIC
Bacteria, UA: NONE SEEN
Bilirubin Urine: NEGATIVE
Glucose, UA: >=500 mg/dL — AB
Hgb urine dipstick: NEGATIVE
Ketones, ur: NEGATIVE mg/dL
Leukocytes,Ua: NEGATIVE
Nitrite: NEGATIVE
Protein, ur: NEGATIVE mg/dL
Specific Gravity, Urine: 1.008 (ref 1.005–1.030)
pH: 6 (ref 5.0–8.0)

## 2018-06-06 LAB — VITAMIN B12: Vitamin B-12: 86 pg/mL — ABNORMAL LOW (ref 180–914)

## 2018-06-06 LAB — COMPREHENSIVE METABOLIC PANEL
ALT: 27 U/L (ref 0–44)
AST: 34 U/L (ref 15–41)
Albumin: 3.8 g/dL (ref 3.5–5.0)
Alkaline Phosphatase: 67 U/L (ref 38–126)
Anion gap: 11 (ref 5–15)
BUN: 9 mg/dL (ref 8–23)
CO2: 22 mmol/L (ref 22–32)
Calcium: 9.5 mg/dL (ref 8.9–10.3)
Chloride: 99 mmol/L (ref 98–111)
Creatinine, Ser: 0.88 mg/dL (ref 0.61–1.24)
Glucose, Bld: 276 mg/dL — ABNORMAL HIGH (ref 70–99)
Potassium: 4.3 mmol/L (ref 3.5–5.1)
Sodium: 132 mmol/L — ABNORMAL LOW (ref 135–145)
Total Bilirubin: 0.6 mg/dL (ref 0.3–1.2)
Total Protein: 7.1 g/dL (ref 6.5–8.1)

## 2018-06-06 LAB — CBG MONITORING, ED: Glucose-Capillary: 269 mg/dL — ABNORMAL HIGH (ref 70–99)

## 2018-06-06 LAB — CBC
HCT: 44.1 % (ref 39.0–52.0)
Hemoglobin: 14.9 g/dL (ref 13.0–17.0)
MCH: 31.9 pg (ref 26.0–34.0)
MCHC: 33.8 g/dL (ref 30.0–36.0)
MCV: 94.4 fL (ref 80.0–100.0)
PLATELETS: 207 10*3/uL (ref 150–400)
RBC: 4.67 MIL/uL (ref 4.22–5.81)
RDW: 13.1 % (ref 11.5–15.5)
WBC: 6.3 10*3/uL (ref 4.0–10.5)
nRBC: 0 % (ref 0.0–0.2)

## 2018-06-06 LAB — APTT: aPTT: 29 seconds (ref 24–36)

## 2018-06-06 LAB — PROTIME-INR
INR: 1
Prothrombin Time: 12.8 seconds (ref 11.4–15.2)

## 2018-06-06 LAB — HEMOGLOBIN A1C
HEMOGLOBIN A1C: 8.5 % — AB (ref 4.8–5.6)
MEAN PLASMA GLUCOSE: 197.25 mg/dL

## 2018-06-06 LAB — ECHOCARDIOGRAM COMPLETE
Height: 68 in
Weight: 3520 oz

## 2018-06-06 LAB — I-STAT CREATININE, ED: CREATININE: 0.7 mg/dL (ref 0.61–1.24)

## 2018-06-06 MED ORDER — ACETAMINOPHEN 325 MG PO TABS: 650.0000 mg | ORAL_TABLET | ORAL | Status: DC | PRN

## 2018-06-06 MED ORDER — STROKE: EARLY STAGES OF RECOVERY BOOK
Freq: Once | Status: AC
  Administered 2018-06-06: 18:00:00
  Filled 2018-06-06: qty 1

## 2018-06-06 MED ORDER — RIVAROXABAN 20 MG PO TABS
20.0000 mg | ORAL_TABLET | Freq: Every day | ORAL | Status: DC
  Administered 2018-06-06: 20 mg via ORAL
  Filled 2018-06-06 (×2): qty 1

## 2018-06-06 MED ORDER — CYANOCOBALAMIN 1000 MCG/ML IJ SOLN: 1000.0000 ug | Freq: Once | INTRAMUSCULAR | Status: DC

## 2018-06-06 MED ORDER — SODIUM CHLORIDE 0.9% FLUSH: 3.0000 mL | Freq: Once | INTRAVENOUS | Status: DC

## 2018-06-06 MED ORDER — ATORVASTATIN CALCIUM 80 MG PO TABS
80.0000 mg | ORAL_TABLET | Freq: Every day | ORAL | Status: DC
  Administered 2018-06-06 – 2018-06-07 (×2): 80 mg via ORAL
  Filled 2018-06-06 (×2): qty 1

## 2018-06-06 MED ORDER — ACETAMINOPHEN 160 MG/5ML PO SOLN: 650.0000 mg | ORAL | Status: DC | PRN

## 2018-06-06 MED ORDER — INSULIN ASPART 100 UNIT/ML ~~LOC~~ SOLN: 0.0000 [IU] | Freq: Every day | SUBCUTANEOUS | Status: DC

## 2018-06-06 MED ORDER — SENNOSIDES-DOCUSATE SODIUM 8.6-50 MG PO TABS: 1.0000 | ORAL_TABLET | Freq: Every evening | ORAL | Status: DC | PRN

## 2018-06-06 MED ORDER — SODIUM CHLORIDE 0.9 % IV SOLN
INTRAVENOUS | Status: DC
  Administered 2018-06-06 – 2018-06-07 (×2): via INTRAVENOUS

## 2018-06-06 MED ORDER — ACETAMINOPHEN 650 MG RE SUPP: 650.0000 mg | RECTAL | Status: DC | PRN

## 2018-06-06 MED ORDER — LEVOTHYROXINE SODIUM 75 MCG PO TABS
75.0000 ug | ORAL_TABLET | Freq: Every day | ORAL | Status: DC
  Administered 2018-06-07: 75 ug via ORAL
  Filled 2018-06-06: qty 1

## 2018-06-06 MED ORDER — INSULIN ASPART 100 UNIT/ML ~~LOC~~ SOLN
0.0000 [IU] | Freq: Three times a day (TID) | SUBCUTANEOUS | Status: DC
  Administered 2018-06-07 (×2): 3 [IU] via SUBCUTANEOUS

## 2018-06-06 MED ORDER — CYANOCOBALAMIN 1000 MCG/ML IJ SOLN
1000.0000 ug | Freq: Once | INTRAMUSCULAR | Status: AC
  Administered 2018-06-06: 1000 ug via INTRAMUSCULAR
  Filled 2018-06-06: qty 1

## 2018-06-06 MED ORDER — ATORVASTATIN CALCIUM 10 MG PO TABS: 10.0000 mg | ORAL_TABLET | Freq: Every day | ORAL | Status: DC

## 2018-06-06 NOTE — Progress Notes (Signed)
SLP Cancellation Note  Patient Details Name: Travarius Bacher MRN: [id number] DOB: 08/01/1945   Cancelled treatment:       Reason Eval/Treat Not Completed: Patient at procedure or test/unavailable. SLP will follow up.    Scheryl Marten 06/06/2018, 5:26 PM

## 2018-06-06 NOTE — Consult Note (Signed)
Neurology Consultation  Reason for Consult: Code stroke Referring Physician: Dr. Rubin Payor  CC: Slurred speech, left facial droop  History is obtained from: Patient's daughter at bedside  HPI: Roger Ramirez is a 73 y.o. male past medical history of antiphospholipid antibody syndrome, saddle pulmonary embolism, currently on Xarelto, diabetes, hypertension presented to the emergency room and his daughter noted that he had left-sided facial droop and slurred speech that started around 8:45 AM today.  He woke up normal this morning going to the daughter.  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 She also reports that over the past couple of days he has been acting somewhat strangely-as an example she said that he was not able to figure out that the heat in the house was broken and did not call for help. The patient himself denies having slurred speech.  On asking him to repeat words and sentences, he heard himself and said that his speech is normal.  He was also shown his pictures on his cell phone camera and said that his face looks symmetric. He denies any preceding fevers chills.  Denies any cough shortness of breath.  Denies any palpitations chest pain.  Denies any abdominal pain distention diarrhea.  Denies nausea vomiting. Of note, daughter reports the last time when he was admitted, he had very low vitamin B levels and required shots which really helped his mentation.  He does exhibit some cognitive impairment but no formal diagnosis of dementia.  Family history of dementia in the mother.   LKW: 8:45 AM on 06/06/2018 tpa given?: no, low NIHSS and last dose of Xarelto less than 24 hours. Premorbid modified Rankin scale (mRS):0  ROS: ROS was performed and is negative except as noted in the HPI.   Past Medical History:  Diagnosis Date  . Diabetes mellitus without complication (HCC)   . Hypertension   . Pulmonary embolism (HCC)      No family history on file. Mother Alzheimer's  Social History:   reports that he has quit smoking. His smoking use included cigarettes. He has a 20.00 pack-year smoking history. He has never used smokeless tobacco. He reports current alcohol use. He reports that he does not use drugs.  Medications  Current Facility-Administered Medications:  .  sodium chloride flush (NS) 0.9 % injection 3 mL, 3 mL, Intravenous, Once, Rolan Bucco, MD  Current Outpatient Medications:  .  rivaroxaban (XARELTO) 20 MG TABS tablet, Take 1 tablet (20 mg total) by mouth daily., Disp: 90 tablet, Rfl: 3   Exam: Current vital signs: BP (!) 147/85   Pulse 72   Temp 98.4 F (36.9 C) (Oral)   Resp 20   Ht 5\' 8"  (1.727 m)   Wt 99.8 kg   SpO2 97%   BMI 33.45 kg/m  Vital signs in last 24 hours: Temp:  [98.4 F (36.9 C)] 98.4 F (36.9 C) (02/24 1054) Pulse Rate:  [72-83] 72 (02/24 1055) Resp:  [18-20] 20 (02/24 1055) BP: (147-148)/(85-95) 147/85 (02/24 1055) SpO2:  [95 %-97 %] 97 % (02/24 1055) Weight:  [99.8 kg] 99.8 kg (02/24 1057) General: Awake alert in no distress HEENT: Normocephalic atraumatic, dry mucous membranes, no thyromegaly, no bruit Lungs: Clear to auscultation no wheezing Cardiovascular: S1-S2 heard, regular rate rhythm, no murmur rub gallop Abdomen: Soft nondistended nontender Extremities: Warm well perfused with intact pulses and no edema Neurological exam Awake alert oriented to person place and time. Was  not able to say his age correctly.  He said he 89 or 73 years old man in fact he is 73 years old. Naming, comprehension repetition reading and fluency were all intact. Speech is mildly dysarthric. Cranial nerves: Pupils equal round react light, extraocular movements intact, visual fields full, mild right nasolabial fold flattening questionable, auditory acuity mildly reduced bilaterally, palate midline, tongue midline. Motor exam: 5/5 all over with no drift Sensory exam:  Intact to light touch with no extinction Coordination: No resting tremor, on finger-nose-finger testing is intention tremor bilaterally.  Intact heel-knee-shin bilaterally. Gait testing was deferred at this time  NIHSS - 3   Labs I have reviewed labs in epic and the results pertinent to this consultation are:  CBC    Component Value Date/Time   WBC 6.3 06/06/2018 1030   RBC 4.67 06/06/2018 1030   HGB 14.9 06/06/2018 1030   HCT 44.1 06/06/2018 1030   PLT 207 06/06/2018 1030   MCV 94.4 06/06/2018 1030   MCH 31.9 06/06/2018 1030   MCHC 33.8 06/06/2018 1030   RDW 13.1 06/06/2018 1030   LYMPHSABS 1.6 06/06/2018 1030   MONOABS 0.6 06/06/2018 1030   EOSABS 0.1 06/06/2018 1030   BASOSABS 0.1 06/06/2018 1030    CMP     Component Value Date/Time   NA 136 12/27/2014 0518   K 4.2 12/27/2014 0518   CL 105 12/27/2014 0518   CO2 25 12/27/2014 0518   GLUCOSE 133 (H) 12/27/2014 0518   BUN 9 12/27/2014 0518   CREATININE 0.87 12/27/2014 0518   CALCIUM 8.4 (L) 12/27/2014 0518   GFRNONAA >60 12/27/2014 0518   GFRAA >60 12/27/2014 0518  Imaging I have reviewed the images obtained:  CT-scan of the brain-chronic white matter disease, right cardiac chronic infarct.  No acute changes.   Assessment: 73 year old with past medical history of diabetes, hypertension, antiphospholipid syndrome with pulmonary emboli on Xarelto last dose of Xarelto at 4 PM yesterday, presenting for possible left facial droop and slurred speech. On my examination, I did not appreciate a left facial droop, although right nasolabial fold appeared a little flatter than the left. His speech did appear slightly dysarthric. He was unable to tell us correct age. He scored 3 points on the NIH stroke scale. Low NIH stroke scale-dose of Xarelto within 48 hours preclude him from TPA. He does have some component of cognitive impairment at baseline and it is unclear whether this is some sort of toxic metabolic encephalopathy  causing him to be off of his baseline versus a small stroke cardioembolic strokes that might of happened a few days ago since he has been not acting like himself for the past couple of days.  Advanced imaging will be helpful There was no indication to do CTA head and neck as there were no cortical signs on exam.  Impression: Dysarthria Evaluate for toxic metabolic encephalopathy Evaluate for stroke  Recommendations: MRI brain without contrast stat Also check B12 levels, TSH, A1c, RPR Further recommendations based on MRI.    -- Milon Dikes, MD [facility] Pager: [phone] If 7pm to 7am, please call on call as listed on AMION.    Addendum MRI brain completed-shows left posterior frontal subcortical white matter stroke. Would recommend admission for stroke work-up  Updated recommendations In addition to recommendations above, following is recommended: -Admit to hospitalist or observation -Telemetry monitoring -Allow for permissive hypertension for the first 24-48h - only treat PRN if SBP >220 mmHg. Blood pressures can be gradually normalized to  SBP<140 upon discharge. -Carotid dopplers -Echocardiogram -HgbA1c, fasting lipid panel -Frequent neuro checks -Prophylactic therapy-c/w Xarelto as stroke is small and possibly been present for a few days now. -Atorvastatin 80 mg PO daily -Risk factor modification -PT consult, OT consult, Speech consult  Please page stroke NP/PA/MD (listed on AMION)  from 8am-4 pm as this patient will be followed by the stroke team at this point.   -- Milon Dikes, MD [facility] Pager: [phone] If 7pm to 7am, please call on call as listed on AMION.

## 2018-06-06 NOTE — H&P (Addendum)
History and Physical    Roger Ramirez EAV:[id number] DOB: [birth year] DOA: 06/06/2018  PCP: Renford Dills, MD Consultants: Neurology Patient coming from: Home- lives alone  Chief Complaint: Altered mental status  HPI: Roger Ramirez is a 73 y.o. 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  His last known normal was around 845 this morning.  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  The daughter also reports that over the past couple of days he has not been acting like himself; his girlfriend stated that she went by his house a couple of days ago and his thermostat was not working and the temperature in the house was 51 degrees but patient did not notice and did not state that he felt cold.  Patient is a poor historian at this time due to dysarthria and altered mental status but he is able to answer some questions.  He is unaware of having any facial droop or dysarthria.  He feels that his speech is normal currently and he is not aware of having slurred speech earlier.  He has not had any recent fevers or chills or infectious symptoms.  He has had no chest pain or shortness of breath.  No palpitations.  He has had no abdominal pain, nausea, vomiting, diarrhea or constipation.  No dysuria.  He is unable to tell me what his recent blood sugars have been.  He states that he has been eating and drinking as normal.  According to the daughter the last time he was admitted he had very low vitamin B12 levels and required shots which improved his mentation.  He has had a recent history of some cognitive impairment but is never been formally assessed or diagnosed  with dementia.  ED Course: Code stroke was called upon arrival.  NIH stroke scale was 3.  He was evaluated by neurology.  He was oriented to self and place but did get his age wrong by 2 years.  He did have a very subtle right sided facial droop.  His speech was slurred.  He had upper and lower extremity tremor.  EKG revealed sinus rhythm with no signs of ischemia.  As his last dose of Xarelto was less than 24 hours prior he was not given TPA.  Head CT was reassuring.  MRI showed subcentimeter acute infarct in the left posterior frontal subcortical white matter.  MRA was negative for large or medium vessel occlusion or correctable proximal stenosis.  Internal medicine was called for admission.  Review of Systems: As per HPI; otherwise review of systems reviewed and negative.   Ambulatory Status:  Ambulates without assistance  Past Medical History:  Diagnosis Date  . Diabetes mellitus without complication (HCC)   . Hypertension   . Pulmonary embolism (HCC)     History reviewed. No pertinent surgical history.  Social History   Socioeconomic History  . Marital status: Divorced    Spouse name: Not on file  . Number of children: Not on file  . Years of education: Not on file  . Highest education level: Not on file  Occupational History  . Not on file  Social Needs  . Financial resource strain: Not on file  .  Food insecurity:    Worry: Not on file    Inability: Not on file  . Transportation needs:    Medical: Not on file    Non-medical: Not on file  Tobacco Use  . Smoking status: Former Smoker    Packs/day: 1.00    Years: 20.00    Pack years: 20.00    Types: Cigarettes  . Smokeless tobacco: Never Used  . Tobacco comment: Quit 1970's  Substance and Sexual Activity  . Alcohol use: Yes    Alcohol/week: 0.0 standard drinks    Comment: Occasional beer / 1 every 2 or 3 weeks  . Drug use: No  . Sexual activity: Not on file  Lifestyle  . Physical activity:    Days per week: Not  on file    Minutes per session: Not on file  . Stress: Not on file  Relationships  . Social connections:    Talks on phone: Not on file    Gets together: Not on file    Attends religious service: Not on file    Active member of club or organization: Not on file    Attends meetings of clubs or organizations: Not on file    Relationship status: Not on file  . Intimate partner violence:    Fear of current or ex partner: Not on file    Emotionally abused: Not on file    Physically abused: Not on file    Forced sexual activity: Not on file  Other Topics Concern  . 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  Prior to Admission medications   Medication Sig Start Date End Date Taking? Authorizing Provider  atorvastatin (LIPITOR) 10 MG tablet Take 10 mg by mouth daily. 06/06/18  Yes [provider]  ibuprofen (ADVIL,MOTRIN) 800 MG tablet Take 800 mg by mouth every 8 (eight) hours as needed for moderate pain.    Yes [provider]  irbesartan (AVAPRO) 150 MG tablet Take 75 mg by mouth daily. 03/29/18  Yes [provider]  levothyroxine (SYNTHROID, LEVOTHROID) 75 MCG tablet Take 75 mcg by mouth every morning. 04/12/18  Yes [provider]  metFORMIN (GLUCOPHAGE) 1000 MG tablet Take 1,000 mg by mouth 2 (two) times daily. 06/06/18  Yes [provider]  rivaroxaban (XARELTO) 20 MG TABS tablet Take 1 tablet (20 mg total) by mouth daily. 05/11/17  Yes Serena Croissant, MD    Physical Exam: Vitals:   06/06/18 1215 06/06/18 1230 06/06/18 1444 06/06/18 1644  BP: 127/72 (!) 145/71 (!) 173/78 (!) 179/81  Pulse: 67 65 72 (!) 54  Resp: 20 (!) 23 20 18   Temp:    98.6 F (37 C)  TempSrc:    Oral  SpO2: 97% 97% 98% 98%  Weight:      Height:         . General: Appears calm and comfortable and is in  NAD . Eyes:  PERRL, EOMI, normal lids, iris . ENT:  grossly normal hearing, lips & tongue, mmm . Neck:  supple, no lymphadenopathy . Cardiovascular:  nL S1, S2, normal rate, reg rhythm, no murmur. Marland Kitchen Respiratory:   CTA bilaterally with no wheezes/rales/rhonchi.  Normal respiratory effort. . Abdomen:  soft, NT, ND, NABS . Back:   grossly normal alignment . Skin:  no  rash or lesions seen on limited exam . Musculoskeletal:  grossly normal tone BUE/BLE, good ROM, no bony abnormality or obvious joint deformity . Lower extremities: No LE edema.  Limited foot exam with no ulcerations.  2+ distal pulses. Marland Kitchen Psychiatric:  grossly normal mood and affect, speech fluent and appropriate, AOx3 . Neurologic: Speech is dysarthric, patient is oriented to self, place, time, situation.  There is a very slight right nasolabial fold flattening.  CN 2-12 otherwise grossly intact, moves all extremities in coordinated fashion, bilateral upper extremity intention tremor.  Sensation intact, Patellar DTRs 2+ and symmetric    Radiological Exams on Admission: Mr Shirlee Latch ZO Contrast  Result Date: 06/06/2018 CLINICAL DATA:  Slurred speech and facial droop beginning 0845 hours today. EXAM: MRI HEAD WITHOUT CONTRAST MRA HEAD WITHOUT CONTRAST TECHNIQUE: Multiplanar, multiecho pulse sequences of the brain and surrounding structures were obtained without intravenous contrast. Angiographic images of the head were obtained using MRA technique without contrast. COMPARISON:  Head CT earlier same day FINDINGS: MRI HEAD FINDINGS Brain: Subcentimeter acute infarction in the left posterior frontal subcortical white matter. No cortical infarction. No mass lesion, hemorrhage, hydrocephalus or extra-axial collection. Elsewhere, the brain shows generalized atrophy and chronic small-vessel ischemic changes of the white matter. Vascular: Major vessels at the base of the brain show flow. Skull and upper cervical spine: Negative Sinuses/Orbits:  Clear/normal Other: None MRA HEAD FINDINGS Both internal carotid arteries are widely patent into the brain. No siphon stenosis. The anterior and middle cerebral vessels are patent without proximal stenosis, aneurysm or vascular malformation. Both vertebral arteries are widely patent to the basilar. No basilar stenosis. Posterior circulation branch vessels appear normal. IMPRESSION: Subcentimeter acute infarction in the left posterior frontal subcortical white matter. Atrophy and chronic small-vessel ischemic changes elsewhere throughout the brain. Negative MRA for large or medium vessel occlusion or correctable proximal stenosis. Electronically Signed   By: Paulina Fusi M.D.   On: 06/06/2018 14:07   Mr Brain Wo Contrast  Result Date: 06/06/2018 CLINICAL DATA:  Slurred speech and facial droop beginning 0845 hours today. EXAM: MRI HEAD WITHOUT CONTRAST MRA HEAD WITHOUT CONTRAST TECHNIQUE: Multiplanar, multiecho pulse sequences of the brain and surrounding structures were obtained without intravenous contrast. Angiographic images of the head were obtained using MRA technique without contrast. COMPARISON:  Head CT earlier same day FINDINGS: MRI HEAD FINDINGS Brain: Subcentimeter acute infarction in the left posterior frontal subcortical white matter. No cortical infarction. No mass lesion, hemorrhage, hydrocephalus or extra-axial collection. Elsewhere, the brain shows generalized atrophy and chronic small-vessel ischemic changes of the white matter. Vascular: Major vessels at the base of the brain show flow. Skull and upper cervical spine: Negative Sinuses/Orbits: Clear/normal Other: None MRA HEAD FINDINGS Both internal carotid arteries are widely patent into the brain. No siphon stenosis. The anterior and middle cerebral vessels are patent without proximal stenosis, aneurysm or vascular malformation. Both vertebral arteries are widely patent to the basilar. No basilar stenosis. Posterior circulation branch vessels  appear normal. IMPRESSION: Subcentimeter acute infarction in the left posterior frontal subcortical white matter. Atrophy and chronic small-vessel ischemic changes elsewhere throughout the brain. Negative MRA for large or medium vessel occlusion or correctable proximal stenosis. Electronically Signed   By: Paulina Fusi M.D.   On: 06/06/2018 14:07   Dg Chest Portable 1 View  Result Date: 06/06/2018 CLINICAL DATA:  Slurred speech and right facial droop this morning. EXAM: PORTABLE CHEST 1 VIEW COMPARISON:  Chest CT 08/27/2016 FINDINGS: The heart is borderline enlarged but  stable. Stable tortuosity and calcification of the thoracic aorta. The lungs are clear. No worrisome pulmonary lesions. No pleural effusion. The bony thorax is intact. IMPRESSION: No acute cardiopulmonary findings. Electronically Signed   By: Rudie Meyer M.D.   On: 06/06/2018 11:20   Ct Head Code Stroke Wo Contrast  Result Date: 06/06/2018 CLINICAL DATA:  Code stroke. Slurred speech. Possible stroke. Focal neuro deficit of less than 6 hours. EXAM: CT HEAD WITHOUT CONTRAST TECHNIQUE: Contiguous axial images were obtained from the base of the skull through the vertex without intravenous contrast. COMPARISON:  None. FINDINGS: Brain: Moderate atrophy and diffuse periventricular white matter disease is present bilaterally. Basal ganglia are intact. Insular ribbon is normal. A lacunar infarct of the right caudate head appears remote. No acute or focal cortical infarcts are present. The ventricles are of proportionate to the degree of atrophy. No significant extraaxial fluid collection is present. The brainstem and cerebellum are within normal limits. Vascular: Atherosclerotic calcifications are present within the cavernous internal carotid arteries. There is no hyperdense vessel. Skull: Calvarium is intact. No focal lytic or blastic lesions are present. Sinuses/Orbits: The paranasal sinuses and mastoid air cells are clear. The globes and orbits  are within normal limits. ASPECTS [facility] Stroke Program Early CT Score) - Ganglionic level infarction (caudate, lentiform nuclei, internal capsule, insula, M1-M3 cortex): 7/7 - Supraganglionic infarction (M4-M6 cortex): 3/3 Total score (0-10 with 10 being normal): 10/10 IMPRESSION: 1. No acute intracranial abnormality. 2. Atrophy and white matter disease appears chronic. 3. Lacunar infarct of the right caudate head appears remote. 4. ASPECTS is 10/10 The above was relayed via text pager to Dr. Wilford Corner on 06/06/2018 at 10:48 . Electronically Signed   By: Marin Roberts M.D.   On: 06/06/2018 10:49   Vas US Carotid (at [facility])  Result Date: 06/06/2018 Carotid Arterial Duplex Study Indications:       CVA and Speech disturbance. Risk Factors:      Hypertension, Diabetes. Other Factors:     History of PE. Limitations:       Patient's labored breathing. Comparison Study:  No comparison study available. Performing Technologist: Melodie Bouillon  Examination Guidelines: A complete evaluation includes B-mode imaging, spectral Doppler, color Doppler, and power Doppler as needed of all accessible portions of each vessel. Bilateral testing is considered an integral part of a complete examination. Limited examinations for reoccurring indications may be performed as noted.  Right Carotid Findings: +----------+--------+--------+--------+-------------------------------+--------+           PSV cm/sEDV cm/sStenosisDescribe                       Comments +----------+--------+--------+--------+-------------------------------+--------+ CCA Prox  65      12                                                      +----------+--------+--------+--------+-------------------------------+--------+ CCA Distal59      16              diffuse, heterogenous and                                                 smooth                                   +----------+--------+--------+--------+-------------------------------+--------+  ICA Prox  47      17      1-39%   diffuse, calcific and irregular         +----------+--------+--------+--------+-------------------------------+--------+ ICA Mid   103     29      1-39%   diffuse and calcific                    +----------+--------+--------+--------+-------------------------------+--------+ ICA Distal76      19                                                      +----------+--------+--------+--------+-------------------------------+--------+ ECA       123     10                                                      +----------+--------+--------+--------+-------------------------------+--------+ +----------+--------+-------+--------+-------------------+           PSV cm/sEDV cmsDescribeArm Pressure (mmHG) +----------+--------+-------+--------+-------------------+ [id number]                                         +----------+--------+-------+--------+-------------------+ +---------+--------+--+--------+-+---------+ VertebralPSV cm/s34EDV cm/s9Antegrade +---------+--------+--+--------+-+---------+  Left Carotid Findings: +----------+--------+--------+--------+---------------------+------------------+           PSV cm/sEDV cm/sStenosisDescribe             Comments           +----------+--------+--------+--------+---------------------+------------------+ CCA Prox  83      16                                                      +----------+--------+--------+--------+---------------------+------------------+ CCA Distal57      16                                   intimal thickening +----------+--------+--------+--------+---------------------+------------------+ ICA Prox  66      20      1-39%   diffuse, calcific and                                                     irregular                                +----------+--------+--------+--------+---------------------+------------------+ ICA Mid   76      18                                                      +----------+--------+--------+--------+---------------------+------------------+ ICA Distal83      17                                                      +----------+--------+--------+--------+---------------------+------------------+  ECA       152     19                                                      +----------+--------+--------+--------+---------------------+------------------+ +----------+--------+--------+--------+-------------------+ SubclavianPSV cm/sEDV cm/sDescribeArm Pressure (mmHG) +----------+--------+--------+--------+-------------------+           117                                         +----------+--------+--------+--------+-------------------+ +---------+--------+--+--------+--+---------+ VertebralPSV cm/s54EDV cm/s17Antegrade +---------+--------+--+--------+--+---------+  Summary: Right Carotid: Velocities in the right ICA are consistent with a 1-39% stenosis. Left Carotid: Velocities in the left ICA are consistent with a 1-39% stenosis. Vertebrals: Bilateral vertebral arteries demonstrate antegrade flow. *See table(s) above for measurements and observations.     Preliminary     EKG: Independently reviewed.  Rate 74.  Sinus rhythm.   Labs on Admission: I have personally reviewed the available labs and imaging studies at the time of the admission.  Pertinent labs:  CMP remarkable for sodium of 132, glucose of 276.  Creatinine was at his baseline at 0.88.  LFTs were within normal limits. Vitamin B12 was low at 86 CBC was unremarkable INR 1.0   Assessment/Plan Principal Problem:   CVA (cerebral vascular accident) (HCC) Active Problems:   History of pulmonary embolus (PE)   Hypercholesterolemia with hypertriglyceridemia   B12 deficiency   Hypothyroid   Cognitive dysfunction    Antiphospholipid syndrome (HCC)   Type 2 diabetes mellitus without complication (HCC)  Acute CVA: Acute left posterior frontal subcortical white matter infarct -Admit to hospitalist service, medical telemetry -Allow permissive hypertension for the first 24 to 48 hours; PRN antihypertensives only if SBP greater than 220 mmHg.  Plan to gradually normalize blood pressure by the time of discharge. -Follow-up carotid Dopplers -Follow-up echocardiogram -Follow-up hemoglobin A1c, fasting lipid -Frequent neuro checks -Continue with Xarelto for prophylaxis; stroke is small and has possibly been present for a few days according to neurology -Increase atorvastatin to 80 mg a day -Risk factor modification -PT, OT, ST -N.p.o. until passes swallow study  Antiphospholipid syndrome with history of saddle PE -Continue Xarelto  Diabetes mellitus type 2, not on insulin at home -Hold metformin -Diabetic diet once passes swallow, SSI  Essential hypertension -Hold home BP meds to allow for permissive hypertension as above  Hypothyroidism -Continue home levothyroxine  Vitamin B12 deficiency -will replace B12 today with 1000 mcg IM; will need weekly x one month followed by 1000 mcg once per month. -unclear reason for this as pt does not have any known conditions that would decrease absorption and pt is not on a vegan or reduced animal product diet.  Cognitive dysfunction, possibly due to vitamin B12 deficiency -will replace B12 today with 1000 mcg IM; will need weekly x one month followed by 1000 mcg once per month. -Follow-up as outpatient   DVT prophylaxis: Xarelto Code Status:  Full - confirmed with patient/family Family Communication: Wife and daughter at bedside Disposition Plan:  Home once clinically improved Consults called: Neurology Admission status: Admit - It is my clinical opinion that admission to INPATIENT is reasonable and necessary because of the expectation that this patient will  require hospital care that crosses at least 2 midnights to  treat this condition based on the medical complexity of the problems presented.  Given the aforementioned information, the predictability of an adverse outcome is felt to be significant.     Elyse Hsu MD [facility]  If note is complete, please contact covering daytime or nighttime physician. [email or web address] Password [facility]  06/06/2018, 6:58 PM

## 2018-06-06 NOTE — Code Documentation (Signed)
72 yo male coming from home where he was with his girlfriend. Pt has hx of PE and former smoker. Pt was noted to leave the house this morning at 0830 to feed the chickens. When he returned at 0845, he was noted to have slurred speech and a slight facial droop. Girlfriend called daughter and the daughter brought the patient to the ED. Triage activated a Code Stroke. Stroke Team met patient in CT. Initial NIHSS 3 due to slight right facial droop, dysarthria, and inability to state his correct age. Not a tPA candidate due to being on Xarelto and taking his last dose at 1600 yesterday afternoon. Pt to be admitted and receive MRI. No LVO symptoms noted. Handoff given to Katie,RN. 

## 2018-06-06 NOTE — ED Provider Notes (Signed)
[facility] EMERGENCY DEPARTMENT Provider Note   CSN: [id number] Arrival date & time: 06/06/18  1025  An emergency department physician performed an initial assessment on this suspected stroke patient at 29.  History   Chief Complaint Chief Complaint  Patient presents with  . Aphasia    HPI Alem Fahl is a 73 y.o. male.     HPI Patient brought in with slurred speech and facial droop.  First noticed at 845 this morning but not quite clear he was normal before this.  No headache.  Patient's daughter noticed that the symptoms were not his baseline.  But also reportedly had had some confusion and that yesterday his girlfriend was at the house and the house is 52 degrees and he did not notice that it was cold.  He is on anticoagulation for previous pulmonary embolisms.  No chest pain.  No dysuria.  Patient states he does not notice that his speech is slurred but patient's daughter states that the speech is not his baseline.  Code stroke had been called by Dr. Tamera Punt, who met the patient upon initial arrival. Past Medical History:  Diagnosis Date  . Diabetes mellitus without complication ([place])   . Hypertension   . Pulmonary embolism [facility])     Patient Active Problem List   Diagnosis Date Noted  . Hypercholesterolemia with hypertriglyceridemia 01/23/2016  . Impaired fasting glucose 01/23/2016  . B12 deficiency 01/23/2016  . Hypothyroid 01/23/2016  . Cognitive dysfunction 01/23/2016  . Pleuritic chest pain   . Leg DVT (deep venous thromboembolism), acute ([place])   . Near syncope   . Elevated BP   . PE (pulmonary embolism)   . Pulmonary embolism ([place]) 12/24/2014    History reviewed. No pertinent surgical history.      Home Medications    Prior to Admission medications   Medication Sig Start Date End Date Taking? Authorizing Provider  atorvastatin (LIPITOR) 10 MG tablet Take 10 mg by mouth daily. 06/06/18  Yes [provider]  ibuprofen  (ADVIL,MOTRIN) 800 MG tablet Take 800 mg by mouth every 8 (eight) hours as needed for moderate pain.    Yes [provider]  irbesartan (AVAPRO) 150 MG tablet Take 75 mg by mouth daily. 03/29/18  Yes [provider]  levothyroxine (SYNTHROID, LEVOTHROID) 75 MCG tablet Take 75 mcg by mouth every morning. 04/12/18  Yes [provider]  metFORMIN (GLUCOPHAGE) 1000 MG tablet Take 1,000 mg by mouth 2 (two) times daily. 06/06/18  Yes [provider]  rivaroxaban (XARELTO) 20 MG TABS tablet Take 1 tablet (20 mg total) by mouth daily. 05/11/17  Yes Nicholas Lose, MD    Family History No family history on file.  Social History Social History   Tobacco Use  . Smoking status: Former Smoker    Packs/day: 1.00    Years: 20.00    Pack years: 20.00    Types: Cigarettes  . Smokeless tobacco: Never Used  . Tobacco comment: Quit 1970's  Substance Use Topics  . Alcohol use: Yes    Alcohol/week: 0.0 standard drinks    Comment: Occasional beer / 1 every 2 or 3 weeks  . Drug use: No     Allergies   Patient has no known allergies.   Review of Systems Review of Systems  Constitutional: Negative for appetite change.  HENT: Negative for congestion.   Respiratory: Negative for choking.   Gastrointestinal: Negative for abdominal pain.  Genitourinary: Negative for flank pain.  Musculoskeletal: Negative for back  pain.  Skin: Negative for rash.  Neurological: Positive for facial asymmetry and speech difficulty.  Psychiatric/Behavioral: Positive for confusion.     Physical Exam Updated Vital Signs BP (!) 145/71   Pulse 65   Temp 98.4 F (36.9 C) (Oral)   Resp (!) 23   Ht 5' 8"  (1.727 m)   Wt 99.8 kg   SpO2 97%   BMI 33.45 kg/m   Physical Exam HENT:     Head:     Comments: Right-sided lower facial droop.    Nose: Nose normal.  Eyes:     Extraocular Movements: Extraocular movements intact.     Pupils: Pupils are equal, round, and reactive to light.    Cardiovascular:     Rate and Rhythm: Normal rate and regular rhythm.  Pulmonary:     Breath sounds: Normal breath sounds.  Abdominal:     Tenderness: There is no abdominal tenderness.  Musculoskeletal:        General: No tenderness.  Skin:    General: Skin is warm.  Neurological:     Mental Status: He is alert.     Comments: Right-sided lower facial droop.  Somewhat slurred speech.  Tremor on upper and lower extremities.  Awake and overall appropriate although did have difficulty initially giving his age.  Complete NIH scoring done by neurology.      ED Treatments / Results  Labs (all labs ordered are listed, but only abnormal results are displayed) Labs Reviewed  COMPREHENSIVE METABOLIC PANEL - Abnormal; Notable for the following components:      Result Value   Sodium 132 (*)    Glucose, Bld 276 (*)    All other components within normal limits  VITAMIN B12 - Abnormal; Notable for the following components:   Vitamin B-12 86 (*)    All other components within normal limits  HEMOGLOBIN A1C - Abnormal; Notable for the following components:   Hgb A1c MFr Bld 8.5 (*)    All other components within normal limits  CBG MONITORING, ED - Abnormal; Notable for the following components:   Glucose-Capillary 269 (*)    All other components within normal limits  PROTIME-INR  APTT  CBC  DIFFERENTIAL  TSH  URINALYSIS, ROUTINE W REFLEX MICROSCOPIC  RPR  I-STAT CREATININE, ED    EKG EKG Interpretation  Date/Time:  Monday June 06 2018 10:52:07 EST Ventricular Rate:  74 PR Interval:    QRS Duration: 104 QT Interval:  374 QTC Calculation: 415 R Axis:   73 Text Interpretation:  Sinus rhythm Confirmed by Davonna Belling [id number]) on 06/06/2018 11:44:46 AM   Radiology Mr Jodene Nam Head Wo Contrast  Result Date: 06/06/2018 CLINICAL DATA:  Slurred speech and facial droop beginning 0845 hours today. EXAM: MRI HEAD WITHOUT CONTRAST MRA HEAD WITHOUT CONTRAST TECHNIQUE: Multiplanar,  multiecho pulse sequences of the brain and surrounding structures were obtained without intravenous contrast. Angiographic images of the head were obtained using MRA technique without contrast. COMPARISON:  Head CT earlier same day FINDINGS: MRI HEAD FINDINGS Brain: Subcentimeter acute infarction in the left posterior frontal subcortical white matter. No cortical infarction. No mass lesion, hemorrhage, hydrocephalus or extra-axial collection. Elsewhere, the brain shows generalized atrophy and chronic small-vessel ischemic changes of the white matter. Vascular: Major vessels at the base of the brain show flow. Skull and upper cervical spine: Negative Sinuses/Orbits: Clear/normal Other: None MRA HEAD FINDINGS Both internal carotid arteries are widely patent into the brain. No siphon stenosis. The anterior and middle cerebral vessels are  patent without proximal stenosis, aneurysm or vascular malformation. Both vertebral arteries are widely patent to the basilar. No basilar stenosis. Posterior circulation branch vessels appear normal. IMPRESSION: Subcentimeter acute infarction in the left posterior frontal subcortical white matter. Atrophy and chronic small-vessel ischemic changes elsewhere throughout the brain. Negative MRA for large or medium vessel occlusion or correctable proximal stenosis. Electronically Signed   By: Nelson Chimes M.D.   On: 06/06/2018 14:07   Mr Brain Wo Contrast  Result Date: 06/06/2018 CLINICAL DATA:  Slurred speech and facial droop beginning 0845 hours today. EXAM: MRI HEAD WITHOUT CONTRAST MRA HEAD WITHOUT CONTRAST TECHNIQUE: Multiplanar, multiecho pulse sequences of the brain and surrounding structures were obtained without intravenous contrast. Angiographic images of the head were obtained using MRA technique without contrast. COMPARISON:  Head CT earlier same day FINDINGS: MRI HEAD FINDINGS Brain: Subcentimeter acute infarction in the left posterior frontal subcortical white matter. No  cortical infarction. No mass lesion, hemorrhage, hydrocephalus or extra-axial collection. Elsewhere, the brain shows generalized atrophy and chronic small-vessel ischemic changes of the white matter. Vascular: Major vessels at the base of the brain show flow. Skull and upper cervical spine: Negative Sinuses/Orbits: Clear/normal Other: None MRA HEAD FINDINGS Both internal carotid arteries are widely patent into the brain. No siphon stenosis. The anterior and middle cerebral vessels are patent without proximal stenosis, aneurysm or vascular malformation. Both vertebral arteries are widely patent to the basilar. No basilar stenosis. Posterior circulation branch vessels appear normal. IMPRESSION: Subcentimeter acute infarction in the left posterior frontal subcortical white matter. Atrophy and chronic small-vessel ischemic changes elsewhere throughout the brain. Negative MRA for large or medium vessel occlusion or correctable proximal stenosis. Electronically Signed   By: Nelson Chimes M.D.   On: 06/06/2018 14:07   Dg Chest Portable 1 View  Result Date: 06/06/2018 CLINICAL DATA:  Slurred speech and right facial droop this morning. EXAM: PORTABLE CHEST 1 VIEW COMPARISON:  Chest CT 08/27/2016 FINDINGS: The heart is borderline enlarged but stable. Stable tortuosity and calcification of the thoracic aorta. The lungs are clear. No worrisome pulmonary lesions. No pleural effusion. The bony thorax is intact. IMPRESSION: No acute cardiopulmonary findings. Electronically Signed   By: Marijo Sanes M.D.   On: 06/06/2018 11:20   Ct Head Code Stroke Wo Contrast  Result Date: 06/06/2018 CLINICAL DATA:  Code stroke. Slurred speech. Possible stroke. Focal neuro deficit of less than 6 hours. EXAM: CT HEAD WITHOUT CONTRAST TECHNIQUE: Contiguous axial images were obtained from the base of the skull through the vertex without intravenous contrast. COMPARISON:  None. FINDINGS: Brain: Moderate atrophy and diffuse periventricular  white matter disease is present bilaterally. Basal ganglia are intact. Insular ribbon is normal. A lacunar infarct of the right caudate head appears remote. No acute or focal cortical infarcts are present. The ventricles are of proportionate to the degree of atrophy. No significant extraaxial fluid collection is present. The brainstem and cerebellum are within normal limits. Vascular: Atherosclerotic calcifications are present within the cavernous internal carotid arteries. There is no hyperdense vessel. Skull: Calvarium is intact. No focal lytic or blastic lesions are present. Sinuses/Orbits: The paranasal sinuses and mastoid air cells are clear. The globes and orbits are within normal limits. ASPECTS [facility] Stroke Program Early CT Score) - Ganglionic level infarction (caudate, lentiform nuclei, internal capsule, insula, M1-M3 cortex): 7/7 - Supraganglionic infarction (M4-M6 cortex): 3/3 Total score (0-10 with 10 being normal): 10/10 IMPRESSION: 1. No acute intracranial abnormality. 2. Atrophy and white matter disease appears chronic. 3. Lacunar  infarct of the right caudate head appears remote. 4. ASPECTS is 10/10 The above was relayed via text pager to Dr. Rory Percy on 06/06/2018 at 10:48 . Electronically Signed   By: San Morelle M.D.   On: 06/06/2018 10:49    Procedures Procedures (including critical care time)  Medications Ordered in ED Medications  sodium chloride flush (NS) 0.9 % injection 3 mL (3 mLs Intravenous Not Given 06/06/18 1053)     Initial Impression / Assessment and Plan / ED Course  I have reviewed the triage vital signs and the nursing notes.  Pertinent labs & imaging results that were available during my care of the patient were reviewed by me and considered in my medical decision making (see chart for details).        Patient with neuro deficits.  Some started today but also had some confusion the last day or 2.  Head CT reassuring.  MRI done and showed subacute stroke.   Has been seen by neurology.  Patient is already on anticoagulation.  Will admit to internal medicine.  Not a TPA candidate due to time of onset, not large vessel, and already on anticoagulation.  Final Clinical Impressions(s) / ED Diagnoses   Final diagnoses:  Cerebral infarction, unspecified mechanism [facility])    ED Discharge Orders    None       Davonna Belling, MD 06/06/18 1431

## 2018-06-06 NOTE — Progress Notes (Addendum)
received patient from ED at 1630

## 2018-06-06 NOTE — Progress Notes (Signed)
  Echocardiogram 2D Echocardiogram has been performed.  Delcie Roch 06/06/2018, 5:25 PM

## 2018-06-06 NOTE — ED Triage Notes (Signed)
Patient brought in by daughter stating patient began slurred speech and right sided facial droop onset of 8:45 am. Patient has hx of PE and on Xarelto.

## 2018-06-06 NOTE — ED Notes (Signed)
RN informed ok to send visitor back 

## 2018-06-06 NOTE — Progress Notes (Signed)
Obtained report from ED, patient on way to 3w-26

## 2018-06-06 NOTE — ED Notes (Signed)
Pt. Made aware that sample is needed, urinal at bedside

## 2018-06-06 NOTE — Progress Notes (Signed)
Carotid duplex exam completed. Please see preliminary notes on CV PROC under chart review. Magdalynn Davilla H Linwood Gullikson(RDMS RVT) 06/06/18 5:59 PM

## 2018-06-06 NOTE — ED Notes (Signed)
Patient transported to MRI 

## 2018-06-06 NOTE — ED Notes (Signed)
MRI called for stat imaging. States scanner not open.

## 2018-06-07 DIAGNOSIS — F09 Unspecified mental disorder due to known physiological condition: Secondary | ICD-10-CM

## 2018-06-07 DIAGNOSIS — E538 Deficiency of other specified B group vitamins: Secondary | ICD-10-CM

## 2018-06-07 DIAGNOSIS — I639 Cerebral infarction, unspecified: Secondary | ICD-10-CM

## 2018-06-07 DIAGNOSIS — E038 Other specified hypothyroidism: Secondary | ICD-10-CM

## 2018-06-07 DIAGNOSIS — D6861 Antiphospholipid syndrome: Secondary | ICD-10-CM

## 2018-06-07 DIAGNOSIS — Z86711 Personal history of pulmonary embolism: Secondary | ICD-10-CM

## 2018-06-07 DIAGNOSIS — I63312 Cerebral infarction due to thrombosis of left middle cerebral artery: Secondary | ICD-10-CM

## 2018-06-07 LAB — GLUCOSE, CAPILLARY
Glucose-Capillary: 190 mg/dL — ABNORMAL HIGH (ref 70–99)
Glucose-Capillary: 190 mg/dL — ABNORMAL HIGH (ref 70–99)

## 2018-06-07 LAB — LIPID PANEL
Cholesterol: 152 mg/dL (ref 0–200)
HDL: 38 mg/dL — ABNORMAL LOW (ref >40)
LDL Cholesterol: 56 mg/dL (ref 0–99)
Total CHOL/HDL Ratio: 4 RATIO
Triglycerides: 289 mg/dL — ABNORMAL HIGH (ref <150)
VLDL: 58 mg/dL — ABNORMAL HIGH (ref 0–40)

## 2018-06-07 LAB — HEMOGLOBIN A1C
Hgb A1c MFr Bld: 8.4 % — ABNORMAL HIGH (ref 4.8–5.6)
Mean Plasma Glucose: 194.38 mg/dL

## 2018-06-07 LAB — RPR: RPR Ser Ql: NONREACTIVE

## 2018-06-07 MED ORDER — VITAMIN B-12 1000 MCG PO TABS: 1000.0000 ug | ORAL_TABLET | Freq: Every day | ORAL | Status: DC

## 2018-06-07 MED ORDER — ASPIRIN EC 81 MG PO TBEC: 81.0000 mg | DELAYED_RELEASE_TABLET | Freq: Every day | ORAL | 2 refills | Status: AC

## 2018-06-07 MED ORDER — CYANOCOBALAMIN 1000 MCG PO TABS: 1000.0000 ug | ORAL_TABLET | Freq: Every day | ORAL | 0 refills | Status: DC

## 2018-06-07 MED ORDER — ATORVASTATIN CALCIUM 10 MG PO TABS: 10.0000 mg | ORAL_TABLET | Freq: Every day | ORAL | Status: DC

## 2018-06-07 MED ORDER — BLOOD GLUCOSE MONITOR KIT: PACK | 0 refills | Status: DC

## 2018-06-07 MED ORDER — VITAMIN B-12 100 MCG PO TABS: 100.0000 ug | ORAL_TABLET | Freq: Every day | ORAL | Status: DC

## 2018-06-07 MED ORDER — CYANOCOBALAMIN 1000 MCG/ML IJ SOLN
1000.0000 ug | Freq: Once | INTRAMUSCULAR | Status: AC
  Administered 2018-06-07: 1000 ug via INTRAMUSCULAR
  Filled 2018-06-07: qty 1

## 2018-06-07 NOTE — Evaluation (Signed)
Occupational Therapy Evaluation Patient Details Name: Callen Rawling MRN: [id number] DOB: [birth year] Today's Date: 06/07/2018    History of Present Illness Draper Vallis is a 73 y.o. 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   MRI showed Subcentimeter acute infarction in the left posterior frontal  subcortical white matter.   Clinical Impression   Patient presenting with decreased I in self care, balance, functional mobility/transfers, endurance, and strength. Pt impulsive with movement this session requiring supervision and min cuing for safety. Pt given pants and underwear but having difficulty sequencing and problem solving how to don. Pt ambulating to bathroom with supervision and urinating on pants and attempting to ambulating with pants unfastened creating a fall risk. Family present in room to witness.  Patient lives at home alone PTA with some cognitive deficits at baseline but has been functioning "ok" per family. Pt also drives occasionally.  Patient currently functioning S - min guard overall for safety. Patient will benefit from acute OT to increase overall independence in the areas of ADLs, functional mobility, and safety awareness in order to safely discharge home with caregivers. OT explained need for 24/7 based on pt's currently level for safety. HHOT to assess pt in home environment.     Follow Up Recommendations  Home health OT;Supervision/Assistance - 24 hour    Equipment Recommendations  None recommended by OT    Recommendations for Other Services       Precautions / Restrictions Precautions Precautions: Fall Restrictions Weight Bearing Restrictions: No      Mobility Bed Mobility Overal bed mobility: Needs Assistance Bed Mobility:  Supine to Sit     Supine to sit: Modified independent (Device/Increase time)        Transfers Overall transfer level: Needs assistance   Transfers: Sit to/from Stand Sit to Stand: Supervision              Balance Overall balance assessment: Mild deficits observed, not formally tested     Standardized Balance Assessment Standardized Balance Assessment : Dynamic Gait Index   Dynamic Gait Index Level Surface: Normal Change in Gait Speed: Normal Gait with Horizontal Head Turns: Mild Impairment Gait with Vertical Head Turns: Mild Impairment Gait and Pivot Turn: Normal Step Over Obstacle: Normal Step Around Obstacles: Mild Impairment Steps: Mild Impairment Total Score: 20     ADL either performed or assessed with clinical judgement   ADL Overall ADL's : Needs assistance/impaired         General ADL Comments: supervision with min cuing for safety with increased time to sequence.     Vision Baseline Vision/History: Wears glasses Wears Glasses: Reading only Patient Visual Report: No change from baseline              Pertinent Vitals/Pain Pain Assessment: No/denies pain     Hand Dominance Right   Extremity/Trunk Assessment Upper Extremity Assessment Upper Extremity Assessment: Overall WFL for tasks assessed   Lower Extremity Assessment Lower Extremity Assessment: Overall WFL for tasks assessed       Communication Communication Communication: No difficulties   Cognition Arousal/Alertness: Awake/alert Behavior During Therapy: WFL for tasks assessed/performed;Impulsive Overall Cognitive Status: Impaired/Different from baseline Area of Impairment: Safety/judgement            Safety/Judgement: Decreased awareness of safety;Decreased awareness of deficits     General Comments:  followed simple commands.  Showed to be oriented.  Slowed responses   General Comments               Home Living Family/patient expects to be discharged to::  Private residence Living Arrangements: Alone Available Help at Discharge: Family;Available PRN/intermittently;Friend(s) Type of Home: House Home Access: Stairs to enter [employer] of Steps: 4 Entrance Stairs-Rails: Right;Left Home Layout: One level;Able to live on main level with bedroom/bathroom     Bathroom Shower/Tub: Chief Strategy Officer: Standard                Prior Functioning/Environment Level of Independence: Independent        Comments: drives infrequently for short distances.  Independent at home        OT Problem List: Decreased strength;Decreased knowledge of use of DME or AE;Decreased coordination;Decreased activity tolerance;Decreased cognition;Impaired UE functional use;Impaired balance (sitting and/or standing);Decreased safety awareness;Pain      OT Treatment/Interventions: Self-care/ADL training;Balance training;Therapeutic exercise;Therapeutic activities;Neuromuscular education;Energy conservation;Cognitive remediation/compensation;Patient/family education;Visual/perceptual remediation/compensation;DME and/or AE instruction    OT Goals(Current goals can be found in the care plan section) Acute Rehab OT Goals Patient Stated Goal: to go home OT Goal Formulation: With patient/family Time For Goal Achievement: 06/21/18 Potential to Achieve Goals: Good ADL Goals Pt Will Perform Lower Body Bathing: with supervision Pt Will Transfer to Toilet: with supervision Pt Will Perform Toileting - Clothing Manipulation and hygiene: with supervision Pt Will Perform Tub/Shower Transfer: with supervision  OT Frequency: Min 2X/week   Barriers to D/C: Other (comment)  none known at this time          AM-PAC OT "6 Clicks" Daily Activity     Outcome Measure Help from another person eating meals?: None Help from another person taking care of personal grooming?: None Help from another person toileting, which includes using toliet, bedpan, or  urinal?: None Help from another person bathing (including washing, rinsing, drying)?: A Little Help from another person to put on and taking off regular upper body clothing?: None Help from another person to put on and taking off regular lower body clothing?: A Little 6 Click Score: 22   End of Session Nurse Communication: Mobility status;Precautions  Activity Tolerance: Patient tolerated treatment well Patient left: in bed;with call bell/phone within reach;with bed alarm set;with family/visitor present  OT Visit Diagnosis: Muscle weakness (generalized) (M62.81);Unsteadiness on feet (R26.81) Hemiplegia - caused by: Cerebral infarction                Time: 8938-1017 OT Time Calculation (min): 21 min Charges:  OT General Charges $OT Visit: 1 Visit OT Evaluation $OT Eval Low Complexity: 1 Low  Libbie Bartley P, MS, OTR/L 06/07/2018, 2:10 PM

## 2018-06-07 NOTE — Progress Notes (Addendum)
STROKE TEAM PROGRESS NOTE   SUBJECTIVE (INTERVAL HISTORY) His daughter is at the bedside.  Overall his condition is rapidly improving. He still has right facial droop but otherwise intact. PT no recommendations. He is on Xarelto and stated compliant with medication   OBJECTIVE Temp:  [97.6 F (36.4 C)-98.7 F (37.1 C)] 98.2 F (36.8 C) (02/25 1148) Pulse Rate:  [54-82] 62 (02/25 1148) Cardiac Rhythm: Normal sinus rhythm;Bundle branch block (02/25 0700) Resp:  [14-23] 14 (02/25 1148) BP: (138-179)/(62-81) 161/78 (02/25 1148) SpO2:  [94 %-100 %] 96 % (02/25 1148)  Recent Labs  Lab 06/06/18 1032 06/06/18 2127 06/07/18 0608  GLUCAP 269* 178* 190*   Recent Labs  Lab 06/06/18 1030 06/06/18 1108  NA 132*  --   K 4.3  --   CL 99  --   CO2 22  --   GLUCOSE 276*  --   BUN 9  --   CREATININE 0.88 0.70  CALCIUM 9.5  --    Recent Labs  Lab 06/06/18 1030  AST 34  ALT 27  ALKPHOS 67  BILITOT 0.6  PROT 7.1  ALBUMIN 3.8   Recent Labs  Lab 06/06/18 1030  WBC 6.3  NEUTROABS 4.0  HGB 14.9  HCT 44.1  MCV 94.4  PLT 207   No results for input(s): CKTOTAL, CKMB, CKMBINDEX, TROPONINI in the last 168 hours. Recent Labs    06/06/18 1030  LABPROT 12.8  INR 1.0   Recent Labs    06/06/18 1655  COLORURINE YELLOW  LABSPEC 1.008  PHURINE 6.0  GLUCOSEU >=500*  HGBUR NEGATIVE  BILIRUBINUR NEGATIVE  KETONESUR NEGATIVE  PROTEINUR NEGATIVE  NITRITE NEGATIVE  LEUKOCYTESUR NEGATIVE       Component Value Date/Time   CHOL 152 06/07/2018 0443   TRIG 289 (H) 06/07/2018 0443   HDL 38 (L) 06/07/2018 0443   CHOLHDL 4.0 06/07/2018 0443   VLDL 58 (H) 06/07/2018 0443   LDLCALC 56 06/07/2018 0443   Lab Results  Component Value Date   HGBA1C 8.4 (H) 06/07/2018   No results found for: LABOPIA, COCAINSCRNUR, LABBENZ, AMPHETMU, THCU, LABBARB  No results for input(s): ETH in the last 168 hours.  I have personally reviewed the radiological images below and agree with the  radiology interpretations.  Mr Shirlee Latch NB Contrast  Result Date: 06/06/2018 CLINICAL DATA:  Slurred speech and facial droop beginning 0845 hours today. EXAM: MRI HEAD WITHOUT CONTRAST MRA HEAD WITHOUT CONTRAST TECHNIQUE: Multiplanar, multiecho pulse sequences of the brain and surrounding structures were obtained without intravenous contrast. Angiographic images of the head were obtained using MRA technique without contrast. COMPARISON:  Head CT earlier same day FINDINGS: MRI HEAD FINDINGS Brain: Subcentimeter acute infarction in the left posterior frontal subcortical white matter. No cortical infarction. No mass lesion, hemorrhage, hydrocephalus or extra-axial collection. Elsewhere, the brain shows generalized atrophy and chronic small-vessel ischemic changes of the white matter. Vascular: Major vessels at the base of the brain show flow. Skull and upper cervical spine: Negative Sinuses/Orbits: Clear/normal Other: None MRA HEAD FINDINGS Both internal carotid arteries are widely patent into the brain. No siphon stenosis. The anterior and middle cerebral vessels are patent without proximal stenosis, aneurysm or vascular malformation. Both vertebral arteries are widely patent to the basilar. No basilar stenosis. Posterior circulation branch vessels appear normal. IMPRESSION: Subcentimeter acute infarction in the left posterior frontal subcortical white matter. Atrophy and chronic small-vessel ischemic changes elsewhere throughout the brain. Negative MRA for large or medium vessel occlusion or correctable proximal  stenosis. Electronically Signed   By: Paulina Fusi M.D.   On: 06/06/2018 14:07   Mr Brain Wo Contrast  Result Date: 06/06/2018 CLINICAL DATA:  Slurred speech and facial droop beginning 0845 hours today. EXAM: MRI HEAD WITHOUT CONTRAST MRA HEAD WITHOUT CONTRAST TECHNIQUE: Multiplanar, multiecho pulse sequences of the brain and surrounding structures were obtained without intravenous contrast.  Angiographic images of the head were obtained using MRA technique without contrast. COMPARISON:  Head CT earlier same day FINDINGS: MRI HEAD FINDINGS Brain: Subcentimeter acute infarction in the left posterior frontal subcortical white matter. No cortical infarction. No mass lesion, hemorrhage, hydrocephalus or extra-axial collection. Elsewhere, the brain shows generalized atrophy and chronic small-vessel ischemic changes of the white matter. Vascular: Major vessels at the base of the brain show flow. Skull and upper cervical spine: Negative Sinuses/Orbits: Clear/normal Other: None MRA HEAD FINDINGS Both internal carotid arteries are widely patent into the brain. No siphon stenosis. The anterior and middle cerebral vessels are patent without proximal stenosis, aneurysm or vascular malformation. Both vertebral arteries are widely patent to the basilar. No basilar stenosis. Posterior circulation branch vessels appear normal. IMPRESSION: Subcentimeter acute infarction in the left posterior frontal subcortical white matter. Atrophy and chronic small-vessel ischemic changes elsewhere throughout the brain. Negative MRA for large or medium vessel occlusion or correctable proximal stenosis. Electronically Signed   By: Paulina Fusi M.D.   On: 06/06/2018 14:07   Dg Chest Portable 1 View  Result Date: 06/06/2018 CLINICAL DATA:  Slurred speech and right facial droop this morning. EXAM: PORTABLE CHEST 1 VIEW COMPARISON:  Chest CT 08/27/2016 FINDINGS: The heart is borderline enlarged but stable. Stable tortuosity and calcification of the thoracic aorta. The lungs are clear. No worrisome pulmonary lesions. No pleural effusion. The bony thorax is intact. IMPRESSION: No acute cardiopulmonary findings. Electronically Signed   By: Rudie Meyer M.D.   On: 06/06/2018 11:20   Ct Head Code Stroke Wo Contrast  Result Date: 06/06/2018 CLINICAL DATA:  Code stroke. Slurred speech. Possible stroke. Focal neuro deficit of less than 6  hours. EXAM: CT HEAD WITHOUT CONTRAST TECHNIQUE: Contiguous axial images were obtained from the base of the skull through the vertex without intravenous contrast. COMPARISON:  None. FINDINGS: Brain: Moderate atrophy and diffuse periventricular white matter disease is present bilaterally. Basal ganglia are intact. Insular ribbon is normal. A lacunar infarct of the right caudate head appears remote. No acute or focal cortical infarcts are present. The ventricles are of proportionate to the degree of atrophy. No significant extraaxial fluid collection is present. The brainstem and cerebellum are within normal limits. Vascular: Atherosclerotic calcifications are present within the cavernous internal carotid arteries. There is no hyperdense vessel. Skull: Calvarium is intact. No focal lytic or blastic lesions are present. Sinuses/Orbits: The paranasal sinuses and mastoid air cells are clear. The globes and orbits are within normal limits. ASPECTS [facility] Stroke Program Early CT Score) - Ganglionic level infarction (caudate, lentiform nuclei, internal capsule, insula, M1-M3 cortex): 7/7 - Supraganglionic infarction (M4-M6 cortex): 3/3 Total score (0-10 with 10 being normal): 10/10 IMPRESSION: 1. No acute intracranial abnormality. 2. Atrophy and white matter disease appears chronic. 3. Lacunar infarct of the right caudate head appears remote. 4. ASPECTS is 10/10 The above was relayed via text pager to Dr. Wilford Corner on 06/06/2018 at 10:48 . Electronically Signed   By: Marin Roberts M.D.   On: 06/06/2018 10:49   Vas US Carotid (at [facility] Only)  Result Date: 06/06/2018 Carotid Arterial Duplex Study  Indications:       CVA and Speech disturbance. Risk Factors:      Hypertension, Diabetes. Other Factors:     History of PE. Limitations:       Patient's labored breathing. Comparison Study:  No comparison study available. Performing Technologist: Melodie Bouillon  Examination Guidelines: A complete evaluation includes B-mode  imaging, spectral Doppler, color Doppler, and power Doppler as needed of all accessible portions of each vessel. Bilateral testing is considered an integral part of a complete examination. Limited examinations for reoccurring indications may be performed as noted.  Right Carotid Findings: +----------+--------+--------+--------+-------------------------------+--------+           PSV cm/sEDV cm/sStenosisDescribe                       Comments +----------+--------+--------+--------+-------------------------------+--------+ CCA Prox  65      12                                                      +----------+--------+--------+--------+-------------------------------+--------+ CCA Distal59      16              diffuse, heterogenous and                                                 smooth                                  +----------+--------+--------+--------+-------------------------------+--------+ ICA Prox  47      17      1-39%   diffuse, calcific and irregular         +----------+--------+--------+--------+-------------------------------+--------+ ICA Mid   103     29      1-39%   diffuse and calcific                    +----------+--------+--------+--------+-------------------------------+--------+ ICA Distal76      19                                                      +----------+--------+--------+--------+-------------------------------+--------+ ECA       123     10                                                      +----------+--------+--------+--------+-------------------------------+--------+ +----------+--------+-------+--------+-------------------+           PSV cm/sEDV cmsDescribeArm Pressure (mmHG) +----------+--------+-------+--------+-------------------+ [id number]                                         +----------+--------+-------+--------+-------------------+ +---------+--------+--+--------+-+---------+ VertebralPSV  cm/s34EDV cm/s9Antegrade +---------+--------+--+--------+-+---------+  Left Carotid Findings: +----------+--------+--------+--------+---------------------+------------------+           PSV cm/sEDV cm/sStenosisDescribe  Comments           +----------+--------+--------+--------+---------------------+------------------+ CCA Prox  83      16                                                      +----------+--------+--------+--------+---------------------+------------------+ CCA Distal57      16                                   intimal thickening +----------+--------+--------+--------+---------------------+------------------+ ICA Prox  66      20      1-39%   diffuse, calcific and                                                     irregular                               +----------+--------+--------+--------+---------------------+------------------+ ICA Mid   76      18                                                      +----------+--------+--------+--------+---------------------+------------------+ ICA Distal83      17                                                      +----------+--------+--------+--------+---------------------+------------------+ ECA       152     19                                                      +----------+--------+--------+--------+---------------------+------------------+ +----------+--------+--------+--------+-------------------+ SubclavianPSV cm/sEDV cm/sDescribeArm Pressure (mmHG) +----------+--------+--------+--------+-------------------+           117                                         +----------+--------+--------+--------+-------------------+ +---------+--------+--+--------+--+---------+ VertebralPSV cm/s54EDV cm/s17Antegrade +---------+--------+--+--------+--+---------+  Summary: Right Carotid: Velocities in the right ICA are consistent with a 1-39% stenosis. Left Carotid: Velocities  in the left ICA are consistent with a 1-39% stenosis. Vertebrals: Bilateral vertebral arteries demonstrate antegrade flow. *See table(s) above for measurements and observations.     Preliminary     PHYSICAL EXAM  Temp:  [97.6 F (36.4 C)-98.7 F (37.1 C)] 98.2 F (36.8 C) (02/25 1148) Pulse Rate:  [54-82] 62 (02/25 1148) Resp:  [14-23] 14 (02/25 1148) BP: (138-179)/(62-81) 161/78 (02/25 1148) SpO2:  [94 %-100 %] 96 % (02/25 1148)  General - Well nourished, well developed, in no apparent distress.  Ophthalmologic - fundi not visualized due to  noncooperation.  Cardiovascular - Regular rate and rhythm.  Mental Status -  Level of arousal and orientation to time, place, and person were intact. Language including expression, naming, repetition, comprehension was assessed and found intact. Fund of Knowledge was assessed and was intact.  Cranial Nerves II - XII - II - Visual field intact OU. III, IV, VI - Extraocular movements intact. V - Facial sensation intact bilaterally. VII - mild right facial droop. VIII - Hearing & vestibular intact bilaterally. X - Palate elevates symmetrically. XI - Chin turning & shoulder shrug intact bilaterally. XII - Tongue protrusion intact.  Motor Strength - The patient's strength was normal in all extremities and pronator drift was absent.  Bulk was normal and fasciculations were absent.   Motor Tone - Muscle tone was assessed at the neck and appendages and was normal.  Reflexes - The patient's reflexes were symmetrical in all extremities and he had no pathological reflexes.  Sensory - Light touch, temperature/pinprick were assessed and were symmetrical.    Coordination - The patient had normal movements in the hands with no ataxia or dysmetria.  Tremor was absent.  Gait and Station - deferred.   ASSESSMENT/PLAN Roger Ramirez is a 73 y.o. male with history of DM, HTN, HLD, PE and APL on Xarelto admitted for right facial droop and slurry  speech. No tPA given due to on Xarelto.    Stroke:  left CR small infarct likely secondary to small vessel disease given risk factors and stroke location  Resultant right facial droop  MRI  Left CR small infarct  MRA  Unremarkable   Carotid Doppler  Unremarkable   2D Echo  EF 50-55%  LDL 56  HgbA1c 8.4  Xarelto for VTE prophylaxis  Xarelto (rivaroxaban) daily prior to admission, now on Xarelto (rivaroxaban) daily and ASA 81mg  daily. Continue both on discharge.   Patient counseled to be compliant with his antithrombotic medications  Ongoing aggressive stroke risk factor management  Therapy recommendations:  No PT recommendation  Disposition:  Pending, likely home today  Diabetes  HgbA1c 8.4 goal < 7.0  Uncontrolled  Currently on metformine  CBG monitoring  SSI  DM education and close PCP follow up  Hypertension . Unstable . Recommend to resume home BP meds on discharge  Long term BP goal normotensive  BP monitoring at home and close PCP follow up  Hyperlipidemia  Home meds:  lipitor 10   LDL 56, goal < 70  Now on lipitor 10  Continue statin at discharge  APL syndrome  Hx of PE  On Xarelto at home  Continue Xarelto on discharge  B12 deficiency  B12 level 86  Received B12 im x 2  Recommend B12 po daily  Follow up with PCP for B12 monitoring  Other Stroke Risk Factors  Advanced age  Former Cigarette smoker, advised to stop smoking  Obesity, Body mass index is 33.45 kg/m.   Other Active Problems  Elevated TG  Mild hyponatremia  Hospital day # 1  Neurology will sign off. Please call with questions. Pt will follow up with stroke clinic NP at [facility] in about 4 weeks. Thanks for the consult.   Marvel Plan, MD PhD Stroke Neurology 06/07/2018 12:24 PM    To contact Stroke Continuity provider, please refer to [email or web address]. After hours, contact General Neurology

## 2018-06-07 NOTE — Care Management Note (Signed)
Case Management Note  Patient Details  Name: Roger Ramirez MRN: [id number] Date of Birth: [birth year]  Subjective/Objective:     Pt admitted with a stroke. He is from home alone. Pts daughter and his ex wife are going to provide 24 hour supervision at d/c.    DME at home: none Pt has not had any issues with meds prior to hospital.  Pt was driving prior to hospital.            Action/Plan: Daughter and ex wife to provide transportation.  Daughter interested in finding a new PCP. CM provided list of [facility] affiliated family practice MDs per her request. CM provided choice of HH and she selected [facility]. CM called Tiffany with [facility] and she accepted the referral.  Pt has transportation home.   Expected Discharge Date:  06/07/18               Expected Discharge Plan:  Home w Home Health Services  In-House Referral:     Discharge planning Services  CM Consult  Post Acute Care Choice:  Home Health Choice offered to:  Adult Children  DME Arranged:    DME Agency:     HH Arranged:  RN, OT, Speech Therapy, Social Work [employer] Agency:  [employer] (now [facility])  Status of Service:  Completed, signed off  If discussed at [employer], dates discussed:    Additional Comments:  Kermit Balo, RN 06/07/2018, 3:57 PM

## 2018-06-07 NOTE — Progress Notes (Signed)
Patient discharged.  IV removed with catheter intact.  Left with daughter Thurmon Fair.  Discharge education given to patient and daughter.  Prescription also given to patient and daughter.

## 2018-06-07 NOTE — Evaluation (Signed)
Speech Language Pathology Evaluation Patient Details Name: Roger Ramirez MRN: [id number] DOB: 02-15-1946 Today's Date: 06/07/2018 Time: 5009-3818 SLP Time Calculation (min) (ACUTE ONLY): 27 min  Problem List:  Patient Active Problem List   Diagnosis Date Noted  . Cerebral infarction (HCC)   . CVA (cerebral vascular accident) (HCC) 06/06/2018  . Antiphospholipid syndrome (HCC) 06/06/2018  . Diabetes mellitus type 2, noninsulin dependent (HCC) 06/06/2018  . Hypercholesterolemia with hypertriglyceridemia 01/23/2016  . Impaired fasting glucose 01/23/2016  . B12 deficiency 01/23/2016  . Hypothyroid 01/23/2016  . Cognitive dysfunction 01/23/2016  . Pleuritic chest pain   . Leg DVT (deep venous thromboembolism), acute (HCC)   . Near syncope   . Elevated BP   . History of pulmonary embolus (PE)   . Pulmonary embolism (HCC) 12/24/2014   Past Medical History:  Past Medical History:  Diagnosis Date  . Diabetes mellitus without complication (HCC)   . Hypertension   . Pulmonary embolism [facility])    Past Surgical History: History reviewed. No pertinent surgical history. HPI:  Pt is a 73 y.o. male was admitted secondary to slurred speech and a slight facial droop. Upon admission, pt's daughter reported to neurology that over the past couple of days the pt has been acting somewhat strangely-as an example she said that he was not able to figure out that the heat in the house was broken and did not call for help. Additionally, the patient himself denied having slurred speech and despite bing shown his pictures on his cell phone camera said that his face looks symmetric. Per neurology he does exhibit some cognitive impairment but no formal diagnosis of dementia. The MRI revealed subcentimeter acute infarction in the left posterior frontal subcortical white matter.   Assessment / Plan / Recommendation Clinical Impression  Pt seen for speech cognitive assessment with daughter and ex-wife present who  reports a gradual decline in cognitive functioning for 2 years. He lives alone during the week and girlfriend comes to town on weekends and is responsible for pt's financial responsibilities. Pt demonstrates cogntive challenges with basic tasks (showing/writing correct time on standard clock, naming common animals from pictures, simple divergent naming tasks). He appears to diminish difficulty he is having with humor likely due to a general inability to appreciate these impairments and decreased awareness. He needed cues to verbally problem solve solutions to hypothetical situations (able to state "call fire department' with min prompting and generated "911" without cues however verbal problem solving skills greater than functional. Discussed with pt and daughter need for 24 hour supervision, use of a pill box with full supervision, writing information in central place, continue to use calendars and assist with cooking. Recommend HH ST.          SLP Assessment  SLP Recommendation/Assessment: All further Speech Lanaguage Pathology  needs can be addressed in the next venue of care(discharging home today) SLP Visit Diagnosis: Cognitive communication deficit (R41.841)    Follow Up Recommendations  Home health SLP    Frequency and Duration           SLP Evaluation Cognition  Overall Cognitive Status: Impaired/Different from baseline Arousal/Alertness: Awake/alert Orientation Level: Oriented to place;Oriented to person;Oriented to time Attention: Sustained Sustained Attention: Appears intact Memory: Impaired Memory Impairment: Decreased recall of new information Awareness: Impaired Awareness Impairment: Intellectual impairment;Emergent impairment;Anticipatory impairment Problem Solving: Impaired Problem Solving Impairment: Functional basic Executive Function: Sequencing;Decision Making;Reasoning Reasoning: Impaired Sequencing: Impaired Decision Making: Impaired Safety/Judgment: Impaired        Comprehension  Auditory  Comprehension Overall Auditory Comprehension: Appears within functional limits for tasks assessed Visual Recognition/Discrimination Discrimination: Not tested Reading Comprehension Reading Status: Not tested    Expression Expression Primary Mode of Expression: Verbal Verbal Expression Overall Verbal Expression: Appears within functional limits for tasks assessed Initiation: No impairment Level of Generative/Spontaneous Verbalization: Sentence Repetition: (NT) Naming: Impairment Divergent: 0-24% accurate Pragmatics: No impairment Written Expression Dominant Hand: Right Written Expression: Not tested   Oral / Motor  Oral Motor/Sensory Function Overall Oral Motor/Sensory Function: Within functional limits Motor Speech Overall Motor Speech: Appears within functional limits for tasks assessed Respiration: Within functional limits Phonation: Normal Resonance: Within functional limits Articulation: Within functional limitis Intelligibility: Intelligible Motor Planning: Witnin functional limits   GO                    Royce Macadamia 06/07/2018, 3:31 PM  Breck Coons Azalyn Sliwa M.Ed Nurse, children's [phone] Office [phone]

## 2018-06-07 NOTE — Evaluation (Addendum)
Physical Therapy Evaluation Patient Details Name: Roger Ramirez MRN: [id number] DOB: May 15, 1945 Today's Date: 06/07/2018   History of Present Illness  Roger Ramirez is a 73 y.o. 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   MRI showed Subcentimeter acute infarction in the left posterior frontal  subcortical white matter.    Clinical Impression  Pt is close to baseline physical functioning, but looks to daughter and ?family for corroboration of answers to questions and does not seem to take the stroke seriously when potential problems were discussed with pt/dtr.  Pt answered orientation questions and simple cognitive question, but I did not delve into more complex cognitive tasks.  Pt should be safe at home with increased supervision/assist of family/friends up front until all concerned determine his level of safety.  I have discussed the patient's current level of function related to pt's deficits  with the patient and dtr/family.  They acknowledge understanding of this and feel they can provide the level of care the patient will need at home.   I am signing off in anticipation of pt's d/c today.      Follow Up Recommendations HHPT home safety and treat    Equipment Recommendations  None recommended by PT    Recommendations for Other Services       Precautions / Restrictions Precautions Precautions: Fall(minimal risk)      Mobility  Bed Mobility Overal bed mobility: Needs Assistance Bed Mobility: Supine to Sit     Supine to sit: Modified independent (Device/Increase time)        Transfers Overall transfer level: Needs assistance   Transfers: Sit to/from Stand Sit to Stand: Modified independent (Device/Increase time)             Ambulation/Gait Ambulation/Gait assistance: Supervision;Modified independent (Device/Increase time) Gait Distance (Feet): 300 Feet Assistive device: None Gait Pattern/deviations: Step-through pattern   Gait velocity interpretation: 1.31 - 2.62 ft/sec, indicative of limited community ambulator General Gait Details: generally steady with episodes of deviation with scanning and abrupt directional changes.  No overt LOB  Stairs Stairs: Yes Stairs assistance: Supervision Stair Management: One rail Right;Alternating pattern;Forwards Number of Stairs: 3 General stair comments: safe with the rail  Wheelchair Mobility    Modified Rankin (Stroke Patients Only) Modified Rankin (Stroke Patients Only) Pre-Morbid Rankin Score: No symptoms Modified Rankin: Slight disability     Balance Overall balance assessment: Modified Independent                               Standardized Balance Assessment Standardized Balance Assessment : Dynamic Gait Index   Dynamic Gait Index Level Surface: Normal Change in Gait Speed: Normal Gait with Horizontal Head Turns: Mild Impairment Gait with Vertical Head Turns: Mild Impairment Gait and Pivot Turn: Normal Step Over Obstacle: Normal Step Around Obstacles: Mild Impairment Steps: Mild Impairment Total Score: 20       Pertinent Vitals/Pain Pain Assessment: No/denies pain    Home Living Family/patient expects to be discharged to:: Private residence Living Arrangements: Alone Available Help at Discharge: Family;Available PRN/intermittently;Friend(s) Type of Home: House Home Access: Stairs to enter Entrance Stairs-Rails: Right;Left Entrance Stairs-Number of Steps: 4 Home Layout: One level;Able to live on main level with bedroom/bathroom        Prior Function Level  of Independence: Independent         Comments: drives infrequently for short distances.  Independent at home     Hand Dominance        Extremity/Trunk  Assessment   Upper Extremity Assessment Upper Extremity Assessment: Defer to OT evaluation    Lower Extremity Assessment Lower Extremity Assessment: Overall WFL for tasks assessed(mild proximal weakness bil)       Communication   Communication: No difficulties  Cognition Arousal/Alertness: Awake/alert Behavior During Therapy: WFL for tasks assessed/performed Overall Cognitive Status: History of cognitive impairments - at baseline                                 General Comments: followed simple commands.  Showed to be oriented.  Slowed responses      General Comments      Exercises     Assessment/Plan    PT Assessment Patent does not need any further PT services  PT Problem List         PT Treatment Interventions      PT Goals (Current goals can be found in the Care Plan section)  Acute Rehab PT Goals PT Goal Formulation: All assessment and education complete, DC therapy    Frequency     Barriers to discharge        Co-evaluation               AM-PAC PT "6 Clicks" Mobility  Outcome Measure Help needed turning from your back to your side while in a flat bed without using bedrails?: None Help needed moving from lying on your back to sitting on the side of a flat bed without using bedrails?: None Help needed moving to and from a bed to a chair (including a wheelchair)?: None Help needed standing up from a chair using your arms (e.g., wheelchair or bedside chair)?: None Help needed to walk in hospital room?: None Help needed climbing 3-5 steps with a railing? : None 6 Click Score: 24    End of Session   Activity Tolerance: Patient tolerated treatment well Patient left: in bed;with call bell/phone within reach;with family/visitor present Nurse Communication: Mobility status PT Visit Diagnosis: Unsteadiness on feet (R26.81)    Time: 1020-1055 PT Time Calculation (min) (ACUTE ONLY): 35 min   Charges:   PT Evaluation $PT Eval Low  Complexity: 1 Low PT Treatments $Gait Training: 8-22 mins        06/07/2018  [place] Bing, PT Acute Rehabilitation Services [phone]  (pager) [phone]  (office)  Eliseo Gum Sianni Cloninger 06/07/2018, 11:51 AM

## 2018-06-07 NOTE — Plan of Care (Signed)
  RD consulted for nutrition education regarding diabetes.   Lab Results  Component Value Date   HGBA1C 8.4 (H) 06/07/2018    Daughter at bedside. RD provided "Carbohydrate Counting for People with Diabetes" handout from the [employer]. Discussed different food groups and their effects on blood sugar, emphasizing carbohydrate-containing foods. Provided list of carbohydrates and recommended serving sizes of common foods.  Discussed importance of controlled and consistent carbohydrate intake throughout the day. Provided examples of ways to balance meals/snacks and encouraged intake of high-fiber, whole grain complex carbohydrates. Discussed diabetic friendly drink options. Teach back method used.  Expect good compliance.  Body mass index is 33.45 kg/m. Pt meets criteria for class I obesity based on current BMI.  Current diet order is heart healthy/carbohydrate modified. Pt reports having a good appetite currently and PTA. Labs and medications reviewed. Daughter requests outpatient referral for continued diabetes education. RD to order referral. No further nutrition interventions warranted at this time. RD contact information provided. If additional nutrition issues arise, please re-consult RD.  Roslyn Smiling, MS, RD, LDN Pager # [phone] After hours/ weekend pager # [phone]

## 2018-06-07 NOTE — Discharge Summary (Addendum)
Discharge Summary  Roger Ramirez UXL:[id number] DOB: May 09, 1945  PCP: Roger Carol, MD  Admit date: 06/06/2018 Discharge date: 06/07/2018  Time spent: 24mns, more than 50% time spent on coordination of care. Case discussed with neurology, case manager, plan of care explained to the patient and his daughter.   Recommendations for Outpatient Follow-up:  1. F/u with PCP within a week  for hospital discharge follow up, repeat cbc/bmp at follow up. pcp to monitor blood pressure and blood glucose control, pcp to monitor b12 level 2. F/u with neurology 3. Home health arrangement (RN,OT,speech, sEducation officer, museum  Discharge Diagnoses:  Active Hospital Problems   Diagnosis Date Noted  . CVA (cerebral vascular [place]) ([facility] 06/06/2018  . Cerebral infarction ([facility]   . Antiphospholipid syndrome ([facility] 06/06/2018  . Diabetes mellitus type 2, noninsulin dependent ([facility] 06/06/2018  . Hypercholesterolemia with hypertriglyceridemia 01/23/2016  . B12 deficiency 01/23/2016  . Hypothyroid 01/23/2016  . Cognitive dysfunction 01/23/2016  . History of pulmonary embolus (PE)     Resolved Hospital Problems  No resolved problems to display.    Discharge Condition: stable  Diet recommendation: heart healthy/carb modified  Filed Weights   06/06/18 1057  Weight: 99.8 kg    History of present illness: (per admitting MD Dr Roger Ramirez PCP: Roger Carol MD Consultants: Neurology Patient coming from: Home- lives alone  Chief Complaint: Altered mental status  HPI: Roger Niedis a 73y.o. 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  His last known normal was around 845 this morning.  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  The daughter also reports that over the past couple of days he has not been acting like himself; his girlfriend stated that she went by his house a couple of days ago and his thermostat was not working and the temperature in the house was 51 degrees but patient did not notice and did not state that he felt cold.  Patient is a poor historian at this time due to dysarthria and altered mental status but he is able to answer some questions.  He is unaware of having any facial droop or dysarthria.  He feels that his speech is normal currently and he is not aware of having slurred speech earlier.  He has not had any recent fevers or chills or infectious symptoms.  He has had no chest pain or shortness of breath.  No palpitations.  He has had no abdominal pain, nausea, vomiting, diarrhea or constipation.  No dysuria.  He is unable to tell me what his recent blood sugars have been.  He states that he has been eating and drinking as normal.  According to the daughter the last time he was admitted he had very low vitamin B12 levels and required shots which improved his mentation.  He has had a recent history of some cognitive impairment but is never been formally assessed or diagnosed with dementia.  ED Course: Code stroke was called upon arrival.  NIH stroke scale was 3.  He was evaluated by neurology.  He was oriented to self and place but did get his age wrong by 2 years.  He did have a very subtle right sided  facial droop.  His speech was slurred.  He had upper and lower extremity tremor.  EKG revealed sinus rhythm with no signs of ischemia.  As his last dose of Xarelto was less than 24 hours prior he was not given TPA.  Head CT was reassuring.  MRI showed subcentimeter acute infarct in the left posterior frontal subcortical white matter.  MRA was negative for large or medium vessel occlusion or correctable proximal stenosis.  Internal  medicine was called for admission.   Hospital Course:  Principal Problem:   CVA (cerebral vascular accident) ([place]) Active Problems:   History of pulmonary embolus (PE)   Hypercholesterolemia with hypertriglyceridemia   B12 deficiency   Hypothyroid   Cognitive dysfunction   Antiphospholipid syndrome (HCC)   Diabetes mellitus type 2, noninsulin dependent (HCC)   Cerebral infarction ([place])   Acute CVA while on xarelto at home -MRI confirmed acute CVA: Subcentimeter acute infarction in the left posterior frontal subcortical white matter. Atrophy and chronic small-vessel ischemic changes elsewhere throughout the brain. Negative MRA for large or medium vessel occlusion or correctable proximal stenosis. -Carotid Doppler  Unremarkable  -2D Echo  EF 50-55% -LDL 56 -HgbA1c 8.4 --patient presented with confusion, slurred speech , right facial droop with known time of onset  -code stroke initiated in the ED, he is not a candidate for TPA due to low NIH stroke scale on presentation and last dose of xarelto within 48hr -per neurology  "left CR small infarct likely secondary to small vessel disease given risk factors and stroke location" ,recommend continue xarelto, add on asa '81mg'$  daily, continue lipitor '10mg'$  daily, blood glucose control, monitor blood pressure -patient's symptoms recovered quicker than expected. at time of discharge, although still has mild right side facial droop, speech has improved, confusion has resolved, he did well with physical therapy, he wants to go home. -he is to follow up with neurology  noninsulin dependent dm2 -Uncontrolled with hyperglycemia while in the hospital, he received ssi in the hospital -a1c 8.4%, goal less than 7% -he received diabetes and diet education in the hospital, outpatient education referral made  -glucometer and testing supplies prescribed -home health RN arranged for disease management (patient has underline memory deficit)  -home meds  metformin held in the hospital, resumed at discharge, encourage diet and weight management, close follow up with pcp for diabetes control.   HTN:  Home bp meds held in the hospital to allow permissive hypertension -home meds avapro resumed at discharge -he is advised to check blood pressure at home and follow up with pcp for blood pressure monitoring   H/o antiphospholipid syndrome , h/o PE/DVT Continue xarelto   B12 deficiency -B12 level 86 -Received B12 1033mg im x 2 -discharged on B12 10063m po daily -Follow up with PCP for B12 monitoring  Hypothyroidism: tsh 2.7, continue synthroid  Procedures:  none  Consultations:  neurology  Discharge Exam: BP (!) 161/78 (BP Location: Left Arm)   Pulse 62   Temp 98.2 F (36.8 C) (Oral)   Resp 14   Ht '5\' 8"'$  (1.727 m)   Wt 99.8 kg   SpO2 96%   BMI 33.45 kg/m   General: NAD, aaox3, mild right facial droop Cardiovascular: RRR Respiratory: CTABL  Discharge Instructions You were cared for by a hospitalist during your hospital stay. If you have any questions about your discharge medications or the care you received while you were in the hospital after you are discharged, you can call the unit and asked to speak  with the hospitalist on call if the hospitalist that took care of you is not available. Once you are discharged, your primary care physician will handle any further medical issues. Please note that NO REFILLS for any discharge medications will be authorized once you are discharged, as it is imperative that you return to your primary care physician (or establish a relationship with a primary care physician if you do not have one) for your aftercare needs so that they can reassess your need for medications and monitor your lab values.  Discharge Instructions    Amb Referral to Nutrition and Diabetic E   Complete by:  As directed    Ambulatory referral to Neurology   Complete by:  As directed    Follow up with stroke clinic  NP (Jessica Vanschaick or Cecille Rubin, if both not available, consider Zachery Dauer, or [facility]) at [facility] in about 4 weeks. Thanks.  Daughter prefer to be called, her contact number is [phone].   Ambulatory referral to Nutrition and Diabetic Education   Complete by:  As directed    Daughter prefers to be called , her number is [phone]   Diet - low sodium heart healthy   Complete by:  As directed    Carb modified diet   Discharge instructions   Complete by:  As directed    Please check your blood pressure once a day at bedtime, bring in record for your doctor to review Please check your blood glucose three times a day, bring in record for your doctor to review   Increase activity slowly   Complete by:  As directed      Allergies as of 06/07/2018   No Known Allergies     Medication List    TAKE these medications   aspirin EC 81 MG tablet Take 1 tablet (81 mg total) by mouth daily for 30 days.   atorvastatin 10 MG tablet Commonly known as:  LIPITOR Take 10 mg by mouth daily.   blood glucose meter kit and supplies Kit Dispense based on patient and insurance preference. Use up to four times daily as directed. (FOR ICD-9 250.00, 250.01).   cyanocobalamin 1000 MCG tablet Take 1 tablet (1,000 mcg total) by mouth daily. Start taking on:  June 08, 2018   ibuprofen 800 MG tablet Commonly known as:  ADVIL,MOTRIN Take 800 mg by mouth every 8 (eight) hours as needed for moderate pain.   irbesartan 150 MG tablet Commonly known as:  AVAPRO Take 75 mg by mouth daily.   levothyroxine 75 MCG tablet Commonly known as:  SYNTHROID, LEVOTHROID Take 75 mcg by mouth every morning.   metFORMIN 1000 MG tablet Commonly known as:  GLUCOPHAGE Take 1,000 mg by mouth 2 (two) times daily.   rivaroxaban 20 MG Tabs tablet Commonly known as:  XARELTO Take 1 tablet (20 mg total) by mouth daily.      No Known Allergies Follow-up Information    [facility].  Schedule an appointment as soon as possible for a visit in 4 week(s).   Specialty:  Neurology Contact information: [address] [place] [phone]       f/u with pcp in one week Follow up in 1 week(s).   Why:  hospital discharge follow up, repeat cbc/bmp at follow up. pcp to monitor blood pressure control , diabetes control and b12 level.       Home, [facility] Follow up.   Specialty:  Home Health Services Why:  They will call for first appointment Contact information: [place] [address] [phone]            The results of significant diagnostics from this hospitalization (including imaging, microbiology, ancillary and laboratory) are listed below for reference.    Significant Diagnostic Studies: Mr Roger Ramirez WU Contrast  Result Date: 06/06/2018 CLINICAL DATA:  Slurred speech and facial droop beginning 0845 hours today. EXAM: MRI HEAD WITHOUT CONTRAST MRA HEAD WITHOUT CONTRAST TECHNIQUE: Multiplanar, multiecho pulse sequences of the brain and surrounding structures were obtained without intravenous contrast. Angiographic images of the head were obtained using MRA technique without contrast. COMPARISON:  Head CT earlier same day FINDINGS: MRI HEAD FINDINGS Brain: Subcentimeter acute infarction in the left posterior frontal subcortical white matter. No cortical infarction. No mass lesion, hemorrhage, hydrocephalus or extra-axial collection. Elsewhere, the brain shows generalized atrophy and chronic small-vessel ischemic changes of the white matter. Vascular: Major vessels at the base of the brain show flow. Skull and upper cervical spine: Negative Sinuses/Orbits: Clear/normal Other: None MRA HEAD FINDINGS Both internal carotid arteries are widely patent into the brain. No siphon stenosis. The anterior and middle cerebral vessels are patent without proximal stenosis, aneurysm or vascular malformation. Both vertebral  arteries are widely patent to the basilar. No basilar stenosis. Posterior circulation branch vessels appear normal. IMPRESSION: Subcentimeter acute infarction in the left posterior frontal subcortical white matter. Atrophy and chronic small-vessel ischemic changes elsewhere throughout the brain. Negative MRA for large or medium vessel occlusion or correctable proximal stenosis. Electronically Signed   By: Nelson Chimes M.D.   On: 06/06/2018 14:07   Mr Brain Wo Contrast  Result Date: 06/06/2018 CLINICAL DATA:  Slurred speech and facial droop beginning 0845 hours today. EXAM: MRI HEAD WITHOUT CONTRAST MRA HEAD WITHOUT CONTRAST TECHNIQUE: Multiplanar, multiecho pulse sequences of the brain and surrounding structures were obtained without intravenous contrast. Angiographic images of the head were obtained using MRA technique without contrast. COMPARISON:  Head CT earlier same day FINDINGS: MRI HEAD FINDINGS Brain: Subcentimeter acute infarction in the left posterior frontal subcortical white matter. No cortical infarction. No mass lesion, hemorrhage, hydrocephalus or extra-axial collection. Elsewhere, the brain shows generalized atrophy and chronic small-vessel ischemic changes of the white matter. Vascular: Major vessels at the base of the brain show flow. Skull and upper cervical spine: Negative Sinuses/Orbits: Clear/normal Other: None MRA HEAD FINDINGS Both internal carotid arteries are widely patent into the brain. No siphon stenosis. The anterior and middle cerebral vessels are patent without proximal stenosis, aneurysm or vascular malformation. Both vertebral arteries are widely patent to the basilar. No basilar stenosis. Posterior circulation branch vessels appear normal. IMPRESSION: Subcentimeter acute infarction in the left posterior frontal subcortical white matter. Atrophy and chronic small-vessel ischemic changes elsewhere throughout the brain. Negative MRA for large or medium vessel occlusion or  correctable proximal stenosis. Electronically Signed   By: Nelson Chimes M.D.   On: 06/06/2018 14:07   Dg Chest Portable 1 View  Result Date: 06/06/2018 CLINICAL DATA:  Slurred speech and right facial droop this morning. EXAM: PORTABLE CHEST 1 VIEW COMPARISON:  Chest CT 08/27/2016 FINDINGS: The heart is borderline enlarged but stable. Stable tortuosity and calcification of the thoracic aorta. The lungs are clear. No worrisome pulmonary lesions. No pleural effusion. The bony thorax is intact. IMPRESSION: No acute cardiopulmonary findings. Electronically Signed   By: Marijo Sanes M.D.   On: 06/06/2018 11:20   Ct Head Code Stroke Wo Contrast  Result  Date: 06/06/2018 CLINICAL DATA:  Code stroke. Slurred speech. Possible stroke. Focal neuro deficit of less than 6 hours. EXAM: CT HEAD WITHOUT CONTRAST TECHNIQUE: Contiguous axial images were obtained from the base of the skull through the vertex without intravenous contrast. COMPARISON:  None. FINDINGS: Brain: Moderate atrophy and diffuse periventricular white matter disease is present bilaterally. Basal ganglia are intact. Insular ribbon is normal. A lacunar infarct of the right caudate head appears remote. No acute or focal cortical infarcts are present. The ventricles are of proportionate to the degree of atrophy. No significant extraaxial fluid collection is present. The brainstem and cerebellum are within normal limits. Vascular: Atherosclerotic calcifications are present within the cavernous internal carotid arteries. There is no hyperdense vessel. Skull: Calvarium is intact. No focal lytic or blastic lesions are present. Sinuses/Orbits: The paranasal sinuses and mastoid air cells are clear. The globes and orbits are within normal limits. ASPECTS [facility] Stroke Program Early CT Score) - Ganglionic level infarction (caudate, lentiform nuclei, internal capsule, insula, M1-M3 cortex): 7/7 - Supraganglionic infarction (M4-M6 cortex): 3/3 Total score (0-10 with  10 being normal): 10/10 IMPRESSION: 1. No acute intracranial abnormality. 2. Atrophy and white matter disease appears chronic. 3. Lacunar infarct of the right caudate head appears remote. 4. ASPECTS is 10/10 The above was relayed via text pager to Dr. Rory Percy on 06/06/2018 at 10:48 . Electronically Signed   By: San Morelle M.D.   On: 06/06/2018 10:49   Vas US Carotid (at [place] Only)  Result Date: 06/07/2018 Carotid Arterial Duplex Study Indications:       CVA and Speech disturbance. Risk Factors:      Hypertension, Diabetes. Other Factors:     History of PE. Limitations:       Patient's labored breathing. Comparison Study:  No comparison study available. Performing Technologist: Rudell Cobb  Examination Guidelines: A complete evaluation includes B-mode imaging, spectral Doppler, color Doppler, and power Doppler as needed of all accessible portions of each vessel. Bilateral testing is considered an integral part of a complete examination. Limited examinations for reoccurring indications may be performed as noted.  Right Carotid Findings: +----------+--------+--------+--------+-------------------------------+--------+           PSV cm/sEDV cm/sStenosisDescribe                       Comments +----------+--------+--------+--------+-------------------------------+--------+ CCA Prox  65      12                                                      +----------+--------+--------+--------+-------------------------------+--------+ CCA Distal59      16              diffuse, heterogenous and                                                 smooth                                  +----------+--------+--------+--------+-------------------------------+--------+ ICA Prox  47      17      1-39%   diffuse, calcific and irregular         +----------+--------+--------+--------+-------------------------------+--------+  ICA Mid   103     29      1-39%   diffuse and calcific                     +----------+--------+--------+--------+-------------------------------+--------+ ICA Distal76      19                                                      +----------+--------+--------+--------+-------------------------------+--------+ ECA       123     10                                                      +----------+--------+--------+--------+-------------------------------+--------+ +----------+--------+-------+--------+-------------------+           PSV cm/sEDV cmsDescribeArm Pressure (mmHG) +----------+--------+-------+--------+-------------------+ [id number]                                         +----------+--------+-------+--------+-------------------+ +---------+--------+--+--------+-+---------+ VertebralPSV cm/s34EDV cm/s9Antegrade +---------+--------+--+--------+-+---------+  Left Carotid Findings: +----------+--------+--------+--------+---------------------+------------------+           PSV cm/sEDV cm/sStenosisDescribe             Comments           +----------+--------+--------+--------+---------------------+------------------+ CCA Prox  83      16                                                      +----------+--------+--------+--------+---------------------+------------------+ CCA Distal57      16                                   intimal thickening +----------+--------+--------+--------+---------------------+------------------+ ICA Prox  66      20      1-39%   diffuse, calcific and                                                     irregular                               +----------+--------+--------+--------+---------------------+------------------+ ICA Mid   76      18                                                      +----------+--------+--------+--------+---------------------+------------------+ ICA Distal83      17                                                       +----------+--------+--------+--------+---------------------+------------------+  ECA       152     19                                                      +----------+--------+--------+--------+---------------------+------------------+ +----------+--------+--------+--------+-------------------+ SubclavianPSV cm/sEDV cm/sDescribeArm Pressure (mmHG) +----------+--------+--------+--------+-------------------+           117                                         +----------+--------+--------+--------+-------------------+ +---------+--------+--+--------+--+---------+ VertebralPSV cm/s54EDV cm/s17Antegrade +---------+--------+--+--------+--+---------+  Summary: Right Carotid: Velocities in the right ICA are consistent with a 1-39% stenosis. Left Carotid: Velocities in the left ICA are consistent with a 1-39% stenosis. Vertebrals: Bilateral vertebral arteries demonstrate antegrade flow. *See table(s) above for measurements and observations.  Electronically signed by Antony Contras MD on 06/07/2018 at 2:58:44 PM.    Final     Microbiology: No results found for this or any previous visit (from the past 240 hour(s)).   Labs: Basic Metabolic Panel: Recent Labs  Lab 06/06/18 1030 06/06/18 1108  NA 132*  --   K 4.3  --   CL 99  --   CO2 22  --   GLUCOSE 276*  --   BUN 9  --   CREATININE 0.88 0.70  CALCIUM 9.5  --    Liver Function Tests: Recent Labs  Lab 06/06/18 1030  AST 34  ALT 27  ALKPHOS 67  BILITOT 0.6  PROT 7.1  ALBUMIN 3.8   No results for input(s): LIPASE, AMYLASE in the last 168 hours. No results for input(s): AMMONIA in the last 168 hours. CBC: Recent Labs  Lab 06/06/18 1030  WBC 6.3  NEUTROABS 4.0  HGB 14.9  HCT 44.1  MCV 94.4  PLT 207   Cardiac Enzymes: No results for input(s): CKTOTAL, CKMB, CKMBINDEX, TROPONINI in the last 168 hours. BNP: BNP (last 3 results) No results for input(s): BNP in the last 8760 hours.  ProBNP (last 3 results) No  results for input(s): PROBNP in the last 8760 hours.  CBG: Recent Labs  Lab 06/06/18 1032 06/06/18 2127 06/07/18 0608 06/07/18 1257  GLUCAP 269* 178* 190* 190*       Signed:  Florencia Reasons MD, PhD  [facility] 06/07/2018, 4:47 PM

## 2018-06-10 ENCOUNTER — Other Ambulatory Visit: Payer: Self-pay | Admitting: *Deleted

## 2018-06-10 NOTE — Patient Outreach (Signed)
[facility]) Care Management  06/10/2018  Brailyn Topel Apr 12, 1946 [id number]   EMMI-stroke d/c [facility] 06/07/2018  RED ON EMMI ALERT Day # 1 Date: 06/09/2018 Thursday 1009  Red Alert Reason: Scheduled a follow-up appointment?I Don't Know  Insurance:  [employer] medicare   [facility] admissions x 1 ED visits x 1 in the last 6 months    Outreach attempt # 1 was successful to Mr Zajicek home number  Patient is able to verify HIPAA [facility] Care Management RN reviewed and addressed red alert with patient  EMMI  Mr Moeller reports "I did not know how to answer the questions. My friend is not here but could tell you a better answer than I could."   CM asked Mr Congrove if he knew if he had a follow up appointment with Dr Nehemiah Settle, [facility], nutrition and DM education  or [facility] but he states he did not know He was given [facility] RN CM's contact number to give to his friend to call CM about assisting with Medical appointments prn He voiced understanding He was informed of CM's availability Epic does not indicate that any appointments have been made    Consent: [facility] RN CM reviewed [facility] services with patient. Patient gave verbal consent for services.   Advised patient that there will be further automated EMMI- post discharge calls to assess how the patient is doing following the recent hospitalization Advised the patient that another call may be received from a nurse if any of their responses were abnormal. Patient voiced understanding and was appreciative of f/u call.  Plan: [facility] CM sent an unsuccessful outreach letter and scheduled this patient for another call attempt within 4 business days if no return call from his friend by that time   Cala Bradford L. Noelle Penner, RN, BSN, CCM [facility] Telephonic Care Management Care Coordinator Office number [phone] Mobile number [phone]  Main [facility] number [phone] Fax number [phone]

## 2018-06-13 ENCOUNTER — Other Ambulatory Visit: Payer: Self-pay | Admitting: *Deleted

## 2018-06-13 NOTE — Patient Outreach (Signed)
[facility]) Care Management  06/13/2018  Waddie Greiff Jul 04, 1945 [id number]   EMMI-stroke d/c [facility] 06/07/2018  RED ON EMMI ALERT Day # 1 Date: 06/09/2018 Thursday 1009  Red Alert Reason: Scheduled a follow-up appointment?I Don't Know  And   RED ON EMMI ALERT  Day #3  Date Saturday 06/11/2018 1000 Red Alert Reason:  Questions/problems with meds?Yes  Listened to these previous topics: Follow-up Insurance:  united health care medicare   [facility] admissions x 1 ED visits x 1 in the last 6 months    Outreach attempt # 2  was successful to Mr Mcdonnell home number  Patient is able to verify HIPAA [facility] Care Management RN reviewed and addressed red alertwith patient EMMI  He again reports he is having difficulty in answering questions and gives [facility] RN CM permission to speak with his daughter, Ida Rogue attempt #2 B to his daughter's number Efraim Kaufmann is able to verify HIPAA [facility] Care Management RN reviewed and addressed red alerts for day 1 and 3 with her   EMMI   Follow up appointment Efraim Kaufmann is able to confirm that Mr Magrini has f/u appointment on Tuesday 06/14/2018 at 1015 with Dr Nehemiah Settle, primary MD but no call from neurology office at this time  [name] then informed CM that Mr Gropper was looking for a new primary care MD Efraim Kaufmann has not had success with finding one per list of MDs provided  [facility] RN CM assisted by entering in united health care web site the preference of in network MD as an internal medicine MD, familiar with diabetes, male and near the home address [facility] RN CM did call a few MD offices to inquire about MDs accepting new patients   [place] at [place] (Drs to include Dr Salomon Fick, Guy Begin (all females))  Left voice message [facility]- pending a return call back to [facility]  Shirley called back from [place] Discussed his appointment on tomorrow and transferred to the medical assistant, Agustin Cree,  to leave a voice message related  to his synthroid Pending a return call back from [place].   Cm called to update [name] on the calls to MD offices, available MDs accepting new patients and the message left at the primary care MD office related to medication dosage She confirms Mr Cipres has a cbg meter and they are checking his cbg tid (he is not doing it) She inquired about the [place] freestyle. Cm answered questions and discussed there are medicare guidelines to getting one  CM and daughter discussed BP cuffs CM discussed the use of the  [place] health care health products benefit catalog to purchase one or [employer]   Home health services has [facility] RN, Elita Quick, visited on Friday 06/10/18 and is pending Dr Nehemiah Settle orders for further services like HH OT, PT, RT, SW   [name] to discuss this with Dr Nehemiah Settle on 06/14/2018   Medication concern [name], daughter, noted with reconcilation of Mr Wynder a possible discrepancy in synthroid dosage. CM reviewed After summary list and discharge summary noted to find 75 mcg listd but daughter reports 50 mcg bottles at the home also. Called and left a message for Dr Idelle Crouch RN/MA, Agustin Cree. Pending a return call back    Social Mrs Boice is living a the home with family or friends staying with him at intervals but prior to stroke lived alone. He is retired. He is independent/assist in care needs and a the family assists him  to medical appointments. 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 Patient gave verbal consent for services.   Advised patient that there will be further automated EMMI-post discharge calls to assess how the patient is doing following the recent hospitalization Advised the patient that another  call may be received from a nurse if any of their responses were abnormal. Patient voiced understanding and was appreciative of f/u call.  Plan: [facility] RN CM follow up with Pt and daughter related to medical providers, home health and primary MD follow up appointments within 4 business days  Pt encouraged to return a call to [facility] RN CM prn   Jeramine Delis L. Noelle Penner, RN, BSN, CCM [facility] Telephonic Care Management Care Coordinator Office number [phone] Mobile number [phone]  Main [facility] number [phone] Fax number [phone]

## 2018-06-14 ENCOUNTER — Other Ambulatory Visit: Payer: Self-pay | Admitting: *Deleted

## 2018-06-14 NOTE — Patient Outreach (Signed)
[facility]) Care Management  06/14/2018  Kyton Bia [birth year] [id number]   Care coordination   [facility] RN CM left a message for [facility] staff member Primitivo Gauze to inquire about the start date of services for [facility] PT/OT/Sw ? Speech for pt  Pending a return call back   [facility] RN CM called [facility] [phone] Spoke with Lowella Bandy who is attempting to get pt possible scheduled with Shanda Bumps, NP  [facility] RN CM spoke with Efraim Kaufmann at DM [facility] Nutrition who confirms Pt's daughter called on 06/09/2018 and states she will call back to schedule.  The referral staff will return a call if daughter does not call back   Call to [place] neurology left a message to inquire about MDs who may be accepting new patients   Follow up call with daughter, Efraim Kaufmann to updated her on calls from [place], [place], and  followed up on office visit with Dr Nehemiah Settle. Next f/u is September 14 2018.CM discussed calls to [facility] neurology with a pending call from [facility], referral coordinator to schedule with [name]. Daughter voiced understanding and appreciation. Discussed a call to [facility] with a pending call back to CM to inform CM if any provider is accepting new patients  Discussed [facility] to [facility] with pending call from scheduler, Trula Ore and reviewed the ordered services and general process of receiving a call from each discipline to set up a visit time. She confirms there has been a call from the [facility] SW, Doris, for a visit on 06/15/2018 She voiced understanding. CM reviewed the call to the DM and nutrition center and that she needs to return a call to [facility], referral coordinator. CM provided daughter with the contact number to call DM and nutrition center She reports she does not recall speaking with staff on 06/09/2018 and it may have been other family members   CM reviewed the options for possible a new primary care provider. Daughter wants to speak further with the brother since they  had a good MD visit today and discuss the options of Dr Cyndia Bent (accepting new patients but out to May 20) and Dr Ardyth Harps before making a decision Reports in today's MD visit they discussed change in synthroid dose and home health services   CM encouraged a call back if CM could assist in future  CM and [name] discussed case closure at this time   [facility] RN CM will close case at this time as patient has been assessed and no needs identified/needs resolved.   Pt encouraged to return a call to [facility] RN CM prn  Tracen Mahler L. Noelle Penner, RN, BSN, CCM [facility] Telephonic Care Management Care Coordinator Office number [phone] Mobile number [phone]  Main [facility] number [id number] Fax number [phone]

## 2018-06-14 NOTE — Patient Outreach (Signed)
[facility]) Care Management  06/14/2018  Jawad Giacomelli Aug 29, 1945 [id number]   Care coordination   [facility] RN CM received a call from [place], [place] of Dr Nehemiah Settle Reviewed Daughter dosage concern with synthroid Darlene confirms Synthroid was 75 mcg and had been changed by Dr Idelle Crouch office Cm discussed need for home health service approval for the patient Darlene confirms synthroid and home health will be address during the office visit today    Plan: [facility] RN CM follow up with Pt and daughter related to medical providers, home health and primary MD follow up appointments within 4 business days   Lakeline L. Noelle Penner, RN, BSN, CCM [facility] Telephonic Care Management Care Coordinator Office number [phone] Mobile number [phone]  Main [facility] number [phone] Fax number [phone]

## 2018-06-15 ENCOUNTER — Ambulatory Visit: Payer: Self-pay | Admitting: *Deleted

## 2018-06-15 ENCOUNTER — Other Ambulatory Visit: Payer: Self-pay | Admitting: *Deleted

## 2018-06-15 NOTE — Patient Outreach (Signed)
[facility]) Care Management  06/15/2018  Roger Ramirez Nov 25, 1945 [id number]   EMMI-stroke  RED ON EMMI ALERT Day # 6 Date: 06/14/18 1003 Red Alert Reason: Feeling worse overall? Yes  New or worsening pain/fever/shortness of breath? Yes  Insurance: united health care medicare    Outreach attempt #1 successful to the daughter with permission of the patient  She is able to verify HIPAA [facility] Care Management RN reviewed and addressed red alert with her  She states the answers were correct as the patient and his grand daughter, Roger Ramirez were attempting to get to the primary MD office around the time of the 06/14/18 1003  EMMI call was placed. She will discuss this with her daughter Roger Ramirez who has been assisting [name] at home to monitor him for any further pain, fever, sob or feeling worse. Efraim Kaufmann and Roger Ramirez did attend the MD appointment with [name] at 1015 3/320 and none of these s/s were addressed during the appointment per [facility].  [name] states when she arrived to the appointment to meet [name] he did seem out of breath but it resolved  CM updated her also on the call from [place] about the upcoming calls from further [facility] staff arrival [facility] and Efraim Kaufmann also discussed that if [name] is having some sob, pain or fever the [facility] staff will also notify Dr Nehemiah Settle She voiced understanding   Consent: [facility] RN CM reviewed [facility] services with patient. Patient gave verbal consent for services.   Advised patient that there will be further automated EMMI- post discharge calls to assess how the patient is doing following the recent hospitalization Advised the patient that another call may be received from a nurse if any of their responses were abnormal. Patient voiced understanding and was appreciative of f/u call.   Plan: [facility] RN CM will close case at this time as patient has been assessed and no needs identified/needs resolved.   Pt encouraged to return a call to [facility] RN  CM prn if s/s are noted further   Kimberly L. Noelle Penner, RN, BSN, CCM [facility] Telephonic Care Management Care Coordinator Office number [phone] Mobile number [phone]  Main [facility] number [phone] Fax number [phone]

## 2018-06-15 NOTE — Patient Outreach (Signed)
[facility]) Care Management  06/15/2018  Roger Ramirez Aug 01, 1945 [id number]   Care coordination  [facility] RN CM received a return call from [place] [facility] after CM left a message on 06/14/18 Roger Ramirez and CM discussed daughter's inquiry of start date of home services from [facility] OT, ST and SW. Roger Ramirez confirmed that Dr Idelle Crouch approval was received and the other disciplines with contact pt or daughter. Cm and Roger Ramirez verified correct number for daughter to be contacted as pt with speech and possible memory concerns related to CVA. Roger Ramirez to share this with the [facility] staff members    Cala Bradford L. Noelle Penner, RN, BSN, CCM [facility] Telephonic Care Management Care Coordinator Office number [phone] Mobile number [phone]  Main [facility] number [phone] Fax number [phone]

## 2018-07-11 ENCOUNTER — Ambulatory Visit: Payer: Medicare Other | Admitting: Skilled Nursing Facility1

## 2018-07-19 ENCOUNTER — Other Ambulatory Visit: Payer: Self-pay

## 2018-07-19 ENCOUNTER — Encounter: Payer: Medicare Other | Attending: Internal Medicine | Admitting: Skilled Nursing Facility1

## 2018-07-19 ENCOUNTER — Encounter: Payer: Self-pay | Admitting: Skilled Nursing Facility1

## 2018-07-19 DIAGNOSIS — E119 Type 2 diabetes mellitus without complications: Secondary | ICD-10-CM | POA: Diagnosis present

## 2018-07-19 NOTE — Progress Notes (Signed)
Diabetes Self-Management Education  Visit Type: First/Initial  07/19/2018  [name], identified by name and date of birth, is a 73 y.o. male with a diagnosis of Diabetes: Type 2.   ASSESSMENT  Height 5\' 9"  (1.753 m), weight 215 lb (97.5 kg). Body mass index is 31.75 kg/m.  Pts A1C 8.4 Pt arrives with his daughter due to limited cognition. Pts family checks his blood sugar for him fasting: 119, 136, 107 states he may check it after eating sometimes 140, 122, 129 Pt does have a recent hx of stroke.  Pts daughter states the father sneaks snacks so sometimes his blood sugars are not fasting.  Pts daughter states he has a set eating schedule.  Pt states he cuts his own toenails.  Daughter took copious notes during appt.   Diabetes Self-Management Education - 07/19/18 1000      Visit Information   Visit Type  First/Initial      Initial Visit   Diabetes Type  Type 2    Are you currently following a meal plan?  No    Are you taking your medications as prescribed?  Yes      Health Coping   How would you rate your overall health?  Fair      Psychosocial Assessment   Patient Belief/Attitude about Diabetes  Afraid    Self-management support  Family    Other persons present  Family Member      Pre-Education Assessment   Patient understands the diabetes disease and treatment process.  Needs Instruction    Patient understands incorporating nutritional management into lifestyle.  Needs Instruction    Patient undertands incorporating physical activity into lifestyle.  Needs Instruction    Patient understands using medications safely.  Needs Instruction    Patient understands monitoring blood glucose, interpreting and using results  Needs Instruction    Patient understands prevention, detection, and treatment of acute complications.  Needs Instruction    Patient understands prevention, detection, and treatment of chronic complications.  Needs Instruction    Patient understands  how to develop strategies to address psychosocial issues.  Needs Instruction    Patient understands how to develop strategies to promote health/change behavior.  Needs Instruction      Complications   Last HgB A1C per patient/outside source  8.4 %    How often do you check your blood sugar?  1-2 times/day    Fasting Blood glucose range (mg/dL)  17-616    Number of hypoglycemic episodes per month  0    Number of hyperglycemic episodes per week  0    Have you had a dilated eye exam in the past 12 months?  No    Have you had a dental exam in the past 12 months?  Yes    Are you checking your feet?  No      Dietary Intake   Breakfast  oatmeal with bluberries with splenda and orange juice or eggs and fruit    Snack (morning)  dry cereal     Lunch  chicken and squash     Dinner  salmon and cabbage and lima beans     Beverage(s)  black coffee, orange juice, sweet tea with stevia       Exercise   Exercise Type  ADL's      Patient Education   Nutrition management   Role of diet in the treatment of diabetes and the relationship between the three main macronutrients and blood glucose level;Food label reading,  portion sizes and measuring food.;Reviewed blood glucose goals for pre and post meals and how to evaluate the patients' food intake on their blood glucose level.;Meal timing in regards to the patients' current diabetes medication.;Meal options for control of blood glucose level and chronic complications.    Physical activity and exercise   Identified with patient nutritional and/or medication changes necessary with exercise.;Role of exercise on diabetes management, blood pressure control and cardiac health.    Medications  Reviewed patients medication for diabetes, action, purpose, timing of dose and side effects.    Monitoring  Taught/evaluated SMBG meter.;Purpose and frequency of SMBG.;Daily foot exams;Yearly dilated eye exam;Identified appropriate SMBG and/or A1C goals.;Taught/discussed  recording of test results and interpretation of SMBG.    Acute complications  Discussed and identified patients' treatment of hyperglycemia.;Taught treatment of hypoglycemia - the 15 rule.;Covered sick day management with medication and food.    Chronic complications  Assessed and discussed foot care and prevention of foot problems;Retinopathy and reason for yearly dilated eye exams;Dental care;Nephropathy, what it is, prevention of, the use of ACE, ARB's and early detection of through urine microalbumia.    Psychosocial adjustment  Role of stress on diabetes    Personal strategies to promote health  Lifestyle issues that need to be addressed for better diabetes care      Individualized Goals (developed by patient)   Nutrition  General guidelines for healthy choices and portions discussed;Adjust meds/carbs with exercise as discussed    Physical Activity  Exercise 5-7 days per week;30 minutes per day      Post-Education Assessment   Patient understands the diabetes disease and treatment process.  Demonstrates understanding / competency    Patient understands incorporating nutritional management into lifestyle.  Demonstrates understanding / competency    Patient undertands incorporating physical activity into lifestyle.  Demonstrates understanding / competency    Patient understands using medications safely.  Demonstrates understanding / competency    Patient understands monitoring blood glucose, interpreting and using results  Demonstrates understanding / competency    Patient understands prevention, detection, and treatment of acute complications.  Demonstrates understanding / competency    Patient understands prevention, detection, and treatment of chronic complications.  Demonstrates understanding / competency    Patient understands how to develop strategies to address psychosocial issues.  Demonstrates understanding / competency    Patient understands how to develop strategies to promote  health/change behavior.  Demonstrates understanding / competency      Outcomes   Expected Outcomes  Demonstrated interest in learning. Expect positive outcomes    Future DMSE  PRN    Program Status  Completed       Individualized Plan for Diabetes Self-Management Training:   Learning Objective:  Patient will have a greater understanding of diabetes self-management. Patient education plan is to attend individual and/or group sessions per assessed needs and concerns.   Plan:   -Check your feet every day -Feed the deer every day carrying your buckets balance your meals with protein -Have unsweet tea with alternative sugar -Ask your doctor for a podiatrist referral for toe nail care -Aim for your blood sugar to always be over 80 -Watch for signs/symptoms highs/lows and treat appropriately   Expected Outcomes:  Demonstrated interest in learning. Expect positive outcomes  Education material provided: ADA - How to Thrive: A Guide for Your Journey with Diabetes, My Plate and Support group flyer  If problems or questions, patient to contact team via:  Email  Future DSME appointment: PRN

## 2018-07-20 ENCOUNTER — Ambulatory Visit (INDEPENDENT_AMBULATORY_CARE_PROVIDER_SITE_OTHER): Payer: Medicare Other | Admitting: Adult Health

## 2018-07-20 ENCOUNTER — Encounter: Payer: Self-pay | Admitting: Adult Health

## 2018-07-20 VITALS — BP 114/76 | HR 59

## 2018-07-20 DIAGNOSIS — I63512 Cerebral infarction due to unspecified occlusion or stenosis of left middle cerebral artery: Secondary | ICD-10-CM

## 2018-07-20 DIAGNOSIS — E785 Hyperlipidemia, unspecified: Secondary | ICD-10-CM

## 2018-07-20 DIAGNOSIS — E119 Type 2 diabetes mellitus without complications: Secondary | ICD-10-CM

## 2018-07-20 DIAGNOSIS — I10 Essential (primary) hypertension: Secondary | ICD-10-CM

## 2018-07-20 DIAGNOSIS — E539 Vitamin B deficiency, unspecified: Secondary | ICD-10-CM

## 2018-07-20 NOTE — Patient Instructions (Signed)
Continue aspirin 81 mg daily and Xarelto (rivaroxaban) daily  and lipitor 10mg   for secondary stroke prevention  Continue to follow up with PCP regarding cholesterol, blood pressure, diabetes and Vit B deficiency management   Recommend to hold off on driving at this time until you are able to undergo driving assessment/rehabilitation to ensure safety in regards to cognition with multitasking and reaction time.  It is also recommended for you not to go into any type of grocery stores or any other place with multiple people due to COVID-19 safety concerns as you are high risk patient  Continue to stay active maintain healthy diet  Continue to monitor blood pressure at home with recording  Maintain strict control of hypertension with blood pressure goal below 130/90, diabetes with hemoglobin A1c goal below 6.5% and cholesterol with LDL cholesterol (bad cholesterol) goal below 70 mg/dL. I also advised the patient to eat a healthy diet with plenty of whole grains, cereals, fruits and vegetables, exercise regularly and maintain ideal body weight.  Followup in the future with me in 6 months or call earlier if needed       Thank you for coming to see [place] at [facility]. I hope we have been able to provide you high quality care today.  You may receive a patient satisfaction survey over the next few weeks. We would appreciate your feedback and comments so that we may continue to improve ourselves and the health of our patients.

## 2018-07-20 NOTE — Progress Notes (Signed)
[facility] [address] [place]. [place] [address] [phone]       VIRTUAL VISIT FOLLOW UP NOTE  [name] Date of Birth:  1945/05/20 Medical Record Number:  [id number]   Reason for Referral:  hospital stroke follow up    Virtual Visit via Video Note  I connected with Roger Ramirez  on 07/20/18 at  8:15 AM EDT by a video enabled telemedicine application located at [facility] in [place], [place] and verified that I am speaking with the correct person using two identifiers who was located at their own home and was accompanied by his daughter, Roger Ramirez.   I discussed the limitations of evaluation and management by telemedicine and the availability of in person appointments. The patient expressed understanding and agreed to proceed.   CHIEF COMPLAINT:  Chief Complaint  Patient presents with  . Cerebrovascular Accident    hospital follow up - doing well without residual deficits     HPI: Roger Ramirez had initial hospital follow-up face-to-face visit today regarding left CR infarct secondary to small vessel disease on 06/06/2018 but due to COVID-19 safety precautions, visit transitioned to telemedicine via WebEx. History obtained from patient, daughter and chart review. Reviewed all radiology images and labs personally.  Roger Ramirez is a 73 y.o. male with history of DM, HTN, HLD, PE and APL on Xarelto  who presented to [facility] ED for right facial droop and slurry speech. No tPA given due to on Xarelto.  MRI reviewed and showed left CR small infarcts.  MRA and carotid Dopplers unremarkable.  2D echo showed an EF of 50 to 55%.  Left CR small infarct likely secondary to small vessel disease given risk factors and stroke location.  Previously on Xarelto and recommended continuation with addition of aspirin 81 mg daily for secondary stroke prevention.  A1c 8.4 and recommended close PCP follow-up for DM management.  HTN stable during admission and  recommended long-term BP goal normotensive range and close PCP follow-up.  LDL 56 and recommended continuation of atorvastatin 10 mg daily.  History of B12 deficiency with B12 level 86 with initiation of B12 1038mg daily and close PCP follow-up for ongoing monitoring.  History of APL syndrome with history of PE and on Xarelto for management.  Other stroke risk factors include advanced age, former tobacco use and obesity.  He was discharged home in stable condition without therapy recommendations with resultant right facial droop but no other neurological deficits.   He has been doing well since returning home without residual deficits.  He is currently residing within his own home but has been having family stay with him 24/7 since hospital discharge.  Daughter is questioning whether this needs to continue and was awaiting for today's visit for further recommendations.  After hospital discharge, he did have home therapies including PT/OT/ST but since have all been completed.  He has returned back to all prior activities except for driving and is able to maintain all ADLs and majority of IADLs.  He does not cook as family helps assist with meal prep and this assistance was being done prior to his stroke.  He ambulates safely without assistive device or any recent falls.  Daughter does endorse some memory loss but she believes this has been stable without any worsening since her stroke.  Previously, he was driving only short distances such to the grocery store but daughter has concerns regarding him driving even prior to the stroke and is interested in him undergoing  driving rehab to ensure safety prior to him returning to driving.  He has continued on aspirin 81 mg along with Xarelto without side effects of bleeding or bruising.  Continues on atorvastatin 10 mg without side effects myalgias.  Blood pressure is monitored at home which has been stable and during visit readings 114/76 with HR 59.  Glucose also  monitored with this morning readings 118 fasting.  He did have hospital follow-up with PCP in the beginning of March and daughter endorses that blood work was obtained but unsure what lab work was done.  He has continued taking vitamin B supplements.  No further concerns at this time.  Denies new or worsening stroke/TIA symptoms.   ROS:   14 system review of systems performed and negative with exception of memory loss  PMH:  Past Medical History:  Diagnosis Date  . Diabetes mellitus without complication ([place])   . Hypertension   . Pulmonary embolism ([facility])     PSH: No past surgical history on file.  Social History:  Social History   Socioeconomic History  . Marital status: Divorced    Spouse name: Not on file  . Number of children: Not on file  . Years of education: Not on file  . Highest education level: Not on file  Occupational History  . Not on file  Social Needs  . Financial resource strain: Not on file  . Food insecurity:    Worry: Not on file    Inability: Not on file  . Transportation needs:    Medical: Not on file    Non-medical: Not on file  Tobacco Use  . Smoking status: Former Smoker    Packs/day: 1.00    Years: 20.00    Pack years: 20.00    Types: Cigarettes  . Smokeless tobacco: Never Used  . Tobacco comment: Quit 1970's  Substance and Sexual Activity  . Alcohol use: Yes    Alcohol/week: 0.0 standard drinks    Comment: Occasional beer / 1 every 2 or 3 weeks  . Drug use: No  . Sexual activity: Not on file  Lifestyle  . Physical activity:    Days per week: Not on file    Minutes per session: Not on file  . Stress: Not on file  Relationships  . Social connections:    Talks on phone: Not on file    Gets together: Not on file    Attends religious service: Not on file    Active member of club or organization: Not on file    Attends meetings of clubs or organizations: Not on file    Relationship status: Not on file  . Intimate partner violence:     Fear of current or ex partner: Not on file    Emotionally abused: Not on file    Physically abused: Not on file    Forced sexual activity: Not on file  Other Topics Concern  . 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  Medications:   Current Outpatient Medications on File Prior to Visit  Medication Sig Dispense Refill  . atorvastatin (LIPITOR) 10 MG tablet Take 10 mg by mouth daily.    . blood glucose meter kit and supplies KIT Dispense based on patient and insurance preference. Use up to four times daily as directed. (  FOR ICD-9 250.00, 250.01). 1 each 0  . ibuprofen (ADVIL,MOTRIN) 800 MG tablet Take 800 mg by mouth every 8 (eight) hours as needed for moderate pain.     Marland Kitchen irbesartan (AVAPRO) 150 MG tablet Take 75 mg by mouth daily.    Marland Kitchen levothyroxine (SYNTHROID, LEVOTHROID) 75 MCG tablet Take 75 mcg by mouth every morning.    . metFORMIN (GLUCOPHAGE) 1000 MG tablet Take 1,000 mg by mouth 2 (two) times daily.    . rivaroxaban (XARELTO) 20 MG TABS tablet Take 1 tablet (20 mg total) by mouth daily. 90 tablet 3  . vitamin B-12 1000 MCG tablet Take 1 tablet (1,000 mcg total) by mouth daily. 30 tablet 0   No current facility-administered medications on file prior to visit.     Allergies:  No Known Allergies   Physical Exam  Vitals:   07/20/18 0844  BP: 114/76  Pulse: (!) 59   *Vital signs obtained by patient within his own home by his own device*  Depression screen [facility] 2/9 07/20/2018  Decreased Interest 0  Down, Depressed, Hopeless 0  PHQ - 2 Score 0     General: well developed, well nourished, pleasant elderly Caucasian male, seated, in no evident distress Head: head normocephalic and atraumatic.     Neurologic Exam Mental Status: Awake and fully alert.  No evidence of speech difficulty or deficits.  Oriented to place and  time. Recent and remote memory intact. Attention span, concentration and fund of knowledge appropriate. Mood and affect appropriate.  Cranial Nerves: Extraocular movements full without nystagmus. Hearing slightly diminished to voice. Facial sensation intact. Face, tongue, palate moves normally and symmetrically.  Shoulder shrug symmetric. Motor: No evidence of weakness project assessment Sensory.:  Intact to light touch with daughter assistance Coordination: Rapid alternating movements normal in all extremities. Finger-to-nose and heel-to-shin performed accurately bilaterally. Gait and Station: Arises from chair with mild difficulty. Stance is normal. Gait demonstrates normal stride length and balance without use of assistive device Reflexes: Unable to assess   NIHSS  0 Modified Rankin  0    Diagnostic Data (Labs, Imaging, Testing)  CT HEAD WO CONTRAST 06/06/2018 IMPRESSION: 1. No acute intracranial abnormality. 2. Atrophy and white matter disease appears chronic. 3. Lacunar infarct of the right caudate head appears remote. 4. ASPECTS is 10/10  MR BRAIN WO CONTRAST MR MRA HEAD  06/06/2018 IMPRESSION: Subcentimeter acute infarction in the left posterior frontal subcortical white matter. Atrophy and chronic small-vessel ischemic changes elsewhere throughout the brain.  Negative MRA for large or medium vessel occlusion or correctable proxiIMPRESSIONS   ECHOCARDIOGRAM 06/06/2018  1. The left ventricle has low normal systolic function, with an ejection fraction of 50-55%. The cavity size was mildly dilated. Left ventricular diastolic Doppler parameters are consistent with impaired relaxation.  2. The right ventricle has normal systolic function. The cavity was normal. There is no increase in right ventricular wall thickness.  3. The mitral valve is normal in structure. Mild thickening of the mitral valve leaflet.  4. The tricuspid valve is normal in structure.  5. The aortic  valve was not well visualized mild stenosis of the aortic valve.  6. AV is thickened. Difficult to see. Peak and mean gradients through the valve are 35 and 18 mm Hg respectively consistent with mild AS.  7. COmpared to previous report from 2016, mean gradient across AV is mildly increased from 12 mm Hg. RV function appears normal.  8. Poor acoustic windows limit exam.repeat Mal stenosis.  VAS US  CAROTID DUPLEX BILATERAL 06/06/2018 Summary: Right Carotid: Velocities in the right ICA are consistent with a 1-39% stenosis. Left Carotid: Velocities in the left ICA are consistent with a 1-39% stenosis. Vertebrals: Bilateral vertebral arteries demonstrate antegrade flow.   ASSESSMENT: Roger Ramirez is a 73 y.o. year old male here with left CR infarct on 06/06/2018 secondary to small vessel disease. Vascular risk factors include DM, HTN, HLD and prior tobacco use.  He has been doing well from a stroke standpoint without residual deficits or reoccurring symptoms.    PLAN:  1. Left CR infarct: Continue aspirin 81 mg daily and Xarelto (rivaroxaban) daily  and lipitor 18m  for secondary stroke prevention. Maintain strict control of hypertension with blood pressure goal below 130/90, diabetes with hemoglobin A1c goal below 6.5% and cholesterol with LDL cholesterol (bad cholesterol) goal below 70 mg/dL.  I also advised the patient to eat a healthy diet with plenty of whole grains, cereals, fruits and vegetables, exercise regularly with at least 30 minutes of continuous activity daily and maintain ideal body weight. 2. HTN: Advised to continue current treatment regimen.  Today's BP 114/76.  Advised to continue to monitor at home along with continued follow-up with PCP for management 3. HLD: Advised to continue current treatment regimen along with continued follow-up with PCP for future prescribing and monitoring of lipid panel 4. DMII: Advised to continue to monitor glucose levels at home along with continued  follow-up with PCP for management and monitoring 5. Vitamin B deficiency: Continuation of vitamin B 10053m daily supplementation and ongoing monitoring and management by PCP 6. Per today's conversation and assessment, it is felt as though he would be safe to maintain living on his own without 24/7 supervision.  Did recommend a family member checks on him daily or every other day for the first couple weeks to ensure safety.  In regards to driving, it is recommended that he hold off for the time being until he is able to undergo driving assessment/rehab.  Advised him that he should not be going to the grocery store or any other type of store at this time due to COVID-19 safety concerns as he is a high risk patient.  He verbalized understanding.   Follow up in 6 months or call earlier if needed   Greater than 50% of time during this 25 minute visit was spent on counseling, explanation of diagnosis of left CR infarct, reviewing risk factor management of HLD, HTN, DM, planning of further management along with potential future management, and discussion with patient and family answering all questions.    JeVenancio PoissonAGNP-BC  [facility] [address]uToro [place]rRising [place] [id number] [phone] [phone]: This document was prepared with digital dictation and possible smart phrase technology. Any transcriptional errors that result from this process are unintentional.

## 2018-07-24 NOTE — Progress Notes (Signed)
I agree with the above plan 

## 2018-07-27 ENCOUNTER — Encounter: Payer: Self-pay | Admitting: Podiatry

## 2018-07-27 ENCOUNTER — Ambulatory Visit: Payer: Medicare Other | Admitting: Podiatry

## 2018-07-27 ENCOUNTER — Other Ambulatory Visit: Payer: Self-pay

## 2018-07-27 VITALS — BP 153/84 | HR 78 | Resp 16

## 2018-07-27 DIAGNOSIS — M79674 Pain in right toe(s): Secondary | ICD-10-CM

## 2018-07-27 DIAGNOSIS — B351 Tinea unguium: Secondary | ICD-10-CM | POA: Diagnosis not present

## 2018-07-27 DIAGNOSIS — M79675 Pain in left toe(s): Secondary | ICD-10-CM

## 2018-07-27 DIAGNOSIS — Z9229 Personal history of other drug therapy: Secondary | ICD-10-CM

## 2018-07-28 ENCOUNTER — Encounter: Payer: Self-pay | Admitting: Podiatry

## 2018-07-28 NOTE — Progress Notes (Signed)
Subjective: Roger Ramirez presents today referred by Seward Carol, MD . He is accompanied by his daughter who assists with his medical history on today's visit. Chief concern is thick, tender, discolored toenails b/l in presence of diabetes.   He has been diabetic for about 2 years.  Mr. Philbrook denies any history of foot wounds. He denies numbness, tingling, pins/needles, or burning in his feet.  Past Medical History:  Diagnosis Date  . Diabetes mellitus without complication ([place])   . Hypertension   . [facility]ism [facility]      Patient Active Problem List   Diagnosis Date Noted  . Cerebral infarction ([place])   . CVA (cerebral vascular accident) ([place]) 06/06/2018  . Antiphospholipid syndrome ([place]) 06/06/2018  . Diabetes mellitus type 2, noninsulin dependent ([place]) 06/06/2018  . Hypercholesterolemia with hypertriglyceridemia 01/23/2016  . Impaired fasting glucose 01/23/2016  . B12 deficiency 01/23/2016  . Hypothyroid 01/23/2016  . Cognitive dysfunction 01/23/2016  . Pleuritic chest pain   . Leg DVT (deep venous thromboembolism), acute ([place])   . Near syncope   . Elevated BP   . History of pulmonary embolus (PE)   . Pulmonary embolism ([place]) 12/24/2014     History reviewed. No pertinent surgical history.    Current Outpatient Medications:  .  ACCU-CHEK AVIVA PLUS test strip, USE TO TEST BLOOD SUGAR QD, Disp: , Rfl:  .  Accu-Chek FastClix Lancets MISC, USE TO CHECK BLOOD GLUCOSE UP TO QID UTD, Disp: , Rfl:  .  atorvastatin (LIPITOR) 10 MG tablet, Take 10 mg by mouth daily., Disp: , Rfl:  .  blood glucose meter kit and supplies KIT, Dispense based on patient and insurance preference. Use up to four times daily as directed. (FOR ICD-9 250.00, 250.01)., Disp: 1 each, Rfl: 0 .  ibuprofen (ADVIL,MOTRIN) 800 MG tablet, Take 800 mg by mouth every 8 (eight) hours as needed for moderate pain. , Disp: , Rfl:  .  irbesartan (AVAPRO) 150 MG tablet, Take 75 mg by mouth daily., Disp: , Rfl:  .   levothyroxine (SYNTHROID, LEVOTHROID) 50 MCG tablet, TK 1 T PO QD, Disp: , Rfl:  .  metFORMIN (GLUCOPHAGE) 1000 MG tablet, Take 1,000 mg by mouth 2 (two) times daily., Disp: , Rfl:  .  rivaroxaban (XARELTO) 20 MG TABS tablet, Take 1 tablet (20 mg total) by mouth daily., Disp: 90 tablet, Rfl: 3 .  vitamin B-12 1000 MCG tablet, Take 1 tablet (1,000 mcg total) by mouth daily., Disp: 30 tablet, Rfl: 0   No Known Allergies   Social History   Occupational History  . Not on file  Tobacco Use  . Smoking status: Former Smoker    Packs/day: 1.00    Years: 20.00    Pack years: 20.00    Types: Cigarettes  . Smokeless tobacco: Never Used  . Tobacco comment: Quit 1970's  Substance and Sexual Activity  . Alcohol use: Yes    Alcohol/week: 0.0 standard drinks    Comment: Occasional beer / 1 every 2 or 3 weeks  . Drug use: No  . Sexual activity: Not on file     History reviewed. No pertinent family history.   Immunization History  Administered Date(s) Administered  . Influenza-Unspecified 01/07/2018  . Pneumococcal-Unspecified 11/12/2010, 11/22/2013  . Tdap 09/04/2015     Review of systems: Positive Findings in bold print.  Constitutional:  chills, fatigue, fever, sweats, weight change Communication: Optometrist, sign Ecologist, hand writing, iPad/Android device Head: headaches, head injury Eyes: changes in vision, eye  pain, glaucoma, cataracts, macular degeneration, diplopia, glare,  light sensitivity, eyeglasses or contacts, blindness Ears nose mouth throat: hearing impaired, hearing aids,  ringing in ears, deaf, sign language,  vertigo,   nosebleeds,  rhinitis,  cold sores, snoring, swollen glands Cardiovascular: HTN, edema, arrhythmia, pacemaker in place, defibrillator in place, chest pain/tightness, chronic anticoagulation, blood clot, heart failure, MI, pulmonary embolism Peripheral Vascular: leg cramps, varicose veins, blood clots, lymphedema, varicosities Respiratory:   difficulty breathing, denies congestion, SOB, wheezing, cough, emphysema Gastrointestinal: change in appetite or weight, abdominal pain, constipation, diarrhea, nausea, vomiting, vomiting blood, change in bowel habits, abdominal pain, jaundice, rectal bleeding, hemorrhoids, GERD Genitourinary:  nocturia,  pain on urination, polyuria,  blood in urine, Foley catheter, urinary urgency, ESRD on hemodialysis Musculoskeletal: amputation, cramping, stiff joints, painful joints, decreased joint motion, fractures, OA, gout, hemiplegia, paraplegia, uses cane, wheelchair bound, uses walker, uses rollator Skin: +changes in toenails, color change, dryness, itching, mole changes,  rash, wound(s) Neurological: headaches, numbness in feet, paresthesias in feet, burning in feet, fainting,  seizures, change in speech. denies headaches, memory problems/poor historian, cerebral palsy, weakness, paralysis, CVA, TIA Endocrine: diabetes, hypothyroidism, hyperthyroidism,  goiter, dry mouth, flushing, heat intolerance,  cold intolerance,  excessive thirst, denies polyuria, nocturia Hematological:  easy bleeding, excessive bleeding, easy bruising, enlarged lymph nodes, on long term blood thinner, history of past transusions Allergy/immunological:  hives, eczema, frequent infections, multiple drug allergies, seasonal allergies, transplant recipient, multiple food allergies Psychiatric:  anxiety, depression, mood disorder, suicidal ideations, hallucinations, insomnia  Objective: Vitals:   07/27/18 0948  BP: (!) 153/84  Pulse: 78  Resp: 16   Vascular Examination: Capillary refill time immediate x 10 digits.  Dorsalis pedis pulses palpable b/l.   Posterior tibial pulses palpable b/l.   No digital hair x 10 digits.  Skin temperature gradient WNL b/l  Dermatological Examination: Skin with normal turgor, texture and tone b/l.  Toenails 1-5 b/l discolored, thick, dystrophic with subungual debris and pain with  palpation to nailbeds due to thickness of nails.  Musculoskeletal: Muscle strength 5/5 to all LE muscle groups  Neurological: Sensation intact with 10 gram monofilament.  Vibratory sensation intact.  Assessment: 1. Painful onychomycosis toenails 1-5 b/l   Plan: 1. Discussed onychomycosis and treatment options.  2. Toenails 1-5 b/l were debrided in length and girth without iatrogenic bleeding. 3. Patient to continue soft, supportive shoe gear daily. 4. Patient to report any pedal injuries to medical professional immediately. 5. Follow up 3 months.  6. Patient/POA to call should there be a concern in the interim.

## 2018-08-08 ENCOUNTER — Encounter: Payer: Self-pay | Admitting: Rehabilitative and Restorative Service Providers"

## 2018-08-08 ENCOUNTER — Other Ambulatory Visit: Payer: Self-pay

## 2018-08-08 ENCOUNTER — Ambulatory Visit: Payer: Medicare Other | Attending: Internal Medicine | Admitting: Rehabilitative and Restorative Service Providers"

## 2018-08-08 DIAGNOSIS — R2689 Other abnormalities of gait and mobility: Secondary | ICD-10-CM | POA: Insufficient documentation

## 2018-08-08 DIAGNOSIS — R2681 Unsteadiness on feet: Secondary | ICD-10-CM | POA: Diagnosis present

## 2018-08-08 NOTE — Patient Instructions (Signed)
Access Code: [email or web address]  URL: [email or web address]  Date: 08/08/2018  Prepared by: Margretta Ditty   Program Notes  ENDURANCE: Use your stationery bike for 10 minutes to begin. Slowly work up by adding 2 minutes extra per week. Goal is to do 20-30 minutes nonstop. Also, can walk with your family when they visit for endurance activities.   Exercises Tandem Stance in Corner - 3 reps - 1 sets - 30 seconds hold - 1x daily - 7x weekly Single Leg Stance - 3 reps - 1 sets - 10 seconds hold - 1x daily - 7x weekly

## 2018-08-09 NOTE — Therapy (Signed)
[facility] [address] [place], [place], [address] Phone: [phone]   Fax:  [phone]  Physical Therapy Evaluation  Patient Details  Name: Roger Ramirez MRN: [id number] Date of Birth: 1945/08/02 Referring Provider (PT): Renford Dills, MD   Encounter Date: 08/08/2018  PT End of Session - 08/08/18 1402    Visit Number  1    Number of Visits  4    Date for PT Re-Evaluation  10/08/18    Authorization Type  [employer] medicare    PT Start Time  1145    PT Stop Time  1230    PT Time Calculation (min)  45 min    Activity Tolerance  Patient tolerated treatment well    Behavior During Therapy  [facility] for tasks assessed/performed       Past Medical History:  Diagnosis Date  . Diabetes mellitus without complication (HCC)   . Hypertension   . Pulmonary embolism (HCC)     History reviewed. No pertinent surgical history.  There were no vitals filed for this visit.  CLINIC OPERATION CHANGES: [facility] is operating at a low capacity due to COVID-19.  The patient was brought into the clinic for evaluation and/or treatment following universal masking by staff, social distancing, and <10 people in the clinic.  The patient's COVID risk of complications score is 6.  Subjective Assessment - 08/08/18 1141    Subjective  The patient presents s/p CVA 06/06/2018 d/c home with home health.  He had one fall when walking in the driveway noting he tripped on a rock.      Pertinent History  DM, HTN, HLD, PE and APL     Patient Stated Goals  The patient does not note any changes since stroke.      Currently in Pain?  No/denies         [facility] PT Assessment - 08/08/18 1150      Assessment   Medical Diagnosis  CVA/ gait disturbance    Referring Provider (PT)  Renford Dills, MD    Onset Date/Surgical Date  06/06/18    Hand Dominance  Right    Prior Therapy  acute and home health      Precautions   Precautions   Fall      Restrictions   Weight Bearing Restrictions  No      Balance Screen   Has the patient fallen in the past 6 months  Yes    How many times?  1    Has the patient had a decrease in activity level because of a fear of falling?   No    Is the patient reluctant to leave their home because of a fear of falling?   No      Home Environment   Living Environment  Private residence    Living Arrangements  Alone    Type of Home  House    Home Access  Stairs to enter    Entrance Stairs-Number of Steps  7-8    Entrance Stairs-Rails  Can reach both    Home Layout  One level    Home Equipment  None    Additional Comments  Son reports they stayed 24 hours/day until 2 weeks ago and now provide 24 hour supervision.        Prior Function   Level of Independence  Independent    Leisure  feeds animals on land      Sensation   Light Touch  Appears Intact      ROM / Strength   AROM / PROM / Strength  AROM;Strength      AROM   Overall AROM   Within functional limits for tasks performed      Strength   Overall Strength  Within functional limits for tasks performed      Ambulation/Gait   Ambulation/Gait  Yes    Ambulation/Gait Assistance  7: Independent    Ambulation Distance (Feet)  400 Feet    Assistive device  None    Gait Pattern  --   slowed pace   Ambulation Surface  Level;Unlevel;Outdoor    Gait velocity  2.55 ft/sec    Stairs  Yes    Stairs Assistance  5: Supervision;4: Airline pilot Details (indicate cue type and reason)  patient lost balance descending when attempting reciprocal pattern without rails; able to do with supervision with step to pattern descending and reciprocal pattern ascending    Number of Stairs  8    Curb  6: Modified independent (Device/increase time)    Gait Comments  Walked outdoors on unlevel grass, rubber mulch, curbs, to ensrue safe for walking to feed animals at home.  Patient perfomred at slow pace with some shortness of breath  noticed, but no loss of balance. spO2%=98% after walking.       Standardized Balance Assessment   Standardized Balance Assessment  Berg Balance Test      Berg Balance Test   Sit to Stand  Able to stand without using hands and stabilize independently    Standing Unsupported  Able to stand safely 2 minutes    Sitting with Back Unsupported but Feet Supported on Floor or Stool  Able to sit safely and securely 2 minutes    Stand to Sit  Sits safely with minimal use of hands    Transfers  Able to transfer safely, minor use of hands    Standing Unsupported with Eyes Closed  Able to stand 10 seconds safely    Standing Unsupported with Feet Together  Able to place feet together independently and stand 1 minute safely    From Standing, Reach Forward with Outstretched Arm  Can reach confidently >25 cm (10")    From Standing Position, Pick up Object from Floor  Able to pick up shoe safely and easily    From Standing Position, Turn to Look Behind Over each Shoulder  Looks behind from both sides and weight shifts well    Turn 360 Degrees  Able to turn 360 degrees safely but slowly    Standing Unsupported, Alternately Place Feet on Step/Stool  Able to stand independently and safely and complete 8 steps in 20 seconds    Standing Unsupported, One Foot in Front  Able to plae foot ahead of the other independently and hold 30 seconds    Standing on One Leg  Able to lift leg independently and hold equal to or more than 3 seconds    Total Score  51    Berg comment:  51/56                Objective measurements completed on examination: See above findings.      [facility] Adult PT Treatment/Exercise - 08/08/18 1150      Neuro Re-ed    Neuro Re-ed Details   single limb stance, tandem stance and endurance activiites provided for HEP.              PT  Education - 08/09/18 1402    Education Details  provided HEP for endurance and high level balance.    Person(s) Educated  Patient;Child(ren)     Methods  Explanation;Demonstration;Handout    Comprehension  Returned demonstration;Verbalized understanding          PT Long Term Goals - 08/09/18 1445      PT LONG TERM GOAL #1   Title  The patient will be indep with HEP for high level balance and endurance.  (Set LTG date for 6/26 due to changes in clinic operations for COVID)    Time  4    Period  Weeks    Target Date  10/07/18      PT LONG TERM GOAL #2   Title  The patient will improve Berg score from 51/56 to > or equal to 54/56 to demo improving high level balance control.    Time  4    Period  Weeks    Target Date  10/07/18      PT LONG TERM GOAL #3   Title  The patient will improve gait speed from 2.55 ft/sec to > or equal to 3.0  ft/sec to demo improving functional mobility.    Time  4    Period  Weeks    Target Date  10/07/18      PT LONG TERM GOAL #4   Title  The patient will negotiate 12 steps without a handrail independently without loss of balance.    Baseline  *patient has loss of balance descending steps without handrail-- needs steadying assist    Time  4    Period  Weeks    Target Date  10/07/18      PT LONG TERM GOAL #5   Title  The patient will increase ambulation to 10-15 minutes nonstop to demo improving endurance for community mobility.    Time  4    Period  Weeks    Target Date  10/07/18             Plan - 08/09/18 1457    Clinical Impression Statement  The patient is a 73 yo male s/p CVA 06/06/2018.  He underwent HH therapy and has progressed from constant supervision level to intermittent supervision level with son and daughter checking in regularly.  The patient is walking in his home independently at slowed pace and notes walking on his property independently.  At today's evaluation, he has some imbalance noted when descending steps, dec'd power during ambulation, and decreased high level balance and endurance.  Due to covid risk factors, do not want to bring the patient into the clinic  regularly, therefore recommend a home program (provided today) and one f/u in 4 weeks.      Personal Factors and Comorbidities  Age;Comorbidity 1;Comorbidity 2    Comorbidities  diabetes, hypertension, h/o PE    Examination-Activity Limitations  Locomotion Level;Stairs    Examination-Participation Restrictions  Community Activity;Driving    Stability/Clinical Decision Making  Stable/Uncomplicated    Clinical Decision Making  Low    Rehab Potential  Good    PT Frequency  1x / week    PT Duration  8 weeks   at a reduced rate x 60 days due to covid   PT Treatment/Interventions  ADLs/Self Care Home Management;Therapeutic activities;Therapeutic exercise;Balance training;Neuromuscular re-education;Patient/family education;Stair training;Functional mobility training    PT Next Visit Plan  check HEP, look at gait speed, endurance/walking program.  D/c if able based on goals.  Consulted and Agree with Plan of Care  Patient;Family member/caregiver    Family Member Consulted  son accompanies patient       Patient will benefit from skilled therapeutic intervention in order to improve the following deficits and impairments:  Abnormal gait, Decreased balance  Visit Diagnosis: Other abnormalities of gait and mobility  Unsteadiness on feet     Problem List Patient Active Problem List   Diagnosis Date Noted  . Cerebral infarction (HCC)   . CVA (cerebral vascular accident) (HCC) 06/06/2018  . Antiphospholipid syndrome (HCC) 06/06/2018  . Diabetes mellitus type 2, noninsulin dependent (HCC) 06/06/2018  . Hypercholesterolemia with hypertriglyceridemia 01/23/2016  . Impaired fasting glucose 01/23/2016  . B12 deficiency 01/23/2016  . Hypothyroid 01/23/2016  . Cognitive dysfunction 01/23/2016  . Pleuritic chest pain   . Leg DVT (deep venous thromboembolism), acute (HCC)   . Near syncope   . Elevated BP   . History of pulmonary embolus (PE)   . Pulmonary embolism (HCC) 12/24/2014     Merlean Pizzini, PT 08/09/2018, 3:03 PM  [facility] [address] [place], [place], [address] Phone: [phone]   Fax:  [phone]  Name: Arcangelo Allums MRN: [id number] Date of Birth: [birth year]

## 2018-09-02 ENCOUNTER — Ambulatory Visit: Payer: Medicare Other | Attending: Internal Medicine | Admitting: Rehabilitative and Restorative Service Providers"

## 2018-10-26 ENCOUNTER — Ambulatory Visit: Payer: Medicare Other | Admitting: Podiatry

## 2019-01-19 ENCOUNTER — Ambulatory Visit: Payer: Medicare Other | Admitting: Adult Health

## 2019-06-07 DIAGNOSIS — I639 Cerebral infarction, unspecified: Secondary | ICD-10-CM

## 2019-06-07 HISTORY — DX: Cerebral infarction, unspecified: I63.9

## 2019-07-11 ENCOUNTER — Ambulatory Visit: Payer: Medicare Other | Admitting: Podiatry

## 2019-07-11 ENCOUNTER — Encounter: Payer: Self-pay | Admitting: Podiatry

## 2019-07-11 ENCOUNTER — Other Ambulatory Visit: Payer: Self-pay

## 2019-07-11 VITALS — Temp 97.7°F

## 2019-07-11 DIAGNOSIS — B351 Tinea unguium: Secondary | ICD-10-CM | POA: Diagnosis not present

## 2019-07-11 DIAGNOSIS — Z9229 Personal history of other drug therapy: Secondary | ICD-10-CM | POA: Diagnosis not present

## 2019-07-11 DIAGNOSIS — E119 Type 2 diabetes mellitus without complications: Secondary | ICD-10-CM | POA: Diagnosis not present

## 2019-07-11 DIAGNOSIS — M79674 Pain in right toe(s): Secondary | ICD-10-CM | POA: Diagnosis not present

## 2019-07-11 DIAGNOSIS — M79675 Pain in left toe(s): Secondary | ICD-10-CM

## 2019-07-11 NOTE — Progress Notes (Addendum)
Subjective: Roger Ramirez presents today for preventative diabetic foot care, for diabetic foot evaluation and painful mycotic nails b/l that are difficult to trim. Pain interferes with ambulation. Aggravating factors include wearing enclosed shoe gear. Pain is relieved with periodic professional debridement.  His son is present during the visit. They voice no new pedal problems on today's visit.  Past Medical History:  Diagnosis Date  . Diabetes mellitus without complication ([place])   . Hypertension   . Pulmonary embolism [facility])     Patient Active Problem List   Diagnosis Date Noted  . Cerebral infarction ([place])   . CVA (cerebral vascular accident) ([place]) 06/06/2018  . Antiphospholipid syndrome ([place]) 06/06/2018  . Diabetes mellitus type 2, noninsulin dependent ([place]) 06/06/2018  . Hypercholesterolemia with hypertriglyceridemia 01/23/2016  . Impaired fasting glucose 01/23/2016  . B12 deficiency 01/23/2016  . Hypothyroid 01/23/2016  . Cognitive dysfunction 01/23/2016  . Pleuritic chest pain   . Leg DVT (deep venous thromboembolism), acute ([place])   . Near syncope   . Elevated BP   . History of pulmonary embolus (PE)   . Pulmonary embolism ([place]) 12/24/2014    History reviewed. No pertinent surgical history.  Current Outpatient Medications on File Prior to Visit  Medication Sig Dispense Refill  . ACCU-CHEK AVIVA PLUS test strip USE TO TEST BLOOD SUGAR QD    . Accu-Chek FastClix Lancets MISC USE TO CHECK BLOOD GLUCOSE UP TO QID UTD    . atorvastatin (LIPITOR) 10 MG tablet Take 10 mg by mouth daily.    . blood glucose meter kit and supplies KIT Dispense based on patient and insurance preference. Use up to four times daily as directed. (FOR ICD-9 250.00, 250.01). 1 each 0  . ibuprofen (ADVIL,MOTRIN) 800 MG tablet Take 800 mg by mouth every 8 (eight) hours as needed for moderate pain.     Marland Kitchen irbesartan (AVAPRO) 150 MG tablet Take 75 mg by mouth daily.    Marland Kitchen levothyroxine (SYNTHROID) 75 MCG  tablet Take 75 mcg by mouth daily.    Marland Kitchen levothyroxine (SYNTHROID, LEVOTHROID) 50 MCG tablet TK 1 T PO QD    . metFORMIN (GLUCOPHAGE) 1000 MG tablet Take 1,000 mg by mouth 2 (two) times daily.    . rivaroxaban (XARELTO) 20 MG TABS tablet Take 1 tablet (20 mg total) by mouth daily. 90 tablet 3  . vitamin B-12 1000 MCG tablet Take 1 tablet (1,000 mcg total) by mouth daily. 30 tablet 0   No current facility-administered medications on file prior to visit.     No Known Allergies  Social History   Occupational History  . Not on file  Tobacco Use  . Smoking status: Former Smoker    Packs/day: 1.00    Years: 20.00    Pack years: 20.00    Types: Cigarettes  . Smokeless tobacco: Never Used  . Tobacco comment: Quit 1970's  Substance and Sexual Activity  . Alcohol use: Yes    Alcohol/week: 0.0 standard drinks    Comment: Occasional beer / 1 every 2 or 3 weeks  . Drug use: No  . Sexual activity: Not on file    History reviewed. No pertinent family history.  Immunization History  Administered Date(s) Administered  . Influenza-Unspecified 01/07/2018  . Pneumococcal-Unspecified 11/12/2010, 11/22/2013  . Tdap 09/04/2015     Objective: Vitals:   07/11/19 0929  Temp: 97.7 F (36.5 C)    Roger Ramirez is a/an  74 y.o. Caucasian male WD, WN in NAD. AAO X 3.  Vascular  Examination: Capillary refill time to digits immediate b/l. Palpable DP pulses b/l. Palpable PT pulses b/l. Pedal hair absent b/l Skin temperature gradient within normal limits b/l.  Dermatological Examination: Pedal skin with normal turgor, texture and tone bilaterally. No open wounds bilaterally. No interdigital macerations bilaterally. Toenails 1-5 b/l elongated, dystrophic, thickened, crumbly with subungual debris and tenderness to dorsal palpation.   Incurvated nailplate left great toe medial border(s). No erythema, no edema, no drainage noted.Incurvated nailplate right great toe lateral border(s) with tenderness  to palpation. No erythema, no edema, no drainage noted.  Musculoskeletal Examination: Normal muscle strength 5/5 to all lower extremity muscle groups bilaterally, no gross bony deformities bilaterally and no pain crepitus or joint limitation noted with ROM b/l  Neurological Examination: Protective sensation intact 5/5 intact bilaterally with 10g monofilament b/l Vibratory sensation intact RLE;  diminished LLE.  Assessment: 1. Pain due to onychomycosis of toenails of both feet   2. Diabetes mellitus type 2, noninsulin dependent ([place])   3. Hx of long term use of blood thinners   4. Encounter for diabetic foot exam ([place])     Plan: -Diabetic foot examination performed on today's visit. -Continue diabetic foot care principles. Literature dispensed on today.  -Toenails 1-5 b/l were debrided in length and girth with sterile nail nippers and dremel without iatrogenic bleeding.  -Patient to continue soft, supportive shoe gear daily. Offending nail borders debrided and curretaged b/l great toes. Borders cleansed with alcohol. No further treatment required by patient. -Patient to report any pedal injuries to medical professional immediately. -Recommended 3 month follow up. Son will talk to his sister who schedule all of their Dad's medical appointments.  -Patient/POA to call should there be question/concern in the interim.  Return in about 3 months (around 10/11/2019) for diabetic nail trim.

## 2019-07-11 NOTE — Patient Instructions (Signed)
Diabetes Mellitus and Foot Care Foot care is an important part of your health, especially when you have diabetes. Diabetes may cause you to have problems because of poor blood flow (circulation) to your feet and legs, which can cause your skin to:  Become thinner and drier.  Break more easily.  Heal more slowly.  Peel and crack. You may also have nerve damage (neuropathy) in your legs and feet, causing decreased feeling in them. This means that you may not notice minor injuries to your feet that could lead to more serious problems. Noticing and addressing any potential problems early is the best way to prevent future foot problems. How to care for your feet Foot hygiene  Wash your feet daily with warm water and mild soap. Do not use hot water. Then, pat your feet and the areas between your toes until they are completely dry. Do not soak your feet as this can dry your skin.  Trim your toenails straight across. Do not dig under them or around the cuticle. File the edges of your nails with an emery board or nail file.  Apply a moisturizing lotion or petroleum jelly to the skin on your feet and to dry, brittle toenails. Use lotion that does not contain alcohol and is unscented. Do not apply lotion between your toes. Shoes and socks  Wear clean socks or stockings every day. Make sure they are not too tight. Do not wear knee-high stockings since they may decrease blood flow to your legs.  Wear shoes that fit properly and have enough cushioning. Always look in your shoes before you put them on to be sure there are no objects inside.  To break in new shoes, wear them for just a few hours a day. This prevents injuries on your feet. Wounds, scrapes, corns, and calluses  Check your feet daily for blisters, cuts, bruises, sores, and redness. If you cannot see the bottom of your feet, use a mirror or ask someone for help.  Do not cut corns or calluses or try to remove them with medicine.  If you  find a minor scrape, cut, or break in the skin on your feet, keep it and the skin around it clean and dry. You may clean these areas with mild soap and water. Do not clean the area with peroxide, alcohol, or iodine.  If you have a wound, scrape, corn, or callus on your foot, look at it several times a day to make sure it is healing and not infected. Check for: ? Redness, swelling, or pain. ? Fluid or blood. ? Warmth. ? Pus or a bad smell. General instructions  Do not cross your legs. This may decrease blood flow to your feet.  Do not use heating pads or hot water bottles on your feet. They may burn your skin. If you have lost feeling in your feet or legs, you may not know this is happening until it is too late.  Protect your feet from hot and cold by wearing shoes, such as at the beach or on hot pavement.  Schedule a complete foot exam at least once a year (annually) or more often if you have foot problems. If you have foot problems, report any cuts, sores, or bruises to your health care provider immediately. Contact a health care provider if:  You have a medical condition that increases your risk of infection and you have any cuts, sores, or bruises on your feet.  You have an injury that is not   healing.  You have redness on your legs or feet.  You feel burning or tingling in your legs or feet.  You have pain or cramps in your legs and feet.  Your legs or feet are numb.  Your feet always feel cold.  You have pain around a toenail. Get help right away if:  You have a wound, scrape, corn, or callus on your foot and: ? You have pain, swelling, or redness that gets worse. ? You have fluid or blood coming from the wound, scrape, corn, or callus. ? Your wound, scrape, corn, or callus feels warm to the touch. ? You have pus or a bad smell coming from the wound, scrape, corn, or callus. ? You have a fever. ? You have a red line going up your leg. Summary  Check your feet every day  for cuts, sores, red spots, swelling, and blisters.  Moisturize feet and legs daily.  Wear shoes that fit properly and have enough cushioning.  If you have foot problems, report any cuts, sores, or bruises to your health care provider immediately.  Schedule a complete foot exam at least once a year (annually) or more often if you have foot problems. This information is not intended to replace advice given to you by your health care provider. Make sure you discuss any questions you have with your health care provider. Document Revised: 12/21/2018 Document Reviewed: 05/01/2016 Elsevier Patient Education  2020 [employer]  Onychomycosis/Fungal Toenails  WHAT IS IT? An infection that lies within the keratin of your nail plate that is caused by a fungus.  WHY ME? Fungal infections affect all ages, sexes, races, and creeds.  There may be many factors that predispose you to a fungal infection such as age, coexisting medical conditions such as diabetes, or an autoimmune disease; stress, medications, fatigue, genetics, etc.  Bottom line: fungus thrives in a warm, moist environment and your shoes offer such a location.  IS IT CONTAGIOUS? Theoretically, yes.  You do not want to share shoes, nail clippers or files with someone who has fungal toenails.  Walking around barefoot in the same room or sleeping in the same bed is unlikely to transfer the organism.  It is important to realize, however, that fungus can spread easily from one nail to the next on the same foot.  HOW DO WE TREAT THIS?  There are several ways to treat this condition.  Treatment may depend on many factors such as age, medications, pregnancy, liver and kidney conditions, etc.  It is best to ask your doctor which options are available to you.  5. No treatment.   Unlike many other medical concerns, you can live with this condition.  However for many people this can be a painful condition and may lead to ingrown toenails or a bacterial  infection.  It is recommended that you keep the nails cut short to help reduce the amount of fungal nail. 6. Topical treatment.  These range from herbal remedies to prescription strength nail lacquers.  About 40-50% effective, topicals require twice daily application for approximately 9 to 12 months or until an entirely new nail has grown out.  The most effective topicals are medical grade medications available through physicians offices. 7. Oral antifungal medications.  With an 80-90% cure rate, the most common oral medication requires 3 to 4 months of therapy and stays in your system for a year as the new nail grows out.  Oral antifungal medications do require blood work to make   sure it is a safe drug for you.  A liver function panel will be performed prior to starting the medication and after the first month of treatment.  It is important to have the blood work performed to avoid any harmful side effects.  In general, this medication safe but blood work is required. 8. Laser Therapy.  This treatment is performed by applying a specialized laser to the affected nail plate.  This therapy is noninvasive, fast, and non-painful.  It is not covered by insurance and is therefore, out of pocket.  The results have been very good with a 80-95% cure rate.  The [facility] is the only practice in the area to offer this therapy. 9. Permanent Nail Avulsion.  Removing the entire nail so that a new nail will not grow back. 

## 2019-08-10 ENCOUNTER — Emergency Department (HOSPITAL_COMMUNITY): Payer: Medicare Other

## 2019-08-10 ENCOUNTER — Encounter (HOSPITAL_COMMUNITY): Payer: Self-pay | Admitting: Pharmacy Technician

## 2019-08-10 ENCOUNTER — Other Ambulatory Visit: Payer: Self-pay

## 2019-08-10 ENCOUNTER — Inpatient Hospital Stay (HOSPITAL_COMMUNITY): Payer: Medicare Other

## 2019-08-10 ENCOUNTER — Inpatient Hospital Stay (HOSPITAL_COMMUNITY)
Admission: EM | Admit: 2019-08-10 | Discharge: 2019-08-16 | DRG: 065 | Disposition: A | Discharge status: Rehabilitation Facility | Payer: Medicare Other | Attending: Internal Medicine | Admitting: Internal Medicine

## 2019-08-10 DIAGNOSIS — H9190 Unspecified hearing loss, unspecified ear: Secondary | ICD-10-CM | POA: Diagnosis present

## 2019-08-10 DIAGNOSIS — Z20822 Contact with and (suspected) exposure to covid-19: Secondary | ICD-10-CM | POA: Diagnosis present

## 2019-08-10 DIAGNOSIS — R471 Dysarthria and anarthria: Secondary | ICD-10-CM | POA: Diagnosis present

## 2019-08-10 DIAGNOSIS — I63019 Cerebral infarction due to thrombosis of unspecified vertebral artery: Secondary | ICD-10-CM | POA: Diagnosis not present

## 2019-08-10 DIAGNOSIS — E039 Hypothyroidism, unspecified: Secondary | ICD-10-CM | POA: Diagnosis present

## 2019-08-10 DIAGNOSIS — F09 Unspecified mental disorder due to known physiological condition: Secondary | ICD-10-CM | POA: Diagnosis not present

## 2019-08-10 DIAGNOSIS — I10 Essential (primary) hypertension: Secondary | ICD-10-CM | POA: Diagnosis present

## 2019-08-10 DIAGNOSIS — I350 Nonrheumatic aortic (valve) stenosis: Secondary | ICD-10-CM

## 2019-08-10 DIAGNOSIS — I63522 Cerebral infarction due to unspecified occlusion or stenosis of left anterior cerebral artery: Secondary | ICD-10-CM | POA: Diagnosis not present

## 2019-08-10 DIAGNOSIS — Z7982 Long term (current) use of aspirin: Secondary | ICD-10-CM

## 2019-08-10 DIAGNOSIS — Z720 Tobacco use: Secondary | ICD-10-CM

## 2019-08-10 DIAGNOSIS — G8191 Hemiplegia, unspecified affecting right dominant side: Secondary | ICD-10-CM | POA: Diagnosis present

## 2019-08-10 DIAGNOSIS — E7800 Pure hypercholesterolemia, unspecified: Secondary | ICD-10-CM | POA: Diagnosis not present

## 2019-08-10 DIAGNOSIS — E785 Hyperlipidemia, unspecified: Secondary | ICD-10-CM | POA: Diagnosis present

## 2019-08-10 DIAGNOSIS — F0151 Vascular dementia with behavioral disturbance: Secondary | ICD-10-CM | POA: Diagnosis not present

## 2019-08-10 DIAGNOSIS — Z86711 Personal history of pulmonary embolism: Secondary | ICD-10-CM

## 2019-08-10 DIAGNOSIS — E669 Obesity, unspecified: Secondary | ICD-10-CM | POA: Diagnosis present

## 2019-08-10 DIAGNOSIS — Z8673 Personal history of transient ischemic attack (TIA), and cerebral infarction without residual deficits: Secondary | ICD-10-CM | POA: Diagnosis not present

## 2019-08-10 DIAGNOSIS — F0390 Unspecified dementia without behavioral disturbance: Secondary | ICD-10-CM | POA: Diagnosis present

## 2019-08-10 DIAGNOSIS — Z66 Do not resuscitate: Secondary | ICD-10-CM | POA: Diagnosis present

## 2019-08-10 DIAGNOSIS — Z79899 Other long term (current) drug therapy: Secondary | ICD-10-CM | POA: Diagnosis not present

## 2019-08-10 DIAGNOSIS — I351 Nonrheumatic aortic (valve) insufficiency: Secondary | ICD-10-CM | POA: Diagnosis not present

## 2019-08-10 DIAGNOSIS — Z7984 Long term (current) use of oral hypoglycemic drugs: Secondary | ICD-10-CM

## 2019-08-10 DIAGNOSIS — I639 Cerebral infarction, unspecified: Secondary | ICD-10-CM | POA: Diagnosis present

## 2019-08-10 DIAGNOSIS — E782 Mixed hyperlipidemia: Secondary | ICD-10-CM

## 2019-08-10 DIAGNOSIS — F0150 Vascular dementia without behavioral disturbance: Secondary | ICD-10-CM | POA: Diagnosis not present

## 2019-08-10 DIAGNOSIS — R29810 Facial weakness: Secondary | ICD-10-CM | POA: Diagnosis present

## 2019-08-10 DIAGNOSIS — E781 Pure hyperglyceridemia: Secondary | ICD-10-CM | POA: Diagnosis present

## 2019-08-10 DIAGNOSIS — I213 ST elevation (STEMI) myocardial infarction of unspecified site: Secondary | ICD-10-CM | POA: Diagnosis not present

## 2019-08-10 DIAGNOSIS — Z6831 Body mass index (BMI) 31.0-31.9, adult: Secondary | ICD-10-CM

## 2019-08-10 DIAGNOSIS — Z7901 Long term (current) use of anticoagulants: Secondary | ICD-10-CM

## 2019-08-10 DIAGNOSIS — E119 Type 2 diabetes mellitus without complications: Secondary | ICD-10-CM

## 2019-08-10 DIAGNOSIS — I69322 Dysarthria following cerebral infarction: Secondary | ICD-10-CM | POA: Diagnosis not present

## 2019-08-10 DIAGNOSIS — R29708 NIHSS score 8: Secondary | ICD-10-CM | POA: Diagnosis present

## 2019-08-10 DIAGNOSIS — I63322 Cerebral infarction due to thrombosis of left anterior cerebral artery: Secondary | ICD-10-CM | POA: Diagnosis not present

## 2019-08-10 DIAGNOSIS — I69319 Unspecified symptoms and signs involving cognitive functions following cerebral infarction: Secondary | ICD-10-CM

## 2019-08-10 DIAGNOSIS — Z8249 Family history of ischemic heart disease and other diseases of the circulatory system: Secondary | ICD-10-CM

## 2019-08-10 DIAGNOSIS — D6861 Antiphospholipid syndrome: Secondary | ICD-10-CM | POA: Diagnosis present

## 2019-08-10 DIAGNOSIS — I69351 Hemiplegia and hemiparesis following cerebral infarction affecting right dominant side: Secondary | ICD-10-CM | POA: Diagnosis not present

## 2019-08-10 DIAGNOSIS — I2119 ST elevation (STEMI) myocardial infarction involving other coronary artery of inferior wall: Secondary | ICD-10-CM | POA: Diagnosis not present

## 2019-08-10 DIAGNOSIS — E538 Deficiency of other specified B group vitamins: Secondary | ICD-10-CM | POA: Diagnosis present

## 2019-08-10 DIAGNOSIS — I6389 Other cerebral infarction: Secondary | ICD-10-CM | POA: Diagnosis not present

## 2019-08-10 DIAGNOSIS — Z823 Family history of stroke: Secondary | ICD-10-CM

## 2019-08-10 DIAGNOSIS — Z87891 Personal history of nicotine dependence: Secondary | ICD-10-CM

## 2019-08-10 HISTORY — DX: Antiphospholipid syndrome: D68.61

## 2019-08-10 LAB — COMPREHENSIVE METABOLIC PANEL
ALT: 19 U/L (ref 0–44)
AST: 24 U/L (ref 15–41)
Albumin: 4.1 g/dL (ref 3.5–5.0)
Alkaline Phosphatase: 51 U/L (ref 38–126)
Anion gap: 11 (ref 5–15)
BUN: 12 mg/dL (ref 8–23)
CO2: 20 mmol/L — ABNORMAL LOW (ref 22–32)
Calcium: 9.4 mg/dL (ref 8.9–10.3)
Chloride: 105 mmol/L (ref 98–111)
Creatinine, Ser: 0.96 mg/dL (ref 0.61–1.24)
GFR calc Af Amer: >60 mL/min (ref >60)
GFR calc non Af Amer: >60 mL/min (ref >60)
Glucose, Bld: 139 mg/dL — ABNORMAL HIGH (ref 70–99)
Potassium: 4.3 mmol/L (ref 3.5–5.1)
Sodium: 136 mmol/L (ref 135–145)
Total Bilirubin: 0.8 mg/dL (ref 0.3–1.2)
Total Protein: 7.4 g/dL (ref 6.5–8.1)

## 2019-08-10 LAB — CBC
HCT: 45.4 % (ref 39.0–52.0)
Hemoglobin: 14.8 g/dL (ref 13.0–17.0)
MCH: 29.7 pg (ref 26.0–34.0)
MCHC: 32.6 g/dL (ref 30.0–36.0)
MCV: 91.2 fL (ref 80.0–100.0)
Platelets: 197 10*3/uL (ref 150–400)
RBC: 4.98 MIL/uL (ref 4.22–5.81)
RDW: 13.6 % (ref 11.5–15.5)
WBC: 10.9 10*3/uL — ABNORMAL HIGH (ref 4.0–10.5)
nRBC: 0 % (ref 0.0–0.2)

## 2019-08-10 LAB — RESPIRATORY PANEL BY RT PCR (FLU A&B, COVID)
Influenza A by PCR: NEGATIVE
Influenza B by PCR: NEGATIVE
SARS Coronavirus 2 by RT PCR: NEGATIVE

## 2019-08-10 LAB — DIFFERENTIAL
Abs Immature Granulocytes: 0.14 10*3/uL — ABNORMAL HIGH (ref 0.00–0.07)
Basophils Absolute: 0.1 10*3/uL (ref 0.0–0.1)
Basophils Relative: 1 %
Eosinophils Absolute: 0.1 10*3/uL (ref 0.0–0.5)
Eosinophils Relative: 1 %
Immature Granulocytes: 1 %
Lymphocytes Relative: 8 %
Lymphs Abs: 0.8 10*3/uL (ref 0.7–4.0)
Monocytes Absolute: 0.7 10*3/uL (ref 0.1–1.0)
Monocytes Relative: 6 %
Neutro Abs: 9.2 10*3/uL — ABNORMAL HIGH (ref 1.7–7.7)
Neutrophils Relative %: 83 %

## 2019-08-10 LAB — I-STAT CHEM 8, ED
BUN: 15 mg/dL (ref 8–23)
Calcium, Ion: 1.19 mmol/L (ref 1.15–1.40)
Chloride: 104 mmol/L (ref 98–111)
Creatinine, Ser: 0.7 mg/dL (ref 0.61–1.24)
Glucose, Bld: 133 mg/dL — ABNORMAL HIGH (ref 70–99)
HCT: 45 % (ref 39.0–52.0)
Hemoglobin: 15.3 g/dL (ref 13.0–17.0)
Potassium: 4.5 mmol/L (ref 3.5–5.1)
Sodium: 137 mmol/L (ref 135–145)
TCO2: 24 mmol/L (ref 22–32)

## 2019-08-10 LAB — PROTIME-INR
INR: 1.4 — ABNORMAL HIGH (ref 0.8–1.2)
Prothrombin Time: 16.2 seconds — ABNORMAL HIGH (ref 11.4–15.2)

## 2019-08-10 LAB — APTT: aPTT: 33 seconds (ref 24–36)

## 2019-08-10 LAB — GLUCOSE, CAPILLARY
Glucose-Capillary: 107 mg/dL — ABNORMAL HIGH (ref 70–99)
Glucose-Capillary: 113 mg/dL — ABNORMAL HIGH (ref 70–99)

## 2019-08-10 LAB — ECHOCARDIOGRAM COMPLETE
Height: 69 in
Weight: 3439.18 oz

## 2019-08-10 LAB — HEMOGLOBIN A1C
Hgb A1c MFr Bld: 6.1 % — ABNORMAL HIGH (ref 4.8–5.6)
Mean Plasma Glucose: 128.37 mg/dL

## 2019-08-10 LAB — TSH: TSH: 1.117 u[IU]/mL (ref 0.350–4.500)

## 2019-08-10 MED ORDER — POLYETHYLENE GLYCOL 3350 17 G PO PACK: 17.0000 g | PACK | Freq: Every day | ORAL | Status: DC | PRN

## 2019-08-10 MED ORDER — STROKE: EARLY STAGES OF RECOVERY BOOK
Freq: Once | Status: AC
  Filled 2019-08-10 (×2): qty 1

## 2019-08-10 MED ORDER — ACETAMINOPHEN 650 MG RE SUPP: 650.0000 mg | Freq: Four times a day (QID) | RECTAL | Status: DC | PRN

## 2019-08-10 MED ORDER — IOHEXOL 350 MG/ML SOLN
75.0000 mL | Freq: Once | INTRAVENOUS | Status: AC | PRN
  Administered 2019-08-10: 75 mL via INTRAVENOUS

## 2019-08-10 MED ORDER — DOCUSATE SODIUM 100 MG PO CAPS
100.0000 mg | ORAL_CAPSULE | Freq: Two times a day (BID) | ORAL | Status: DC
  Administered 2019-08-10 – 2019-08-16 (×12): 100 mg via ORAL
  Filled 2019-08-10 (×12): qty 1

## 2019-08-10 MED ORDER — ZOLPIDEM TARTRATE 5 MG PO TABS
5.0000 mg | ORAL_TABLET | Freq: Every evening | ORAL | Status: DC | PRN
  Administered 2019-08-10 – 2019-08-11 (×2): 5 mg via ORAL
  Filled 2019-08-10 (×2): qty 1

## 2019-08-10 MED ORDER — ATORVASTATIN CALCIUM 40 MG PO TABS
40.0000 mg | ORAL_TABLET | Freq: Every day | ORAL | Status: DC
  Administered 2019-08-10 – 2019-08-16 (×7): 40 mg via ORAL
  Filled 2019-08-10 (×7): qty 1

## 2019-08-10 MED ORDER — INSULIN ASPART 100 UNIT/ML ~~LOC~~ SOLN: 0.0000 [IU] | Freq: Every day | SUBCUTANEOUS | Status: DC

## 2019-08-10 MED ORDER — ASPIRIN EC 81 MG PO TBEC
81.0000 mg | DELAYED_RELEASE_TABLET | Freq: Every day | ORAL | Status: DC
  Administered 2019-08-11 – 2019-08-12 (×2): 81 mg via ORAL
  Filled 2019-08-10 (×2): qty 1

## 2019-08-10 MED ORDER — SODIUM CHLORIDE 0.9% FLUSH: 3.0000 mL | Freq: Once | INTRAVENOUS | Status: DC

## 2019-08-10 MED ORDER — LEVOTHYROXINE SODIUM 75 MCG PO TABS
75.0000 ug | ORAL_TABLET | Freq: Every day | ORAL | Status: DC
  Administered 2019-08-11 – 2019-08-16 (×5): 75 ug via ORAL
  Filled 2019-08-10 (×6): qty 1

## 2019-08-10 MED ORDER — SODIUM CHLORIDE 0.9 % IV SOLN: INTRAVENOUS | Status: DC

## 2019-08-10 MED ORDER — BISACODYL 5 MG PO TBEC
5.0000 mg | DELAYED_RELEASE_TABLET | Freq: Every day | ORAL | Status: DC | PRN
  Filled 2019-08-10: qty 1

## 2019-08-10 MED ORDER — ONDANSETRON HCL 4 MG/2ML IJ SOLN: 4.0000 mg | Freq: Four times a day (QID) | INTRAMUSCULAR | Status: DC | PRN

## 2019-08-10 MED ORDER — RIVAROXABAN 20 MG PO TABS
20.0000 mg | ORAL_TABLET | Freq: Every day | ORAL | Status: DC
  Administered 2019-08-10 – 2019-08-12 (×3): 20 mg via ORAL
  Filled 2019-08-10 (×4): qty 1

## 2019-08-10 MED ORDER — HYDROCODONE-ACETAMINOPHEN 5-325 MG PO TABS
1.0000 | ORAL_TABLET | ORAL | Status: DC | PRN
  Filled 2019-08-10: qty 1

## 2019-08-10 MED ORDER — ONDANSETRON HCL 4 MG PO TABS: 4.0000 mg | ORAL_TABLET | Freq: Four times a day (QID) | ORAL | Status: DC | PRN

## 2019-08-10 MED ORDER — ACETAMINOPHEN 325 MG PO TABS: 650.0000 mg | ORAL_TABLET | Freq: Four times a day (QID) | ORAL | Status: DC | PRN

## 2019-08-10 MED ORDER — INSULIN ASPART 100 UNIT/ML ~~LOC~~ SOLN
0.0000 [IU] | Freq: Three times a day (TID) | SUBCUTANEOUS | Status: DC
  Administered 2019-08-11: 2 [IU] via SUBCUTANEOUS
  Administered 2019-08-12: 3 [IU] via SUBCUTANEOUS
  Administered 2019-08-12 – 2019-08-14 (×2): 2 [IU] via SUBCUTANEOUS
  Administered 2019-08-14: 3 [IU] via SUBCUTANEOUS
  Administered 2019-08-15 (×2): 2 [IU] via SUBCUTANEOUS
  Administered 2019-08-16: 3 [IU] via SUBCUTANEOUS

## 2019-08-10 MED ORDER — MORPHINE SULFATE (PF) 2 MG/ML IV SOLN: 2.0000 mg | INTRAVENOUS | Status: DC | PRN

## 2019-08-10 NOTE — Progress Notes (Signed)
  Echocardiogram 2D Echocardiogram has been performed.  Roger Ramirez 08/10/2019, 5:09 PM

## 2019-08-10 NOTE — ED Provider Notes (Signed)
[facility] EMERGENCY DEPARTMENT Provider Note   CSN: [id number] Arrival date & time: 08/10/19  9242  An emergency department physician performed an initial assessment on this suspected stroke patient at 0924.  History Chief Complaint  Patient presents with   Code Stroke    Roger Ramirez is a 74 y.o. male.  The history is provided by the patient, the EMS personnel and medical records. No language interpreter was used.   Roger Ramirez is a 74 y.o. male who presents to the Emergency Department complaining of code stroke. Level V caveat due to confusion. He presents the emergency department by [facility] as a code stroke for slurred speech and confusion. He was last known to be normal at 830 last night. Today he was noted to have slurred speech, right-sided weakness. He does have a history of pulmonary embolism and takes a Xarelto. Patient denies any complaints.     Past Medical History:  Diagnosis Date   Antiphospholipid syndrome ([place])    CVA (cerebral vascular accident) ([place]) 06/07/2019   Diabetes mellitus without complication ([place])    Hypertension    Pulmonary embolism [facility])     Patient Active Problem List   Diagnosis Date Noted   Cerebral infarction [facility])    CVA (cerebral vascular accident) ([place]) 06/06/2018   Antiphospholipid syndrome ([place]) 06/06/2018   Diabetes mellitus type 2, noninsulin dependent ([place]) 06/06/2018   Hypercholesterolemia with hypertriglyceridemia 01/23/2016   Impaired fasting glucose 01/23/2016   B12 deficiency 01/23/2016   Hypothyroid 01/23/2016   Cognitive dysfunction 01/23/2016   Pleuritic chest pain    Leg DVT (deep venous thromboembolism), acute (HCC)    Near syncope    Elevated BP    History of pulmonary embolus (PE)    Pulmonary embolism ([place]) 12/24/2014    History reviewed. No pertinent surgical history.     Family History  Problem Relation Age of Onset   Hypertension Mother     Hypertension Father    Dementia Maternal Grandmother    Stroke Maternal Grandmother     Social History   Tobacco Use   Smoking status: Former Smoker    Packs/day: 1.00    Years: 20.00    Pack years: 20.00    Types: Cigarettes   Smokeless tobacco: Never Used   Tobacco comment: Quit 1970's  Substance Use Topics   Alcohol use: Not Currently    Alcohol/week: 0.0 standard drinks    Comment: Occasional beer / 1 every 2 or 3 weeks   Drug use: No    Home Medications Prior to Admission medications   Medication Sig Start Date End Date Taking? Authorizing Provider  ACCU-CHEK AVIVA PLUS test strip 1 each by Other route daily.  07/11/18  Yes [provider]  Accu-Chek FastClix Lancets MISC 1 each by Other route in the morning, at noon, in the evening, and at bedtime.  06/07/18  Yes [provider]  aspirin EC 81 MG tablet Take 81 mg by mouth daily.   Yes [provider]  atorvastatin (LIPITOR) 10 MG tablet Take 10 mg by mouth daily. 06/06/18  Yes [provider]  blood glucose meter kit and supplies KIT Dispense based on patient and insurance preference. Use up to four times daily as directed. (FOR ICD-9 250.00, 250.01). Patient taking differently: 1 each by Other route See admin instructions. Dispense based on patient and insurance preference. Use up to four times daily as directed. (FOR ICD-9 250.00, 250.01). 06/07/18  Yes Florencia Reasons, MD  irbesartan (AVAPRO) 150 MG tablet Take 75 mg by mouth daily. 03/29/18  Yes [provider]  levothyroxine (SYNTHROID) 75 MCG tablet Take 75 mcg by mouth daily. 05/21/19  Yes [provider]  metFORMIN (GLUCOPHAGE) 1000 MG tablet Take 1,000 mg by mouth 2 (two) times daily. 06/06/18  Yes [provider]  Omega-3 Fatty Acids (FISH OIL) 1200 MG CAPS Take 1,200 mg by mouth daily.   Yes [provider]  rivaroxaban (XARELTO) 20 MG TABS tablet Take 1 tablet (20 mg total) by mouth daily. 05/11/17   Yes Nicholas Lose, MD  vitamin B-12 1000 MCG tablet Take 1 tablet (1,000 mcg total) by mouth daily. 06/08/18  Yes Florencia Reasons, MD    Allergies    Patient has no known allergies.  Review of Systems   Review of Systems  All other systems reviewed and are negative.   Physical Exam Updated Vital Signs BP (!) 174/77    Pulse 61    Temp 98 F (36.7 C) (Oral)    Resp (!) 21    Ht 5' 9"  (1.753 m)    Wt 97.5 kg    SpO2 98%    BMI 31.74 kg/m   Physical Exam Vitals and nursing note reviewed.  Constitutional:      Appearance: He is well-developed.  HENT:     Head: Normocephalic and atraumatic.  Cardiovascular:     Rate and Rhythm: Normal rate and regular rhythm.     Heart sounds: No murmur.  Pulmonary:     Effort: Pulmonary effort is normal. No respiratory distress.     Breath sounds: Normal breath sounds.  Abdominal:     Palpations: Abdomen is soft.     Tenderness: There is no abdominal tenderness. There is no guarding or rebound.  Musculoskeletal:        General: No tenderness.  Skin:    General: Skin is warm and dry.  Neurological:     Mental Status: He is alert.     Comments: Awake and alert.  Mildly confused.  Dysarthric speech.  4/5 strength in RUE. 3/5 strength in the right lower extremity. Visual fields are grossly intact. Right-sided pronator drift.  Psychiatric:        Behavior: Behavior normal.     ED Results / Procedures / Treatments   Labs (all labs ordered are listed, but only abnormal results are displayed) Labs Reviewed  PROTIME-INR - Abnormal; Notable for the following components:      Result Value   Prothrombin Time 16.2 (*)    INR 1.4 (*)    All other components within normal limits  CBC - Abnormal; Notable for the following components:   WBC 10.9 (*)    All other components within normal limits  DIFFERENTIAL - Abnormal; Notable for the following components:   Neutro Abs 9.2 (*)    Abs Immature Granulocytes 0.14 (*)    All other components within normal  limits  COMPREHENSIVE METABOLIC PANEL - Abnormal; Notable for the following components:   CO2 20 (*)    Glucose, Bld 139 (*)    All other components within normal limits  I-STAT CHEM 8, ED - Abnormal; Notable for the following components:   Glucose, Bld 133 (*)    All other components within normal limits  RESPIRATORY PANEL BY RT PCR (FLU A&B, COVID)  APTT  HEMOGLOBIN A1C  TSH  CBG MONITORING, ED    EKG None  Radiology CT ANGIO HEAD W OR WO CONTRAST  Result  Date: 08/10/2019 CLINICAL DATA:  Code stroke follow-up, right facial droop and leg weakness EXAM: CT ANGIOGRAPHY HEAD AND NECK TECHNIQUE: Multidetector CT imaging of the head and neck was performed using the standard protocol during bolus administration of intravenous contrast. Multiplanar CT image reconstructions and MIPs were obtained to evaluate the vascular anatomy. Carotid stenosis measurements (when applicable) are obtained utilizing NASCET criteria, using the distal internal carotid diameter as the denominator. CONTRAST:  54m OMNIPAQUE IOHEXOL 350 MG/ML SOLN COMPARISON:  None. FINDINGS: CTA NECK Aortic arch: Calcified plaque along the arch and patent great vessel origins. Common origin of the innominate and left common carotid arteries. There is irregular noncalcified plaque along the left subclavian origin with mild stenosis. Right carotid system: Patent. Primarily calcified plaque along the proximal ICA causing less than 50% stenosis. Left carotid system: Patent. Atherosclerotic wall thickening along the common carotid. Primarily calcified plaque along the proximal ICA causing less than 50% stenosis. Vertebral arteries: Patent. Skeleton: Degenerative changes of the cervical spine. There is partial fusion of the C4 and C5 vertebral bodies. Other neck: No mass or adenopathy. Upper chest: No apical lung mass. Review of the MIP images confirms the above findings CTA HEAD Anterior circulation: Intracranial internal carotid arteries are  patent with calcified plaque causing mild to moderate stenosis. Middle cerebral arteries are patent with mild atherosclerotic irregularity more distally. Right anterior cerebral artery is patent. The proximal left anterior cerebral artery is patent. There is atherosclerotic irregularity of the left A3 ACA with high-grade stenosis and probable subsequent occlusion. Posterior circulation: Intracranial vertebral arteries, basilar artery, and posterior cerebral arteries are patent. Small posterior communicating arteries are present. There is short segment high-grade stenosis of the proximal left P2 PCA. Venous sinuses: As permitted by contrast timing, patent. Review of the MIP images confirms the above findings IMPRESSION: Atherosclerotic irregularity of left A3 ACA with high-grade stenosis and suspected subsequent occlusion. Short segment high-grade stenosis proximal left P2 PCA. Calcified plaque at the ICA origins causing less than 50% stenosis. Initial is results were discussed by telephone at the time of interpretation on 08/10/2019 at 9:54 am to provider Dr. ALorraine Lax who verbally acknowledged these results. Electronically Signed   By: PMacy MisM.D.   On: 08/10/2019 10:07   CT ANGIO NECK W OR WO CONTRAST  Result Date: 08/10/2019 CLINICAL DATA:  Code stroke follow-up, right facial droop and leg weakness EXAM: CT ANGIOGRAPHY HEAD AND NECK TECHNIQUE: Multidetector CT imaging of the head and neck was performed using the standard protocol during bolus administration of intravenous contrast. Multiplanar CT image reconstructions and MIPs were obtained to evaluate the vascular anatomy. Carotid stenosis measurements (when applicable) are obtained utilizing NASCET criteria, using the distal internal carotid diameter as the denominator. CONTRAST:  720mOMNIPAQUE IOHEXOL 350 MG/ML SOLN COMPARISON:  None. FINDINGS: CTA NECK Aortic arch: Calcified plaque along the arch and patent great vessel origins. Common origin of the  innominate and left common carotid arteries. There is irregular noncalcified plaque along the left subclavian origin with mild stenosis. Right carotid system: Patent. Primarily calcified plaque along the proximal ICA causing less than 50% stenosis. Left carotid system: Patent. Atherosclerotic wall thickening along the common carotid. Primarily calcified plaque along the proximal ICA causing less than 50% stenosis. Vertebral arteries: Patent. Skeleton: Degenerative changes of the cervical spine. There is partial fusion of the C4 and C5 vertebral bodies. Other neck: No mass or adenopathy. Upper chest: No apical lung mass. Review of the MIP images confirms the above findings CTA  HEAD Anterior circulation: Intracranial internal carotid arteries are patent with calcified plaque causing mild to moderate stenosis. Middle cerebral arteries are patent with mild atherosclerotic irregularity more distally. Right anterior cerebral artery is patent. The proximal left anterior cerebral artery is patent. There is atherosclerotic irregularity of the left A3 ACA with high-grade stenosis and probable subsequent occlusion. Posterior circulation: Intracranial vertebral arteries, basilar artery, and posterior cerebral arteries are patent. Small posterior communicating arteries are present. There is short segment high-grade stenosis of the proximal left P2 PCA. Venous sinuses: As permitted by contrast timing, patent. Review of the MIP images confirms the above findings IMPRESSION: Atherosclerotic irregularity of left A3 ACA with high-grade stenosis and suspected subsequent occlusion. Short segment high-grade stenosis proximal left P2 PCA. Calcified plaque at the ICA origins causing less than 50% stenosis. Initial is results were discussed by telephone at the time of interpretation on 08/10/2019 at 9:54 am to provider Dr. Lorraine Lax, who verbally acknowledged these results. Electronically Signed   By: Macy Mis M.D.   On: 08/10/2019 10:07     MR BRAIN WO CONTRAST  Result Date: 08/10/2019 CLINICAL DATA:  Right facial droop, slurred speech and confusion today. Negative acute CT evaluation. EXAM: MRI HEAD WITHOUT CONTRAST TECHNIQUE: Multiplanar, multiecho pulse sequences of the brain and surrounding structures were obtained without intravenous contrast. COMPARISON:  CT studies earlier same day.  MRI 06/06/2018. FINDINGS: Brain: Brain atrophy with some temporal lobe predominance. Diffusion imaging shows acute infarction in the left body of the corpus callosum. No other acute infarction. Elsewhere, there are moderate chronic small-vessel ischemic changes of the hemispheric white matter. No mass lesion, hemorrhage, hydrocephalus or extra-axial collection. Vascular: Major vessels at the base of the brain show flow. Skull and upper cervical spine: Negative Sinuses/Orbits: Clear/normal Other: None IMPRESSION: Acute infarction affecting the left body of the corpus callosum. Brain atrophy with temporal lobe predominance. Chronic small-vessel ischemic changes affecting the cerebral hemispheric white matter. Electronically Signed   By: Nelson Chimes M.D.   On: 08/10/2019 10:57   CT HEAD CODE STROKE WO CONTRAST  Result Date: 08/10/2019 CLINICAL DATA:  Code stroke. Right-sided facial droop and right leg weakness EXAM: CT HEAD WITHOUT CONTRAST TECHNIQUE: Contiguous axial images were obtained from the base of the skull through the vertex without intravenous contrast. COMPARISON:  06/06/2018 brain MRI FINDINGS: Brain: No evidence of acute infarction, hemorrhage, hydrocephalus, extra-axial collection or mass lesion/mass effect. Atrophy that is severe in the temporal lobes. Chronic small vessel ischemia with confluent low-density in the deep cerebral white matter and remote lacunar infarct at the right caudate head. Vascular: Atherosclerotic calcification.  No hyperdense vessel Skull: Normal. Negative for fracture or focal lesion. Sinuses/Orbits: Negative Other:  These results were communicated to Dr. Lorraine Lax at 9:34 amon 4/29/2021by text page via the [facility] messaging system. ASPECTS [facility] Stroke Program Early CT Score) - Ganglionic level infarction (caudate, lentiform nuclei, internal capsule, insula, M1-M3 cortex): 7 - Supraganglionic infarction (M4-M6 cortex): 3 Total score (0-10 with 10 being normal): 10 IMPRESSION: 1. No acute finding. 2. Severe temporal lobe atrophy. 3. Chronic small vessel ischemia. Electronically Signed   By: Monte Fantasia M.D.   On: 08/10/2019 09:35    Procedures Procedures (including critical care time)  Medications Ordered in ED Medications  atorvastatin (LIPITOR) tablet 40 mg (has no administration in time range)  levothyroxine (SYNTHROID) tablet 75 mcg (has no administration in time range)  rivaroxaban (XARELTO) tablet 20 mg (has no administration in time range)  acetaminophen (TYLENOL) tablet 650 mg (  has no administration in time range)    Or  acetaminophen (TYLENOL) suppository 650 mg (has no administration in time range)  HYDROcodone-acetaminophen (NORCO/VICODIN) 5-325 MG per tablet 1-2 tablet (has no administration in time range)  morphine 2 MG/ML injection 2 mg (has no administration in time range)  zolpidem (AMBIEN) tablet 5 mg (has no administration in time range)  docusate sodium (COLACE) capsule 100 mg (has no administration in time range)  polyethylene glycol (MIRALAX / GLYCOLAX) packet 17 g (has no administration in time range)  bisacodyl (DULCOLAX) EC tablet 5 mg (has no administration in time range)  ondansetron (ZOFRAN) tablet 4 mg (has no administration in time range)    Or  ondansetron (ZOFRAN) injection 4 mg (has no administration in time range)  insulin aspart (novoLOG) injection 0-15 Units (has no administration in time range)   stroke: mapping our early stages of recovery book (has no administration in time range)  0.9 %  sodium chloride infusion (has no administration in time range)  insulin  aspart (novoLOG) injection 0-5 Units (has no administration in time range)  aspirin EC tablet 81 mg (has no administration in time range)  iohexol (OMNIPAQUE) 350 MG/ML injection 75 mL (75 mLs Intravenous Contrast Given 08/10/19 0936)    ED Course  I have reviewed the triage vital signs and the nursing notes.  Pertinent labs & imaging results that were available during my care of the patient were reviewed by me and considered in my medical decision making (see chart for details).    MDM Rules/Calculators/A&P                     patient presents to the emergency department as a code stroke with right-sided weakness, confusion. He does have weakness on examination. He is not a TPA candidate due to his anticoagulation and duration of symptoms. He has been evaluated by Neurology and found not to be an IR candidate.  Hospitalist consulted for admission for further treatment.   Final Clinical Impression(s) / ED Diagnoses Final diagnoses:  Acute ischemic stroke [facility])    Rx / [place] Orders ED Discharge Orders    None       Quintella Reichert, MD 08/10/19 1536

## 2019-08-10 NOTE — Consult Note (Addendum)
Neurology Consultation  Reason for Consult: Code stroke Referring Physician: Marin Roberts  CC: Right leg weakness, slurred speech and confusion.  History is obtained from: EMS  HPI: Roger Ramirez is a 74 y.o. 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  Patient was brought to [facility] as a code stroke.  He was last seen normal at 8:30 PM on 08/10/2019.  This morning he was found to have confusion and slurred speech along with some right leg weakness.  On EMS arrival patient's blood glucose was 154, blood pressure 148/82.  On arrival patient was slow to answer, did have difficulty lifting his right leg.  CT head was obtained which did not show any acute infarct.  As noted below patient was not a TPA candidate for 2 reasons, he was out of the window and he is also on Xarelto.  ED course  Relevant labs include -WBC 10.9, CT head shows-no acute findings.  Severe temporal lobe atrophy.  Chronic small vessel ischemia.  Chart review (patient was recently seen on 06/06/2018 as a code stroke for slurred speech and left facial droop.  Patient at that time was currently on Xarelto for his pulmonary embolism.  On exam he was happened to have a mild right facial droop.  MRI showed a left CR small infarct, MRA showed unremarkable, carotid Dopplers were unremarkable, 2D echo showed an EF of 50-55%, LDL at the time was 56, HB A1c was 8.4.  He was found to be B12 deficient with a level of 86.)  LKW: 2030 on 08/09/2019 tpa given?: no, out of window and on Xarelto Premorbid modified Rankin scale (mRS): 0 NIH stroke score: 8   Past Medical History:  Diagnosis Date  . Diabetes mellitus without complication ([place])   . Hypertension   . Pulmonary embolism ([facility])     Family History  Problem Relation Age of Onset  . Hypertension Mother   . Hypertension Father    Social History:   reports that he has quit smoking. His smoking  use included cigarettes. He has a 20.00 pack-year smoking history. He has never used smokeless tobacco. He reports current alcohol use. He reports that he does not use drugs.  Medications  Current Facility-Administered Medications:  .  sodium chloride flush (NS) 0.9 % injection 3 mL, 3 mL, Intravenous, Once, Quintella Reichert, MD  Current Outpatient Medications:  .  ACCU-CHEK AVIVA PLUS test strip, USE TO TEST BLOOD SUGAR QD, Disp: , Rfl:  .  Accu-Chek FastClix Lancets MISC, USE TO CHECK BLOOD GLUCOSE UP TO QID UTD, Disp: , Rfl:  .  atorvastatin (LIPITOR) 10 MG tablet, Take 10 mg by mouth daily., Disp: , Rfl:  .  blood glucose meter kit and supplies KIT, Dispense based on patient and insurance preference. Use up to four times daily as directed. (FOR ICD-9 250.00, 250.01)., Disp: 1 each, Rfl: 0 .  ibuprofen (ADVIL,MOTRIN) 800 MG tablet, Take 800 mg by mouth every 8 (eight) hours as needed for moderate pain. , Disp: , Rfl:  .  irbesartan (AVAPRO) 150 MG tablet, Take 75 mg by mouth daily., Disp: , Rfl:  .  levothyroxine (SYNTHROID) 75 MCG tablet, Take 75 mcg by mouth daily., Disp: , Rfl:  .  levothyroxine (SYNTHROID, LEVOTHROID) 50 MCG tablet, TK 1 T PO QD, Disp: , Rfl:  .  metFORMIN (GLUCOPHAGE) 1000 MG tablet, Take 1,000 mg by mouth 2 (two) times daily., Disp: , Rfl:  .  rivaroxaban (XARELTO) 20 MG TABS tablet, Take 1 tablet (20 mg total) by mouth daily., Disp: 90 tablet, Rfl: 3 .  vitamin B-12 1000 MCG tablet, Take 1 tablet (1,000 mcg total) by mouth daily., Disp: 30 tablet, Rfl: 0  ROS:   Unable to obtain due to altered mental status.   Exam: Current vital signs: BP (!) 146/82   Pulse 81   Temp 98 F (36.7 C) (Oral)   Resp 16  Vital signs in last 24 hours: Temp:  [98 F (36.7 C)] 98 F (36.7 C) (04/29 0942) Pulse Rate:  [81] 81 (04/29 0942) Resp:  [16] 16 (04/29 0942) BP: (146)/(82) 146/82 (04/29 5625)   Constitutional: Appears well-developed and well-nourished.  Psych: Affect  appropriate to situation Eyes: No scleral injection HENT: No OP obstrucion Head: Normocephalic.  Cardiovascular: Normal rate and regular rhythm.  Respiratory: Effort normal, non-labored breathing GI: Soft.  No distension. There is no tenderness.  Skin: WDI  Neuro: Mental Status: Patient is awake, alert, place, month, year, Speech- naming, repeating. Comprehension Able to follow commands, speech is dysarthric but not aphasic. Cranial Nerves: II: Visual Fields are full.  III,IV, VI: EOMI without ptosis or diploplia. Pupils equal, round and reactive to light V: Facial sensation is symmetric to temperature VII: Facial movement is symmetric.  VIII: Decreased hearing which is baseline X: Palat elevates symmetrically XI: Shoulder shrug is symmetric. XII: tongue is midline without atrophy or fasciculations.  Motor: Tone is normal. Bulk is normal.  4/5 strength bilateral upper extremities and left lower extremity Positive drift right lower extremity Sensory: Sensation is symmetric to light touch and temperature in the arms and legs. Double simultaneous stimulation intact Deep Tendon Reflexes: 2+ and symmetric in the biceps and patellae.  Plantars: Toes are downgoing bilaterally.  Cerebellar: FNF intact with no dysmetria  Labs I have reviewed labs in epic and the results pertinent to this consultation are:   CBC    Component Value Date/Time   WBC 10.9 (H) 08/10/2019 0926   RBC 4.98 08/10/2019 0926   HGB 15.3 08/10/2019 0927   HCT 45.0 08/10/2019 0927   PLT 197 08/10/2019 0926   MCV 91.2 08/10/2019 0926   MCH 29.7 08/10/2019 0926   MCHC 32.6 08/10/2019 0926   RDW 13.6 08/10/2019 0926   LYMPHSABS 0.8 08/10/2019 0926   MONOABS 0.7 08/10/2019 0926   EOSABS 0.1 08/10/2019 0926   BASOSABS 0.1 08/10/2019 0926    CMP     Component Value Date/Time   NA 137 08/10/2019 0927   K 4.5 08/10/2019 0927   CL 104 08/10/2019 0927   CO2 22 06/06/2018 1030   GLUCOSE 133 (H)  08/10/2019 0927   BUN 15 08/10/2019 0927   CREATININE 0.70 08/10/2019 0927   CALCIUM 9.5 06/06/2018 1030   PROT 7.1 06/06/2018 1030   ALBUMIN 3.8 06/06/2018 1030   AST 34 06/06/2018 1030   ALT 27 06/06/2018 1030   ALKPHOS 67 06/06/2018 1030   BILITOT 0.6 06/06/2018 1030   GFRNONAA >60 06/06/2018 1030   GFRAA >60 06/06/2018 1030    Lipid Panel     Component Value Date/Time   CHOL 152 06/07/2018 0443   TRIG 289 (H) 06/07/2018 0443   HDL 38 (L) 06/07/2018 0443   CHOLHDL 4.0 06/07/2018 0443   VLDL 58 (H) 06/07/2018 0443   LDLCALC 56 06/07/2018 0443     Imaging I have reviewed the images obtained:  CT-scan of the brain-no acute findings.  CTA of head and neck-atherosclerotic  irregular changes of the left A3 ACA with high-grade stenosis and suspected subsequent occlusion.  Short segment high-grade stenosis of the proximal left P2 PCA.  Classified plaque at the ICA origin causing less than 50% stenosis.  MRI examination of the brain: left ACA stroke   Etta Quill PA-C [facility] [phone]  M-F  (9:00 am- 5:00 PM)  08/10/2019, 9:52 AM   NEUROHOSPITALIST ADDENDUM Performed a face to face diagnostic evaluation.   I have reviewed the contents of history and physical exam as documented by PA/ARNP/Resident and agree with above documentation.  I have discussed and formulated the above plan as documented. Edits to the note have been made as needed.  74 year old male with history of PE on Xarelto, antiphospholipid syndrome, hypertension, diabetes, stroke in 2020 presents to the emergency department-last known normal was actually 8:30 AM and not 8.30 pm as stated by EMS.  Patient woke up today and he was on the bedroom floor, EMS was called and patient noted to have right-sided weakness more prominently right leg weakness.  On assessment, no visual neglect.  He was able to answer questions-slightly incorrect on his age status 11 instead of 28.  Told month was going to  be being, later clarified that it was April.  Stat CT head was obtained showed no hemorrhage, CTA showed left P2 occlusion versus stenosis with distal reconstitution as well as A3 occlusion.  An MRI was obtained-showed frontal lobe/corpus callosum stroke corresponding to ACA branch occlusion  Left ACA territory acute infarction  Etiology: Likely cardioembolic, however could be intracranial atherosclerotic disease as well  Recommendations #Transthoracic Echo  #Consider switching Xarelto to Eliquis, continue ASA. Would not recommend triple therapy with DAPT and Xarelto.  #Start or continue Atorvastatin 18m daily  # BP goal: permissive HTN upto 185/110 mmHg # HBAIC and Lipid profile # Telemetry monitoring # Frequent neuro checks # Stroke swallow screen  Please page stroke NP  Or  PA  Or MD from 8am -4 pm  as this patient from this time will be  followed by the stroke.   You can look them up on [email or web address]  [facility]     SKarena AddisonAroor MD [facility] [phone]  If 7pm to 7am, please call on call as listed on AMION.

## 2019-08-10 NOTE — Progress Notes (Signed)
Patient arrived to unit, verified on telemetry 

## 2019-08-10 NOTE — H&P (Signed)
History and Physical    Roger Ramirez HUD:[id number] DOB: 1945-05-19 DOA: 08/10/2019  PCP: Seward Carol, MD Consultants:  Elisha Ponder - podiatry Patient coming from:  Home - lives alone; NOK: Daughter, [place], [phone]  Chief Complaint: Code Stroke  HPI: Roger Ramirez is a 74 y.o. male with medical history significant of HTN; antiphospholipid syndrome with h/o PE on Xarelto; CVA (05/2018); and DM presenting as a Code Stroke.  He doesn't know what happened or why he is here.  His daughter reports that she came to his house and found him on the floor.  He doesn't remember how he got into the floor.  His daughter goes by to make meals TID daily - he seemed fine by telephone but they didn't actually see him yesterday.  Recently he has seemed better and he was fine Tuesday night.  His daughter went in this AM and he was in the bedroom floor.  He as leaning against the side of the bed.  He did not press his Life Alert.  He was disoriented and confused, didn't know why he was in the floor and wasn't able to help lift himself.  This is c/w last stroke in February.  She did not notice facial droop at home but his speech seemed worse and he had left facial droop in the ER.  He was slow to respond and oriented x 2.  +urinary and fecal incontinence recently, particularly at night in the last few weeks.    At baseline, he lives alone.  Family visits daily or every other day and prepares meals.  No driving.  He does not reheat food.  He goes up and down stairs and walks around his property to feed deer.  Some impaired judgment, decreased reaction time and processing time.  Understands communication.  +dementia.  He takes his medicine every day, but has not had it today.      ED Course:  Code stroke, not a TPA candidate.  Neurology has seen and recommends admission for CVA work-up.  Not a candidate for IR despite stenosis on MR.  Review of Systems: As per HPI; otherwise review of systems reviewed and negative.     Ambulatory Status:  Ambulates without assistance  COVID Vaccine Status:  First shot   Past Medical History:  Diagnosis Date  . Antiphospholipid syndrome ([place])   . CVA (cerebral vascular accident) ([place]) 06/07/2019  . Diabetes mellitus without complication ([place])   . Hypertension   . Pulmonary embolism ([place])     History reviewed. No pertinent surgical history.  Social History   Socioeconomic History  . Marital status: Divorced    Spouse name: Not on file  . Number of children: Not on file  . Years of education: Not on file  . Highest education level: Not on file  Occupational History  . Occupation: retired  Tobacco Use  . Smoking status: Former Smoker    Packs/day: 1.00    Years: 20.00    Pack years: 20.00    Types: Cigarettes  . Smokeless tobacco: Never Used  . Tobacco comment: Quit 1970's  Substance and Sexual Activity  . Alcohol use: Not Currently    Alcohol/week: 0.0 standard drinks    Comment: Occasional beer / 1 every 2 or 3 weeks  . Drug use: No  . Sexual activity: Not on file  Other Topics Concern  . 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 Difficulty of Paying Living Expenses:   Food Insecurity:   . Worried About Charity fundraiser in the Last Year:   . Arboriculturist in the Last Year:   Transportation Needs:   . Film/video editor (Medical):   Marland Kitchen Lack of Transportation (Non-Medical):   Physical Activity:   . Days of Exercise per Week:   . Minutes of Exercise per Session:   Stress:   . Feeling of Stress :   Social Connections:   . Frequency of Communication with Friends and Family:   . Frequency of Social Gatherings with Friends and Family:   . Attends Religious Services:   . Active Member of Clubs or Organizations:   . Attends Archivist Meetings:   Marland Kitchen  Marital Status:   Intimate Partner Violence:   . Fear of Current or Ex-Partner:   . Emotionally Abused:   Marland Kitchen Physically Abused:   . Sexually Abused:     No Known Allergies  Family History  Problem Relation Age of Onset  . Hypertension Mother   . Hypertension Father   . Dementia Maternal Grandmother   . Stroke Maternal Grandmother     Prior to Admission medications   Medication Sig Start Date End Date Taking? Authorizing Provider  ACCU-CHEK AVIVA PLUS test strip USE TO TEST BLOOD SUGAR QD 07/11/18   [provider]  Accu-Chek FastClix Lancets MISC USE TO CHECK BLOOD GLUCOSE UP TO QID UTD 06/07/18   [provider]  atorvastatin (LIPITOR) 10 MG tablet Take 10 mg by mouth daily. 06/06/18   [provider]  blood glucose meter kit and supplies KIT Dispense based on patient and insurance preference. Use up to four times daily as directed. (FOR ICD-9 250.00, 250.01). 06/07/18   Florencia Reasons, MD  ibuprofen (ADVIL,MOTRIN) 800 MG tablet Take 800 mg by mouth every 8 (eight) hours as needed for moderate pain.     [provider]  irbesartan (AVAPRO) 150 MG tablet Take 75 mg by mouth daily. 03/29/18   [provider]  levothyroxine (SYNTHROID) 75 MCG tablet Take 75 mcg by mouth daily. 05/21/19   [provider]  levothyroxine (SYNTHROID, LEVOTHROID) 50 MCG tablet TK 1 T PO QD 06/06/18   [provider]  metFORMIN (GLUCOPHAGE) 1000 MG tablet Take 1,000 mg by mouth 2 (two) times daily. 06/06/18   [provider]  rivaroxaban (XARELTO) 20 MG TABS tablet Take 1 tablet (20 mg total) by mouth daily. 05/11/17   Nicholas Lose, MD  vitamin B-12 1000 MCG tablet Take 1 tablet (1,000 mcg total) by mouth daily. 06/08/18   Florencia Reasons, MD    Physical Exam: Vitals:   08/10/19 1145 08/10/19 1153 08/10/19 1200 08/10/19 1230  BP: 140/71  (!) 162/78 (!) 174/77  Pulse: 63  65 61  Resp: (!) 21  (!) 22 (!) 21  Temp:      TempSrc:      SpO2: 97%  97% 98%   Weight:  97.5 kg    Height:  5' 9"  (1.753 m)       . General:  Appears calm and comfortable and is NAD; inattentive to events around him, oriented x 1 . Eyes:  PERRL, EOMI, normal lids, iris . ENT:  Hard of hearing, grossly normal lips & tongue, mmm . Neck:  no LAD, masses or thyromegaly . Cardiovascular:  RRR, no  m/r/g. No LE edema.  Marland Kitchen Respiratory:   CTA bilaterally with no wheezes/rales/rhonchi.  Normal respiratory effort. . Abdomen:  soft, NT, ND, NABS . Skin:  no rash or induration seen on limited exam . Musculoskeletal:  4-5/5 strength RUE and RLE extremities, mildly decreased R grip strength . Lower extremity:  No LE edema.  Limited foot exam with no ulcerations.  2+ distal pulses. Marland Kitchen Psychiatric:  remote mood and affect, speech slow but somewhat appropriate, AOx1 . Neurologic:  Left facial droop, reports symmetric sensation    Radiological Exams on Admission: CT ANGIO HEAD W OR WO CONTRAST  Result Date: 08/10/2019 CLINICAL DATA:  Code stroke follow-up, right facial droop and leg weakness EXAM: CT ANGIOGRAPHY HEAD AND NECK TECHNIQUE: Multidetector CT imaging of the head and neck was performed using the standard protocol during bolus administration of intravenous contrast. Multiplanar CT image reconstructions and MIPs were obtained to evaluate the vascular anatomy. Carotid stenosis measurements (when applicable) are obtained utilizing NASCET criteria, using the distal internal carotid diameter as the denominator. CONTRAST:  75m OMNIPAQUE IOHEXOL 350 MG/ML SOLN COMPARISON:  None. FINDINGS: CTA NECK Aortic arch: Calcified plaque along the arch and patent great vessel origins. Common origin of the innominate and left common carotid arteries. There is irregular noncalcified plaque along the left subclavian origin with mild stenosis. Right carotid system: Patent. Primarily calcified plaque along the proximal ICA causing less than 50% stenosis. Left carotid system: Patent. Atherosclerotic  wall thickening along the common carotid. Primarily calcified plaque along the proximal ICA causing less than 50% stenosis. Vertebral arteries: Patent. Skeleton: Degenerative changes of the cervical spine. There is partial fusion of the C4 and C5 vertebral bodies. Other neck: No mass or adenopathy. Upper chest: No apical lung mass. Review of the MIP images confirms the above findings CTA HEAD Anterior circulation: Intracranial internal carotid arteries are patent with calcified plaque causing mild to moderate stenosis. Middle cerebral arteries are patent with mild atherosclerotic irregularity more distally. Right anterior cerebral artery is patent. The proximal left anterior cerebral artery is patent. There is atherosclerotic irregularity of the left A3 ACA with high-grade stenosis and probable subsequent occlusion. Posterior circulation: Intracranial vertebral arteries, basilar artery, and posterior cerebral arteries are patent. Small posterior communicating arteries are present. There is short segment high-grade stenosis of the proximal left P2 PCA. Venous sinuses: As permitted by contrast timing, patent. Review of the MIP images confirms the above findings IMPRESSION: Atherosclerotic irregularity of left A3 ACA with high-grade stenosis and suspected subsequent occlusion. Short segment high-grade stenosis proximal left P2 PCA. Calcified plaque at the ICA origins causing less than 50% stenosis. Initial is results were discussed by telephone at the time of interpretation on 08/10/2019 at 9:54 am to provider Dr. ALorraine Lax who verbally acknowledged these results. Electronically Signed   By: PMacy MisM.D.   On: 08/10/2019 10:07   CT ANGIO NECK W OR WO CONTRAST  Result Date: 08/10/2019 CLINICAL DATA:  Code stroke follow-up, right facial droop and leg weakness EXAM: CT ANGIOGRAPHY HEAD AND NECK TECHNIQUE: Multidetector CT imaging of the head and neck was performed using the standard protocol during bolus  administration of intravenous contrast. Multiplanar CT image reconstructions and MIPs were obtained to evaluate the vascular anatomy. Carotid stenosis measurements (when applicable) are obtained utilizing NASCET criteria, using the distal internal carotid diameter as the denominator. CONTRAST:  760mOMNIPAQUE IOHEXOL 350 MG/ML SOLN COMPARISON:  None. FINDINGS: CTA NECK Aortic arch: Calcified plaque along the arch and patent great vessel  origins. Common origin of the innominate and left common carotid arteries. There is irregular noncalcified plaque along the left subclavian origin with mild stenosis. Right carotid system: Patent. Primarily calcified plaque along the proximal ICA causing less than 50% stenosis. Left carotid system: Patent. Atherosclerotic wall thickening along the common carotid. Primarily calcified plaque along the proximal ICA causing less than 50% stenosis. Vertebral arteries: Patent. Skeleton: Degenerative changes of the cervical spine. There is partial fusion of the C4 and C5 vertebral bodies. Other neck: No mass or adenopathy. Upper chest: No apical lung mass. Review of the MIP images confirms the above findings CTA HEAD Anterior circulation: Intracranial internal carotid arteries are patent with calcified plaque causing mild to moderate stenosis. Middle cerebral arteries are patent with mild atherosclerotic irregularity more distally. Right anterior cerebral artery is patent. The proximal left anterior cerebral artery is patent. There is atherosclerotic irregularity of the left A3 ACA with high-grade stenosis and probable subsequent occlusion. Posterior circulation: Intracranial vertebral arteries, basilar artery, and posterior cerebral arteries are patent. Small posterior communicating arteries are present. There is short segment high-grade stenosis of the proximal left P2 PCA. Venous sinuses: As permitted by contrast timing, patent. Review of the MIP images confirms the above findings  IMPRESSION: Atherosclerotic irregularity of left A3 ACA with high-grade stenosis and suspected subsequent occlusion. Short segment high-grade stenosis proximal left P2 PCA. Calcified plaque at the ICA origins causing less than 50% stenosis. Initial is results were discussed by telephone at the time of interpretation on 08/10/2019 at 9:54 am to provider Dr. Lorraine Lax, who verbally acknowledged these results. Electronically Signed   By: Macy Mis M.D.   On: 08/10/2019 10:07   MR BRAIN WO CONTRAST  Result Date: 08/10/2019 CLINICAL DATA:  Right facial droop, slurred speech and confusion today. Negative acute CT evaluation. EXAM: MRI HEAD WITHOUT CONTRAST TECHNIQUE: Multiplanar, multiecho pulse sequences of the brain and surrounding structures were obtained without intravenous contrast. COMPARISON:  CT studies earlier same day.  MRI 06/06/2018. FINDINGS: Brain: Brain atrophy with some temporal lobe predominance. Diffusion imaging shows acute infarction in the left body of the corpus callosum. No other acute infarction. Elsewhere, there are moderate chronic small-vessel ischemic changes of the hemispheric white matter. No mass lesion, hemorrhage, hydrocephalus or extra-axial collection. Vascular: Major vessels at the base of the brain show flow. Skull and upper cervical spine: Negative Sinuses/Orbits: Clear/normal Other: None IMPRESSION: Acute infarction affecting the left body of the corpus callosum. Brain atrophy with temporal lobe predominance. Chronic small-vessel ischemic changes affecting the cerebral hemispheric white matter. Electronically Signed   By: Nelson Chimes M.D.   On: 08/10/2019 10:57   CT HEAD CODE STROKE WO CONTRAST  Result Date: 08/10/2019 CLINICAL DATA:  Code stroke. Right-sided facial droop and right leg weakness EXAM: CT HEAD WITHOUT CONTRAST TECHNIQUE: Contiguous axial images were obtained from the base of the skull through the vertex without intravenous contrast. COMPARISON:  06/06/2018  brain MRI FINDINGS: Brain: No evidence of acute infarction, hemorrhage, hydrocephalus, extra-axial collection or mass lesion/mass effect. Atrophy that is severe in the temporal lobes. Chronic small vessel ischemia with confluent low-density in the deep cerebral white matter and remote lacunar infarct at the right caudate head. Vascular: Atherosclerotic calcification.  No hyperdense vessel Skull: Normal. Negative for fracture or focal lesion. Sinuses/Orbits: Negative Other: These results were communicated to Dr. Lorraine Lax at 9:34 amon 4/29/2021by text page via the [facility] messaging system. ASPECTS [facility] Stroke Program Early CT Score) - Ganglionic level infarction (caudate, lentiform nuclei, internal  capsule, insula, M1-M3 cortex): 7 - Supraganglionic infarction (M4-M6 cortex): 3 Total score (0-10 with 10 being normal): 10 IMPRESSION: 1. No acute finding. 2. Severe temporal lobe atrophy. 3. Chronic small vessel ischemia. Electronically Signed   By: Monte Fantasia M.D.   On: 08/10/2019 09:35    EKG: pending   Labs on Admission: I have personally reviewed the available labs and imaging studies at the time of the admission.  Pertinent labs:   Glucose 139 WBC 10.9 INR 1.4 Respiratory panel PCR negative   Assessment/Plan Principal Problem:   CVA (cerebral vascular accident) ([place]) Active Problems:   Hypercholesterolemia with hypertriglyceridemia   Hypothyroid   Cognitive dysfunction   Antiphospholipid syndrome (HCC)   Diabetes mellitus type 2, noninsulin dependent (HCC)   Obesity (BMI 30.0-34.9)   CVA -Patient with similar presentation to 05/2019 CVA - fall/collapse with confusion -Concerning for TIA/CVA -Negative CT but CTA and MRI are c/w CVA with high grade stenosis -He is not a candidate for TPA and does not appear to be a neurointerventional candidate either -Will admit for further CVA evaluation -Telemetry monitoring -Echo -Risk stratification with FLP, A1c; will also check TSH  -He  has been ASA daily in addition to Xarelto and so this appears to be ASA failure; he appears likely to benefit from DAPT and transition to Plavix -Neurology consult -PT/OT/ST/Nutrition Consults -CIR consult for placement  HTN -Allow permissive HTN for now -Treat BP only if >220/120, and then with goal of 15% reduction -Hold ARB and plan to restart in 48-72 hours   HLD -Check FLP -Resume statin but will increase Lipitor empirically from 40m to 40 mg daily -Hold fish oil due to limited inpatient utility   DM -Recent A1c shows suboptimal control; will recheck -Hold home Glucophage -Will order moderate-scale SSI  Antiphospholipid syndrome -Patient with h/o saddle PE -Remains on Xarelto  Hypothyroidism -Check TSH -Continue Synthroid at current dose for now  Cognitive impairment -Appears to have mild baseline cognitive impairment from prior CVA -Now with what appears to be worsened cognition -It is not clear how much this will improve during hospitalization -He currently lives independently and that may be problematic moving forward -He appears likely to benefit from CIR if he qualifies; will place CIR consult  Obesity -Body mass index is 31.74 kg/m. -Weight loss should be encouraged   Note: This patient has been tested and is negative for the novel coronavirus COVID-19.      DVT prophylaxis:  Xarelto Code Status: DNR - confirmed with patient/family Family Communication: Daughter present throughout evaluation Disposition Plan:  The patient is from: home  Anticipated d/c is to: CIR vs. SNF rehab; he appears currently unlikely to be able to return home independently.  Anticipated d/c date will depend on clinical response to treatment, likely 2-3 days  Patient is currently: acutely ill Consults called: Neurology; CIR; PT/OT/ST/Nutrition  Admission status: Admit - It is my clinical opinion [place] because of the expectation  that this patient will require hospital care that crosses at least 2 midnights to treat this condition based on the medical complexity of the problems presented.  Given the aforementioned information, the predictability of an adverse outcome is felt to be significant.     JKarmen BongoMD [facility]   How to contact the [facility] HospitalAttending or Consulting provider [place] covering provider during after hours [place] for this patient?  1. Check the care team in [place] look  for a) attending/consulting [place] provider listed and b) the [facility] team listed 2. Log into [email or web address] and use [place]'s universal password to access. If you do not have the password, please contact the hospital operator. 3. Locate the [facility] provider you are looking for under [facility] and page to a number that you can be directly reached. 4. If you still have difficulty reaching the provider, please page the [facility] (Director on Call) for the Hospitalists listed on amion for assistance.   08/10/2019, 3:35 PM

## 2019-08-10 NOTE — ED Triage Notes (Signed)
Pt bib ems as a code stroke. LKW 4/28 at 2030. Pt found today with R side facial droop, slurred speech and confusion. VSS with ems.

## 2019-08-10 NOTE — Code Documentation (Signed)
74 yo male coming from home where he lives by himself. Pt has daughter come over to his house a couple days a week to help with meals. Pt was seen on Tuesday at 0800 at his baseline. Yesterday, 4/28 patient was talked to on the phone at 0830 with no reports of slurred speech and at his baseline. This morning, daughter went over to patient's house and noted patient was on the ground beside his bed. Pt had taken night medications, but did not take morning medications. Pt reported that his legs just gave out on him and he was not able to get back up.   EMS was called and activated a Code Stroke based on LKW at 0830. Reported that the patient had speech changes and leg weakness. Stroke Team met patient upon arrival to the ED. Initial NIHSS 8 - unable to answer questions, bilateral leg weakness R>L, slight dysarthria and aphasia. CT Head/CTA completed.   Pt taken back to the room and placed on cardiac monitor. CTA showed P2 occlusion per MD and patient taken to MRI to confirm stroke core and penumbra. Taken to MRI at 0945. MRI complete and not a candidate for IR.   Handoff given to [employer], Therapist, sports. Plan: q2 hour mNIHSS and VS. Patient to be admitted and stroke work up completed.

## 2019-08-10 NOTE — ED Notes (Signed)
ECHO at bedside.

## 2019-08-11 DIAGNOSIS — Z8673 Personal history of transient ischemic attack (TIA), and cerebral infarction without residual deficits: Secondary | ICD-10-CM

## 2019-08-11 DIAGNOSIS — I10 Essential (primary) hypertension: Secondary | ICD-10-CM

## 2019-08-11 DIAGNOSIS — I639 Cerebral infarction, unspecified: Secondary | ICD-10-CM | POA: Diagnosis not present

## 2019-08-11 DIAGNOSIS — Z86711 Personal history of pulmonary embolism: Secondary | ICD-10-CM

## 2019-08-11 DIAGNOSIS — Z720 Tobacco use: Secondary | ICD-10-CM

## 2019-08-11 DIAGNOSIS — I63019 Cerebral infarction due to thrombosis of unspecified vertebral artery: Secondary | ICD-10-CM | POA: Diagnosis not present

## 2019-08-11 DIAGNOSIS — E119 Type 2 diabetes mellitus without complications: Secondary | ICD-10-CM | POA: Diagnosis not present

## 2019-08-11 DIAGNOSIS — F09 Unspecified mental disorder due to known physiological condition: Secondary | ICD-10-CM

## 2019-08-11 LAB — GLUCOSE, CAPILLARY
Glucose-Capillary: 121 mg/dL — ABNORMAL HIGH (ref 70–99)
Glucose-Capillary: 110 mg/dL — ABNORMAL HIGH (ref 70–99)
Glucose-Capillary: 100 mg/dL — ABNORMAL HIGH (ref 70–99)
Glucose-Capillary: 123 mg/dL — ABNORMAL HIGH (ref 70–99)

## 2019-08-11 LAB — LIPID PANEL
Cholesterol: 120 mg/dL (ref 0–200)
HDL: 35 mg/dL — ABNORMAL LOW (ref >40)
LDL Cholesterol: 57 mg/dL (ref 0–99)
Total CHOL/HDL Ratio: 3.4 RATIO
Triglycerides: 139 mg/dL (ref <150)
VLDL: 28 mg/dL (ref 0–40)

## 2019-08-11 MED ORDER — ADULT MULTIVITAMIN W/MINERALS CH
1.0000 | ORAL_TABLET | Freq: Every day | ORAL | Status: DC
  Administered 2019-08-11 – 2019-08-16 (×6): 1 via ORAL
  Filled 2019-08-11 (×6): qty 1

## 2019-08-11 MED ORDER — ENSURE ENLIVE PO LIQD
237.0000 mL | Freq: Two times a day (BID) | ORAL | Status: DC
  Administered 2019-08-11 – 2019-08-16 (×10): 237 mL via ORAL

## 2019-08-11 NOTE — Progress Notes (Signed)
Pt restless, would benefit from having a sitter. Paged Dr. Jerral Ralph, received order for sitter

## 2019-08-11 NOTE — Progress Notes (Signed)
Initial Nutrition Assessment  **RD working remotely**  DOCUMENTATION CODES:   Obesity unspecified  INTERVENTION:  Ensure Enlive po BID, each supplement provides 350 kcal and 20 grams of protein  MVI daily  NUTRITION DIAGNOSIS:   Inadequate oral intake related to lethargy/confusion as evidenced by other (comment)(per RN report).   GOAL:   Patient will meet greater than or equal to 90% of their needs   MONITOR:   PO intake, Supplement acceptance, Labs, Weight trends, I & O's  REASON FOR ASSESSMENT:   Consult Other (Comment)(CVA)  ASSESSMENT:   Pt with a PMH significant for HTN, antiphospholipid syndrome with h/o PE, CVA, and DM was found down and presented as Code Stroke. Pt admitted with CVA.  RD unable to reach pt via phone.  Discussed pt with RN who reports pt has been eating about 50% of his meals. No PO intake is documented.   Per H&P, pt's daughter brings him meals TID daily.   Reviewed wt hx. Wt stable over last year.   UOP: x24 hours I/O: -96.63ml since admit  Labs reviewed: CBGs 107-121 Medications reviewed and include: colace, Novolog  NUTRITION - FOCUSED PHYSICAL EXAM:  RD unable to perform at this time; working remotely.    Diet Order:   Diet Order            Diet heart healthy/carb modified Room service appropriate? Yes; Fluid consistency: Thin  Diet effective ____              EDUCATION NEEDS:   Not appropriate for education at this time  Skin:  Skin Assessment: Reviewed RN Assessment  Last BM:  08/09/19  Height:   Ht Readings from Last 1 Encounters:  08/10/19 5\' 9"  (1.753 m)    Weight:  Wt Readings from Last 5 Encounters:  08/10/19 97.5 kg  07/19/18 97.5 kg  06/06/18 99.8 kg  05/11/17 106.9 kg  05/12/16 106.2 kg    BMI:  Body mass index is 31.74 kg/m.  Estimated Nutritional Needs:   Kcal:  1800-2000  Protein:  90-100 grams  Fluid:  >/= 1.8L/d   05/14/16, MS, RD, LDN RD pager number and  weekend/on-call pager number located in Amion.

## 2019-08-11 NOTE — Evaluation (Signed)
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 MRI of the brain revealed left ACA territory stroke   Clinical Impression   Pt admitted with the above diagnoses and presents with below problem list. Pt will benefit from continued acute OT to address the below listed deficits and maximize independence with basic ADLs prior to d/c to venue below. PTA pt was independent with ADLs. He lives alone but has family living nearby. Pt presents with decreased activity tolerance, impaired sitting balance (posterior lean), impaired proprioception and motor planning impacting ADLs and bed mobility. Pt sat EOB 1 minute with max A before fatiguing and initiating return to supine. Pt is A&O to place, person, and year but presents with cognitive deficits detailed below including decreased awareness. Pt's son and daughter present during session.      Follow Up Recommendations  CIR    Equipment Recommendations  Other (comment)(defer to next venue)    Recommendations for Other Services PT consult     Precautions / Restrictions Precautions Precautions: Fall Precaution Comments: heavy posterior lean sitting EOB Required Braces or Orthoses: (hand mitts) Restrictions Weight Bearing Restrictions: No      Mobility Bed Mobility Overal bed mobility: Needs Assistance Bed Mobility: Sit to Supine;Supine to Sit     Supine to sit: Max assist;HOB elevated Sit to supine: Max assist;+2 for physical assistance   General bed mobility comments: Max A to powerup trunk and pivot hips to EOB position. Max +2 A to return to supine with pt fatigued after sitting EOB about a minute  Transfers                 General transfer  comment: unable to attempt this session    Balance Overall balance assessment: Needs assistance Sitting-balance support: Bilateral upper extremity supported;Feet supported Sitting balance-Leahy Scale: Poor Sitting balance - Comments: poor static EOB balance. heavy posterior lean. fatigued to zero sitting balance.                                    ADL either performed or assessed with clinical judgement   ADL Overall ADL's : Needs assistance/impaired Eating/Feeding: Minimal assistance;Bed level   Grooming: Moderate assistance;Bed level   Upper Body Bathing: Maximal assistance;Bed level   Lower Body Bathing: Maximal assistance;+2 for physical assistance;Bed level   Upper Body Dressing : Moderate assistance;Bed level   Lower Body Dressing: Maximal assistance;+2 for physical assistance;Bed level                 General ADL Comments: Pt completed bed mobility and sat EOB about 1 minute. Fatigued quickly initiating return to supine. Heavy posterior lean sitting EOB needing mod to max A.      Vision Baseline Vision/History: Wears glasses Wears Glasses: Reading only       Perception     Praxis Praxis Praxis tested?: Deficits Deficits: Motor Impersistence;Organization    Pertinent Vitals/Pain Pain Assessment: Faces Faces Pain Scale: Hurts a little bit Pain Location: grimacing with bed mobility Pain Descriptors / Indicators: Grimacing Pain Intervention(s): Limited activity within patient's tolerance;Monitored during session;Repositioned     Hand Dominance Right   Extremity/Trunk Assessment Upper Extremity Assessment Upper Extremity Assessment: Generalized  weakness;RUE deficits/detail;Difficult to assess due to impaired cognition;LUE deficits/detail RUE Deficits / Details: grossly 4/5. decreased proprioception and motor planning. sensation appears intact RUE Sensation: decreased proprioception RUE Coordination: decreased fine motor;decreased gross  motor LUE Deficits / Details: strength WFL. decreased propriception and motor planning   Lower Extremity Assessment Lower Extremity Assessment: Defer to PT evaluation       Communication Communication Communication: HOH;Other (comment)(dysarthria)   Cognition Arousal/Alertness: Awake/alert Behavior During Therapy: Flat affect Overall Cognitive Status: Impaired/Different from baseline Area of Impairment: Problem solving;Awareness;Safety/judgement;Following commands;Memory;Attention                   Current Attention Level: Focused Memory: Decreased recall of precautions;Decreased short-term memory Following Commands: Follows one step commands with increased time Safety/Judgement: Decreased awareness of deficits Awareness: Intellectual Problem Solving: Slow processing;Decreased initiation;Difficulty sequencing;Requires verbal cues General Comments: A&O to place, person, and year. Cognition deficits impacting ADLs and transfers.   General Comments       Exercises     Shoulder Instructions      Home Living Family/patient expects to be discharged to:: Private residence Living Arrangements: Alone Available Help at Discharge: Family Type of Home: House Home Access: Stairs to enter Secretary/administrator of Steps: 6 Entrance Stairs-Rails: Right;Left Home Layout: One level     Bathroom Shower/Tub: Chief Strategy Officer: Standard                Prior Functioning/Environment Level of Independence: Independent                 OT Problem List: Decreased strength;Decreased activity tolerance;Impaired balance (sitting and/or standing);Decreased coordination;Decreased cognition;Decreased safety awareness;Decreased knowledge of use of DME or AE;Decreased knowledge of precautions;Impaired tone;Impaired UE functional use      OT Treatment/Interventions: Self-care/ADL training;Therapeutic exercise;Energy conservation;Neuromuscular education;DME and/or  AE instruction;Therapeutic activities;Cognitive remediation/compensation;Balance training;Patient/family education    OT Goals(Current goals can be found in the care plan section) Acute Rehab OT Goals Patient Stated Goal: family wants CIR  OT Goal Formulation: With patient/family Time For Goal Achievement: 08/25/19 Potential to Achieve Goals: Good ADL Goals Pt Will Perform Grooming: sitting;with min assist Pt Will Perform Upper Body Bathing: sitting;with min assist Pt Will Perform Lower Body Bathing: with max assist;sit to/from stand Pt Will Transfer to Toilet: with max assist;stand pivot transfer;bedside commode Additional ADL Goal #1: Pt will sit EOB at min A level for 5 minutes to facilitate UB ADLs.  OT Frequency: Min 2X/week   Barriers to D/C:            Co-evaluation              AM-PAC OT "6 Clicks" Daily Activity     Outcome Measure Help from another person eating meals?: A Little Help from another person taking care of personal grooming?: A Lot Help from another person toileting, which includes using toliet, bedpan, or urinal?: Total Help from another person bathing (including washing, rinsing, drying)?: Total Help from another person to put on and taking off regular upper body clothing?: A Lot Help from another person to put on and taking off regular lower body clothing?: Total 6 Click Score: 10   End of Session Nurse Communication: Other (comment)(NT assisted during part of session)  Activity Tolerance: Patient limited by fatigue;Patient tolerated treatment well Patient left: in bed;with call bell/phone within reach;with bed alarm set;with family/visitor present;with restraints reapplied(hand mitts)  OT Visit Diagnosis: Unsteadiness on feet (R26.81);Muscle weakness (generalized) (M62.81);Other symptoms and signs involving cognitive function;Other symptoms and  signs involving the nervous system (R29.898);Cognitive communication deficit (R41.841);Hemiplegia and  hemiparesis                Time: 0233-4356 OT Time Calculation (min): 40 min Charges:  OT General Charges $OT Visit: 1 Visit OT Evaluation $OT Eval Moderate Complexity: 1 Mod OT Treatments $Self Care/Home Management : 23-37 mins  Tyrone Schimke, OT Acute Rehabilitation Services Pager: [phone] Office: [phone]   Hortencia Pilar 08/11/2019, 2:09 PM

## 2019-08-11 NOTE — Social Work (Signed)
CSW was unable to complete sbirt due to pt not being oriented. CSW may attempt to complete at more appropriate time.   Sybilla Malhotra, LCSWA, LCASA Clinical Social Worker [phone]    

## 2019-08-11 NOTE — Evaluation (Signed)
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 MRI of the brain revealed left ACA territory stroke  Clinical Impression  Pt was seen for mobility on side of bed and to take sidesteps on side of bed.  He is motivated but distracted, removed R mitt to use RW with assist.  Pt will be seen with request in place for CIR, and will continue on with WE session scheduled to progress gait and transfers as he can tolerate.  Pt will be seen with two person help if needed for longer gait trips, as well as to increase balance challenges as tolerated.   Bed alarm on and mat replaced next to bed, family in to visit.    Follow Up Recommendations CIR    Equipment Recommendations  None recommended by PT    Recommendations for Other Services Rehab consult     Precautions / Restrictions Precautions Precautions: Fall Precaution Comments: heavy posterior lean sitting EOB Required Braces or Orthoses: (hand mitts) Restrictions Weight Bearing Restrictions: No      Mobility  Bed Mobility Overal bed mobility: Needs Assistance Bed Mobility: Supine to Sit;Sit to Supine     Supine to sit: Min assist Sit to supine: Min assist   General bed mobility comments: min to support trunk and assist scooting out  Transfers Overall transfer level: Needs assistance Equipment used: Rolling walker (2 wheeled);1 person hand held assist Transfers: Sit to/from Stand Sit to Stand: Mod assist         General transfer comment: mod to power up and min to min guard to maintain on RW  Ambulation/Gait Ambulation/Gait assistance: Min assist Gait Distance (Feet): 3 Feet Assistive device: Rolling walker (2 wheeled);1 person hand held  assist Gait Pattern/deviations: Step-to pattern;Decreased stride length;Wide base of support;Trunk flexed Gait velocity: reduced Gait velocity interpretation: <1.31 ft/sec, indicative of household ambulator General Gait Details: sidesteps side of bed with RLE a struggle to lift and step but able to wb on RLE  Stairs            Wheelchair Mobility    Modified Rankin (Stroke Patients Only)       Balance Overall balance assessment: Needs assistance Sitting-balance support: Bilateral upper extremity supported;Feet supported Sitting balance-Leahy Scale: Poor Sitting balance - Comments: can hold briefly then loses posteriorly on balance                                     Pertinent Vitals/Pain Pain Assessment: Faces Faces Pain Scale: No hurt Pain Location: grimacing with bed mobility Pain Descriptors / Indicators: Grimacing Pain Intervention(s): Limited activity within patient's tolerance;Monitored during session;Repositioned    Home Living Family/patient expects to be discharged to:: Private residence Living Arrangements: Alone Available Help at Discharge: Family Type of Home: House Home Access: Stairs to enter Entrance Stairs-Rails: Doctor, general practice of Steps: 5(7+2 with no rail on alternate entrance) Home Layout: One level        Prior Function Level of Independence: Independent         Comments: had recovered gait to walk with no AD outside after first CVA     Hand Dominance   Dominant Hand: Right    Extremity/Trunk  Assessment   Upper Extremity Assessment Upper Extremity Assessment: Defer to OT evaluation RUE Deficits / Details: grossly 4/5. decreased proprioception and motor planning. sensation appears intact RUE Sensation: decreased proprioception RUE Coordination: decreased fine motor;decreased gross motor LUE Deficits / Details: strength WFL. decreased propriception and motor planning    Lower Extremity  Assessment Lower Extremity Assessment: Generalized weakness;RLE deficits/detail;LLE deficits/detail RLE Deficits / Details: R leg 4- strength LLE Deficits / Details: L leg 4+ strength    Cervical / Trunk Assessment Cervical / Trunk Assessment: Kyphotic  Communication   Communication: HOH(dysarthria)  Cognition Arousal/Alertness: Lethargic;Awake/alert Behavior During Therapy: Flat affect Overall Cognitive Status: Impaired/Different from baseline Area of Impairment: Problem solving;Awareness;Safety/judgement;Following commands;Attention                   Current Attention Level: Selective Memory: Decreased short-term memory Following Commands: Follows one step commands inconsistently;Follows one step commands with increased time Safety/Judgement: Decreased awareness of safety Awareness: Intellectual Problem Solving: Slow processing;Requires verbal cues;Requires tactile cues General Comments: A&O x 4. Cognition deficits impacting ADLs and transfers.      General Comments General comments (skin integrity, edema, etc.): Pt is tolerating more this afternoon after a strong effort was needed earlier to do minimal moving.  Pt is motivated, has good PLOF    Exercises     Assessment/Plan    PT Assessment Patient needs continued PT services  PT Problem List Decreased strength;Decreased range of motion;Decreased activity tolerance;Decreased balance;Decreased mobility;Decreased coordination;Decreased knowledge of use of DME;Decreased safety awareness;Other (comment)(O2 sats 97% with effort)       PT Treatment Interventions DME instruction;Gait training;Stair training;Therapeutic activities;Functional mobility training;Therapeutic exercise;Balance training;Neuromuscular re-education;Patient/family education    PT Goals (Current goals can be found in the Care Plan section)  Acute Rehab PT Goals Patient Stated Goal: family wants CIR  PT Goal Formulation: With patient/family Time For  Goal Achievement: 08/25/19 Potential to Achieve Goals: Good    Frequency Min 3X/week   Barriers to discharge Inaccessible home environment;Decreased caregiver support home alone with stairs all entrances    Co-evaluation               AM-PAC PT "6 Clicks" Mobility  Outcome Measure Help needed turning from your back to your side while in a flat bed without using bedrails?: A Little Help needed moving from lying on your back to sitting on the side of a flat bed without using bedrails?: A Little Help needed moving to and from a bed to a chair (including a wheelchair)?: A Little Help needed standing up from a chair using your arms (e.g., wheelchair or bedside chair)?: A Lot Help needed to walk in hospital room?: A Little Help needed climbing 3-5 steps with a railing? : Total 6 Click Score: 15    End of Session Equipment Utilized During Treatment: Gait belt Activity Tolerance: Patient limited by fatigue;Treatment limited secondary to medical complications (Comment) Patient left: in bed;with call bell/phone within reach;with bed alarm set;with family/visitor present Nurse Communication: Mobility status PT Visit Diagnosis: Unsteadiness on feet (R26.81);Muscle weakness (generalized) (M62.81)    Time: 1441-1510 PT Time Calculation (min) (ACUTE ONLY): 29 min   Charges:   PT Evaluation $PT Eval Moderate Complexity: 1 Mod PT Treatments $Gait Training: 8-22 mins       Ramond Dial 08/11/2019, 3:38 PM  Mee Hives, PT MS Acute Rehab Dept. Number: [place] [facility] [place]

## 2019-08-11 NOTE — Social Work (Addendum)
CSW sent prior authorization for Pradaxa to [employer] to determine if patient will have coverage. Awaiting decision.  UPDATE: CSW received denial from patient's insurance to cover Pradaxa. Per insurance, the patient will need to have tried both Eliquis and Xarelto prior to being approved for Pradaxa. CSW updated MD.   Blenda Nicely, LCSW Clinical Social Worker [phone]

## 2019-08-11 NOTE — Progress Notes (Addendum)
STROKE TEAM PROGRESS NOTE   INTERVAL HISTORY 74 year old M patient admitted as a code stroke, undergoing work up. Pt is accompanied by his son at bedside, pt's daughter was present via tele during our visit. Pt resting comfortably, no distress. Pt has some dysarthria, per son this is pt's baseline. Still has ongoing RLE drift/weakness.  He has dementia at baseline but lives alone with supervision from daughter and grandson.  MRI scan shows left ACA infarct with CT angiogram showing left A3  occlusion.  Vitals:   08/10/19 2155 08/11/19 0125 08/11/19 0324 08/11/19 0838  BP: (!) 143/76 (!) 153/91 (!) 173/91 (!) 147/74  Pulse: 69 66 67 61  Resp: 16 18 18 18   Temp: 98 F (36.7 C) 98.2 F (36.8 C)  97.8 F (36.6 C)  TempSrc: Oral Oral  Oral  SpO2: 96% 96% 96% 96%  Weight:      Height:        CBC:  Recent Labs  Lab 08/10/19 0926 08/10/19 0927  WBC 10.9*  --   NEUTROABS 9.2*  --   HGB 14.8 15.3  HCT 45.4 45.0  MCV 91.2  --   PLT 197  --     Basic Metabolic Panel:  Recent Labs  Lab 08/10/19 0926 08/10/19 0927  NA 136 137  K 4.3 4.5  CL 105 104  CO2 20*  --   GLUCOSE 139* 133*  BUN 12 15  CREATININE 0.96 0.70  CALCIUM 9.4  --    Lipid Panel:     Component Value Date/Time   CHOL 120 08/11/2019 0404   TRIG 139 08/11/2019 0404   HDL 35 (L) 08/11/2019 0404   CHOLHDL 3.4 08/11/2019 0404   VLDL 28 08/11/2019 0404   LDLCALC 57 08/11/2019 0404   HgbA1c:  Lab Results  Component Value Date   HGBA1C 6.1 (H) 08/10/2019   Urine Drug Screen: No results found for: LABOPIA, COCAINSCRNUR, LABBENZ, AMPHETMU, THCU, LABBARB  Alcohol Level No results found for: [facility]  IMAGING past 24 hours ECHOCARDIOGRAM COMPLETE  Result Date: 08/10/2019    ECHOCARDIOGRAM REPORT   Patient Name:   FRANCISZEK PLATTEN Date of Exam: 08/10/2019 Medical Rec #:  08/12/2019     Height:       69.0 in Accession #:    [id number]    Weight:       214.9 lb Date of Birth:  1945/04/17     BSA:          2.130 m Patient  Age:    74 years      BP:           189/93 mmHg Patient Gender: M             HR:           66 bpm. Exam Location:  Inpatient Procedure: 2D Echo, Cardiac Doppler and Color Doppler Indications:    Stroke 434.91 / I163.9  History:        Patient has prior history of Echocardiogram examinations, most                 recent 06/06/2018. Risk Factors:Hypertension and Diabetes.                 Pulmonary Embolism.  Sonographer:    06/08/2018 Dance Referring Phys: [id number] JENNIFER YATES IMPRESSIONS  1. Left ventricular ejection fraction, by estimation, is 60 to 65%. The left ventricle has normal function. The left ventricle has no regional wall motion abnormalities. Left ventricular  diastolic parameters are consistent with Grade I diastolic dysfunction (impaired relaxation). Elevated left ventricular end-diastolic pressure.  2. Right ventricular systolic function is normal. The right ventricular size is normal.  3. The mitral valve is normal in structure. Trivial mitral valve regurgitation. No evidence of mitral stenosis.  4. The aortic valve was not well visualized. Aortic valve regurgitation is not visualized. Moderate aortic valve stenosis. Aortic valve mean gradient measures 23.7 mmHg. Aortic valve Vmax measures 3.19 m/s.  5. The inferior vena cava is normal in size with greater than 50% respiratory variability, suggesting right atrial pressure of 3 mmHg. FINDINGS  Left Ventricle: Left ventricular ejection fraction, by estimation, is 60 to 65%. The left ventricle has normal function. The left ventricle has no regional wall motion abnormalities. The left ventricular internal cavity size was normal in size. There is  no left ventricular hypertrophy. Left ventricular diastolic parameters are consistent with Grade I diastolic dysfunction (impaired relaxation). Elevated left ventricular end-diastolic pressure. Right Ventricle: The right ventricular size is normal. No increase in right ventricular wall thickness. Right ventricular  systolic function is normal. Left Atrium: Left atrial size was normal in size. Right Atrium: Right atrial size was normal in size. Pericardium: There is no evidence of pericardial effusion. Mitral Valve: The mitral valve is normal in structure. Normal mobility of the mitral valve leaflets. Trivial mitral valve regurgitation. No evidence of mitral valve stenosis. Tricuspid Valve: The tricuspid valve is normal in structure. Tricuspid valve regurgitation is not demonstrated. No evidence of tricuspid stenosis. Aortic Valve: The aortic valve was not well visualized. . There is moderate thickening and moderate calcification of the aortic valve. Aortic valve regurgitation is not visualized. Moderate aortic stenosis is present. There is moderate thickening of the aortic valve. There is moderate calcification of the aortic valve. Aortic valve mean gradient measures 23.7 mmHg. Aortic valve peak gradient measures 40.6 mmHg. Aortic valve area, by VTI measures 0.50 cm. Pulmonic Valve: The pulmonic valve was normal in structure. Pulmonic valve regurgitation is not visualized. No evidence of pulmonic stenosis. Aorta: The aortic root is normal in size and structure. Venous: The inferior vena cava is normal in size with greater than 50% respiratory variability, suggesting right atrial pressure of 3 mmHg. IAS/Shunts: No atrial level shunt detected by color flow Doppler.  LEFT VENTRICLE PLAX 2D LVIDd:         4.98 cm  Diastology LVIDs:         3.81 cm  LV e' lateral:   10.80 cm/s LV PW:         1.08 cm  LV E/e' lateral: 5.2 LV IVS:        0.94 cm  LV e' medial:    5.44 cm/s LVOT diam:     1.50 cm  LV E/e' medial:  10.3 LV SV:         37 LV SV Index:   17 LVOT Area:     1.77 cm  RIGHT VENTRICLE            IVC RV Basal diam:  2.13 cm    IVC diam: 1.76 cm RV S prime:     9.25 cm/s TAPSE (M-mode): 1.7 cm LEFT ATRIUM             Index       RIGHT ATRIUM           Index LA diam:        3.10 cm 1.46 cm/m  RA Area:  10.10 cm LA Vol  (A2C):   43.7 ml 20.51 ml/m RA Volume:   16.60 ml  7.79 ml/m LA Vol (A4C):   25.4 ml 11.92 ml/m LA Biplane Vol: 34.5 ml 16.20 ml/m  AORTIC VALVE AV Area (Vmax):    0.49 cm AV Area (Vmean):   0.42 cm AV Area (VTI):     0.50 cm AV Vmax:           318.67 cm/s AV Vmean:          227.667 cm/s AV VTI:            0.736 m AV Peak Grad:      40.6 mmHg AV Mean Grad:      23.7 mmHg LVOT Vmax:         88.70 cm/s LVOT Vmean:        54.700 cm/s LVOT VTI:          0.207 m LVOT/AV VTI ratio: 0.28  AORTA Ao Root diam: 3.40 cm MITRAL VALVE MV Area (PHT): 3.12 cm     SHUNTS MV Decel Time: 243 msec     Systemic VTI:  0.21 m MV E velocity: 55.80 cm/s   Systemic Diam: 1.50 cm MV A velocity: 112.00 cm/s MV E/A ratio:  0.50 Chilton Si MD Electronically signed by Chilton Si MD Signature Date/Time: 08/10/2019/6:25:01 PM    Final     PHYSICAL EXAM GEN: NAD, pleasant elderly Caucasian male not in distress, cooperative CVS: RRR, no carotid bruit CHEST: No signs of resp distress, on room air ABD: Soft, NTTP  NEURO:  MENTAL STATUS: AAOx2.  Diminished attention, registration and recall.  Poor insight into illness.  Follows simple midline and one-step commands. LANG/SPEECH: Fluent, intact naming, repetition & comprehension, speech dysarthric, no aphasia (baseline) CRANIAL NERVES:  II: Pupils equal and reactive, no RAPD, normal visual field and fundus  III, IV, VI: EOM intact, no gaze preference or deviation  V: normal  VII: no facial asymmetry  VIII: normal hearing to speech  MOTOR: 4/5 in both upper and lower extremities. RLE drift present with 4/5 weakness.  Normal strength in the left.. REFLEXES: 2/4 throughout, bilateral flexor planters  SENSORY: Normal to touch, temperature & pin prick in all extremiteis  COORD: Normal finger to nose and heel to shin, no tremor, no dysmetria  ASSESSMENT/PLAN Mr. Kmarion Rawl is a 74 y.o. male with history of PE on Xarelto, antiphospholipid syndrome, hypertension,  diabetes, previous stroke in 2020 presenting with waking up on the floor and noted to have right-sided weakness, worse in lower extremity.  Patient was not a TPA candidate due to being on Xarelto at home.  Acute infarct of left body of corpus callosum- likely embolic -suspect paroxysmal A. fib but since patient is already on anticoagulation for pulmonary embolism from presumed antiphospholipid antibody syndrome prolonged cardiac monitoring or TEE may not change treatment management hence will not pursue it  Code Stroke No acute abnormality.   MRI  Acute infarction affecting the left body of the corpus callosum. Brain atrophy with temporal lobe predominance. Chronic small-vessel ischemic changes affecting the cerebral hemispheric white matter.    2D Echo EF 60-65%, normal  LDL 57  HgbA1c 6.1  Xarelto for VTE prophylaxis    Diet   Diet heart healthy/carb modified Room service appropriate? Yes; Fluid consistency: Thin     Xarelto (rivaroxaban) daily prior to admission, now on Xarelto (rivaroxaban) daily.  Will likely switch from xarelto to pradaxa pending cost eval by SW  since pt experienced infarct despite being anticoagulated.   Therapy recommendations:  PT/OT eval   Disposition:  Pending, likely IR  Hypertension  Home meds:  Irbesartan 150 mg qd  Stable . Permissive hypertension (OK if < 220/120) but gradually normalize in 5-7 days . Long-term BP goal normotensive  Hyperlipidemia  Home meds:  Atorvastatin, resumed in hospital  LDL 57, goal < 70  Continue statin at discharge  Diabetes type II Controlled  Home meds:  Metformin 1g bid  HgbA1c 6.1, goal < 7.0  CBGs Recent Labs    08/10/19 1748 08/10/19 2147 08/11/19 0607  GLUCAP 107* 113* 121*      SSI  Other Stroke Risk Factors  Advanced age  Obesity, Body mass index is 31.74 kg/m., recommend weight loss, diet and exercise as appropriate   Hx stroke/TIA  Coronary artery  disease  PE/DVT  Antiphospholipid syndrome- will likely need hematology referral on discharge for follow up   Hospital day # 1   Floreen Comber PA-C [facility] [phone] I have personally obtained history,examined this patient, reviewed notes, independently viewed imaging studies, participated in medical decision making and plan of care.ROS completed by me personally and pertinent positives fully documented  I have made any additions or clarifications directly to the above note. Agree with note above.  He presented with falls likely right leg weakness from embolic left ACA infarct etiology undetermined but strong suspicion for paroxysmal A. fib.  Patient is already on anticoagulation due to pulmonary embolism hence additional testing for A. fib is not necessary.  Consider switching Xarelto to Pradaxa if he can afford it.  Long discussion with the patient and son at the bedside as well as daughter over the phone about his condition and discussed plan of care and answered questions.  Discussed with Dr. Lyndel Safe.  Greater than 50% time during this 35-minute visit was spent on counseling and coordination of care about embolic stroke and answering questions  Delia Heady, MD Medical Director [facility] Pager: [phone] 08/11/2019 1:44 PM   To contact Stroke Continuity provider, please refer to [email or web address]. After hours, contact General Neurology

## 2019-08-11 NOTE — Consult Note (Signed)
    Physical Medicine and Rehabilitation Consult Reason for Consult: Right leg weakness and slurred speech Referring Physician: [facility]  HPI: Roger Ramirez is a 74 y.o. 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  Dementia, CVA 2020.  Patient lives alone independent prior to admission.  He has a son and daughter in the area with questionable ability to provide 24/7 care discharge.  1 level home 6 steps to entry.  He presented on 08/10/2019 with right lower extremity weakness, dysarthria, and increased AMS.  History taken from chart review and daughter due to cognition.  Cranial CT unremarkable for acute intracranial process.  CT angiogram head and neck with atherosclerotic irregularity of the left A3 ACA with high-grade stenosis and suspected subsequent occlusion.  Echocardiogram with ejection fraction of 65%.  Patient did not receive TPA.  Neurology consulted patient currently on aspirin as well as Xarelto.  Tolerating a regular diet.  Therapy evaluations completed with recommendations of physical medicine rehab consult.  Daughter states patient with cognitive deficits at baseline, requiring someone to check on him daily as well as prepare meals.  She notes significant improvement in cognitive processing and weakness since admission.  Review of Systems  Unable to perform ROS: Mental acuity   Past Medical History:  Diagnosis Date  . Antiphospholipid syndrome (HCC)   . CVA (cerebral vascular accident) (HCC) 06/07/2019  . Diabetes mellitus without complication (HCC)   . Hypertension   . Pulmonary embolism (HCC)    History reviewed. No pertinent surgical history. Family History  Problem Relation Age of Onset  . Hypertension Mother   . Hypertension Father   . Dementia Maternal Grandmother   . Stroke Maternal Grandmother    Social History:  reports that he has quit smoking. His smoking use included cigarettes. He has a  20.00 pack-year smoking history. He has never used smokeless tobacco. He reports previous alcohol use. He reports that he does not use drugs. Allergies: No Known Allergies Medications Prior to Admission  Medication Sig Dispense Refill  . ACCU-CHEK AVIVA PLUS test strip 1 each by Other route daily.     . Accu-Chek FastClix Lancets MISC 1 each by Other route in the morning, at noon, in the evening, and at bedtime.     . aspirin EC 81 MG tablet Take 81 mg by mouth daily.    . atorvastatin (LIPITOR) 10 MG tablet Take 10 mg by mouth daily.    . blood glucose meter kit and supplies KIT Dispense based on patient and insurance preference. Use up to four times daily as directed. (FOR ICD-9 250.00, 250.01). (Patient taking differently: 1 each by Other route See admin instructions. Dispense based on patient and insurance preference. Use up to four times daily as directed. (FOR ICD-9 250.00, 250.01).) 1 each 0  . irbesartan (AVAPRO) 150 MG tablet Take 75 mg by mouth daily.    . levothyroxine (SYNTHROID) 75 MCG tablet Take 75 mcg by mouth daily.    . metFORMIN (GLUCOPHAGE) 1000 MG tablet Take 1,000 mg by mouth 2 (two) times daily.    . Omega-3 Fatty Acids (FISH OIL) 1200 MG CAPS Take 1,200 mg by mouth daily.    . rivaroxaban (XARELTO) 20 MG TABS tablet Take 1 tablet (20 mg total) by mouth daily. 90 tablet 3  . vitamin B-12 1000 MCG tablet Take 1 tablet (1,000 mcg total) by mouth daily. 30 tablet 0    Home: Home Living Family/patient expects to   be discharged to:: Private residence Living Arrangements: Alone Available Help at Discharge: Family Type of Home: House Home Access: Stairs to enter Entrance Stairs-Number of Steps: 6 Entrance Stairs-Rails: Right, Left Home Layout: One level Bathroom Shower/Tub: Tub/shower unit Bathroom Toilet: Standard  Functional History: Prior Function Level of Independence: Independent Functional Status:  Mobility: Bed Mobility Overal bed mobility: Needs  Assistance Bed Mobility: Sit to Supine, Supine to Sit Supine to sit: Max assist, HOB elevated Sit to supine: Max assist, +2 for physical assistance General bed mobility comments: Max A to powerup trunk and pivot hips to EOB position. Max +2 A to return to supine with pt fatigued after sitting EOB about a minute Transfers General transfer comment: unable to attempt this session      ADL: ADL Overall ADL's : Needs assistance/impaired Eating/Feeding: Minimal assistance, Bed level Grooming: Moderate assistance, Bed level Upper Body Bathing: Maximal assistance, Bed level Lower Body Bathing: Maximal assistance, +2 for physical assistance, Bed level Upper Body Dressing : Moderate assistance, Bed level Lower Body Dressing: Maximal assistance, +2 for physical assistance, Bed level General ADL Comments: Pt completed bed mobility and sat EOB about 1 minute. Fatigued quickly initiating return to supine. Heavy posterior lean sitting EOB needing mod to max A.   Cognition: Cognition Overall Cognitive Status: Impaired/Different from baseline Orientation Level: Oriented to person, Oriented to place, Disoriented to time, Disoriented to situation Cognition Arousal/Alertness: Awake/alert Behavior During Therapy: Flat affect Overall Cognitive Status: Impaired/Different from baseline Area of Impairment: Problem solving, Awareness, Safety/judgement, Following commands, Memory, Attention Current Attention Level: Focused Memory: Decreased recall of precautions, Decreased short-term memory Following Commands: Follows one step commands with increased time Safety/Judgement: Decreased awareness of deficits Awareness: Intellectual Problem Solving: Slow processing, Decreased initiation, Difficulty sequencing, Requires verbal cues General Comments: A&O x 4. Cognition deficits impacting ADLs and transfers.  Blood pressure (!) 184/94, pulse 76, temperature 98.3 F (36.8 C), temperature source Oral, resp. rate  16, height 5' 9" (1.753 m), weight 97.5 kg, SpO2 98 %. Physical Exam  Vitals reviewed. Constitutional: He appears well-developed and well-nourished.  Bilateral upper extremity mittens  HENT:  Head: Normocephalic and atraumatic.  Eyes: EOM are normal. Right eye exhibits no discharge. Left eye exhibits no discharge.  Neck: No tracheal deviation present. No thyromegaly present.  Respiratory: Effort normal. No stridor. No respiratory distress.  GI: Soft. He exhibits no distension.  Musculoskeletal:     Comments: No edema or tenderness in extremities  Neurological: He is alert.  Alert and oriented, except for president with increased time and minimal cueing Mild dysarthria Limited awareness of deficits.   Follows one-step commands. Motor: Bilateral upper extremities: 5/5 proximal distal Left lower extremity: 4-4+/5 proximal distal Right lower extremity: Hip flexion, knee extension 3+/5, ankle dorsiflexion 3+/5 Sensation appears intact light touch  Skin: Skin is warm and dry.  Psychiatric: His speech is tangential. Cognition and memory are impaired. He expresses impulsivity.    Results for orders placed or performed during the hospital encounter of 08/10/19 (from the past 24 hour(s))  Hemoglobin A1c     Status: Abnormal   Collection Time: 08/10/19  3:26 PM  Result Value Ref Range   Hgb A1c MFr Bld 6.1 (H) 4.8 - 5.6 %   Mean Plasma Glucose 128.37 mg/dL  TSH     Status: None   Collection Time: 08/10/19  3:26 PM  Result Value Ref Range   TSH 1.117 0.350 - 4.500 uIU/mL  Glucose, capillary     Status: Abnormal     Collection Time: 08/10/19  5:48 PM  Result Value Ref Range   Glucose-Capillary 107 (H) 70 - 99 mg/dL  Glucose, capillary     Status: Abnormal   Collection Time: 08/10/19  9:47 PM  Result Value Ref Range   Glucose-Capillary 113 (H) 70 - 99 mg/dL   Comment 1 Notify RN    Comment 2 Document in Chart   Lipid panel     Status: Abnormal   Collection Time: 08/11/19  4:04 AM   Result Value Ref Range   Cholesterol 120 0 - 200 mg/dL   Triglycerides 139 <150 mg/dL   HDL 35 (L) >40 mg/dL   Total CHOL/HDL Ratio 3.4 RATIO   VLDL 28 0 - 40 mg/dL   LDL Cholesterol 57 0 - 99 mg/dL  Glucose, capillary     Status: Abnormal   Collection Time: 08/11/19  6:07 AM  Result Value Ref Range   Glucose-Capillary 121 (H) 70 - 99 mg/dL   Comment 1 Notify RN    Comment 2 Document in Chart   Glucose, capillary     Status: Abnormal   Collection Time: 08/11/19 12:52 PM  Result Value Ref Range   Glucose-Capillary 110 (H) 70 - 99 mg/dL   CT ANGIO HEAD W OR WO CONTRAST  Result Date: 08/10/2019 CLINICAL DATA:  Code stroke follow-up, right facial droop and leg weakness EXAM: CT ANGIOGRAPHY HEAD AND NECK TECHNIQUE: Multidetector CT imaging of the head and neck was performed using the standard protocol during bolus administration of intravenous contrast. Multiplanar CT image reconstructions and MIPs were obtained to evaluate the vascular anatomy. Carotid stenosis measurements (when applicable) are obtained utilizing NASCET criteria, using the distal internal carotid diameter as the denominator. CONTRAST:  75mL OMNIPAQUE IOHEXOL 350 MG/ML SOLN COMPARISON:  None. FINDINGS: CTA NECK Aortic arch: Calcified plaque along the arch and patent great vessel origins. Common origin of the innominate and left common carotid arteries. There is irregular noncalcified plaque along the left subclavian origin with mild stenosis. Right carotid system: Patent. Primarily calcified plaque along the proximal ICA causing less than 50% stenosis. Left carotid system: Patent. Atherosclerotic wall thickening along the common carotid. Primarily calcified plaque along the proximal ICA causing less than 50% stenosis. Vertebral arteries: Patent. Skeleton: Degenerative changes of the cervical spine. There is partial fusion of the C4 and C5 vertebral bodies. Other neck: No mass or adenopathy. Upper chest: No apical lung mass. Review  of the MIP images confirms the above findings CTA HEAD Anterior circulation: Intracranial internal carotid arteries are patent with calcified plaque causing mild to moderate stenosis. Middle cerebral arteries are patent with mild atherosclerotic irregularity more distally. Right anterior cerebral artery is patent. The proximal left anterior cerebral artery is patent. There is atherosclerotic irregularity of the left A3 ACA with high-grade stenosis and probable subsequent occlusion. Posterior circulation: Intracranial vertebral arteries, basilar artery, and posterior cerebral arteries are patent. Small posterior communicating arteries are present. There is short segment high-grade stenosis of the proximal left P2 PCA. Venous sinuses: As permitted by contrast timing, patent. Review of the MIP images confirms the above findings IMPRESSION: Atherosclerotic irregularity of left A3 ACA with high-grade stenosis and suspected subsequent occlusion. Short segment high-grade stenosis proximal left P2 PCA. Calcified plaque at the ICA origins causing less than 50% stenosis. Initial is results were discussed by telephone at the time of interpretation on 08/10/2019 at 9:54 am to provider Dr. Aroor, who verbally acknowledged these results. Electronically Signed   By: Praneil   M.D.     On: 08/10/2019 10:07   CT ANGIO NECK W OR WO CONTRAST  Result Date: 08/10/2019 CLINICAL DATA:  Code stroke follow-up, right facial droop and leg weakness EXAM: CT ANGIOGRAPHY HEAD AND NECK TECHNIQUE: Multidetector CT imaging of the head and neck was performed using the standard protocol during bolus administration of intravenous contrast. Multiplanar CT image reconstructions and MIPs were obtained to evaluate the vascular anatomy. Carotid stenosis measurements (when applicable) are obtained utilizing NASCET criteria, using the distal internal carotid diameter as the denominator. CONTRAST:  82m OMNIPAQUE IOHEXOL 350 MG/ML SOLN COMPARISON:   None. FINDINGS: CTA NECK Aortic arch: Calcified plaque along the arch and patent great vessel origins. Common origin of the innominate and left common carotid arteries. There is irregular noncalcified plaque along the left subclavian origin with mild stenosis. Right carotid system: Patent. Primarily calcified plaque along the proximal ICA causing less than 50% stenosis. Left carotid system: Patent. Atherosclerotic wall thickening along the common carotid. Primarily calcified plaque along the proximal ICA causing less than 50% stenosis. Vertebral arteries: Patent. Skeleton: Degenerative changes of the cervical spine. There is partial fusion of the C4 and C5 vertebral bodies. Other neck: No mass or adenopathy. Upper chest: No apical lung mass. Review of the MIP images confirms the above findings CTA HEAD Anterior circulation: Intracranial internal carotid arteries are patent with calcified plaque causing mild to moderate stenosis. Middle cerebral arteries are patent with mild atherosclerotic irregularity more distally. Right anterior cerebral artery is patent. The proximal left anterior cerebral artery is patent. There is atherosclerotic irregularity of the left A3 ACA with high-grade stenosis and probable subsequent occlusion. Posterior circulation: Intracranial vertebral arteries, basilar artery, and posterior cerebral arteries are patent. Small posterior communicating arteries are present. There is short segment high-grade stenosis of the proximal left P2 PCA. Venous sinuses: As permitted by contrast timing, patent. Review of the MIP images confirms the above findings IMPRESSION: Atherosclerotic irregularity of left A3 ACA with high-grade stenosis and suspected subsequent occlusion. Short segment high-grade stenosis proximal left P2 PCA. Calcified plaque at the ICA origins causing less than 50% stenosis. Initial is results were discussed by telephone at the time of interpretation on 08/10/2019 at 9:54 am to provider  Dr. ALorraine Lax who verbally acknowledged these results. Electronically Signed   By: PMacy MisM.D.   On: 08/10/2019 10:07   MR BRAIN WO CONTRAST  Result Date: 08/10/2019 CLINICAL DATA:  Right facial droop, slurred speech and confusion today. Negative acute CT evaluation. EXAM: MRI HEAD WITHOUT CONTRAST TECHNIQUE: Multiplanar, multiecho pulse sequences of the brain and surrounding structures were obtained without intravenous contrast. COMPARISON:  CT studies earlier same day.  MRI 06/06/2018. FINDINGS: Brain: Brain atrophy with some temporal lobe predominance. Diffusion imaging shows acute infarction in the left body of the corpus callosum. No other acute infarction. Elsewhere, there are moderate chronic small-vessel ischemic changes of the hemispheric white matter. No mass lesion, hemorrhage, hydrocephalus or extra-axial collection. Vascular: Major vessels at the base of the brain show flow. Skull and upper cervical spine: Negative Sinuses/Orbits: Clear/normal Other: None IMPRESSION: Acute infarction affecting the left body of the corpus callosum. Brain atrophy with temporal lobe predominance. Chronic small-vessel ischemic changes affecting the cerebral hemispheric white matter. Electronically Signed   By: MNelson ChimesM.D.   On: 08/10/2019 10:57   ECHOCARDIOGRAM COMPLETE  Result Date: 08/10/2019    ECHOCARDIOGRAM REPORT   Patient Name:   CSHAD LEDVINADate of Exam: 08/10/2019 Medical Rec #:  [id number]    Height:  69.0 in Accession #:    [id number]    Weight:       214.9 lb Date of Birth:  25-May-1945     BSA:          2.130 m Patient Age:    26 years      BP:           189/93 mmHg Patient Gender: M             HR:           66 bpm. Exam Location:  Inpatient Procedure: 2D Echo, Cardiac Doppler and Color Doppler Indications:    Stroke 434.91 / I163.9  History:        Patient has prior history of Echocardiogram examinations, most                 recent 06/06/2018. Risk Factors:Hypertension and Diabetes.                  Pulmonary Embolism.  Sonographer:    Jonelle Sidle Dance Referring Phys: [place]  1. Left ventricular ejection fraction, by estimation, is 60 to 65%. The left ventricle has normal function. The left ventricle has no regional wall motion abnormalities. Left ventricular diastolic parameters are consistent with Grade I diastolic dysfunction (impaired relaxation). Elevated left ventricular end-diastolic pressure.  2. Right ventricular systolic function is normal. The right ventricular size is normal.  3. The mitral valve is normal in structure. Trivial mitral valve regurgitation. No evidence of mitral stenosis.  4. The aortic valve was not well visualized. Aortic valve regurgitation is not visualized. Moderate aortic valve stenosis. Aortic valve mean gradient measures 23.7 mmHg. Aortic valve Vmax measures 3.19 m/s.  5. The inferior vena cava is normal in size with greater than 50% respiratory variability, suggesting right atrial pressure of 3 mmHg. FINDINGS  Left Ventricle: Left ventricular ejection fraction, by estimation, is 60 to 65%. The left ventricle has normal function. The left ventricle has no regional wall motion abnormalities. The left ventricular internal cavity size was normal in size. There is  no left ventricular hypertrophy. Left ventricular diastolic parameters are consistent with Grade I diastolic dysfunction (impaired relaxation). Elevated left ventricular end-diastolic pressure. Right Ventricle: The right ventricular size is normal. No increase in right ventricular wall thickness. Right ventricular systolic function is normal. Left Atrium: Left atrial size was normal in size. Right Atrium: Right atrial size was normal in size. Pericardium: There is no evidence of pericardial effusion. Mitral Valve: The mitral valve is normal in structure. Normal mobility of the mitral valve leaflets. Trivial mitral valve regurgitation. No evidence of mitral valve stenosis. Tricuspid  Valve: The tricuspid valve is normal in structure. Tricuspid valve regurgitation is not demonstrated. No evidence of tricuspid stenosis. Aortic Valve: The aortic valve was not well visualized. . There is moderate thickening and moderate calcification of the aortic valve. Aortic valve regurgitation is not visualized. Moderate aortic stenosis is present. There is moderate thickening of the aortic valve. There is moderate calcification of the aortic valve. Aortic valve mean gradient measures 23.7 mmHg. Aortic valve peak gradient measures 40.6 mmHg. Aortic valve area, by VTI measures 0.50 cm. Pulmonic Valve: The pulmonic valve was normal in structure. Pulmonic valve regurgitation is not visualized. No evidence of pulmonic stenosis. Aorta: The aortic root is normal in size and structure. Venous: The inferior vena cava is normal in size with greater than 50% respiratory variability, suggesting right atrial pressure of 3 mmHg. IAS/Shunts: No atrial  level shunt detected by color flow Doppler.  LEFT VENTRICLE PLAX 2D LVIDd:         4.98 cm  Diastology LVIDs:         3.81 cm  LV e' lateral:   10.80 cm/s LV PW:         1.08 cm  LV E/e' lateral: 5.2 LV IVS:        0.94 cm  LV e' medial:    5.44 cm/s LVOT diam:     1.50 cm  LV E/e' medial:  10.3 LV SV:         37 LV SV Index:   17 LVOT Area:     1.77 cm  RIGHT VENTRICLE            IVC RV Basal diam:  2.13 cm    IVC diam: 1.76 cm RV S prime:     9.25 cm/s TAPSE (M-mode): 1.7 cm LEFT ATRIUM             Index       RIGHT ATRIUM           Index LA diam:        3.10 cm 1.46 cm/m  RA Area:     10.10 cm LA Vol (A2C):   43.7 ml 20.51 ml/m RA Volume:   16.60 ml  7.79 ml/m LA Vol (A4C):   25.4 ml 11.92 ml/m LA Biplane Vol: 34.5 ml 16.20 ml/m  AORTIC VALVE AV Area (Vmax):    0.49 cm AV Area (Vmean):   0.42 cm AV Area (VTI):     0.50 cm AV Vmax:           318.67 cm/s AV Vmean:          227.667 cm/s AV VTI:            0.736 m AV Peak Grad:      40.6 mmHg AV Mean Grad:      23.7  mmHg LVOT Vmax:         88.70 cm/s LVOT Vmean:        54.700 cm/s LVOT VTI:          0.207 m LVOT/AV VTI ratio: 0.28  AORTA Ao Root diam: 3.40 cm MITRAL VALVE MV Area (PHT): 3.12 cm     SHUNTS MV Decel Time: 243 msec     Systemic VTI:  0.21 m MV E velocity: 55.80 cm/s   Systemic Diam: 1.50 cm MV A velocity: 112.00 cm/s MV E/A ratio:  0.50 Tiffany Wyanet MD Electronically signed by Tiffany Vancouver MD Signature Date/Time: 08/10/2019/6:25:01 PM    Final    CT HEAD CODE STROKE WO CONTRAST  Result Date: 08/10/2019 CLINICAL DATA:  Code stroke. Right-sided facial droop and right leg weakness EXAM: CT HEAD WITHOUT CONTRAST TECHNIQUE: Contiguous axial images were obtained from the base of the skull through the vertex without intravenous contrast. COMPARISON:  06/06/2018 brain MRI FINDINGS: Brain: No evidence of acute infarction, hemorrhage, hydrocephalus, extra-axial collection or mass lesion/mass effect. Atrophy that is severe in the temporal lobes. Chronic small vessel ischemia with confluent low-density in the deep cerebral white matter and remote lacunar infarct at the right caudate head. Vascular: Atherosclerotic calcification.  No hyperdense vessel Skull: Normal. Negative for fracture or focal lesion. Sinuses/Orbits: Negative Other: These results were communicated to Dr. Aroor at 9:34 amon 4/29/2021by text page via the AMION messaging system. ASPECTS ([place] Stroke Program Early CT Score) - Ganglionic level infarction (caudate, lentiform nuclei,   internal capsule, insula, M1-M3 cortex): 7 - Supraganglionic infarction (M4-M6 cortex): 3 Total score (0-10 with 10 being normal): 10 IMPRESSION: 1. No acute finding. 2. Severe temporal lobe atrophy. 3. Chronic small vessel ischemia. Electronically Signed   By: Jonathon  Watts M.D.   On: 08/10/2019 09:35    Assessment/Plan: Diagnosis: Left ACA infarct Stroke: Continue secondary stroke prophylaxis and Risk Factor Modification listed below:   Antiplatelet therapy:    Blood Pressure Management:  Continue current medication with prn's with permisive HTN per primary team Statin Agent:   Diabetes management:   Tobacco abuse:   Labs independently reviewed.  Records reviewed and summated above.  1. Does the need for close, 24 hr/day medical supervision in concert with the patient's rehab needs make it unreasonable for this patient to be served in a less intensive setting? Potentially 2. Co-Morbidities requiring supervision/potential complications: saddle pulmonary embolism (continue Xarelto), tobacco use (counsel), HTN (monitor and provide prns in accordance with increased physical exertion and pain), DM (Monitor in accordance with exercise and adjust meds as necessary),?  Dementia, history of CVA  3. Due to safety, disease management and patient education, does the patient require 24 hr/day rehab nursing? Potentially 4. Does the patient require coordinated care of a physician, rehab nurse, therapy disciplines of PT/OT/SLP to address physical and functional deficits in the context of the above medical diagnosis(es)? Potentially Addressing deficits in the following areas: balance, endurance, locomotion, strength, transferring, bathing, dressing, toileting, cognition and psychosocial support 5. Can the patient actively participate in an intensive therapy program of at least 3 hrs of therapy per day at least 5 days per week? Yes 6. The potential for patient to make measurable gains while on inpatient rehab is excellent 7. Anticipated functional outcomes upon discharge from inpatient rehab are supervision  with PT, supervision with OT, supervision and min assist with SLP. 8. Estimated rehab length of stay to reach the above functional goals is: 4-6 days. 9. Anticipated discharge destination: Home 10. Overall Rehab/Functional Prognosis: good  RECOMMENDATIONS: This patient's condition is appropriate for continued rehabilitative care in the following setting: Daughter  notes patient with significant functional as well as cognitive improvement since admission.  Recommend CIR if patient does not progress to supervision level of functioning and caregiver support is available upon discharge. Patient has agreed to participate in recommended program. Potentially Note that insurance prior authorization may be required for reimbursement for recommended care.  Comment: Rehab Admissions Coordinator to follow up.  I have personally performed a face to face diagnostic evaluation, including, but not limited to relevant history and physical exam findings, of this patient and developed relevant assessment and plan.  Additionally, I have reviewed and concur with the physician assistant's documentation above.    , MD, ABPMR Daniel J Angiulli, PA-C 08/11/2019 

## 2019-08-11 NOTE — Progress Notes (Signed)
Inpatient Rehabilitation Admissions Coordinator  I will follow up on patient's progress with therapy on Monday to assist with planning dispo.  Ottie Glazier, RN, MSN Rehab Admissions Coordinator [phone] 08/11/2019 5:12 PM

## 2019-08-11 NOTE — Care Management (Signed)
Per Mana C. W/[employer] rx help desk:  (Pradaxa Non-formulary- PA  Required)   Preferred brand : Eliquis 2.5mg  and or 5mg .bid  or Xarelto  20mg  bid  $47.00  No PA required [facility] pharmacy : 

## 2019-08-11 NOTE — Progress Notes (Signed)
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  Patient lives at home alone, he is quite ambulatory and independent.  His family checks on him.  He was found with right-sided facial droop and slurred speech with unknown last normal time.  Patient did have some confusion on presentation. In the emergency room, code stroke was called.  Not a TPA candidate because of unknown last normal as well as being on Xarelto.  Otherwise hemodynamically stable.  Assessment & Plan:   Principal Problem:   CVA (cerebral vascular accident) (HCC) Active Problems:   Hypercholesterolemia with hypertriglyceridemia   Hypothyroid   Cognitive dysfunction   Antiphospholipid syndrome (HCC)   Diabetes mellitus type 2, noninsulin dependent (HCC)   Obesity (BMI 30.0-34.9)  Acute left ACA ischemic stroke: Clinical findings, dysarthria, confusion and right hemiparesis. CT head findings, no acute findings.  Severe temporal lobe atrophy.  Chronic small vessel disease. MRI of the brain, left ACA territory stroke.  CTA of the head and neck:Atherosclerotic disease, high-grade stenosis A3 ACA.  Calcified plaque at the ICA origin. 2D echocardiogram, pending today. Antiplatelet therapy, on Xarelto and aspirin at home.  Continue it. Neurology recommended changing anticoagulation regimen. Will need to verify with pharmacy if Pradaxa is covered.  If not then will consider Eliquis. LDL, 57 already on a statin.   Hemoglobin A1c, 6.1.  Patient on Metformin at home.  Will stay on insulin.  Has contrasted studies. DVT prophylaxis, Xarelto. Therapy recommendations, working today.  Hypertension: Allow permissive hypertension today.  Hyperlipidemia: On Lipitor 10 mg.  LDL 57.   Appropriate.  History of saddle PE: As above.  We will try to convert to Pradaxa.  If not then will convert to Eliquis.   DVT prophylaxis: Xarelto Code Status: Full code Family Communication: Son at the bedside Disposition Plan: Status is: Inpatient  Remains inpatient appropriate because:Inpatient level of care appropriate due to severity of illness   Dispo: The patient is from: Home              Anticipated d/c is to: CIR              Anticipated d/c date is: 2 days              Patient currently is medically stable to d/c.          Consultants:   Neurology  Procedures:   None  Antimicrobials:   None   Subjective: Patient seen and examined.  He was able to discuss most of the issues.  He was not sure what brought him into the hospital.  Son was at the bedside.  He was able to eat his breakfast with no problem.  He is agreeable to do rehab.  Objective: Vitals:   08/10/19 2155 08/11/19 0125 08/11/19 0324 08/11/19 0838  BP: (!) 143/76 (!) 153/91 (!) 173/91 (!) 147/74  Pulse: 69 66 67 61  Resp: Temp: 98 F (36.7 C) 98.2 F (36.8 C)  97.8 F (36.6 C)  TempSrc: Oral Oral  Oral  SpO2: 96% 96% 96% 96%  Weight:      Height:        Intake/Output Summary (Last 24 hours) at 08/11/2019 1118 Last data filed at 08/11/2019  0700 Gross per 24 hour  Intake 783.33 ml  Output 880 ml  Net -96.67 ml   Filed Weights   08/10/19 1153  Weight: 97.5 kg    Examination:  General exam: Appears calm and comfortable, not in any distress.  Pleasant.  Confused.  Alert oriented x2. Respiratory system: Clear to auscultation. Respiratory effort normal. Cardiovascular system: S1 & S2 heard, RRR. No JVD, murmurs, rubs, gallops or clicks. No pedal edema. Gastrointestinal system: Abdomen is nondistended, soft and nontender. No organomegaly or masses felt. Normal bowel sounds heard. Central nervous system: Alert and oriented x2.  Speech is clear, slow  Generalized  weakness.  No appreciable hemiparesis.    Data Reviewed: I have personally reviewed following labs and imaging studies  CBC: Recent Labs  Lab 08/10/19 0926 08/10/19 0927  WBC 10.9*  --   NEUTROABS 9.2*  --   HGB 14.8 15.3  HCT 45.4 45.0  MCV 91.2  --   PLT 197  --    Basic Metabolic Panel: Recent Labs  Lab 08/10/19 0926 08/10/19 0927  NA 136 137  K 4.3 4.5  CL 105 104  CO2 20*  --   GLUCOSE 139* 133*  BUN 12 15  CREATININE 0.96 0.70  CALCIUM 9.4  --    GFR: Estimated Creatinine Clearance: 90.4 mL/min (by C-G formula based on SCr of 0.7 mg/dL). Liver Function Tests: Recent Labs  Lab 08/10/19 0926  AST 24  ALT 19  ALKPHOS 51  BILITOT 0.8  PROT 7.4  ALBUMIN 4.1   No results for input(s): LIPASE, AMYLASE in the last 168 hours. No results for input(s): AMMONIA in the last 168 hours. Coagulation Profile: Recent Labs  Lab 08/10/19 0926  INR 1.4*   Cardiac Enzymes: No results for input(s): CKTOTAL, CKMB, CKMBINDEX, TROPONINI in the last 168 hours. BNP (last 3 results) No results for input(s): PROBNP in the last 8760 hours. HbA1C: Recent Labs    08/10/19 1526  HGBA1C 6.1*   CBG: Recent Labs  Lab 08/10/19 1748 08/10/19 2147 08/11/19 0607  GLUCAP 107* 113* 121*   Lipid Profile: Recent Labs    08/11/19 0404  CHOL 120  HDL 35*  LDLCALC 57  TRIG 938  CHOLHDL 3.4   Thyroid Function Tests: Recent Labs    08/10/19 1526  TSH 1.117   Anemia Panel: No results for input(s): VITAMINB12, FOLATE, FERRITIN, TIBC, IRON, RETICCTPCT in the last 72 hours. Sepsis Labs: No results for input(s): PROCALCITON, LATICACIDVEN in the last 168 hours.  Recent Results (from the past 240 hour(s))  Respiratory Panel by RT PCR (Flu A&B, Covid) - Nasopharyngeal Swab     Status: None   Collection Time: 08/10/19  9:55 AM   Specimen: Nasopharyngeal Swab  Result Value Ref Range Status   SARS Coronavirus 2 by RT PCR NEGATIVE NEGATIVE Final    Comment:  (NOTE) SARS-CoV-2 target nucleic acids are NOT DETECTED. The SARS-CoV-2 RNA is generally detectable in upper respiratoy specimens during the acute phase of infection. The lowest concentration of SARS-CoV-2 viral copies this assay can detect is 131 copies/mL. A negative result does not preclude SARS-Cov-2 infection and should not be used as the sole basis for treatment or other patient management decisions. A negative result may occur with  improper specimen collection/handling, submission of specimen other than nasopharyngeal swab, presence of viral mutation(s) within the areas targeted by this assay, and inadequate number of viral copies (<131 copies/mL). A negative result must be combined with clinical observations, patient history,  and epidemiological information. The expected result is Negative. Fact Sheet for Patients:  https://www.moore.com/ Fact Sheet for Healthcare Providers:  https://www.young.biz/ This test is not yet ap proved or cleared by the [place] FDA and  has been authorized for detection and/or diagnosis of SARS-CoV-2 by FDA under an Emergency Use Authorization (EUA). This EUA will remain  in effect (meaning this test can be used) for the duration of the COVID-19 declaration under Section 564(b)(1) of the Act, 21 U.S.C. section 360bbb-3(b)(1), unless the authorization is terminated or revoked sooner.    Influenza A by PCR NEGATIVE NEGATIVE Final   Influenza B by PCR NEGATIVE NEGATIVE Final    Comment: (NOTE) The Xpert Xpress SARS-CoV-2/FLU/RSV assay is intended as an aid in  the diagnosis of influenza from Nasopharyngeal swab specimens and  should not be used as a sole basis for treatment. Nasal washings and  aspirates are unacceptable for Xpert Xpress SARS-CoV-2/FLU/RSV  testing. Fact Sheet for Patients: https://www.moore.com/ Fact Sheet for Healthcare  Providers: https://www.young.biz/ This test is not yet approved or cleared by the [place] FDA and  has been authorized for detection and/or diagnosis of SARS-CoV-2 by  FDA under an Emergency Use Authorization (EUA). This EUA will remain  in effect (meaning this test can be used) for the duration of the  Covid-19 declaration under Section 564(b)(1) of the Act, 21  U.S.C. section 360bbb-3(b)(1), unless the authorization is  terminated or revoked. Performed at [facility], [address] [place], [place] [address]          Radiology Studies: CT ANGIO HEAD W OR WO CONTRAST  Result Date: 08/10/2019 CLINICAL DATA:  Code stroke follow-up, right facial droop and leg weakness EXAM: CT ANGIOGRAPHY HEAD AND NECK TECHNIQUE: Multidetector CT imaging of the head and neck was performed using the standard protocol during bolus administration of intravenous contrast. Multiplanar CT image reconstructions and MIPs were obtained to evaluate the vascular anatomy. Carotid stenosis measurements (when applicable) are obtained utilizing NASCET criteria, using the distal internal carotid diameter as the denominator. CONTRAST:  45mL OMNIPAQUE IOHEXOL 350 MG/ML SOLN COMPARISON:  None. FINDINGS: CTA NECK Aortic arch: Calcified plaque along the arch and patent great vessel origins. Common origin of the innominate and left common carotid arteries. There is irregular noncalcified plaque along the left subclavian origin with mild stenosis. Right carotid system: Patent. Primarily calcified plaque along the proximal ICA causing less than 50% stenosis. Left carotid system: Patent. Atherosclerotic wall thickening along the common carotid. Primarily calcified plaque along the proximal ICA causing less than 50% stenosis. Vertebral arteries: Patent. Skeleton: Degenerative changes of the cervical spine. There is partial fusion of the C4 and C5 vertebral bodies. Other neck: No mass or adenopathy. Upper  chest: No apical lung mass. Review of the MIP images confirms the above findings CTA HEAD Anterior circulation: Intracranial internal carotid arteries are patent with calcified plaque causing mild to moderate stenosis. Middle cerebral arteries are patent with mild atherosclerotic irregularity more distally. Right anterior cerebral artery is patent. The proximal left anterior cerebral artery is patent. There is atherosclerotic irregularity of the left A3 ACA with high-grade stenosis and probable subsequent occlusion. Posterior circulation: Intracranial vertebral arteries, basilar artery, and posterior cerebral arteries are patent. Small posterior communicating arteries are present. There is short segment high-grade stenosis of the proximal left P2 PCA. Venous sinuses: As permitted by contrast timing, patent. Review of the MIP images confirms the above findings IMPRESSION: Atherosclerotic irregularity of left A3 ACA with high-grade stenosis  and suspected subsequent occlusion. Short segment high-grade stenosis proximal left P2 PCA. Calcified plaque at the ICA origins causing less than 50% stenosis. Initial is results were discussed by telephone at the time of interpretation on 08/10/2019 at 9:54 am to provider Dr. Laurence Slate, who verbally acknowledged these results. Electronically Signed   By: Guadlupe Spanish M.D.   On: 08/10/2019 10:07   CT ANGIO NECK W OR WO CONTRAST  Result Date: 08/10/2019 CLINICAL DATA:  Code stroke follow-up, right facial droop and leg weakness EXAM: CT ANGIOGRAPHY HEAD AND NECK TECHNIQUE: Multidetector CT imaging of the head and neck was performed using the standard protocol during bolus administration of intravenous contrast. Multiplanar CT image reconstructions and MIPs were obtained to evaluate the vascular anatomy. Carotid stenosis measurements (when applicable) are obtained utilizing NASCET criteria, using the distal internal carotid diameter as the denominator. CONTRAST:  75mL OMNIPAQUE  IOHEXOL 350 MG/ML SOLN COMPARISON:  None. FINDINGS: CTA NECK Aortic arch: Calcified plaque along the arch and patent great vessel origins. Common origin of the innominate and left common carotid arteries. There is irregular noncalcified plaque along the left subclavian origin with mild stenosis. Right carotid system: Patent. Primarily calcified plaque along the proximal ICA causing less than 50% stenosis. Left carotid system: Patent. Atherosclerotic wall thickening along the common carotid. Primarily calcified plaque along the proximal ICA causing less than 50% stenosis. Vertebral arteries: Patent. Skeleton: Degenerative changes of the cervical spine. There is partial fusion of the C4 and C5 vertebral bodies. Other neck: No mass or adenopathy. Upper chest: No apical lung mass. Review of the MIP images confirms the above findings CTA HEAD Anterior circulation: Intracranial internal carotid arteries are patent with calcified plaque causing mild to moderate stenosis. Middle cerebral arteries are patent with mild atherosclerotic irregularity more distally. Right anterior cerebral artery is patent. The proximal left anterior cerebral artery is patent. There is atherosclerotic irregularity of the left A3 ACA with high-grade stenosis and probable subsequent occlusion. Posterior circulation: Intracranial vertebral arteries, basilar artery, and posterior cerebral arteries are patent. Small posterior communicating arteries are present. There is short segment high-grade stenosis of the proximal left P2 PCA. Venous sinuses: As permitted by contrast timing, patent. Review of the MIP images confirms the above findings IMPRESSION: Atherosclerotic irregularity of left A3 ACA with high-grade stenosis and suspected subsequent occlusion. Short segment high-grade stenosis proximal left P2 PCA. Calcified plaque at the ICA origins causing less than 50% stenosis. Initial is results were discussed by telephone at the time of interpretation  on 08/10/2019 at 9:54 am to provider Dr. Laurence Slate, who verbally acknowledged these results. Electronically Signed   By: Guadlupe Spanish M.D.   On: 08/10/2019 10:07   MR BRAIN WO CONTRAST  Result Date: 08/10/2019 CLINICAL DATA:  Right facial droop, slurred speech and confusion today. Negative acute CT evaluation. EXAM: MRI HEAD WITHOUT CONTRAST TECHNIQUE: Multiplanar, multiecho pulse sequences of the brain and surrounding structures were obtained without intravenous contrast. COMPARISON:  CT studies earlier same day.  MRI 06/06/2018. FINDINGS: Brain: Brain atrophy with some temporal lobe predominance. Diffusion imaging shows acute infarction in the left body of the corpus callosum. No other acute infarction. Elsewhere, there are moderate chronic small-vessel ischemic changes of the hemispheric white matter. No mass lesion, hemorrhage, hydrocephalus or extra-axial collection. Vascular: Major vessels at the base of the brain show flow. Skull and upper cervical spine: Negative Sinuses/Orbits: Clear/normal Other: None IMPRESSION: Acute infarction affecting the left body of the corpus callosum. Brain atrophy with temporal lobe predominance.  Chronic small-vessel ischemic changes affecting the cerebral hemispheric white matter. Electronically Signed   By: Paulina Fusi M.D.   On: 08/10/2019 10:57   ECHOCARDIOGRAM COMPLETE  Result Date: 08/10/2019    ECHOCARDIOGRAM REPORT   Patient Name:   Roger Ramirez Date of Exam: 08/10/2019 Medical Rec #:  [id number]     Height:       69.0 in Accession #:    [id number]    Weight:       214.9 lb Date of Birth:  Aug 04, 1945     BSA:          2.130 m Patient Age:    74 years      BP:           189/93 mmHg Patient Gender: M             HR:           66 bpm. Exam Location:  Inpatient Procedure: 2D Echo, Cardiac Doppler and Color Doppler Indications:    Stroke 434.91 / I163.9  History:        Patient has prior history of Echocardiogram examinations, most                 recent 06/06/2018. Risk  Factors:Hypertension and Diabetes.                 Pulmonary Embolism.  Sonographer:    Elmarie Shiley Dance Referring Phys: [id number] JENNIFER YATES IMPRESSIONS  1. Left ventricular ejection fraction, by estimation, is 60 to 65%. The left ventricle has normal function. The left ventricle has no regional wall motion abnormalities. Left ventricular diastolic parameters are consistent with Grade I diastolic dysfunction (impaired relaxation). Elevated left ventricular end-diastolic pressure.  2. Right ventricular systolic function is normal. The right ventricular size is normal.  3. The mitral valve is normal in structure. Trivial mitral valve regurgitation. No evidence of mitral stenosis.  4. The aortic valve was not well visualized. Aortic valve regurgitation is not visualized. Moderate aortic valve stenosis. Aortic valve mean gradient measures 23.7 mmHg. Aortic valve Vmax measures 3.19 m/s.  5. The inferior vena cava is normal in size with greater than 50% respiratory variability, suggesting right atrial pressure of 3 mmHg. FINDINGS  Left Ventricle: Left ventricular ejection fraction, by estimation, is 60 to 65%. The left ventricle has normal function. The left ventricle has no regional wall motion abnormalities. The left ventricular internal cavity size was normal in size. There is  no left ventricular hypertrophy. Left ventricular diastolic parameters are consistent with Grade I diastolic dysfunction (impaired relaxation). Elevated left ventricular end-diastolic pressure. Right Ventricle: The right ventricular size is normal. No increase in right ventricular wall thickness. Right ventricular systolic function is normal. Left Atrium: Left atrial size was normal in size. Right Atrium: Right atrial size was normal in size. Pericardium: There is no evidence of pericardial effusion. Mitral Valve: The mitral valve is normal in structure. Normal mobility of the mitral valve leaflets. Trivial mitral valve regurgitation. No evidence  of mitral valve stenosis. Tricuspid Valve: The tricuspid valve is normal in structure. Tricuspid valve regurgitation is not demonstrated. No evidence of tricuspid stenosis. Aortic Valve: The aortic valve was not well visualized. . There is moderate thickening and moderate calcification of the aortic valve. Aortic valve regurgitation is not visualized. Moderate aortic stenosis is present. There is moderate thickening of the aortic valve. There is moderate calcification of the aortic valve. Aortic valve mean gradient measures 23.7 mmHg. Aortic valve peak gradient measures  40.6 mmHg. Aortic valve area, by VTI measures 0.50 cm. Pulmonic Valve: The pulmonic valve was normal in structure. Pulmonic valve regurgitation is not visualized. No evidence of pulmonic stenosis. Aorta: The aortic root is normal in size and structure. Venous: The inferior vena cava is normal in size with greater than 50% respiratory variability, suggesting right atrial pressure of 3 mmHg. IAS/Shunts: No atrial level shunt detected by color flow Doppler.  LEFT VENTRICLE PLAX 2D LVIDd:         4.98 cm  Diastology LVIDs:         3.81 cm  LV e' lateral:   10.80 cm/s LV PW:         1.08 cm  LV E/e' lateral: 5.2 LV IVS:        0.94 cm  LV e' medial:    5.44 cm/s LVOT diam:     1.50 cm  LV E/e' medial:  10.3 LV SV:         37 LV SV Index:   17 LVOT Area:     1.77 cm  RIGHT VENTRICLE            IVC RV Basal diam:  2.13 cm    IVC diam: 1.76 cm RV S prime:     9.25 cm/s TAPSE (M-mode): 1.7 cm LEFT ATRIUM             Index       RIGHT ATRIUM           Index LA diam:        3.10 cm 1.46 cm/m  RA Area:     10.10 cm LA Vol (A2C):   43.7 ml 20.51 ml/m RA Volume:   16.60 ml  7.79 ml/m LA Vol (A4C):   25.4 ml 11.92 ml/m LA Biplane Vol: 34.5 ml 16.20 ml/m  AORTIC VALVE AV Area (Vmax):    0.49 cm AV Area (Vmean):   0.42 cm AV Area (VTI):     0.50 cm AV Vmax:           318.67 cm/s AV Vmean:          227.667 cm/s AV VTI:            0.736 m AV Peak Grad:       40.6 mmHg AV Mean Grad:      23.7 mmHg LVOT Vmax:         88.70 cm/s LVOT Vmean:        54.700 cm/s LVOT VTI:          0.207 m LVOT/AV VTI ratio: 0.28  AORTA Ao Root diam: 3.40 cm MITRAL VALVE MV Area (PHT): 3.12 cm     SHUNTS MV Decel Time: 243 msec     Systemic VTI:  0.21 m MV E velocity: 55.80 cm/s   Systemic Diam: 1.50 cm MV A velocity: 112.00 cm/s MV E/A ratio:  0.50 Skeet Latch MD Electronically signed by Skeet Latch MD Signature Date/Time: 08/10/2019/6:25:01 PM    Final    CT HEAD CODE STROKE WO CONTRAST  Result Date: 08/10/2019 CLINICAL DATA:  Code stroke. Right-sided facial droop and right leg weakness EXAM: CT HEAD WITHOUT CONTRAST TECHNIQUE: Contiguous axial images were obtained from the base of the skull through the vertex without intravenous contrast. COMPARISON:  06/06/2018 brain MRI FINDINGS: Brain: No evidence of acute infarction, hemorrhage, hydrocephalus, extra-axial collection or mass lesion/mass effect. Atrophy that is severe in the temporal lobes. Chronic small vessel ischemia with confluent low-density  in the deep cerebral white matter and remote lacunar infarct at the right caudate head. Vascular: Atherosclerotic calcification.  No hyperdense vessel Skull: Normal. Negative for fracture or focal lesion. Sinuses/Orbits: Negative Other: These results were communicated to Dr. Laurence Slate at 9:34 amon 4/29/2021by text page via the [facility] messaging system. ASPECTS [facility] Stroke Program Early CT Score) - Ganglionic level infarction (caudate, lentiform nuclei, internal capsule, insula, M1-M3 cortex): 7 - Supraganglionic infarction (M4-M6 cortex): 3 Total score (0-10 with 10 being normal): 10 IMPRESSION: 1. No acute finding. 2. Severe temporal lobe atrophy. 3. Chronic small vessel ischemia. Electronically Signed   By: Marnee Spring M.D.   On: 08/10/2019 09:35        Scheduled Meds:  aspirin EC  81 mg Oral Daily   atorvastatin  40 mg Oral Daily   docusate sodium  100 mg Oral BID    feeding supplement (ENSURE ENLIVE)  237 mL Oral BID BM   insulin aspart  0-15 Units Subcutaneous TID WC   insulin aspart  0-5 Units Subcutaneous QHS   levothyroxine  75 mcg Oral Daily   multivitamin with minerals  1 tablet Oral Daily   rivaroxaban  20 mg Oral Daily   Continuous Infusions:  sodium chloride 50 mL/hr at 08/10/19 1814     LOS: 1 day    Time spent: 25 minutes    Dorcas Carrow, MD [facility] Pager [phone]

## 2019-08-12 DIAGNOSIS — E119 Type 2 diabetes mellitus without complications: Secondary | ICD-10-CM | POA: Diagnosis not present

## 2019-08-12 LAB — GLUCOSE, CAPILLARY
Glucose-Capillary: 111 mg/dL — ABNORMAL HIGH (ref 70–99)
Glucose-Capillary: 108 mg/dL — ABNORMAL HIGH (ref 70–99)
Glucose-Capillary: 171 mg/dL — ABNORMAL HIGH (ref 70–99)
Glucose-Capillary: 146 mg/dL — ABNORMAL HIGH (ref 70–99)
Glucose-Capillary: 127 mg/dL — ABNORMAL HIGH (ref 70–99)

## 2019-08-12 MED ORDER — TRAZODONE HCL 50 MG PO TABS
50.0000 mg | ORAL_TABLET | Freq: Every evening | ORAL | Status: DC | PRN
  Administered 2019-08-12: 50 mg via ORAL
  Filled 2019-08-12: qty 1

## 2019-08-12 MED ORDER — APIXABAN 5 MG PO TABS
5.0000 mg | ORAL_TABLET | Freq: Two times a day (BID) | ORAL | Status: DC
  Administered 2019-08-12 – 2019-08-16 (×9): 5 mg via ORAL
  Filled 2019-08-12 (×9): qty 1

## 2019-08-12 MED ORDER — IRBESARTAN 150 MG PO TABS
75.0000 mg | ORAL_TABLET | Freq: Every day | ORAL | Status: DC
  Administered 2019-08-12 – 2019-08-16 (×5): 75 mg via ORAL
  Filled 2019-08-12 (×5): qty 1

## 2019-08-12 NOTE — Progress Notes (Signed)
Had an uneventful day today.  Did attempt to get up out of the bed without assistance one time and was redirected and reminded that he is in the hospital, as he did think he needed to get up to get ready for work.  He was also reminded that he was retired and he laughed and got right back into the bed.  No aggression or impulsive behavior with sitter at his side.  Daughter also at his side for a couple hours then left to go home a while and came back and he has been doing well.

## 2019-08-12 NOTE — Progress Notes (Signed)
PROGRESS NOTE    Roger Ramirez  GNF:[id number] DOB: 01-29-1946 DOA: 08/10/2019 PCP: Seward Carol, MD    Brief Narrative:  74 year old gentleman with history of saddle pulmonary embolism on Xarelto, hypertension, diabetes and history of a stroke in 2020 brought to the emergency room as a code stroke.  Patient lives at home alone, he is quite ambulatory and independent.  His family checks on him.  He was found with right-sided facial droop and slurred speech with unknown last normal time.  Patient did have some confusion on presentation. In the emergency room, code stroke was called.  Not a TPA candidate because of unknown last normal as well as being on Xarelto.  Otherwise hemodynamically stable.  Assessment & Plan:   Principal Problem:   CVA (cerebral vascular accident) ([place]) Active Problems:   Hypercholesterolemia with hypertriglyceridemia   Hypothyroid   Cognitive dysfunction   Antiphospholipid syndrome ([name])   Diabetes mellitus type 2, noninsulin dependent (HCC)   Obesity (BMI 30.0-34.9)   Acute ischemic stroke ([name])   History of CVA (cerebrovascular accident)   History of pulmonary embolism   Tobacco abuse   Essential hypertension  Acute left ACA ischemic stroke: Clinical findings, dysarthria, confusion and right hemiparesis. CT head findings, no acute findings.  Severe temporal lobe atrophy.  Chronic small vessel disease. MRI of the brain, left ACA territory stroke.  CTA of the head and neck:Atherosclerotic disease, high-grade stenosis A3 ACA.  Calcified plaque at the ICA origin. 2D echocardiogram, normal.  No source of embolism. Antiplatelet therapy, on Xarelto and aspirin at home.  Neurology recommended changing anticoagulation regimen. Insurance not covering for Pradaxa, will change to Eliquis.  Once on Eliquis, discontinue aspirin due to no added benefit but increased risk of bleeding. LDL, 57 already on a statin.   Hemoglobin A1c, 6.1.  Patient on Metformin at home.   Will stay on insulin.  Has contrasted studies. DVT prophylaxis, Eliquis. Therapy recommendations, acute inpatient rehab.  Hypertension: We will resume his home medications today.  Gradual control with a.m. less than 140.  Hyperlipidemia: On Lipitor 10 mg.  LDL 57.  Appropriate.  History of saddle PE: As above.  Changing to Eliquis.     DVT prophylaxis: Eliquis. Code Status: Full code Family Communication: Son at the bedside Disposition Plan: Status is: Inpatient  Remains inpatient appropriate because:Inpatient level of care appropriate due to severity of illness   Dispo: The patient is from: Home              Anticipated d/c is to: CIR              Anticipated d/c date is: 2 days              Patient currently is medically stable to d/c.          Consultants:   Neurology  Procedures:   None  Antimicrobials:   None   Subjective: Seen and examined.  Son at the bedside.  No overnight events.  He slept well after getting Ambien.  Patient is pleasantly confused. Objective: Vitals:   08/11/19 2307 08/12/19 0322 08/12/19 0654 08/12/19 0831  BP: (!) 162/99 (!) 183/74  (!) 188/71  Pulse: 79 62  (!) 59  Resp: 19 17  18   Temp: 98.3 F (36.8 C) 97.8 F (36.6 C)  97.8 F (36.6 C)  TempSrc: Oral Oral  Oral  SpO2:    97%  Weight:   97.6 kg   Height:  Intake/Output Summary (Last 24 hours) at 08/12/2019 1104 Last data filed at 08/12/2019 0400 Gross per 24 hour  Intake 1500 ml  Output 1925 ml  Net -425 ml   Filed Weights   08/10/19 1153 08/12/19 0654  Weight: 97.5 kg 97.6 kg    Examination:  General exam: Appears calm and comfortable, not in any distress.  Pleasant.  Confused.  Alert oriented x 1-2. Respiratory system: Clear to auscultation. Respiratory effort normal. Cardiovascular system: S1 & S2 heard, RRR. No JVD, murmurs, rubs, gallops or clicks. No pedal edema. Gastrointestinal system: Abdomen is nondistended, soft and nontender. No organomegaly or  masses felt. Normal bowel sounds heard. Central nervous system: Alert and oriented 1-2.  Pleasantly confused. Speech is clear, slow  Generalized weakness.  No appreciable hemiparesis.    Data Reviewed: I have personally reviewed following labs and imaging studies  CBC: Recent Labs  Lab 08/10/19 0926 08/10/19 0927  WBC 10.9*  --   NEUTROABS 9.2*  --   HGB 14.8 15.3  HCT 45.4 45.0  MCV 91.2  --   PLT 197  --    Basic Metabolic Panel: Recent Labs  Lab 08/10/19 0926 08/10/19 0927  NA 136 137  K 4.3 4.5  CL 105 104  CO2 20*  --   GLUCOSE 139* 133*  BUN 12 15  CREATININE 0.96 0.70  CALCIUM 9.4  --    GFR: Estimated Creatinine Clearance: 90.6 mL/min (by C-G formula based on SCr of 0.7 mg/dL). Liver Function Tests: Recent Labs  Lab 08/10/19 0926  AST 24  ALT 19  ALKPHOS 51  BILITOT 0.8  PROT 7.4  ALBUMIN 4.1   No results for input(s): LIPASE, AMYLASE in the last 168 hours. No results for input(s): AMMONIA in the last 168 hours. Coagulation Profile: Recent Labs  Lab 08/10/19 0926  INR 1.4*   Cardiac Enzymes: No results for input(s): CKTOTAL, CKMB, CKMBINDEX, TROPONINI in the last 168 hours. BNP (last 3 results) No results for input(s): PROBNP in the last 8760 hours. HbA1C: Recent Labs    08/10/19 1526  HGBA1C 6.1*   CBG: Recent Labs  Lab 08/11/19 1252 08/11/19 1649 08/11/19 2146 08/12/19 0615 08/12/19 0802  GLUCAP 110* 100* 123* 111* 108*   Lipid Profile: Recent Labs    08/11/19 0404  CHOL 120  HDL 35*  LDLCALC 57  TRIG 784  CHOLHDL 3.4   Thyroid Function Tests: Recent Labs    08/10/19 1526  TSH 1.117   Anemia Panel: No results for input(s): VITAMINB12, FOLATE, FERRITIN, TIBC, IRON, RETICCTPCT in the last 72 hours. Sepsis Labs: No results for input(s): PROCALCITON, LATICACIDVEN in the last 168 hours.  Recent Results (from the past 240 hour(s))  Respiratory Panel by RT PCR (Flu A&B, Covid) - Nasopharyngeal Swab     Status: None     Collection Time: 08/10/19  9:55 AM   Specimen: Nasopharyngeal Swab  Result Value Ref Range Status   SARS Coronavirus 2 by RT PCR NEGATIVE NEGATIVE Final    Comment: (NOTE) SARS-CoV-2 target nucleic acids are NOT DETECTED. The SARS-CoV-2 RNA is generally detectable in upper respiratoy specimens during the acute phase of infection. The lowest concentration of SARS-CoV-2 viral copies this assay can detect is 131 copies/mL. A negative result does not preclude SARS-Cov-2 infection and should not be used as the sole basis for treatment or other patient management decisions. A negative result may occur with  improper specimen collection/handling, submission of specimen other than nasopharyngeal swab, presence of viral  mutation(s) within the areas targeted by this assay, and inadequate number of viral copies (<131 copies/mL). A negative result must be combined with clinical observations, patient history, and epidemiological information. The expected result is Negative. Fact Sheet for Patients:  https://www.moore.com/ Fact Sheet for Healthcare Providers:  https://www.young.biz/ This test is not yet ap proved or cleared by the [place] FDA and  has been authorized for detection and/or diagnosis of SARS-CoV-2 by FDA under an Emergency Use Authorization (EUA). This EUA will remain  in effect (meaning this test can be used) for the duration of the COVID-19 declaration under Section 564(b)(1) of the Act, 21 U.S.C. section 360bbb-3(b)(1), unless the authorization is terminated or revoked sooner.    Influenza A by PCR NEGATIVE NEGATIVE Final   Influenza B by PCR NEGATIVE NEGATIVE Final    Comment: (NOTE) The Xpert Xpress SARS-CoV-2/FLU/RSV assay is intended as an aid in  the diagnosis of influenza from Nasopharyngeal swab specimens and  should not be used as a sole basis for treatment. Nasal washings and  aspirates are unacceptable for Xpert Xpress  SARS-CoV-2/FLU/RSV  testing. Fact Sheet for Patients: https://www.moore.com/ Fact Sheet for Healthcare Providers: https://www.young.biz/ This test is not yet approved or cleared by the [place] FDA and  has been authorized for detection and/or diagnosis of SARS-CoV-2 by  FDA under an Emergency Use Authorization (EUA). This EUA will remain  in effect (meaning this test can be used) for the duration of the  Covid-19 declaration under Section 564(b)(1) of the Act, 21  U.S.C. section 360bbb-3(b)(1), unless the authorization is  terminated or revoked. Performed at [facility], [address] [place], [place] [address]          Radiology Studies: ECHOCARDIOGRAM COMPLETE  Result Date: 08/10/2019    ECHOCARDIOGRAM REPORT   Patient Name:   Roger Ramirez Date of Exam: 08/10/2019 Medical Rec #:  [id number]     Height:       69.0 in Accession #:    [id number]    Weight:       214.9 lb Date of Birth:  [birth year]     BSA:          2.130 m Patient Age:    74 years      BP:           189/93 mmHg Patient Gender: M             HR:           66 bpm. Exam Location:  Inpatient Procedure: 2D Echo, Cardiac Doppler and Color Doppler Indications:    Stroke 434.91 / I163.9  History:        Patient has prior history of Echocardiogram examinations, most                 recent 06/06/2018. Risk Factors:Hypertension and Diabetes.                 Pulmonary Embolism.  Sonographer:    Elmarie Shiley Dance Referring Phys: [id number] JENNIFER YATES IMPRESSIONS  1. Left ventricular ejection fraction, by estimation, is 60 to 65%. The left ventricle has normal function. The left ventricle has no regional wall motion abnormalities. Left ventricular diastolic parameters are consistent with Grade I diastolic dysfunction (impaired relaxation). Elevated left ventricular end-diastolic pressure.  2. Right ventricular systolic function is normal. The right ventricular size is normal.  3. The mitral  valve is normal in structure. Trivial mitral valve regurgitation. No evidence of mitral stenosis.  4.  The aortic valve was not well visualized. Aortic valve regurgitation is not visualized. Moderate aortic valve stenosis. Aortic valve mean gradient measures 23.7 mmHg. Aortic valve Vmax measures 3.19 m/s.  5. The inferior vena cava is normal in size with greater than 50% respiratory variability, suggesting right atrial pressure of 3 mmHg. FINDINGS  Left Ventricle: Left ventricular ejection fraction, by estimation, is 60 to 65%. The left ventricle has normal function. The left ventricle has no regional wall motion abnormalities. The left ventricular internal cavity size was normal in size. There is  no left ventricular hypertrophy. Left ventricular diastolic parameters are consistent with Grade I diastolic dysfunction (impaired relaxation). Elevated left ventricular end-diastolic pressure. Right Ventricle: The right ventricular size is normal. No increase in right ventricular wall thickness. Right ventricular systolic function is normal. Left Atrium: Left atrial size was normal in size. Right Atrium: Right atrial size was normal in size. Pericardium: There is no evidence of pericardial effusion. Mitral Valve: The mitral valve is normal in structure. Normal mobility of the mitral valve leaflets. Trivial mitral valve regurgitation. No evidence of mitral valve stenosis. Tricuspid Valve: The tricuspid valve is normal in structure. Tricuspid valve regurgitation is not demonstrated. No evidence of tricuspid stenosis. Aortic Valve: The aortic valve was not well visualized. . There is moderate thickening and moderate calcification of the aortic valve. Aortic valve regurgitation is not visualized. Moderate aortic stenosis is present. There is moderate thickening of the aortic valve. There is moderate calcification of the aortic valve. Aortic valve mean gradient measures 23.7 mmHg. Aortic valve peak gradient measures 40.6 mmHg.  Aortic valve area, by VTI measures 0.50 cm. Pulmonic Valve: The pulmonic valve was normal in structure. Pulmonic valve regurgitation is not visualized. No evidence of pulmonic stenosis. Aorta: The aortic root is normal in size and structure. Venous: The inferior vena cava is normal in size with greater than 50% respiratory variability, suggesting right atrial pressure of 3 mmHg. IAS/Shunts: No atrial level shunt detected by color flow Doppler.  LEFT VENTRICLE PLAX 2D LVIDd:         4.98 cm  Diastology LVIDs:         3.81 cm  LV e' lateral:   10.80 cm/s LV PW:         1.08 cm  LV E/e' lateral: 5.2 LV IVS:        0.94 cm  LV e' medial:    5.44 cm/s LVOT diam:     1.50 cm  LV E/e' medial:  10.3 LV SV:         37 LV SV Index:   17 LVOT Area:     1.77 cm  RIGHT VENTRICLE            IVC RV Basal diam:  2.13 cm    IVC diam: 1.76 cm RV S prime:     9.25 cm/s TAPSE (M-mode): 1.7 cm LEFT ATRIUM             Index       RIGHT ATRIUM           Index LA diam:        3.10 cm 1.46 cm/m  RA Area:     10.10 cm LA Vol (A2C):   43.7 ml 20.51 ml/m RA Volume:   16.60 ml  7.79 ml/m LA Vol (A4C):   25.4 ml 11.92 ml/m LA Biplane Vol: 34.5 ml 16.20 ml/m  AORTIC VALVE AV Area (Vmax):    0.49 cm AV Area (  Vmean):   0.42 cm AV Area (VTI):     0.50 cm AV Vmax:           318.67 cm/s AV Vmean:          227.667 cm/s AV VTI:            0.736 m AV Peak Grad:      40.6 mmHg AV Mean Grad:      23.7 mmHg LVOT Vmax:         88.70 cm/s LVOT Vmean:        54.700 cm/s LVOT VTI:          0.207 m LVOT/AV VTI ratio: 0.28  AORTA Ao Root diam: 3.40 cm MITRAL VALVE MV Area (PHT): 3.12 cm     SHUNTS MV Decel Time: 243 msec     Systemic VTI:  0.21 m MV E velocity: 55.80 cm/s   Systemic Diam: 1.50 cm MV A velocity: 112.00 cm/s MV E/A ratio:  0.50 Chilton Si MD Electronically signed by Chilton Si MD Signature Date/Time: 08/10/2019/6:25:01 PM    Final         Scheduled Meds: . apixaban  5 mg Oral BID  . atorvastatin  40 mg Oral Daily  .  docusate sodium  100 mg Oral BID  . feeding supplement (ENSURE ENLIVE)  237 mL Oral BID BM  . insulin aspart  0-15 Units Subcutaneous TID WC  . insulin aspart  0-5 Units Subcutaneous QHS  . irbesartan  75 mg Oral Daily  . levothyroxine  75 mcg Oral Daily  . multivitamin with minerals  1 tablet Oral Daily   Continuous Infusions:    LOS: 2 days    Time spent: 25 minutes    Dorcas Carrow, MD [facility] Pager [phone]

## 2019-08-12 NOTE — Progress Notes (Signed)
Physical Therapy Treatment Patient Details Name: Roger Ramirez MRN: [id number] DOB: [birth year] Today's Date: 08/12/2019    History of Present Illness 74 year old gentleman with history of saddle pulmonary embolism on Xarelto, hypertension, diabetes and history of a stroke in 2020 brought to the emergency room as a code stroke. MRI of the brain revealed left ACA territory stroke    PT Comments    Pt supine in bed.  Pt daughter present at bedside.  He required assistance to mobilize and ambulate this session.  Current level of assistance is min to moderate assistance.  Pt required facilitation for forward weight shifting as he presents with posterior bias in both sitting and standing.  Pt continues to benefit from placement in CIR facility to maximize functional gains before return home.     Follow Up Recommendations  CIR     Equipment Recommendations  None recommended by PT    Recommendations for Other Services       Precautions / Restrictions Precautions Precautions: Fall Precaution Comments: heavy posterior lean sitting EOB Restrictions Weight Bearing Restrictions: No    Mobility  Bed Mobility Overal bed mobility: Needs Assistance Bed Mobility: Supine to Sit     Supine to sit: Mod assist     General bed mobility comments: Pt able to initiate movement to edge of bed but unable to complete movement without mod assist.  Assistance to move LEs to edge of bed and elevate trunk into a seated position.  Posterior bias noted.  Transfers Overall transfer level: Needs assistance Equipment used: Rolling walker (2 wheeled) Transfers: Sit to/from Stand Sit to Stand: Mod assist         General transfer comment: Mod assistance to power up with posterior bias causing LOB and moderate assistance to correct.  Ambulation/Gait Ambulation/Gait assistance: Min assist;Mod assist Gait Distance (Feet): 60 Feet Assistive device: Rolling walker (2 wheeled) Gait Pattern/deviations:  Step-through pattern;Decreased dorsiflexion - right;Decreased stance time - right;Trunk flexed Gait velocity: reduced   General Gait Details: Pt required cues for R foot height and step length.  LOB with L turn required moderate assistance to correct.   Stairs             Wheelchair Mobility    Modified Rankin (Stroke Patients Only)       Balance Overall balance assessment: Needs assistance Sitting-balance support: Bilateral upper extremity supported;Feet supported Sitting balance-Leahy Scale: Poor Sitting balance - Comments: can hold briefly then loses posteriorly on balance                                    Cognition Arousal/Alertness: Awake/alert Behavior During Therapy: Flat affect Overall Cognitive Status: Impaired/Different from baseline Area of Impairment: Problem solving;Awareness;Safety/judgement;Following commands;Attention                   Current Attention Level: Selective Memory: Decreased short-term memory Following Commands: Follows one step commands inconsistently;Follows one step commands with increased time Safety/Judgement: Decreased awareness of safety Awareness: Intellectual Problem Solving: Slow processing;Requires verbal cues;Requires tactile cues General Comments: Pt's daughter present and did not give insight into his baseline.      Exercises      General Comments        Pertinent Vitals/Pain Pain Assessment: No/denies pain    Home Living     Available Help at Discharge: Family Type of Home: House  Prior Function            PT Goals (current goals can now be found in the care plan section) Acute Rehab PT Goals Patient Stated Goal: family wants CIR  Potential to Achieve Goals: Good Progress towards PT goals: Progressing toward goals    Frequency    Min 3X/week      PT Plan Current plan remains appropriate    Co-evaluation              AM-PAC PT "6 Clicks" Mobility    Outcome Measure  Help needed turning from your back to your side while in a flat bed without using bedrails?: A Little Help needed moving from lying on your back to sitting on the side of a flat bed without using bedrails?: A Lot Help needed moving to and from a bed to a chair (including a wheelchair)?: A Lot Help needed standing up from a chair using your arms (e.g., wheelchair or bedside chair)?: A Lot Help needed to walk in hospital room?: A Little Help needed climbing 3-5 steps with a railing? : Total 6 Click Score: 13    End of Session Equipment Utilized During Treatment: Gait belt Activity Tolerance: Patient limited by fatigue;Treatment limited secondary to medical complications (Comment) Patient left: with family/visitor present;in chair;with nursing/sitter in room(Pt with safety sitter present so no alarm donned.) Nurse Communication: Mobility status PT Visit Diagnosis: Unsteadiness on feet (R26.81);Muscle weakness (generalized) (M62.81)     Time: 6387-5643 PT Time Calculation (min) (ACUTE ONLY): 17 min  Charges:  $Gait Training: 8-22 mins                     Erasmo Leventhal , PTA Acute Rehabilitation Services Pager [phone] Office [place] 08/12/2019, 2:13 PM

## 2019-08-12 NOTE — Evaluation (Signed)
Speech Language Pathology Evaluation Patient Details Name: Roger Ramirez MRN: [id number] DOB: Jul 22, 1945 Today's Date: 08/12/2019 Time: 1115-1140 SLP Time Calculation (min) (ACUTE ONLY): 25 min  Problem List:  Patient Active Problem List   Diagnosis Date Noted  . Acute ischemic stroke (HCC)   . History of CVA (cerebrovascular accident)   . History of pulmonary embolism   . Tobacco abuse   . Essential hypertension   . Obesity (BMI 30.0-34.9) 08/10/2019  . Cerebral infarction (HCC)   . CVA (cerebral vascular accident) (HCC) 06/06/2018  . Antiphospholipid syndrome (HCC) 06/06/2018  . Diabetes mellitus type 2, noninsulin dependent (HCC) 06/06/2018  . Hypercholesterolemia with hypertriglyceridemia 01/23/2016  . Impaired fasting glucose 01/23/2016  . B12 deficiency 01/23/2016  . Hypothyroid 01/23/2016  . Cognitive dysfunction 01/23/2016  . Pleuritic chest pain   . Leg DVT (deep venous thromboembolism), acute (HCC)   . Near syncope   . Elevated BP   . History of pulmonary embolus (PE)   . Pulmonary embolism (HCC) 12/24/2014   Past Medical History:  Past Medical History:  Diagnosis Date  . Antiphospholipid syndrome (HCC)   . CVA (cerebral vascular accident) (HCC) 06/07/2019  . Diabetes mellitus without complication (HCC)   . Hypertension   . Pulmonary embolism [facility])    Past Surgical History: History reviewed. No pertinent surgical history. HPI:  Roger Ramirez 74 y/m, with prior history of saddle pulmonary embolism on Xarelto, hypertension, diabetes and history of a stroke in 2020 brought to the emergency room as a code stroke. MRI of the brain revealed left ACA territory stroke. Pt has baseline cognitive issues and was last seen by ST 06/07/2018. At that time, pt demonstrated cognitive challenges with basic tasks such as telling time, naming common objects, simple divergent naming, and problem solving.   Assessment / Plan / Recommendation Clinical Impression  Cognitive/linguistic  evaluation comleted. Patient has known baseline deficits know form prior ST evaluation 06/10/2018. He presents today with very similar impairments that have progressed further. His prior deficits are noted were telling time, naming common objects, simple divergent naming, and problem solving. Today he is requiring additional to process information and formulate a response. Following 1-step directions required moderate cues and he had reduced awareness of mistakes and not able to self-correct. Would recommend ST services to address cognition and language deficits to reduce caregiver burden. Upon discharge pt will require 24 hour supervision for safety.      SLP Assessment  SLP Recommendation/Assessment: Patient needs continued Speech Lanaguage Pathology Services SLP Visit Diagnosis: Cognitive communication deficit (R41.841)    Follow Up Recommendations  Home health SLP    Frequency and Duration min 2x/week  2 weeks      SLP Evaluation Cognition  Overall Cognitive Status: Difficult to assess Arousal/Alertness: Awake/alert Orientation Level: Oriented to person;Oriented to place;Disoriented to time Attention: Focused Focused Attention: Impaired Focused Attention Impairment: Verbal basic Memory: Impaired Memory Impairment: Decreased recall of new information;Decreased short term memory Decreased Short Term Memory: Verbal basic Immediate Memory Recall: Sock;Blue;Bed Memory Recall Sock: Without Cue Memory Recall Blue: Without Cue Memory Recall Bed: With Cue Awareness: Appears intact Problem Solving: Impaired Problem Solving Impairment: Functional basic;Functional complex;Verbal complex Safety/Judgment: Impaired       Comprehension  Auditory Comprehension Overall Auditory Comprehension: Impaired at baseline Yes/No Questions: Within Functional Limits Commands: Impaired One Step Basic Commands: 50-74% accurate Conversation: Simple Reading Comprehension Reading Status: Not tested     Expression Expression Primary Mode of Expression: Verbal Verbal Expression Initiation: No impairment Automatic Speech:  Name;Social Response Level of Generative/Spontaneous Verbalization: Sentence Repetition: Impaired Level of Impairment: Sentence level Naming: Impairment Responsive: 51-75% accurate Confrontation: Impaired Convergent: 50-74% accurate Divergent: 25-49% accurate Pragmatics: No impairment Interfering Components: Attention;Premorbid deficit Effective Techniques: Open ended questions Written Expression Dominant Hand: Right Written Expression: Not tested   Oral / Motor  Oral Motor/Sensory Function Overall Oral Motor/Sensory Function: Mild impairment(right sided facial droop) Motor Speech Overall Motor Speech: Appears within functional limits for tasks assessed Respiration: Within functional limits Phonation: Normal Resonance: Within functional limits Articulation: Within functional limitis Intelligibility: Intelligible Motor Planning: Witnin functional limits Motor Speech Errors: Not applicable Effective Techniques: Slow rate   GO                    Wynelle Bourgeois, MA CCC-SLP 08/12/2019, 12:18 PM

## 2019-08-12 NOTE — Discharge Instructions (Signed)

## 2019-08-13 DIAGNOSIS — E119 Type 2 diabetes mellitus without complications: Secondary | ICD-10-CM | POA: Diagnosis not present

## 2019-08-13 LAB — GLUCOSE, CAPILLARY
Glucose-Capillary: 100 mg/dL — ABNORMAL HIGH (ref 70–99)
Glucose-Capillary: 105 mg/dL — ABNORMAL HIGH (ref 70–99)
Glucose-Capillary: 120 mg/dL — ABNORMAL HIGH (ref 70–99)

## 2019-08-13 MED ORDER — MELATONIN 3 MG PO TABS
3.0000 mg | ORAL_TABLET | Freq: Every day | ORAL | Status: DC
  Administered 2019-08-13 – 2019-08-14 (×2): 3 mg via ORAL
  Filled 2019-08-13 (×2): qty 1

## 2019-08-13 NOTE — Progress Notes (Signed)
Roger Ramirez is alert but confused'  follows command when reoriented and redirected.  Slept on and off. When awake he tries to pull on medical devices such as telemetry leads and external catheter.  Pt pulled off external cath off and attempted to get oob to  move his bowel said daughter at bedside. Bedpan offered but pt did not move his bowel. Peri care done bed linen and gown changed and new  external catheter applied.  Hands mittens applied to prevent pt from pull out medical devices,. Pt made comfortable, Low bed ,floor mat and other safety measures in place.& in use . RN will continue to monitor pt closely.

## 2019-08-13 NOTE — Progress Notes (Signed)
PROGRESS NOTE    Roger Ramirez  WUJ:[id number] DOB: 1945/10/13 DOA: 08/10/2019 PCP: Renford Dills, MD    Brief Narrative:  74 year old gentleman with history of saddle pulmonary embolism on Xarelto, hypertension, diabetes and history of a stroke in 2020 brought to the emergency room as a code stroke.  Patient lives at home alone, he is quite ambulatory and independent.  His family checks on him.  He was found with right-sided facial droop and slurred speech with unknown last normal time.  Patient did have some confusion on presentation. In the emergency room, code stroke was called.  Not a TPA candidate because of unknown last normal as well as being on Xarelto.  Otherwise hemodynamically stable.  Assessment & Plan:   Principal Problem:   CVA (cerebral vascular accident) (HCC) Active Problems:   Hypercholesterolemia with hypertriglyceridemia   Hypothyroid   Cognitive dysfunction   Antiphospholipid syndrome (HCC)   Diabetes mellitus type 2, noninsulin dependent (HCC)   Obesity (BMI 30.0-34.9)   Acute ischemic stroke (HCC)   History of CVA (cerebrovascular accident)   History of pulmonary embolism   Tobacco abuse   Essential hypertension  Acute left ACA ischemic stroke: Clinical findings, dysarthria, confusion and right hemiparesis. CT head findings, no acute findings.  Severe temporal lobe atrophy.  Chronic small vessel disease. MRI of the brain, left ACA territory stroke.  CTA of the head and neck:Atherosclerotic disease, high-grade stenosis A3 ACA.  Calcified plaque at the ICA origin. 2D echocardiogram, normal.  No source of embolism. Antiplatelet therapy, on Xarelto and aspirin at home.  Neurology recommended changing anticoagulation regimen. Insurance not covering for Pradaxa, will change to Eliquis.  Once on Eliquis, discontinue aspirin due to no added benefit but increased risk of bleeding. LDL, 57 already on a statin.   Hemoglobin A1c, 6.1.  Patient on Metformin at home.   Will stay on insulin.  Has contrasted studies. DVT prophylaxis, Eliquis. Therapy recommendations, acute inpatient rehab.  Hypertension: We will resume his home medications today.  Gradual control with a.m. less than 140.  Hyperlipidemia: On Lipitor 10 mg.  LDL 57.  Appropriate.  History of saddle PE: As above.  Changing to Eliquis.    Dementia with behavioral disturbances: Delirium precautions.  Fall precautions.  If needed one-to-one sitter.  Encourage family to spend night. Discontinue all sedatives.  Will try melatonin at nighttime.   DVT prophylaxis: Eliquis. Code Status: Full code Family Communication: Patient's daughter at the bedside. Disposition Plan: Status is: Inpatient  Remains inpatient appropriate because:Inpatient level of care appropriate due to severity of illness   Dispo: The patient is from: Home              Anticipated d/c is to: CIR              Anticipated d/c date is: 1 day              Patient currently is medically stable to d/c.          Consultants:   Neurology  Procedures:   None  Antimicrobials:   None   Subjective: Patient seen and examined.  Daughter at the bedside.  He had some confusion and agitation last night.  He himself denies any complaints.  He is pleasantly confused. Objective: Vitals:   08/13/19 0034 08/13/19 0445 08/13/19 0900 08/13/19 1255  BP: 133/78 (!) 161/105 (!) 169/99 124/80  Pulse: 65 68 77 84  Resp: 18 18 18 18   Temp: 97.6 F (36.4 C) 97.9 F (  36.6 C) 98.2 F (36.8 C) 98.4 F (36.9 C)  TempSrc: Oral Oral Oral Oral  SpO2: 96%  97% 95%  Weight:      Height:        Intake/Output Summary (Last 24 hours) at 08/13/2019 1317 Last data filed at 08/13/2019 1257 Gross per 24 hour  Intake 1140 ml  Output 1950 ml  Net -810 ml   Filed Weights   08/10/19 1153 08/12/19 0654  Weight: 97.5 kg 97.6 kg    Examination:  General exam: Appears calm and comfortable, not in any distress.  Pleasant.  Confused.   Alert oriented x 1-2. Respiratory system: Clear to auscultation. Respiratory effort normal. Cardiovascular system: S1 & S2 heard, RRR. No JVD, murmurs, rubs, gallops or clicks. No pedal edema. Gastrointestinal system: Abdomen is nondistended, soft and nontender. No organomegaly or masses felt. Normal bowel sounds heard. Central nervous system: Alert and oriented 1-2.  Pleasantly confused. Speech is clear, slow  Generalized weakness.  No appreciable hemiparesis.    Data Reviewed: I have personally reviewed following labs and imaging studies  CBC: Recent Labs  Lab 08/10/19 0926 08/10/19 0927  WBC 10.9*  --   NEUTROABS 9.2*  --   HGB 14.8 15.3  HCT 45.4 45.0  MCV 91.2  --   PLT 197  --    Basic Metabolic Panel: Recent Labs  Lab 08/10/19 0926 08/10/19 0927  NA 136 137  K 4.3 4.5  CL 105 104  CO2 20*  --   GLUCOSE 139* 133*  BUN 12 15  CREATININE 0.96 0.70  CALCIUM 9.4  --    GFR: Estimated Creatinine Clearance: 90.6 mL/min (by C-G formula based on SCr of 0.7 mg/dL). Liver Function Tests: Recent Labs  Lab 08/10/19 0926  AST 24  ALT 19  ALKPHOS 51  BILITOT 0.8  PROT 7.4  ALBUMIN 4.1   No results for input(s): LIPASE, AMYLASE in the last 168 hours. No results for input(s): AMMONIA in the last 168 hours. Coagulation Profile: Recent Labs  Lab 08/10/19 0926  INR 1.4*   Cardiac Enzymes: No results for input(s): CKTOTAL, CKMB, CKMBINDEX, TROPONINI in the last 168 hours. BNP (last 3 results) No results for input(s): PROBNP in the last 8760 hours. HbA1C: Recent Labs    08/10/19 1526  HGBA1C 6.1*   CBG: Recent Labs  Lab 08/12/19 1135 08/12/19 1700 08/12/19 2136 08/13/19 0603 08/13/19 1253  GLUCAP 171* 146* 127* 100* 105*   Lipid Profile: Recent Labs    08/11/19 0404  CHOL 120  HDL 35*  LDLCALC 57  TRIG 139  CHOLHDL 3.4   Thyroid Function Tests: Recent Labs    08/10/19 1526  TSH 1.117   Anemia Panel: No results for input(s): VITAMINB12,  FOLATE, FERRITIN, TIBC, IRON, RETICCTPCT in the last 72 hours. Sepsis Labs: No results for input(s): PROCALCITON, LATICACIDVEN in the last 168 hours.  Recent Results (from the past 240 hour(s))  Respiratory Panel by RT PCR (Flu A&B, Covid) - Nasopharyngeal Swab     Status: None   Collection Time: 08/10/19  9:55 AM   Specimen: Nasopharyngeal Swab  Result Value Ref Range Status   SARS Coronavirus 2 by RT PCR NEGATIVE NEGATIVE Final    Comment: (NOTE) SARS-CoV-2 target nucleic acids are NOT DETECTED. The SARS-CoV-2 RNA is generally detectable in upper respiratoy specimens during the acute phase of infection. The lowest concentration of SARS-CoV-2 viral copies this assay can detect is 131 copies/mL. A negative result does not preclude SARS-Cov-2 infection  and should not be used as the sole basis for treatment or other patient management decisions. A negative result may occur with  improper specimen collection/handling, submission of specimen other than nasopharyngeal swab, presence of viral mutation(s) within the areas targeted by this assay, and inadequate number of viral copies (<131 copies/mL). A negative result must be combined with clinical observations, patient history, and epidemiological information. The expected result is Negative. Fact Sheet for Patients:  https://www.moore.com/ Fact Sheet for Healthcare Providers:  https://www.young.biz/ This test is not yet ap proved or cleared by the [place] FDA and  has been authorized for detection and/or diagnosis of SARS-CoV-2 by FDA under an Emergency Use Authorization (EUA). This EUA will remain  in effect (meaning this test can be used) for the duration of the COVID-19 declaration under Section 564(b)(1) of the Act, 21 U.S.C. section 360bbb-3(b)(1), unless the authorization is terminated or revoked sooner.    Influenza A by PCR NEGATIVE NEGATIVE Final   Influenza B by PCR NEGATIVE  NEGATIVE Final    Comment: (NOTE) The Xpert Xpress SARS-CoV-2/FLU/RSV assay is intended as an aid in  the diagnosis of influenza from Nasopharyngeal swab specimens and  should not be used as a sole basis for treatment. Nasal washings and  aspirates are unacceptable for Xpert Xpress SARS-CoV-2/FLU/RSV  testing. Fact Sheet for Patients: https://www.moore.com/ Fact Sheet for Healthcare Providers: https://www.young.biz/ This test is not yet approved or cleared by the [place] FDA and  has been authorized for detection and/or diagnosis of SARS-CoV-2 by  FDA under an Emergency Use Authorization (EUA). This EUA will remain  in effect (meaning this test can be used) for the duration of the  Covid-19 declaration under Section 564(b)(1) of the Act, 21  U.S.C. section 360bbb-3(b)(1), unless the authorization is  terminated or revoked. Performed at [facility], [address] [place], [place] [address]          Radiology Studies: No results found.      Scheduled Meds: . apixaban  5 mg Oral BID  . atorvastatin  40 mg Oral Daily  . docusate sodium  100 mg Oral BID  . feeding supplement (ENSURE ENLIVE)  237 mL Oral BID BM  . insulin aspart  0-15 Units Subcutaneous TID WC  . insulin aspart  0-5 Units Subcutaneous QHS  . irbesartan  75 mg Oral Daily  . levothyroxine  75 mcg Oral Daily  . melatonin  3 mg Oral QHS  . multivitamin with minerals  1 tablet Oral Daily   Continuous Infusions:    LOS: 3 days    Time spent: 25 minutes    Dorcas Carrow, MD [facility] Pager [phone]

## 2019-08-14 DIAGNOSIS — E119 Type 2 diabetes mellitus without complications: Secondary | ICD-10-CM | POA: Diagnosis not present

## 2019-08-14 LAB — GLUCOSE, CAPILLARY
Glucose-Capillary: 101 mg/dL — ABNORMAL HIGH (ref 70–99)
Glucose-Capillary: 130 mg/dL — ABNORMAL HIGH (ref 70–99)
Glucose-Capillary: 154 mg/dL — ABNORMAL HIGH (ref 70–99)
Glucose-Capillary: 156 mg/dL — ABNORMAL HIGH (ref 70–99)

## 2019-08-14 NOTE — Progress Notes (Addendum)
Inpatient Rehabilitation Admissions Coordinator  I met at bedside with patient and his daughter. We discussed goals and expectations of a possible inpt rehab admit. Daughter prefers a CIR admit before returning home with family providing 24/7 assist at d/c. I will begin insurance authorization with [facility] medicare.  Danne Baxter, RN, MSN Rehab Admissions Coordinator [phone] 08/14/2019 11:42 AM

## 2019-08-14 NOTE — H&P (Addendum)
Physical Medicine and Rehabilitation Admission H&P    Chief Complaint  Patient presents with  . Code Stroke  : HPI: Roger Ramirez is a 74 year old right-handed male with history of saddle pulmonary emboli from presumed antiphospholipid antibody syndrome maintained on Xarelto,  tobacco abuse, hypertension, diabetes mellitus, CVA 2020 with reported cognitive deficits. History taken from chart review and daughter due to processing. Patient lives alone independent prior to admission.  He has a son and daughter in the area who check on him as needed and prepare meals.  1 level home 6 steps to entry. He presented on 4/59/21 with right hemiparesis, dysarthria, and AMS. Cranial CT scan unremarkable for acute intracranial process. MRI showed acute infarction affecting the left body of the corpus callosum.  CT angiogram head and neck with atherosclerotic irregularity of the left A3 ACA with high-grade stenosis with suspected subsequent occlusion. Echocardiogram with ejection fraction of 65%. Patient did not receive TPA.  Follow-up MRI completed 08/15/2019 showed progression on left anterior cerebral artery infarction  since the MRI of 08/10/2019 no associated hemorrhage.  Neurology consulted with follow-up and currently on Eliquis for CVA prophylaxis.  Tolerating a regular diet.  Therapy evaluations completed and patient was admitted for a comprehensive rehab program. Please see preadmission assessment from earlier today as well.  Review of Systems  Constitutional: Negative for chills and fever.  HENT: Negative for hearing loss.   Eyes: Negative for blurred vision and double vision.  Respiratory: Negative for cough and shortness of breath.   Cardiovascular: Negative for chest pain, palpitations and leg swelling.  Gastrointestinal: Positive for constipation. Negative for heartburn, nausea and vomiting.  Genitourinary: Positive for urgency. Negative for dysuria, flank pain and hematuria.  Musculoskeletal:  Positive for myalgias.  Skin: Negative for rash.  Neurological: Positive for speech change and focal weakness. Negative for sensory change.  Psychiatric/Behavioral: Positive for memory loss. The patient has insomnia.    Past Medical History:  Diagnosis Date  . Antiphospholipid syndrome ([place])   . CVA (cerebral vascular accident) ([place]) 06/07/2019  . Diabetes mellitus without complication ([place])   . Hypertension   . Pulmonary embolism ([facility])    No pertinent surgical history related to stroke. Family History  Problem Relation Age of Onset  . Hypertension Mother   . Hypertension Father   . Dementia Maternal Grandmother   . Stroke Maternal Grandmother    Social History:  reports that he has quit smoking. His smoking use included cigarettes. He has a 20.00 pack-year smoking history. He has never used smokeless tobacco. He reports previous alcohol use. He reports that he does not use drugs. Allergies: No Known Allergies Medications Prior to Admission  Medication Sig Dispense Refill  . ACCU-CHEK AVIVA PLUS test strip 1 each by Other route daily.     . Accu-Chek FastClix Lancets MISC 1 each by Other route in the morning, at noon, in the evening, and at bedtime.     Marland Kitchen aspirin EC 81 MG tablet Take 81 mg by mouth daily.    Marland Kitchen atorvastatin (LIPITOR) 10 MG tablet Take 10 mg by mouth daily.    . blood glucose meter kit and supplies KIT Dispense based on patient and insurance preference. Use up to four times daily as directed. (FOR ICD-9 250.00, 250.01). (Patient taking differently: 1 each by Other route See admin instructions. Dispense based on patient and insurance preference. Use up to four times daily as directed. (FOR ICD-9 250.00, 250.01).) 1 each 0  . irbesartan (AVAPRO) 150  MG tablet Take 75 mg by mouth daily.    Marland Kitchen levothyroxine (SYNTHROID) 75 MCG tablet Take 75 mcg by mouth daily.    . metFORMIN (GLUCOPHAGE) 1000 MG tablet Take 1,000 mg by mouth 2 (two) times daily.    . Omega-3 Fatty Acids  (FISH OIL) 1200 MG CAPS Take 1,200 mg by mouth daily.    . rivaroxaban (XARELTO) 20 MG TABS tablet Take 1 tablet (20 mg total) by mouth daily. 90 tablet 3  . vitamin B-12 1000 MCG tablet Take 1 tablet (1,000 mcg total) by mouth daily. 30 tablet 0    Drug Regimen Review Drug regimen was reviewed and remains appropriate with no significant issues identified  Home: Home Living Family/patient expects to be discharged to:: Private residence Living Arrangements: Alone(girlfriend came on wekeends and family checked in on him dai) Available Help at Discharge: Family, Available 24 hours/day(daughter, son, and granddaughter to asist with 24/7 as neede) Type of Home: House Home Access: Stairs to enter Technical brewer of Steps: 5 Entrance Stairs-Rails: Right, Left Home Layout: One level Bathroom Shower/Tub: Optometrist: Yes  Lives With: Alone   Functional History: Prior Function Level of Independence: Independent Comments: had recovered gait to walk with no AD outside after first CVA  Functional Status:  Mobility: Bed Mobility Overal bed mobility: Needs Assistance Bed Mobility: Sit to Supine, Rolling, Supine to Sit Rolling: Min assist Supine to sit: Max assist, HOB elevated Sit to supine: +2 for physical assistance, Total assist General bed mobility comments: pt initiated movement to EOB; requires max A and multimodal cues for supine to sit with use of rail; total A +2 to return to bed and granddaughter (works nights on 3W) assisted as pt was sliding off EOB after attempt to stand Transfers Overall transfer level: Needs assistance Equipment used: Rolling walker (2 wheeled) Transfers: Sit to/from Stand Sit to Stand: Max assist, From elevated surface General transfer comment: pt unable to stand despite cues and assistance  Ambulation/Gait Ambulation/Gait assistance: Min assist, Mod assist Gait Distance (Feet): 60 Feet  Assistive device: Rolling walker (2 wheeled) Gait Pattern/deviations: Step-through pattern, Decreased dorsiflexion - right, Decreased stance time - right, Trunk flexed General Gait Details: Pt required cues for R foot height and step length.  LOB with L turn required moderate assistance to correct. Gait velocity: reduced Gait velocity interpretation: <1.31 ft/sec, indicative of household ambulator    ADL: ADL Overall ADL's : Needs assistance/impaired Eating/Feeding: Minimal assistance, Bed level Grooming: Moderate assistance, Bed level Upper Body Bathing: Maximal assistance, Bed level Lower Body Bathing: Maximal assistance, +2 for physical assistance, Bed level Upper Body Dressing : Moderate assistance, Bed level Lower Body Dressing: Maximal assistance, +2 for physical assistance, Bed level Functional mobility during ADLs: Maximal assistance, +2 for physical assistance, Rolling walker General ADL Comments: session focus on functional sit<>stands and sitting balance; dtr reports pt had ambien likely affecting his level of function as pt required MAX A +2 to sit<>stand briefly from EOB. Pt able to sit EOB ~ 2 mins BUE support and MIN A  Cognition: Cognition Overall Cognitive Status: Impaired/Different from baseline Arousal/Alertness: Awake/alert Orientation Level: Disoriented to time, Oriented to person, Oriented to place, Oriented to situation Attention: Focused Focused Attention: Impaired Focused Attention Impairment: Verbal basic Memory: Impaired Memory Impairment: Decreased recall of new information, Decreased short term memory Decreased Short Term Memory: Verbal basic Immediate Memory Recall: Sock, Blue, Bed Memory Recall Sock: Without Cue Memory Recall Blue: Without Cue Memory Recall Bed:  With Cue Awareness: Appears intact Problem Solving: Impaired Problem Solving Impairment: Functional basic, Functional complex, Verbal complex Safety/Judgment: Impaired Cognition  Arousal/Alertness: Awake/alert Behavior During Therapy: Flat affect Overall Cognitive Status: Impaired/Different from baseline Area of Impairment: Problem solving, Awareness, Safety/judgement, Attention, Following commands Current Attention Level: Sustained Memory: Decreased short-term memory Following Commands: Follows one step commands inconsistently, Follows one step commands with increased time Safety/Judgement: Decreased awareness of safety, Decreased awareness of deficits Awareness: Intellectual Problem Solving: Requires verbal cues, Requires tactile cues, Difficulty sequencing, Decreased initiation, Slow processing General Comments: distracted by TV so turned it off and pt more focused on mobility  Physical Exam: Blood pressure 120/67, pulse 78, temperature 97.9 F (36.6 C), temperature source Oral, resp. rate 20, height 5' 9"  (1.753 m), weight 97.6 kg, SpO2 96 %. Physical Exam  Vitals reviewed. Constitutional: He appears well-developed and well-nourished.  HENT:  Head: Normocephalic and atraumatic.  Eyes: EOM are normal. Right eye exhibits no discharge.  Neck: No tracheal deviation present. No thyromegaly present.  Respiratory: Effort normal. No stridor. No respiratory distress.  GI: Soft. He exhibits no distension.  Musculoskeletal:     Comments: No edema or tenderness in extremities  Neurological: He is alert.  Dysarthria  He does display some decrease in attention and limited awareness.   Follows commands.   Oriented to person and place. Motor: Left upper extremity: 4+/5 proximal distal Left lower extremity: 4+/5 proximal distal Right upper extremity: 3+/5 proximal distal Right lower extremity: Hip flexion, knee extension 2/5, ankle dorsiflexion 3/5 with apraxia HOH  Skin: Skin is warm and dry.  Psychiatric: His affect is blunt. His speech is delayed. He is slowed.    Results for orders placed or performed during the hospital encounter of 08/10/19 (from the past 48  hour(s))  Glucose, capillary     Status: Abnormal   Collection Time: 08/14/19  4:29 PM  Result Value Ref Range   Glucose-Capillary 154 (H) 70 - 99 mg/dL    Comment: Glucose reference range applies only to samples taken after fasting for at least 8 hours.  Glucose, capillary     Status: Abnormal   Collection Time: 08/14/19  9:08 PM  Result Value Ref Range   Glucose-Capillary 156 (H) 70 - 99 mg/dL    Comment: Glucose reference range applies only to samples taken after fasting for at least 8 hours.   Comment 1 Notify RN    Comment 2 Document in Chart   Glucose, capillary     Status: Abnormal   Collection Time: 08/15/19  6:13 AM  Result Value Ref Range   Glucose-Capillary 121 (H) 70 - 99 mg/dL    Comment: Glucose reference range applies only to samples taken after fasting for at least 8 hours.   Comment 1 Notify RN    Comment 2 Document in Chart   Glucose, capillary     Status: Abnormal   Collection Time: 08/15/19 11:38 AM  Result Value Ref Range   Glucose-Capillary 120 (H) 70 - 99 mg/dL    Comment: Glucose reference range applies only to samples taken after fasting for at least 8 hours.  Glucose, capillary     Status: Abnormal   Collection Time: 08/15/19  3:59 PM  Result Value Ref Range   Glucose-Capillary 125 (H) 70 - 99 mg/dL    Comment: Glucose reference range applies only to samples taken after fasting for at least 8 hours.  Glucose, capillary     Status: Abnormal   Collection Time: 08/15/19  9:33  PM  Result Value Ref Range   Glucose-Capillary 137 (H) 70 - 99 mg/dL    Comment: Glucose reference range applies only to samples taken after fasting for at least 8 hours.   Comment 1 Notify RN    Comment 2 Document in Chart   CBC     Status: None   Collection Time: 08/16/19  5:31 AM  Result Value Ref Range   WBC 8.6 4.0 - 10.5 K/uL   RBC 4.77 4.22 - 5.81 MIL/uL   Hemoglobin 14.6 13.0 - 17.0 g/dL   HCT 43.4 39.0 - 52.0 %   MCV 91.0 80.0 - 100.0 fL   MCH 30.6 26.0 - 34.0 pg    MCHC 33.6 30.0 - 36.0 g/dL   RDW 13.5 11.5 - 15.5 %   Platelets 212 150 - 400 K/uL   nRBC 0.0 0.0 - 0.2 %    Comment: Performed at [facility], [place] [address] [place], [place] [address]  Basic metabolic panel     Status: Abnormal   Collection Time: 08/16/19  5:31 AM  Result Value Ref Range   Sodium 133 (L) 135 - 145 mmol/L   Potassium 4.1 3.5 - 5.1 mmol/L   Chloride 103 98 - 111 mmol/L   CO2 22 22 - 32 mmol/L   Glucose, Bld 123 (H) 70 - 99 mg/dL    Comment: Glucose reference range applies only to samples taken after fasting for at least 8 hours.   BUN 18 8 - 23 mg/dL   Creatinine, Ser 0.81 0.61 - 1.24 mg/dL   Calcium 9.1 8.9 - 10.3 mg/dL   GFR calc non Af Amer >60 >60 mL/min   GFR calc Af Amer >60 >60 mL/min   Anion gap 8 5 - 15    Comment: Performed at [facility] [address] [place], [place] [address]  Glucose, capillary     Status: Abnormal   Collection Time: 08/16/19  6:06 AM  Result Value Ref Range   Glucose-Capillary 112 (H) 70 - 99 mg/dL    Comment: Glucose reference range applies only to samples taken after fasting for at least 8 hours.   Comment 1 Notify RN    Comment 2 Document in Chart   Glucose, capillary     Status: Abnormal   Collection Time: 08/16/19 11:32 AM  Result Value Ref Range   Glucose-Capillary 181 (H) 70 - 99 mg/dL    Comment: Glucose reference range applies only to samples taken after fasting for at least 8 hours.   Comment 1 Notify RN    Comment 2 Document in Chart    MR BRAIN WO CONTRAST  Result Date: 08/15/2019 CLINICAL DATA:  Stroke with worsening symptoms.  Recent stroke. EXAM: MRI HEAD WITHOUT CONTRAST TECHNIQUE: Multiplanar, multiecho pulse sequences of the brain and surrounding structures were obtained without intravenous contrast. COMPARISON:  MRI 08/10/2019. FINDINGS: Brain: Interval extension of left anterior cerebral artery territory infarct. Large area of infarct now in the body of the corpus callosum on the left. There is  also infarct in the left posterior frontal medial lobe compatible with distal left MCA infarct. Note is made of severe stenosis left A3 segment on CTA. Generalized atrophy which is severe in the temporal lobes. Ventricular enlargement consistent with volume loss in the brain. Negative for hemorrhage or mass lesion. Vascular: Normal arterial flow voids Skull and upper cervical spine: No focal skeletal lesion. Sinuses/Orbits: Paranasal sinuses clear.  No orbital lesion Other: None IMPRESSION:  Left anterior cerebral artery territory infarct has progressed since the recent MRI of 08/10/2019. No associated hemorrhage Generalized atrophy which is severe in the temporal lobes. Chronic microvascular ischemic change in the white matter. Electronically Signed   By: Franchot Gallo M.D.   On: 08/15/2019 18:37       Medical Problem List and Plan: 1.  Right side weakness with dysarthria secondary to left ACA territory acute infarction as well as history of CVA 2020 with cognitive deficits  -patient may shower  -ELOS/Goals: 16-19 days/min/Mod a  Admit to CIR 2.  Antithrombotics: -DVT/anticoagulation: Eliquis  -antiplatelet therapy: N/A 3. Pain Management: Tylenol as needed 4. Mood: Melatonin 3 mg nightly  -antipsychotic agents: N/A 5. Neuropsych: This patient is capable of making decisions on his own behalf. 6. Skin/Wound Care: Routine skin checks 7. Fluids/Electrolytes/Nutrition: Routine in and outs. CMP ordered. 8.  History of saddle pulmonary emboli.  Continue Eliquis 9.  Diabetes mellitus.  Hemoglobin A1c 6.1.  SSI.  Patient on Glucophage 1000 mg twice daily prior to admission.  Resume as needed  Monitor with increased mobility 10.  Hypertension.  Avapro 75 mg daily.    Monitor with increased mobility 11.  Hypothyroidism. Continue Synthroid 12.  Hyperlipidemia. Lipitor 13.  History of tobacco abuse.  Counseling  Lavon Paganini Gilman, PA-C 08/16/2019  I have personally performed a face to face  diagnostic evaluation, including, but not limited to relevant history and physical exam findings, of this patient and developed relevant assessment and plan.  Additionally, I have reviewed and concur with the physician assistant's documentation above.  Delice Lesch, MD, ABPMR

## 2019-08-14 NOTE — Progress Notes (Signed)
PROGRESS NOTE    Roger Ramirez  DXA:[id number] DOB: [birth year] DOA: 08/10/2019 PCP: Renford Dills, MD    Brief Narrative:  74 year old gentleman with history of saddle pulmonary embolism on Xarelto, hypertension, diabetes and history of stroke in 2020 brought to the emergency room as a code stroke.  Patient lives at home alone, he is quite ambulatory and independent.  His family check on him.  He was found with right-sided facial droop and slurred speech with unknown last normal time.  Patient did have some confusion on presentation. In the emergency room, code stroke was called.  Not a TPA candidate because of unknown last normal as well as being on Xarelto.  Otherwise hemodynamically stable.  Assessment & Plan:   Principal Problem:   CVA (cerebral vascular accident) (HCC) Active Problems:   Hypercholesterolemia with hypertriglyceridemia   Hypothyroid   Cognitive dysfunction   Antiphospholipid syndrome (HCC)   Diabetes mellitus type 2, noninsulin dependent (HCC)   Obesity (BMI 30.0-34.9)   Acute ischemic stroke (HCC)   History of CVA (cerebrovascular accident)   History of pulmonary embolism   Tobacco abuse   Essential hypertension  Acute left ACA ischemic stroke: Clinical findings, dysarthria, confusion and right hemiparesis. CT head findings, no acute findings.  Severe temporal lobe atrophy.  Chronic small vessel disease. MRI of the brain, left ACA territory stroke.  CTA of the head and neck:Atherosclerotic disease, high-grade stenosis A3 ACA.  Calcified plaque at the ICA origin. 2D echocardiogram, normal.  No source of embolism. Antiplatelet therapy, on Xarelto and aspirin at home.  Neurology recommended changing anticoagulation regimen. Insurance not covering for Pradaxa, will change to Eliquis.  Once on Eliquis, discontinue aspirin due to no added benefit but increased risk of bleeding. LDL, 57 already on a statin.   Hemoglobin A1c, 6.1.  Patient on Metformin at home.   Will stay on insulin.  Has contrasted studies. DVT prophylaxis, Eliquis. Therapy recommendations, acute inpatient rehab.  Hypertension: Blood pressures acceptable.    Hyperlipidemia: On Lipitor 10 mg.  LDL 57.  Appropriate.  History of saddle PE: As above.  Now on Eliquis..    Dementia with behavioral disturbances: Delirium precautions.  Fall precautions.  If needed one-to-one sitter.  Encourage family to spend night. Discontinue all sedatives.   Did tolerate melatonin well last night.   DVT prophylaxis: Eliquis. Code Status: Full code Family Communication: Patient's daughter and granddaughter at the bedside. Disposition Plan: Status is: Inpatient  Remains inpatient appropriate because:Inpatient level of care appropriate due to severity of illness   Dispo: The patient is from: Home              Anticipated d/c is to: CIR              Anticipated d/c date is: 1 day              Patient currently is medically stable to d/c.          Consultants:   Neurology  Procedures:   None  Antimicrobials:   None   Subjective: Patient seen and examined.  No overnight events.  He is more sleepy after dose of melatonin last night.  Daughter and granddaughter at the bedside. Objective: Vitals:   08/13/19 1655 08/13/19 1952 08/13/19 2355 08/14/19 0727  BP: (!) 160/93 106/67 102/60 123/68  Pulse: 86 95 60 (!) 52  Resp: 18 19 17 18   Temp: 97.7 F (36.5 C) 98.3 F (36.8 C) 98 F (36.7 C) 98.3 F (36.8 C)  TempSrc: Oral Oral Oral Oral  SpO2: 96% 96% 92% 96%  Weight:      Height:        Intake/Output Summary (Last 24 hours) at 08/14/2019 1053 Last data filed at 08/14/2019 0900 Gross per 24 hour  Intake 360 ml  Output 650 ml  Net -290 ml   Filed Weights   08/10/19 1153 08/12/19 0654  Weight: 97.5 kg 97.6 kg    Examination:  General exam: Appears calm and comfortable, not in any distress.  Pleasant.  Confused.  Alert oriented x 1-2. Respiratory system: Clear to  auscultation. Respiratory effort normal. Cardiovascular system: S1 & S2 heard, RRR. No JVD, murmurs, rubs, gallops or clicks. No pedal edema. Gastrointestinal system: Abdomen is nondistended, soft and nontender. No organomegaly or masses felt. Normal bowel sounds heard. Central nervous system: Alert and oriented 1-2.  Pleasantly confused. Speech is clear, slow  Generalized weakness.  No appreciable hemiparesis.    Data Reviewed: I have personally reviewed following labs and imaging studies  CBC: Recent Labs  Lab 08/10/19 0926 08/10/19 0927  WBC 10.9*  --   NEUTROABS 9.2*  --   HGB 14.8 15.3  HCT 45.4 45.0  MCV 91.2  --   PLT 197  --    Basic Metabolic Panel: Recent Labs  Lab 08/10/19 0926 08/10/19 0927  NA 136 137  K 4.3 4.5  CL 105 104  CO2 20*  --   GLUCOSE 139* 133*  BUN 12 15  CREATININE 0.96 0.70  CALCIUM 9.4  --    GFR: Estimated Creatinine Clearance: 90.6 mL/min (by C-G formula based on SCr of 0.7 mg/dL). Liver Function Tests: Recent Labs  Lab 08/10/19 0926  AST 24  ALT 19  ALKPHOS 51  BILITOT 0.8  PROT 7.4  ALBUMIN 4.1   No results for input(s): LIPASE, AMYLASE in the last 168 hours. No results for input(s): AMMONIA in the last 168 hours. Coagulation Profile: Recent Labs  Lab 08/10/19 0926  INR 1.4*   Cardiac Enzymes: No results for input(s): CKTOTAL, CKMB, CKMBINDEX, TROPONINI in the last 168 hours. BNP (last 3 results) No results for input(s): PROBNP in the last 8760 hours. HbA1C: No results for input(s): HGBA1C in the last 72 hours. CBG: Recent Labs  Lab 08/12/19 2136 08/13/19 0603 08/13/19 1253 08/13/19 1653 08/14/19 0621  GLUCAP 127* 100* 105* 120* 101*   Lipid Profile: No results for input(s): CHOL, HDL, LDLCALC, TRIG, CHOLHDL, LDLDIRECT in the last 72 hours. Thyroid Function Tests: No results for input(s): TSH, T4TOTAL, FREET4, T3FREE, THYROIDAB in the last 72 hours. Anemia Panel: No results for input(s): VITAMINB12,  FOLATE, FERRITIN, TIBC, IRON, RETICCTPCT in the last 72 hours. Sepsis Labs: No results for input(s): PROCALCITON, LATICACIDVEN in the last 168 hours.  Recent Results (from the past 240 hour(s))  Respiratory Panel by RT PCR (Flu A&B, Covid) - Nasopharyngeal Swab     Status: None   Collection Time: 08/10/19  9:55 AM   Specimen: Nasopharyngeal Swab  Result Value Ref Range Status   SARS Coronavirus 2 by RT PCR NEGATIVE NEGATIVE Final    Comment: (NOTE) SARS-CoV-2 target nucleic acids are NOT DETECTED. The SARS-CoV-2 RNA is generally detectable in upper respiratoy specimens during the acute phase of infection. The lowest concentration of SARS-CoV-2 viral copies this assay can detect is 131 copies/mL. A negative result does not preclude SARS-Cov-2 infection and should not be used as the sole basis for treatment or other patient management decisions. A negative result may  occur with  improper specimen collection/handling, submission of specimen other than nasopharyngeal swab, presence of viral mutation(s) within the areas targeted by this assay, and inadequate number of viral copies (<131 copies/mL). A negative result must be combined with clinical observations, patient history, and epidemiological information. The expected result is Negative. Fact Sheet for Patients:  PinkCheek.be Fact Sheet for Healthcare Providers:  GravelBags.it This test is not yet ap proved or cleared by the [place] FDA and  has been authorized for detection and/or diagnosis of SARS-CoV-2 by FDA under an Emergency Use Authorization (EUA). This EUA will remain  in effect (meaning this test can be used) for the duration of the COVID-19 declaration under Section 564(b)(1) of the Act, 21 U.S.C. section 360bbb-3(b)(1), unless the authorization is terminated or revoked sooner.    Influenza A by PCR NEGATIVE NEGATIVE Final   Influenza B by PCR NEGATIVE  NEGATIVE Final    Comment: (NOTE) The Xpert Xpress SARS-CoV-2/FLU/RSV assay is intended as an aid in  the diagnosis of influenza from Nasopharyngeal swab specimens and  should not be used as a sole basis for treatment. Nasal washings and  aspirates are unacceptable for Xpert Xpress SARS-CoV-2/FLU/RSV  testing. Fact Sheet for Patients: PinkCheek.be Fact Sheet for Healthcare Providers: GravelBags.it This test is not yet approved or cleared by the [place] FDA and  has been authorized for detection and/or diagnosis of SARS-CoV-2 by  FDA under an Emergency Use Authorization (EUA). This EUA will remain  in effect (meaning this test can be used) for the duration of the  Covid-19 declaration under Section 564(b)(1) of the Act, 21  U.S.C. section 360bbb-3(b)(1), unless the authorization is  terminated or revoked. Performed at [facility], [address], [place], [place] [address]          Radiology Studies: No results found.      Scheduled Meds: . apixaban  5 mg Oral BID  . atorvastatin  40 mg Oral Daily  . docusate sodium  100 mg Oral BID  . feeding supplement (ENSURE ENLIVE)  237 mL Oral BID BM  . insulin aspart  0-15 Units Subcutaneous TID WC  . insulin aspart  0-5 Units Subcutaneous QHS  . irbesartan  75 mg Oral Daily  . levothyroxine  75 mcg Oral Daily  . melatonin  3 mg Oral QHS  . multivitamin with minerals  1 tablet Oral Daily   Continuous Infusions:    LOS: 4 days    Time spent: 25 minutes    Barb Merino, MD [facility] Pager [phone]

## 2019-08-14 NOTE — Progress Notes (Signed)
Occupational Therapy Treatment Patient Details Name: Derrek Puff MRN: [id number] DOB: 1945-07-01 Today's Date: 08/14/2019    History of present illness 74 year old gentleman with history of saddle pulmonary embolism on Xarelto, hypertension, diabetes and history of a stroke in 2020 brought to the emergency room as a code stroke. MRI of the brain revealed left ACA territory stroke   OT comments  Pt making gradual progress towards OT goals this session. Overall, pt required MAX A for bed mobililty and MIN- MOD A for sitting balance with BUEs supported. Pt required MAX A +2 to sit<>stand briefly from EOB with RW. Pt only able to stand ~ 30 sec before needing to return to supine d/t fatigue. Pts dtr report pt had ambien likely contributing to pts limited activity tolerance. Continue to recommend CIR at time of DC, will continue to follow acutely per POC.    Follow Up Recommendations  CIR    Equipment Recommendations  Other (comment)(defer to next venue of care)    Recommendations for Other Services      Precautions / Restrictions Precautions Precautions: Fall Precaution Comments: heavy posterior lean sitting EOB Restrictions Weight Bearing Restrictions: No       Mobility Bed Mobility Overal bed mobility: Needs Assistance Bed Mobility: Supine to Sit;Sit to Supine     Supine to sit: Max assist;HOB elevated Sit to supine: Max assist;+2 for physical assistance   General bed mobility comments: pt initially able to advance BLEs to EOB but needed MAX A overall to advance BLEs to EOB and heavy MAX A to elevate trunk. MAX A +2 to return to supine d/t fatigue  Transfers Overall transfer level: Needs assistance Equipment used: Rolling walker (2 wheeled) Transfers: Sit to/from Stand Sit to Stand: Max assist;+2 physical assistance         General transfer comment: MAX A +2 for pt to power into standing from EOB; Pt only able to stand ~ 30 secs before fatiguing and needing to return to  supine; pt not following commands for hand position despite MAX cues.    Balance Overall balance assessment: Needs assistance Sitting-balance support: Bilateral upper extremity supported;Feet supported Sitting balance-Leahy Scale: Poor Sitting balance - Comments: needing at least MIN A to maintain upright posture with BUEs supported   Standing balance support: Bilateral upper extremity supported Standing balance-Leahy Scale: Poor Standing balance comment: reliant on BUE support and external assist                           ADL either performed or assessed with clinical judgement   ADL Overall ADL's : Needs assistance/impaired                                     Functional mobility during ADLs: Maximal assistance;+2 for physical assistance;Rolling walker General ADL Comments: session focus on functional sit<>stands and sitting balance; dtr reports pt had ambien likely affecting his level of function as pt required MAX A +2 to sit<>stand briefly from EOB. Pt able to sit EOB ~ 2 mins BUE support and MIN A     Vision Baseline Vision/History: Wears glasses Wears Glasses: Reading only     Perception     Praxis      Cognition Arousal/Alertness: Awake/alert Behavior During Therapy: Flat affect Overall Cognitive Status: Impaired/Different from baseline Area of Impairment: Problem solving;Awareness;Safety/judgement;Following commands;Attention  Current Attention Level: Focused   Following Commands: Follows one step commands inconsistently Safety/Judgement: Decreased awareness of safety;Decreased awareness of deficits Awareness: Intellectual Problem Solving: Slow processing;Requires verbal cues;Requires tactile cues General Comments: pt slow to process overall needing increased time and effort to follow commands        Exercises     Shoulder Instructions       General Comments pt dtr present during session supportive and  helpful    Pertinent Vitals/ Pain       Pain Assessment: No/denies pain  Home Living                                          Prior Functioning/Environment              Frequency  Min 2X/week        Progress Toward Goals  OT Goals(current goals can now be found in the care plan section)  Progress towards OT goals: Progressing toward goals  Acute Rehab OT Goals Patient Stated Goal: family wants CIR  OT Goal Formulation: With patient/family Time For Goal Achievement: 08/25/19 Potential to Achieve Goals: Good  Plan Discharge plan remains appropriate    Co-evaluation                 AM-PAC OT "6 Clicks" Daily Activity     Outcome Measure   Help from another person eating meals?: A Little Help from another person taking care of personal grooming?: A Lot Help from another person toileting, which includes using toliet, bedpan, or urinal?: Total Help from another person bathing (including washing, rinsing, drying)?: Total Help from another person to put on and taking off regular upper body clothing?: A Lot Help from another person to put on and taking off regular lower body clothing?: Total 6 Click Score: 10    End of Session Equipment Utilized During Treatment: Gait belt;Rolling walker  OT Visit Diagnosis: Unsteadiness on feet (R26.81);Muscle weakness (generalized) (M62.81);Other symptoms and signs involving cognitive function;Other symptoms and signs involving the nervous system (R29.898);Cognitive communication deficit (R41.841);Hemiplegia and hemiparesis   Activity Tolerance Patient limited by fatigue;Patient limited by lethargy;Other (comment)(likely d/t ambien)   Patient Left in bed;with call bell/phone within reach;with bed alarm set;with family/visitor present   Nurse Communication          Time: 5409-8119 OT Time Calculation (min): 25 min  Charges: OT General Charges $OT Visit: 1 Visit OT Treatments $Therapeutic Activity:  23-37 mins  Lanier Clam., COTA/L Acute Rehabilitation Services [phone] [place] 08/14/2019, 1:08 PM

## 2019-08-15 ENCOUNTER — Inpatient Hospital Stay (HOSPITAL_COMMUNITY): Payer: Medicare Other

## 2019-08-15 DIAGNOSIS — E119 Type 2 diabetes mellitus without complications: Secondary | ICD-10-CM | POA: Diagnosis not present

## 2019-08-15 LAB — GLUCOSE, CAPILLARY
Glucose-Capillary: 121 mg/dL — ABNORMAL HIGH (ref 70–99)
Glucose-Capillary: 120 mg/dL — ABNORMAL HIGH (ref 70–99)
Glucose-Capillary: 125 mg/dL — ABNORMAL HIGH (ref 70–99)
Glucose-Capillary: 137 mg/dL — ABNORMAL HIGH (ref 70–99)

## 2019-08-15 MED ORDER — MELATONIN 3 MG PO TABS
3.0000 mg | ORAL_TABLET | Freq: Every day | ORAL | Status: DC
  Administered 2019-08-15: 3 mg via ORAL
  Filled 2019-08-15: qty 1

## 2019-08-15 NOTE — Progress Notes (Signed)
PROGRESS NOTE    Roger Ramirez  JOA:[id number] DOB: 07/29/1945 DOA: 08/10/2019 PCP: Renford Dills, MD    Brief Narrative:  74 year old gentleman with history of saddle pulmonary embolism on Xarelto, hypertension, diabetes and history of stroke in 2020 brought to the emergency room as a code stroke.  Patient lives at home alone, he is quite ambulatory and independent.  His family check on him.  He was found with right-sided facial droop and slurred speech with unknown last normal time.  Patient did have some confusion on presentation.  In the emergency room, code stroke was called.  Not a TPA candidate because of unknown last normal as well as being on Xarelto.  Otherwise hemodynamically stable.  Assessment & Plan:   Principal Problem:   CVA (cerebral vascular accident) (HCC) Active Problems:   Hypercholesterolemia with hypertriglyceridemia   Hypothyroid   Cognitive dysfunction   Antiphospholipid syndrome (HCC)   Diabetes mellitus type 2, noninsulin dependent (HCC)   Obesity (BMI 30.0-34.9)   Acute ischemic stroke (HCC)   History of CVA (cerebrovascular accident)   History of pulmonary embolism   Tobacco abuse   Essential hypertension  Acute left ACA ischemic stroke: Clinical findings, dysarthria, confusion and right hemiparesis. CT head findings, no acute findings.  Severe temporal lobe atrophy.  Chronic small vessel disease. MRI of the brain, left ACA territory stroke.  CTA of the head and neck:Atherosclerotic disease, high-grade stenosis A3 ACA.  Calcified plaque at the ICA origin. 2D echocardiogram, normal.  No source of embolism. Antiplatelet therapy, on Xarelto and aspirin at home.  Neurology recommended changing anticoagulation regimen. Insurance not covering for Pradaxa, will change to Eliquis.  Once on Eliquis, discontinue aspirin due to no added benefit but increased risk of bleeding. LDL, 57 already on a statin.   Hemoglobin A1c, 6.1.  Patient on Metformin at home.   Will stay on insulin.  Has contrasted studies. Resume metformin on discharge. DVT prophylaxis, Eliquis. Therapy recommendations, acute inpatient rehab.  Hypertension: Blood pressures acceptable.    Hyperlipidemia: On Lipitor 10 mg.  LDL 57.  Appropriate.  History of saddle PE: As above.  Now on Eliquis..    Dementia with behavioral disturbances: Delirium precautions.  Fall precautions.  If needed one-to-one sitter.  Encourage family to spend night. Discontinue all sedatives.   Did tolerate melatonin well last night. Excessively sleepy, reduce melatonin to 2 mg at night.   DVT prophylaxis: Eliquis. Code Status: Full code Family Communication: Patient's son at bedside.  Disposition Plan: Status is: Inpatient  Remains inpatient appropriate because:Inpatient level of care appropriate due to severity of illness   Dispo: The patient is from: Home              Anticipated d/c is to: CIR              Anticipated d/c date is: 1 day              Patient currently is medically stable to d/c.          Consultants:   Neurology  Procedures:   None  Antimicrobials:   None   Subjective: Patient seen and examined.  No overnight events.  Slept all night and through the morning.  Son at the bedside.   Objective: Vitals:   08/14/19 2315 08/15/19 0316 08/15/19 0816 08/15/19 1141  BP: 125/80 (!) 151/68 (!) 151/67 (!) 146/72  Pulse: (!) 58 (!) 57 (!) 52 68  Resp: 16 17 16 18   Temp: 98.1 F (36.7  C) 97.6 F (36.4 C) 97.7 F (36.5 C) (!) 97.5 F (36.4 C)  TempSrc:  Oral Oral Oral  SpO2: 95% 96% 98% 98%  Weight:      Height:        Intake/Output Summary (Last 24 hours) at 08/15/2019 1144 Last data filed at 08/15/2019 0930 Gross per 24 hour  Intake 600 ml  Output 800 ml  Net -200 ml   Filed Weights   08/10/19 1153 08/12/19 0654  Weight: 97.5 kg 97.6 kg    Examination:  General exam: Appears calm and comfortable, not in any distress.  Pleasant.  Confused.  Alert  oriented x 1-2. Respiratory system: Clear to auscultation. Respiratory effort normal. Cardiovascular system: S1 & S2 heard, RRR. No JVD, murmurs, rubs, gallops or clicks. No pedal edema. Gastrointestinal system: Abdomen is nondistended, soft and nontender. No organomegaly or masses felt. Normal bowel sounds heard. Central nervous system: Alert and oriented 1-2. Sleepy now. Pleasantly confused. Speech is clear, slow  Generalized weakness.  No appreciable hemiparesis.    Data Reviewed: I have personally reviewed following labs and imaging studies  CBC: Recent Labs  Lab 08/10/19 0926 08/10/19 0927  WBC 10.9*  --   NEUTROABS 9.2*  --   HGB 14.8 15.3  HCT 45.4 45.0  MCV 91.2  --   PLT 197  --    Basic Metabolic Panel: Recent Labs  Lab 08/10/19 0926 08/10/19 0927  NA 136 137  K 4.3 4.5  CL 105 104  CO2 20*  --   GLUCOSE 139* 133*  BUN 12 15  CREATININE 0.96 0.70  CALCIUM 9.4  --    GFR: Estimated Creatinine Clearance: 90.6 mL/min (by C-G formula based on SCr of 0.7 mg/dL). Liver Function Tests: Recent Labs  Lab 08/10/19 0926  AST 24  ALT 19  ALKPHOS 51  BILITOT 0.8  PROT 7.4  ALBUMIN 4.1   No results for input(s): LIPASE, AMYLASE in the last 168 hours. No results for input(s): AMMONIA in the last 168 hours. Coagulation Profile: Recent Labs  Lab 08/10/19 0926  INR 1.4*   Cardiac Enzymes: No results for input(s): CKTOTAL, CKMB, CKMBINDEX, TROPONINI in the last 168 hours. BNP (last 3 results) No results for input(s): PROBNP in the last 8760 hours. HbA1C: No results for input(s): HGBA1C in the last 72 hours. CBG: Recent Labs  Lab 08/14/19 1147 08/14/19 1629 08/14/19 2108 08/15/19 0613 08/15/19 1138  GLUCAP 130* 154* 156* 121* 120*   Lipid Profile: No results for input(s): CHOL, HDL, LDLCALC, TRIG, CHOLHDL, LDLDIRECT in the last 72 hours. Thyroid Function Tests: No results for input(s): TSH, T4TOTAL, FREET4, T3FREE, THYROIDAB in the last 72 hours.  Anemia Panel: No results for input(s): VITAMINB12, FOLATE, FERRITIN, TIBC, IRON, RETICCTPCT in the last 72 hours. Sepsis Labs: No results for input(s): PROCALCITON, LATICACIDVEN in the last 168 hours.  Recent Results (from the past 240 hour(s))  Respiratory Panel by RT PCR (Flu A&B, Covid) - Nasopharyngeal Swab     Status: None   Collection Time: 08/10/19  9:55 AM   Specimen: Nasopharyngeal Swab  Result Value Ref Range Status   SARS Coronavirus 2 by RT PCR NEGATIVE NEGATIVE Final    Comment: (NOTE) SARS-CoV-2 target nucleic acids are NOT DETECTED. The SARS-CoV-2 RNA is generally detectable in upper respiratoy specimens during the acute phase of infection. The lowest concentration of SARS-CoV-2 viral copies this assay can detect is 131 copies/mL. A negative result does not preclude SARS-Cov-2 infection and should not be  used as the sole basis for treatment or other patient management decisions. A negative result may occur with  improper specimen collection/handling, submission of specimen other than nasopharyngeal swab, presence of viral mutation(s) within the areas targeted by this assay, and inadequate number of viral copies (<131 copies/mL). A negative result must be combined with clinical observations, patient history, and epidemiological information. The expected result is Negative. Fact Sheet for Patients:  https://www.moore.com/ Fact Sheet for Healthcare Providers:  https://www.young.biz/ This test is not yet ap proved or cleared by the [place] FDA and  has been authorized for detection and/or diagnosis of SARS-CoV-2 by FDA under an Emergency Use Authorization (EUA). This EUA will remain  in effect (meaning this test can be used) for the duration of the COVID-19 declaration under Section 564(b)(1) of the Act, 21 U.S.C. section 360bbb-3(b)(1), unless the authorization is terminated or revoked sooner.    Influenza A by PCR NEGATIVE  NEGATIVE Final   Influenza B by PCR NEGATIVE NEGATIVE Final    Comment: (NOTE) The Xpert Xpress SARS-CoV-2/FLU/RSV assay is intended as an aid in  the diagnosis of influenza from Nasopharyngeal swab specimens and  should not be used as a sole basis for treatment. Nasal washings and  aspirates are unacceptable for Xpert Xpress SARS-CoV-2/FLU/RSV  testing. Fact Sheet for Patients: https://www.moore.com/ Fact Sheet for Healthcare Providers: https://www.young.biz/ This test is not yet approved or cleared by the [place] FDA and  has been authorized for detection and/or diagnosis of SARS-CoV-2 by  FDA under an Emergency Use Authorization (EUA). This EUA will remain  in effect (meaning this test can be used) for the duration of the  Covid-19 declaration under Section 564(b)(1) of the Act, 21  U.S.C. section 360bbb-3(b)(1), unless the authorization is  terminated or revoked. Performed at [facility], [address], [place], [place] [address]          Radiology Studies: No results found.      Scheduled Meds: . apixaban  5 mg Oral BID  . atorvastatin  40 mg Oral Daily  . docusate sodium  100 mg Oral BID  . feeding supplement (ENSURE ENLIVE)  237 mL Oral BID BM  . insulin aspart  0-15 Units Subcutaneous TID WC  . insulin aspart  0-5 Units Subcutaneous QHS  . irbesartan  75 mg Oral Daily  . levothyroxine  75 mcg Oral Daily  . melatonin  2 mg Oral QHS  . multivitamin with minerals  1 tablet Oral Daily   Continuous Infusions:    LOS: 5 days    Time spent: 25 minutes    Dorcas Carrow, MD [facility] Pager [phone]

## 2019-08-15 NOTE — Progress Notes (Signed)
Pt able to travel to MRI without RN Per Dr. Jerral Ralph. No need to continue tele at this time per Dr. Jerral Ralph. MD awaiting MRI results.

## 2019-08-15 NOTE — Progress Notes (Signed)
Pt worked with PT this afternoon and this RN was notified by PT pt is unable to hold self up. Pt was able to ambulate on Friday per PT. It was reported to this RN at AM shift change pt was unable to ambulate yesterday but was thought to be due to confusion and lack of sleep. Family allowed to stay overnight and pt slept well last night. Dr. Roda Shutters notified and is going to speak with Dr. Jerral Ralph. Pt alert and oriented to self and place at this time. Pt has been intermittenly confused all day. Will continue to monitor closely.

## 2019-08-15 NOTE — PMR Pre-admission (Signed)
PMR Admission Coordinator Pre-Admission Assessment  Patient: Roger Ramirez is an 74 y.o., male MRN: [id number] DOB: 05-08-1945 Height: 5\' 9"  (175.3 cm) Weight: 97.6 kg              Insurance Information HMO:     PPO: yes     PCP:      IPA:      80/20:      OTHER:  PRIMARY: [employer] Medicare      Policy#: [id number]      Subscriber: pt CM Name: Lesly Rubenstein      Phone#: [phone] option #3     Fax#: [phone] Pre-Cert#: [id number] approved for 7 days      Employer:  Benefits:  Phone #: [phone]     Name: 5/4 Eff. Date: 04/14/2019     Deduct: none      Out of Pocket Max: $3500      Life Max: none  CIR: $295 co pay per day days 1 until 5      SNF: no copay days 1 until 20; $184 per day days 21 until 40; no copay days 41 until 100 Outpatient: $30 per visit     Co-Pay: visits per medical neccesity Home Health: 100%      Co-Pay: visits per medical neccesity DME: 80%     Co-Pay: 20% Providers: in network  SECONDARY: none        The "Data Collection Information Summary" for patients in Inpatient Rehabilitation Facilities with attached "Privacy Act [place] Records" was provided and verbally reviewed with: Family  Emergency Contact Information Contact Information    Name Relation Home Work [place] Daughter [phone]    [place], [place]   [phone]     Current Medical History  Patient Admitting Diagnosis: CVA  History of Present Illness:   74 year old right-handed male with history of saddle pulmonary emboli from presumed antiphospholipid antibody syndrome maintained on Xarelto,  tobacco abuse, hypertension, diabetes mellitus, CVA 2020 with reported cognitive deficits.  Patient lives alone independent prior to admission.  He has a son and daughter in the area who check on him as needed and prepare meals.  1 level home 6 steps to entry.  Presented 08/10/2019 with right lower extremity weakness dysarthria and altered mental status.   Cranial CT scan unremarkable for acute intracranial process.  MRI showed acute infarction affecting the left body of the corpus callosum.  CT angiogram head and neck with atherosclerotic irregularity of the left A3 ACA with high-grade stenosis with suspected subsequent occlusion.  Echocardiogram with ejection fraction of 65%.  Patient did not receive TPA.  Follow-up MRI completed 08/15/2019 showed left anterior cerebral artery territory infarct progressed since the recent MRI of 08/10/2019 no associated hemorrhage.  Neurology consulted with follow-up and currently on  Eliquis for CVA prophylaxis.  Tolerating a regular diet.    Complete NIHSS TOTAL: 3 Glasgow Coma Scale Score: 14  Past Medical History  Past Medical History:  Diagnosis Date  . Antiphospholipid syndrome ([place])   . CVA (cerebral vascular accident) ([place]) 06/07/2019  . Diabetes mellitus without complication ([place])   . Hypertension   . Pulmonary embolism ([facility])     Family History  family history includes Dementia in his maternal grandmother; Hypertension in his father and mother; Stroke in his maternal grandmother.  Prior Rehab/Hospitalizations:  Has the patient had prior rehab or hospitalizations prior to admission? Yes  Has the patient had major surgery during 100 days prior to admission? No  Current Medications   Current Facility-Administered Medications:  .  acetaminophen (TYLENOL) tablet 650 mg, 650 mg, Oral, Q6H PRN **OR** acetaminophen (TYLENOL) suppository 650 mg, 650 mg, Rectal, Q6H PRN, Jonah Blue, MD .  apixaban Everlene Balls) tablet 5 mg, 5 mg, Oral, BID, Dorcas Carrow, MD, 5 mg at 08/16/19 0926 .  atorvastatin (LIPITOR) tablet 40 mg, 40 mg, Oral, Daily, Jonah Blue, MD, 40 mg at 08/16/19 5956 .  bisacodyl (DULCOLAX) EC tablet 5 mg, 5 mg, Oral, Daily PRN, Jonah Blue, MD .  docusate sodium (COLACE) capsule 100 mg, 100 mg, Oral, BID, Jonah Blue, MD, 100 mg at 08/16/19 0926 .  feeding supplement (ENSURE  ENLIVE) (ENSURE ENLIVE) liquid 237 mL, 237 mL, Oral, BID BM, Dorcas Carrow, MD, 237 mL at 08/16/19 0927 .  HYDROcodone-acetaminophen (NORCO/VICODIN) 5-325 MG per tablet 1-2 tablet, 1-2 tablet, Oral, Q4H PRN, Jonah Blue, MD .  insulin aspart (novoLOG) injection 0-15 Units, 0-15 Units, Subcutaneous, TID WC, Jonah Blue, MD, 3 Units at 08/16/19 1206 .  insulin aspart (novoLOG) injection 0-5 Units, 0-5 Units, Subcutaneous, QHS, Jonah Blue, MD .  irbesartan (AVAPRO) tablet 75 mg, 75 mg, Oral, Daily, Dorcas Carrow, MD, 75 mg at 08/16/19 0927 .  levothyroxine (SYNTHROID) tablet 75 mcg, 75 mcg, Oral, Daily, Jonah Blue, MD, 75 mcg at 08/16/19 [phone] .  melatonin tablet 3 mg, 3 mg, Oral, QHS, Ghimire, Kuber, MD, 3 mg at 08/15/19 2134 .  multivitamin with minerals tablet 1 tablet, 1 tablet, Oral, Daily, Dorcas Carrow, MD, 1 tablet at 08/16/19 0926 .  ondansetron (ZOFRAN) tablet 4 mg, 4 mg, Oral, Q6H PRN **OR** ondansetron (ZOFRAN) injection 4 mg, 4 mg, Intravenous, Q6H PRN, Jonah Blue, MD .  polyethylene glycol (MIRALAX / GLYCOLAX) packet 17 g, 17 g, Oral, Daily PRN, Jonah Blue, MD  Patients Current Diet:  Diet Order            Diet heart healthy/carb modified Room service appropriate? Yes; Fluid consistency: Thin  Diet effective ____              Precautions / Restrictions Precautions Precautions: Fall Precaution Comments: heavy posterior lean sitting EOB Restrictions Weight Bearing Restrictions: No   Has the patient had 2 or more falls or a fall with injury in the past year?No  Prior Activity Level Community (5-7x/wk): DId not drive; likes to feed his deer, memory issues at baseline  Prior Functional Level Prior Function Level of Independence: Independent Comments: had recovered gait to walk with no AD outside after first CVA  Self Care: Did the patient need help bathing, dressing, using the toilet or eating?  Independent  Indoor Mobility: Did the patient  need assistance with walking from room to room (with or without device)? Independent  Stairs: Did the patient need assistance with internal or external stairs (with or without device)? Independent  Functional Cognition: Did the patient need help planning regular tasks such as shopping or remembering to take medications? Needed some help  Home Assistive Devices / Equipment    Prior Device Use: Indicate devices/aids used by the patient prior to current illness, exacerbation or injury? None of the above  Current Functional Level Cognition  Arousal/Alertness: Awake/alert Overall Cognitive Status: Impaired/Different from baseline Current Attention Level: Sustained Orientation Level: Disoriented to time, Oriented to person, Oriented to place, Oriented to situation Following Commands: Follows one step commands inconsistently, Follows one step commands with increased time Safety/Judgement: Decreased awareness of safety, Decreased awareness of deficits General Comments: distracted by TV so turned it off  and pt more focused on mobility Attention: Focused Focused Attention: Impaired Focused Attention Impairment: Verbal basic Memory: Impaired Memory Impairment: Decreased recall of new information, Decreased short term memory Decreased Short Term Memory: Verbal basic Awareness: Appears intact Problem Solving: Impaired Problem Solving Impairment: Functional basic, Functional complex, Verbal complex Safety/Judgment: Impaired    Extremity Assessment (includes Sensation/Coordination)  Upper Extremity Assessment: Defer to OT evaluation RUE Deficits / Details: grossly 4/5. decreased proprioception and motor planning. sensation appears intact RUE Sensation: decreased proprioception RUE Coordination: decreased fine motor, decreased gross motor LUE Deficits / Details: strength WFL. decreased propriception and motor planning  Lower Extremity Assessment: Generalized weakness, RLE deficits/detail, LLE  deficits/detail RLE Deficits / Details: R leg 4- strength LLE Deficits / Details: L leg 4+ strength    ADLs  Overall ADL's : Needs assistance/impaired Eating/Feeding: Minimal assistance, Bed level Grooming: Moderate assistance, Bed level Upper Body Bathing: Maximal assistance, Bed level Lower Body Bathing: Maximal assistance, +2 for physical assistance, Bed level Upper Body Dressing : Moderate assistance, Bed level Lower Body Dressing: Maximal assistance, +2 for physical assistance, Bed level Functional mobility during ADLs: Maximal assistance, +2 for physical assistance, Rolling walker General ADL Comments: session focus on functional sit<>stands and sitting balance; dtr reports pt had ambien likely affecting his level of function as pt required MAX A +2 to sit<>stand briefly from EOB. Pt able to sit EOB ~ 2 mins BUE support and MIN A reflective of 5/4   Mobility  Overal bed mobility: Needs Assistance Bed Mobility: Sit to Supine, Rolling, Supine to Sit Rolling: Min assist Supine to sit: Max assist, HOB elevated Sit to supine: +2 for physical assistance, Total assist General bed mobility comments: pt initiated movement to EOB; requires max A and multimodal cues for supine to sit with use of rail; total A +2 to return to bed and granddaughter (works nights on 3W) assisted as pt was sliding off EOB after attempt to stand    Transfers  Overall transfer level: Needs assistance Equipment used: Rolling walker (2 wheeled) Transfers: Sit to/from Stand Sit to Stand: Max assist, From elevated surface General transfer comment: pt unable to stand despite cues and assistance  Reflective of 5/4 treatment.  Patient with a decline in function from 4/30 until 5/3 reflected by not ambulating on 08/15/2019. Repeat MRI on 08/15/2019.   Ambulation / Gait / Stairs / Wheelchair Mobility  Ambulation/Gait Ambulation/Gait assistance: Min assist, Mod assist Gait Distance (Feet): 60 Feet Assistive device:  Rolling walker (2 wheeled) Gait Pattern/deviations: Step-through pattern, Decreased dorsiflexion - right, Decreased stance time - right, Trunk flexed General Gait Details: Pt required cues for R foot height and step length.  LOB with L turn required moderate assistance to correct. Gait velocity: reduced Gait velocity interpretation: <1.31 ft/sec, indicative of household ambulator  Reflective of 4/30 treatment assessment.   Posture / Balance Dynamic Sitting Balance Sitting balance - Comments: mod-max A to maintain sitting balance EOB due to R lateral bias Balance Overall balance assessment: Needs assistance Sitting-balance support: Bilateral upper extremity supported, Feet supported Sitting balance-Leahy Scale: Poor Sitting balance - Comments: mod-max A to maintain sitting balance EOB due to R lateral bias Standing balance support: Bilateral upper extremity supported Standing balance-Leahy Scale: Poor Standing balance comment: reliant on BUE support and external assist    Special needs/care consideration Diabetic management Hgb A1c 6.1 Cognitive deficits at baseline Designated visitors are  Efraim Kaufmann and Kathlene November Decreased safety awareness Granddaughter is RN on [address]. I discussed with daughter that we  have limited visitors of only 2 visitors entire time of admission   Previous Home Environment  Living Arrangements: Alone(girlfriend came on wekeends and family checked in on him dai)  Lives With: Alone Available Help at Discharge: Family, Available 24 hours/day(daughter, son, and granddaughter to asist with 24/7 as neede) Type of Home: House Home Layout: One level Home Access: Stairs to enter Entrance Stairs-Rails: Right, Left Entrance Stairs-Number of Steps: 5 Bathroom Shower/Tub: Engineer, manufacturing systems: Standard Bathroom Accessibility: Yes How Accessible: Accessible via walker Home Care Services: No  Discharge Living Setting Plans for Discharge Living Setting: Patient's  home, Alone Type of Home at Discharge: House Discharge Home Layout: One level Discharge Home Access: Stairs to enter Entrance Stairs-Rails: Right, Left Entrance Stairs-Number of Steps: 5 Discharge Bathroom Shower/Tub: Tub/shower unit Discharge Bathroom Toilet: Standard Discharge Bathroom Accessibility: Yes How Accessible: Accessible via walker Does the patient have any problems obtaining your medications?: No  Social/Family/Support Systems Patient Roles: Partner, Parent Contact Information: daughter, [name] Anticipated Caregiver: daughter, son and grand daughter Anticipated Caregiver's Contact Information: see above Ability/Limitations of Caregiver: daughter can work remotely  Medical laboratory scientific officer: 24/7 Discharge Plan Discussed with Primary Caregiver: Yes Is Caregiver In Agreement with Plan?: Yes Does Caregiver/Family have Issues with Lodging/Transportation while Pt is in Rehab?: No  Goals Patient/Family Goal for Rehab: min A/Mod A PT and OT, supervision SLP Expected length of stay: [facility] 2 to 3 weeks Pt/Family Agrees to Admission and willing to participate: Yes Program Orientation Provided & Reviewed with Pt/Caregiver Including Roles  & Responsibilities: Yes  Decrease burden of Care through IP rehab admission: n/a  Possible need for SNF placement upon discharge:not anticipated  Patient Condition: This patient's medical and functional status has changed since the consult dated 08/11/2019 in which the Rehabilitation Physician determined and documented that the patient was potentially appropriate for intensive rehabilitative care in an inpatient rehabilitation facility. Issues have been addressed and update has been discussed with Dr. Allena Katz and patient now appropriate for inpatient rehabilitation. Will admit to inpatient rehab today.   Preadmission Screen Completed By:  Clois Dupes, RN, 08/16/2019 1:23  PM ______________________________________________________________________   Discussed status with Dr. Allena Katz on 08/16/2019 at  1125 and received approval for admission today.  Admission Coordinator:  Clois Dupes, time 5102 Date 08/16/2019

## 2019-08-15 NOTE — Progress Notes (Addendum)
Physical Therapy Treatment Patient Details Name: Roger Ramirez MRN: [id number] DOB: May 30, 1945 Today's Date: 08/15/2019    History of Present Illness 74 year old gentleman with history of saddle pulmonary embolism on Xarelto, hypertension, diabetes and history of a stroke in 2020 brought to the emergency room as a code stroke. MRI of the brain revealed left ACA territory stroke    PT Comments    Patient presents with further impaired mobility compared to previous PT sessions. Pt awake and alert at time of session and follows single step commands inconsistently and with increased time. Pt with heavy R lateral bias and unable to maintain sitting balance without mod-max A and unable to stand  despite cues and assistance.  RN and Attending notified of change in functional status. Granddaughter present throughout session and assisted in mobility. PT will continue to follow acutely and progress as tolerated.    Follow Up Recommendations  CIR     Equipment Recommendations  None recommended by PT    Recommendations for Other Services       Precautions / Restrictions Precautions Precautions: Fall Restrictions Weight Bearing Restrictions: No    Mobility  Bed Mobility Overal bed mobility: Needs Assistance Bed Mobility: Sit to Supine;Rolling;Supine to Sit Rolling: Min assist   Supine to sit: Max assist;HOB elevated Sit to supine: +2 for physical assistance;Total assist   General bed mobility comments: pt initiated movement to EOB; requires max A and multimodal cues for supine to sit with use of rail; total A +2 to return to bed and granddaughter (works nights on 3W) assisted as pt was sliding off EOB after attempt to stand  Transfers Overall transfer level: Needs assistance Equipment used: Rolling walker (2 wheeled) Transfers: Sit to/from Stand Sit to Stand: Max assist;From elevated surface         General transfer comment: pt unable to stand despite cues and assistance    Ambulation/Gait                 Stairs             Wheelchair Mobility    Modified Rankin (Stroke Patients Only)       Balance Overall balance assessment: Needs assistance Sitting-balance support: Bilateral upper extremity supported;Feet supported Sitting balance-Leahy Scale: Poor Sitting balance - Comments: mod-max A to maintain sitting balance EOB due to R lateral bias                                    Cognition Arousal/Alertness: Awake/alert Behavior During Therapy: Flat affect Overall Cognitive Status: Impaired/Different from baseline Area of Impairment: Problem solving;Awareness;Safety/judgement;Attention;Following commands                   Current Attention Level: Sustained   Following Commands: Follows one step commands inconsistently;Follows one step commands with increased time Safety/Judgement: Decreased awareness of safety;Decreased awareness of deficits Awareness: Intellectual Problem Solving: Requires verbal cues;Requires tactile cues;Difficulty sequencing;Decreased initiation;Slow processing General Comments: distracted by TV so turned it off and pt more focused on mobility      Exercises      General Comments        Pertinent Vitals/Pain Pain Assessment: No/denies pain    Home Living   Living Arrangements: Alone(girlfriend came on wekeends and family checked in on him dai) Available Help at Discharge: Family;Available 24 hours/day(daughter, son, and granddaughter to asist with 24/7 as neede)  Prior Function            PT Goals (current goals can now be found in the care plan section) Acute Rehab PT Goals Patient Stated Goal: family wants CIR  Progress towards PT goals: Not progressing toward goals - comment    Frequency    Min 3X/week      PT Plan Current plan remains appropriate    Co-evaluation              AM-PAC PT "6 Clicks" Mobility   Outcome Measure  Help  needed turning from your back to your side while in a flat bed without using bedrails?: A Little Help needed moving from lying on your back to sitting on the side of a flat bed without using bedrails?: A Lot Help needed moving to and from a bed to a chair (including a wheelchair)?: A Lot Help needed standing up from a chair using your arms (e.g., wheelchair or bedside chair)?: A Lot Help needed to walk in hospital room?: Total Help needed climbing 3-5 steps with a railing? : Total 6 Click Score: 11    End of Session Equipment Utilized During Treatment: Gait belt Activity Tolerance: Patient tolerated treatment well Patient left: with family/visitor present;with nursing/sitter in room;in bed;with call bell/phone within reach Nurse Communication: Mobility status PT Visit Diagnosis: Unsteadiness on feet (R26.81);Muscle weakness (generalized) (M62.81)     Time: 0932-6712 PT Time Calculation (min) (ACUTE ONLY): 40 min  Charges:  $Therapeutic Activity: 23-37 mins                     Earney Navy, PTA Acute Rehabilitation Services Pager: [phone] Office: [phone]     Darliss Cheney 08/15/2019, 4:17 PM

## 2019-08-16 ENCOUNTER — Encounter (HOSPITAL_COMMUNITY): Payer: Self-pay | Admitting: Physical Medicine & Rehabilitation

## 2019-08-16 ENCOUNTER — Other Ambulatory Visit: Payer: Self-pay

## 2019-08-16 ENCOUNTER — Inpatient Hospital Stay (HOSPITAL_COMMUNITY)
Admission: RE | Admit: 2019-08-16 | Discharge: 2019-09-11 | DRG: 056 | Disposition: A | Discharge status: Skilled Nursing Facility | Payer: Medicare Other | Source: Intra-hospital | Attending: Physical Medicine & Rehabilitation | Admitting: Physical Medicine & Rehabilitation

## 2019-08-16 DIAGNOSIS — I219 Acute myocardial infarction, unspecified: Secondary | ICD-10-CM

## 2019-08-16 DIAGNOSIS — Z8673 Personal history of transient ischemic attack (TIA), and cerebral infarction without residual deficits: Secondary | ICD-10-CM | POA: Insufficient documentation

## 2019-08-16 DIAGNOSIS — I63019 Cerebral infarction due to thrombosis of unspecified vertebral artery: Secondary | ICD-10-CM | POA: Diagnosis not present

## 2019-08-16 DIAGNOSIS — Z8249 Family history of ischemic heart disease and other diseases of the circulatory system: Secondary | ICD-10-CM | POA: Diagnosis not present

## 2019-08-16 DIAGNOSIS — I10 Essential (primary) hypertension: Secondary | ICD-10-CM | POA: Diagnosis present

## 2019-08-16 DIAGNOSIS — I352 Nonrheumatic aortic (valve) stenosis with insufficiency: Secondary | ICD-10-CM | POA: Diagnosis present

## 2019-08-16 DIAGNOSIS — I6389 Other cerebral infarction: Secondary | ICD-10-CM | POA: Diagnosis present

## 2019-08-16 DIAGNOSIS — F0151 Vascular dementia with behavioral disturbance: Secondary | ICD-10-CM | POA: Diagnosis present

## 2019-08-16 DIAGNOSIS — Z7984 Long term (current) use of oral hypoglycemic drugs: Secondary | ICD-10-CM

## 2019-08-16 DIAGNOSIS — I69390 Apraxia following cerebral infarction: Secondary | ICD-10-CM

## 2019-08-16 DIAGNOSIS — Z87891 Personal history of nicotine dependence: Secondary | ICD-10-CM | POA: Diagnosis not present

## 2019-08-16 DIAGNOSIS — I2119 ST elevation (STEMI) myocardial infarction involving other coronary artery of inferior wall: Secondary | ICD-10-CM | POA: Diagnosis not present

## 2019-08-16 DIAGNOSIS — G4700 Insomnia, unspecified: Secondary | ICD-10-CM | POA: Diagnosis present

## 2019-08-16 DIAGNOSIS — Z716 Tobacco abuse counseling: Secondary | ICD-10-CM

## 2019-08-16 DIAGNOSIS — I63522 Cerebral infarction due to unspecified occlusion or stenosis of left anterior cerebral artery: Secondary | ICD-10-CM

## 2019-08-16 DIAGNOSIS — E669 Obesity, unspecified: Secondary | ICD-10-CM | POA: Diagnosis present

## 2019-08-16 DIAGNOSIS — I351 Nonrheumatic aortic (valve) insufficiency: Secondary | ICD-10-CM | POA: Diagnosis not present

## 2019-08-16 DIAGNOSIS — I69311 Memory deficit following cerebral infarction: Secondary | ICD-10-CM | POA: Diagnosis not present

## 2019-08-16 DIAGNOSIS — I63322 Cerebral infarction due to thrombosis of left anterior cerebral artery: Secondary | ICD-10-CM | POA: Diagnosis not present

## 2019-08-16 DIAGNOSIS — D6861 Antiphospholipid syndrome: Secondary | ICD-10-CM | POA: Diagnosis present

## 2019-08-16 DIAGNOSIS — I69351 Hemiplegia and hemiparesis following cerebral infarction affecting right dominant side: Principal | ICD-10-CM

## 2019-08-16 DIAGNOSIS — Z823 Family history of stroke: Secondary | ICD-10-CM | POA: Diagnosis not present

## 2019-08-16 DIAGNOSIS — Z20822 Contact with and (suspected) exposure to covid-19: Secondary | ICD-10-CM | POA: Diagnosis present

## 2019-08-16 DIAGNOSIS — I69322 Dysarthria following cerebral infarction: Secondary | ICD-10-CM | POA: Diagnosis not present

## 2019-08-16 DIAGNOSIS — E039 Hypothyroidism, unspecified: Secondary | ICD-10-CM | POA: Diagnosis present

## 2019-08-16 DIAGNOSIS — Z7901 Long term (current) use of anticoagulants: Secondary | ICD-10-CM | POA: Diagnosis not present

## 2019-08-16 DIAGNOSIS — I639 Cerebral infarction, unspecified: Secondary | ICD-10-CM | POA: Diagnosis not present

## 2019-08-16 DIAGNOSIS — I69319 Unspecified symptoms and signs involving cognitive functions following cerebral infarction: Secondary | ICD-10-CM | POA: Diagnosis not present

## 2019-08-16 DIAGNOSIS — Z7982 Long term (current) use of aspirin: Secondary | ICD-10-CM | POA: Diagnosis not present

## 2019-08-16 DIAGNOSIS — Z79890 Hormone replacement therapy (postmenopausal): Secondary | ICD-10-CM | POA: Diagnosis not present

## 2019-08-16 DIAGNOSIS — E785 Hyperlipidemia, unspecified: Secondary | ICD-10-CM | POA: Diagnosis present

## 2019-08-16 DIAGNOSIS — E119 Type 2 diabetes mellitus without complications: Secondary | ICD-10-CM | POA: Diagnosis present

## 2019-08-16 DIAGNOSIS — I213 ST elevation (STEMI) myocardial infarction of unspecified site: Secondary | ICD-10-CM | POA: Diagnosis not present

## 2019-08-16 DIAGNOSIS — I350 Nonrheumatic aortic (valve) stenosis: Secondary | ICD-10-CM | POA: Diagnosis not present

## 2019-08-16 DIAGNOSIS — E7800 Pure hypercholesterolemia, unspecified: Secondary | ICD-10-CM | POA: Diagnosis not present

## 2019-08-16 DIAGNOSIS — Z86711 Personal history of pulmonary embolism: Secondary | ICD-10-CM | POA: Diagnosis not present

## 2019-08-16 DIAGNOSIS — Z79899 Other long term (current) drug therapy: Secondary | ICD-10-CM

## 2019-08-16 DIAGNOSIS — Z6831 Body mass index (BMI) 31.0-31.9, adult: Secondary | ICD-10-CM

## 2019-08-16 DIAGNOSIS — Z66 Do not resuscitate: Secondary | ICD-10-CM | POA: Diagnosis present

## 2019-08-16 DIAGNOSIS — Z86718 Personal history of other venous thrombosis and embolism: Secondary | ICD-10-CM

## 2019-08-16 DIAGNOSIS — I2510 Atherosclerotic heart disease of native coronary artery without angina pectoris: Secondary | ICD-10-CM | POA: Diagnosis present

## 2019-08-16 HISTORY — DX: Type 2 diabetes mellitus without complications: E11.9

## 2019-08-16 HISTORY — DX: Other symptoms and signs involving cognitive functions and awareness: R41.89

## 2019-08-16 HISTORY — DX: Hypothyroidism, unspecified: E03.9

## 2019-08-16 HISTORY — DX: Tobacco use: Z72.0

## 2019-08-16 HISTORY — DX: Nonrheumatic aortic (valve) stenosis: I35.0

## 2019-08-16 LAB — BASIC METABOLIC PANEL
Anion gap: 8 (ref 5–15)
BUN: 18 mg/dL (ref 8–23)
CO2: 22 mmol/L (ref 22–32)
Calcium: 9.1 mg/dL (ref 8.9–10.3)
Chloride: 103 mmol/L (ref 98–111)
Creatinine, Ser: 0.81 mg/dL (ref 0.61–1.24)
GFR calc Af Amer: >60 mL/min (ref >60)
GFR calc non Af Amer: >60 mL/min (ref >60)
Glucose, Bld: 123 mg/dL — ABNORMAL HIGH (ref 70–99)
Potassium: 4.1 mmol/L (ref 3.5–5.1)
Sodium: 133 mmol/L — ABNORMAL LOW (ref 135–145)

## 2019-08-16 LAB — GLUCOSE, CAPILLARY
Glucose-Capillary: 112 mg/dL — ABNORMAL HIGH (ref 70–99)
Glucose-Capillary: 181 mg/dL — ABNORMAL HIGH (ref 70–99)
Glucose-Capillary: 123 mg/dL — ABNORMAL HIGH (ref 70–99)
Glucose-Capillary: 144 mg/dL — ABNORMAL HIGH (ref 70–99)

## 2019-08-16 LAB — CBC
HCT: 43.4 % (ref 39.0–52.0)
Hemoglobin: 14.6 g/dL (ref 13.0–17.0)
MCH: 30.6 pg (ref 26.0–34.0)
MCHC: 33.6 g/dL (ref 30.0–36.0)
MCV: 91 fL (ref 80.0–100.0)
Platelets: 212 10*3/uL (ref 150–400)
RBC: 4.77 MIL/uL (ref 4.22–5.81)
RDW: 13.5 % (ref 11.5–15.5)
WBC: 8.6 10*3/uL (ref 4.0–10.5)
nRBC: 0 % (ref 0.0–0.2)

## 2019-08-16 MED ORDER — HYDROCODONE-ACETAMINOPHEN 5-325 MG PO TABS
1.0000 | ORAL_TABLET | ORAL | Status: DC | PRN
  Administered 2019-08-17: 1 via ORAL
  Filled 2019-08-16: qty 2

## 2019-08-16 MED ORDER — ATORVASTATIN CALCIUM 40 MG PO TABS: 40.0000 mg | ORAL_TABLET | Freq: Every day | ORAL | Status: DC

## 2019-08-16 MED ORDER — DOCUSATE SODIUM 100 MG PO CAPS
100.0000 mg | ORAL_CAPSULE | Freq: Two times a day (BID) | ORAL | Status: DC
  Administered 2019-08-16 – 2019-09-11 (×52): 100 mg via ORAL
  Filled 2019-08-16 (×52): qty 1

## 2019-08-16 MED ORDER — ACETAMINOPHEN 650 MG RE SUPP: 650.0000 mg | Freq: Four times a day (QID) | RECTAL | Status: DC | PRN

## 2019-08-16 MED ORDER — MELATONIN 3 MG PO TABS: 3.0000 mg | ORAL_TABLET | Freq: Every day | ORAL | Status: DC

## 2019-08-16 MED ORDER — ENSURE ENLIVE PO LIQD
237.0000 mL | Freq: Two times a day (BID) | ORAL | Status: DC
  Administered 2019-08-17 – 2019-09-11 (×48): 237 mL via ORAL

## 2019-08-16 MED ORDER — ATORVASTATIN CALCIUM 40 MG PO TABS
40.0000 mg | ORAL_TABLET | Freq: Every day | ORAL | Status: DC
  Administered 2019-08-17 – 2019-09-11 (×26): 40 mg via ORAL
  Filled 2019-08-16 (×26): qty 1

## 2019-08-16 MED ORDER — LEVOTHYROXINE SODIUM 75 MCG PO TABS
75.0000 ug | ORAL_TABLET | Freq: Every day | ORAL | Status: DC
  Administered 2019-08-17 – 2019-09-11 (×25): 75 ug via ORAL
  Filled 2019-08-16 (×25): qty 1

## 2019-08-16 MED ORDER — BISACODYL 5 MG PO TBEC: 5.0000 mg | DELAYED_RELEASE_TABLET | Freq: Every day | ORAL | Status: DC | PRN

## 2019-08-16 MED ORDER — APIXABAN 5 MG PO TABS: 5.0000 mg | ORAL_TABLET | Freq: Two times a day (BID) | ORAL | Status: DC

## 2019-08-16 MED ORDER — ONDANSETRON HCL 4 MG/2ML IJ SOLN: 4.0000 mg | Freq: Four times a day (QID) | INTRAMUSCULAR | Status: DC | PRN

## 2019-08-16 MED ORDER — ADULT MULTIVITAMIN W/MINERALS CH
1.0000 | ORAL_TABLET | Freq: Every day | ORAL | Status: DC
  Administered 2019-08-17 – 2019-09-11 (×26): 1 via ORAL
  Filled 2019-08-16 (×26): qty 1

## 2019-08-16 MED ORDER — MELATONIN 3 MG PO TABS: 3.0000 mg | ORAL_TABLET | Freq: Every evening | ORAL | 0 refills | Status: DC | PRN

## 2019-08-16 MED ORDER — APIXABAN 5 MG PO TABS
5.0000 mg | ORAL_TABLET | Freq: Two times a day (BID) | ORAL | Status: DC
  Administered 2019-08-16 – 2019-09-02 (×34): 5 mg via ORAL
  Filled 2019-08-16 (×34): qty 1

## 2019-08-16 MED ORDER — INSULIN ASPART 100 UNIT/ML ~~LOC~~ SOLN
0.0000 [IU] | Freq: Three times a day (TID) | SUBCUTANEOUS | Status: DC
  Administered 2019-08-16 – 2019-08-18 (×5): 2 [IU] via SUBCUTANEOUS
  Administered 2019-08-18: 3 [IU] via SUBCUTANEOUS
  Administered 2019-08-19 – 2019-08-20 (×4): 2 [IU] via SUBCUTANEOUS
  Administered 2019-08-21: 3 [IU] via SUBCUTANEOUS
  Administered 2019-08-22 – 2019-08-23 (×4): 2 [IU] via SUBCUTANEOUS
  Administered 2019-08-24: 3 [IU] via SUBCUTANEOUS
  Administered 2019-08-24: 2 [IU] via SUBCUTANEOUS
  Administered 2019-08-25: 3 [IU] via SUBCUTANEOUS
  Administered 2019-08-26 – 2019-08-27 (×2): 2 [IU] via SUBCUTANEOUS
  Administered 2019-08-28: 3 [IU] via SUBCUTANEOUS
  Administered 2019-08-29 – 2019-08-30 (×2): 2 [IU] via SUBCUTANEOUS
  Administered 2019-08-30: 1 [IU] via SUBCUTANEOUS
  Administered 2019-08-31: 3 [IU] via SUBCUTANEOUS
  Administered 2019-08-31 – 2019-09-01 (×3): 2 [IU] via SUBCUTANEOUS
  Administered 2019-09-02 – 2019-09-03 (×4): 3 [IU] via SUBCUTANEOUS
  Administered 2019-09-03 – 2019-09-05 (×2): 2 [IU] via SUBCUTANEOUS
  Administered 2019-09-06: 3 [IU] via SUBCUTANEOUS
  Administered 2019-09-07: 2 [IU] via SUBCUTANEOUS
  Administered 2019-09-07: 3 [IU] via SUBCUTANEOUS
  Administered 2019-09-08 (×2): 2 [IU] via SUBCUTANEOUS
  Administered 2019-09-09: 3 [IU] via SUBCUTANEOUS
  Administered 2019-09-09: 2 [IU] via SUBCUTANEOUS
  Administered 2019-09-10: 3 [IU] via SUBCUTANEOUS
  Administered 2019-09-10: 2 [IU] via SUBCUTANEOUS

## 2019-08-16 MED ORDER — MELATONIN 3 MG PO TABS
3.0000 mg | ORAL_TABLET | Freq: Every day | ORAL | Status: DC
  Administered 2019-08-16 – 2019-09-10 (×26): 3 mg via ORAL
  Filled 2019-08-16 (×27): qty 1

## 2019-08-16 MED ORDER — ACETAMINOPHEN 325 MG PO TABS: 650.0000 mg | ORAL_TABLET | Freq: Four times a day (QID) | ORAL | Status: DC | PRN

## 2019-08-16 MED ORDER — SORBITOL 70 % SOLN
30.0000 mL | Freq: Every day | Status: DC | PRN
  Administered 2019-08-28 – 2019-09-02 (×2): 30 mL via ORAL
  Filled 2019-08-16 (×2): qty 30

## 2019-08-16 MED ORDER — POLYETHYLENE GLYCOL 3350 17 G PO PACK
17.0000 g | PACK | Freq: Every day | ORAL | Status: DC | PRN
  Administered 2019-08-27: 17 g via ORAL
  Filled 2019-08-16: qty 1

## 2019-08-16 MED ORDER — ONDANSETRON HCL 4 MG PO TABS: 4.0000 mg | ORAL_TABLET | Freq: Four times a day (QID) | ORAL | Status: DC | PRN

## 2019-08-16 MED ORDER — IRBESARTAN 75 MG PO TABS
75.0000 mg | ORAL_TABLET | Freq: Every day | ORAL | Status: DC
  Administered 2019-08-17 – 2019-09-05 (×20): 75 mg via ORAL
  Filled 2019-08-16 (×21): qty 1

## 2019-08-16 NOTE — Progress Notes (Signed)
Nutrition Follow-up  DOCUMENTATION CODES:   Obesity unspecified  INTERVENTION:  Continue Ensure Enlive po BID, each supplement provides 350 kcal and 20 grams of protein  Continue MVI daily  NUTRITION DIAGNOSIS:   Inadequate oral intake related to lethargy/confusion as evidenced by other (comment)(per RN report).  Progressing, pt with 85% average meal intake.   GOAL:   Patient will meet greater than or equal to 90% of their needs  Progressing.  MONITOR:   PO intake, Supplement acceptance, Labs, Weight trends, I & O's  REASON FOR ASSESSMENT:   Consult Other (Comment)(CVA)  ASSESSMENT:   Pt with a PMH significant for HTN, antiphospholipid syndrome with h/o PE, CVA, and DM was found down and presented as Code Stroke. Pt admitted with CVA.  RN in room at time of visit.  Pt's daughter in room at time of visit. Pt's daughter and granddaughter bring the pt his meals; he eats 3 balanced meals per day with snacks between the meals. Pt's daughter states that they were trying to get him to eat healthier after his last stroke in February and the pt subsequently lost 15-20 lbs; however, the pt has since gained ~ 10 lbs.   PO Intake: 70-100% x last 7 recorded meals (85% average meal intake)  Pt currently receiving Ensure Enlive BID and is consuming them well.   Labs: Na 133 (L), CBGs 112-181 Medications reviewed and include: Colace Novolog, MVI   NUTRITION - FOCUSED PHYSICAL EXAM:    Most Recent Value  Orbital Region  No depletion  Upper Arm Region  Mild depletion  Thoracic and Lumbar Region  No depletion  Buccal Region  No depletion  Temple Region  Mild depletion  Clavicle Bone Region  No depletion  Clavicle and Acromion Bone Region  No depletion  Scapular Bone Region  No depletion  Dorsal Hand  No depletion  Patellar Region  No depletion  Anterior Thigh Region  Mild depletion  Posterior Calf Region  Mild depletion  Edema (RD Assessment)  None  Hair  Reviewed  Eyes   Reviewed  Mouth  Reviewed  Skin  Reviewed  Nails  Reviewed       Diet Order:   Diet Order            Diet heart healthy/carb modified Room service appropriate? Yes; Fluid consistency: Thin  Diet effective ____              EDUCATION NEEDS:   Not appropriate for education at this time  Skin:  Skin Assessment: Skin Integrity Issues: Skin Integrity Issues:: Other (Comment) Other: non-pressure wound to penis  Last BM:  5/4 type 6  Height:   Ht Readings from Last 1 Encounters:  08/10/19 5\' 9"  (1.753 m)    Weight:   Wt Readings from Last 1 Encounters:  08/12/19 97.6 kg    BMI:  Body mass index is 31.77 kg/m.  Estimated Nutritional Needs:   Kcal:  1800-2000  Protein:  90-100 grams  Fluid:  >/= 1.8L/d    10/12/19, MS, RD, LDN RD pager number and weekend/on-call pager number located in Am[facility].

## 2019-08-16 NOTE — Progress Notes (Signed)
Marcello Fennel, MD  Physician  Physical Medicine and Rehabilitation  PMR Pre-admission     Signed  Date of Service:  08/15/2019  4:00 PM      Related encounter: ED to Hosp-Admission (Discharged) from 08/10/2019 in [facility]      Signed        Show:Clear all [x] Manual[x] Template[x] Copied  Added by: [x] Standley Brooking, RN[x] Marcello Fennel, MD  [] Hover for details PMR Admission Coordinator Pre-Admission Assessment   Patient: Roger Ramirez is an 74 y.o., male MRN: [id number] DOB: [birth year] Height: 5\' 9"  (175.3 cm) Weight: 97.6 kg                                                                                                                                                  Insurance Information HMO:     PPO: yes     PCP:      IPA:      80/20:      OTHER:  PRIMARY: [employer] Medicare      Policy#: [id number]      Subscriber: pt CM Name: Luanna Cole      Phone#: [phone] option #3     Fax#: [phone] Pre-Cert#: [id number] approved for 7 days      Employer:  Benefits:  Phone #: [phone]     Name: 5/4 Eff. Date: 04/14/2019     Deduct: none      Out of Pocket Max: $3500      Life Max: none  CIR: $295 co pay per day days 1 until 5      SNF: no copay days 1 until 20; $184 per day days 21 until 40; no copay days 41 until 100 Outpatient: $30 per visit     Co-Pay: visits per medical neccesity Home Health: 100%      Co-Pay: visits per medical neccesity DME: 80%     Co-Pay: 20% Providers: in network  SECONDARY: none         The "Data Collection Information Summary" for patients in Inpatient Rehabilitation Facilities with attached "Privacy Act Statement-Health Care Records" was provided and verbally reviewed with: Family   Emergency Contact Information         Contact Information     Name Relation Home Work Mobile    [place] Daughter [phone]      Emontae, Isabella     [phone]       Current Medical History    Patient Admitting Diagnosis: CVA   History of Present Illness:   74 year old right-handed male with history of saddle pulmonary emboli from presumed antiphospholipid antibody syndrome maintained on Xarelto,  tobacco abuse, hypertension, diabetes mellitus, CVA 2020 with reported cognitive deficits.  Patient lives alone independent prior to admission.  He has a son and daughter in the area who check on him as needed  and prepare meals.  1 level home 6 steps to entry.  Presented 08/10/2019 with right lower extremity weakness dysarthria and altered mental status.  Cranial CT scan unremarkable for acute intracranial process.  MRI showed acute infarction affecting the left body of the corpus callosum.  CT angiogram head and neck with atherosclerotic irregularity of the left A3 ACA with high-grade stenosis with suspected subsequent occlusion.  Echocardiogram with ejection fraction of 65%.  Patient did not receive TPA.  Follow-up MRI completed 08/15/2019 showed left anterior cerebral artery territory infarct progressed since the recent MRI of 08/10/2019 no associated hemorrhage.  Neurology consulted with follow-up and currently on  Eliquis for CVA prophylaxis.  Tolerating a regular diet.    Complete NIHSS TOTAL: 3 Glasgow Coma Scale Score: 14   Past Medical History      Past Medical History:  Diagnosis Date  . Antiphospholipid syndrome ([place])    . CVA (cerebral vascular accident) ([place]) 06/07/2019  . Diabetes mellitus without complication ([place])    . Hypertension    . Pulmonary embolism ([facility])        Family History  family history includes Dementia in his maternal grandmother; Hypertension in his father and mother; Stroke in his maternal grandmother.   Prior Rehab/Hospitalizations:  Has the patient had prior rehab or hospitalizations prior to admission? Yes   Has the patient had major surgery during 100 days prior to admission? No   Current Medications    Current Facility-Administered Medications:  .   acetaminophen (TYLENOL) tablet 650 mg, 650 mg, Oral, Q6H PRN **OR** acetaminophen (TYLENOL) suppository 650 mg, 650 mg, Rectal, Q6H PRN, Karmen Bongo, MD .  apixaban Arne Cleveland) tablet 5 mg, 5 mg, Oral, BID, Barb Merino, MD, 5 mg at 08/16/19 0926 .  atorvastatin (LIPITOR) tablet 40 mg, 40 mg, Oral, Daily, Karmen Bongo, MD, 40 mg at 08/16/19 9326 .  bisacodyl (DULCOLAX) EC tablet 5 mg, 5 mg, Oral, Daily PRN, Karmen Bongo, MD .  docusate sodium (COLACE) capsule 100 mg, 100 mg, Oral, BID, Karmen Bongo, MD, 100 mg at 08/16/19 0926 .  feeding supplement (ENSURE ENLIVE) (ENSURE ENLIVE) liquid 237 mL, 237 mL, Oral, BID BM, Barb Merino, MD, 237 mL at 08/16/19 0927 .  HYDROcodone-acetaminophen (NORCO/VICODIN) 5-325 MG per tablet 1-2 tablet, 1-2 tablet, Oral, Q4H PRN, Karmen Bongo, MD .  insulin aspart (novoLOG) injection 0-15 Units, 0-15 Units, Subcutaneous, TID WC, Karmen Bongo, MD, 3 Units at 08/16/19 1206 .  insulin aspart (novoLOG) injection 0-5 Units, 0-5 Units, Subcutaneous, QHS, Karmen Bongo, MD .  irbesartan (AVAPRO) tablet 75 mg, 75 mg, Oral, Daily, Barb Merino, MD, 75 mg at 08/16/19 0927 .  levothyroxine (SYNTHROID) tablet 75 mcg, 75 mcg, Oral, Daily, Karmen Bongo, MD, 75 mcg at 08/16/19 [phone] .  melatonin tablet 3 mg, 3 mg, Oral, QHS, Ghimire, Kuber, MD, 3 mg at 08/15/19 2134 .  multivitamin with minerals tablet 1 tablet, 1 tablet, Oral, Daily, Barb Merino, MD, 1 tablet at 08/16/19 0926 .  ondansetron (ZOFRAN) tablet 4 mg, 4 mg, Oral, Q6H PRN **OR** ondansetron (ZOFRAN) injection 4 mg, 4 mg, Intravenous, Q6H PRN, Karmen Bongo, MD .  polyethylene glycol (MIRALAX / GLYCOLAX) packet 17 g, 17 g, Oral, Daily PRN, Karmen Bongo, MD   Patients Current Diet:     Diet Order                      Diet heart healthy/carb modified Room service appropriate? Yes; Fluid consistency: Thin  Diet effective ____  Precautions /  Restrictions Precautions Precautions: Fall Precaution Comments: heavy posterior lean sitting EOB Restrictions Weight Bearing Restrictions: No    Has the patient had 2 or more falls or a fall with injury in the past year?No   Prior Activity Level Community (5-7x/wk): DId not drive; likes to feed his deer, memory issues at baseline   Prior Functional Level Prior Function Level of Independence: Independent Comments: had recovered gait to walk with no AD outside after first CVA   Self Care: Did the patient need help bathing, dressing, using the toilet or eating?  Independent   Indoor Mobility: Did the patient need assistance with walking from room to room (with or without device)? Independent   Stairs: Did the patient need assistance with internal or external stairs (with or without device)? Independent   Functional Cognition: Did the patient need help planning regular tasks such as shopping or remembering to take medications? Needed some help   Home Assistive Devices / Equipment   Prior Device Use: Indicate devices/aids used by the patient prior to current illness, exacerbation or injury? None of the above   Current Functional Level Cognition   Arousal/Alertness: Awake/alert Overall Cognitive Status: Impaired/Different from baseline Current Attention Level: Sustained Orientation Level: Disoriented to time, Oriented to person, Oriented to place, Oriented to situation Following Commands: Follows one step commands inconsistently, Follows one step commands with increased time Safety/Judgement: Decreased awareness of safety, Decreased awareness of deficits General Comments: distracted by TV so turned it off and pt more focused on mobility Attention: Focused Focused Attention: Impaired Focused Attention Impairment: Verbal basic Memory: Impaired Memory Impairment: Decreased recall of new information, Decreased short term memory Decreased Short Term Memory: Verbal basic Awareness:  Appears intact Problem Solving: Impaired Problem Solving Impairment: Functional basic, Functional complex, Verbal complex Safety/Judgment: Impaired    Extremity Assessment (includes Sensation/Coordination)   Upper Extremity Assessment: Defer to OT evaluation RUE Deficits / Details: grossly 4/5. decreased proprioception and motor planning. sensation appears intact RUE Sensation: decreased proprioception RUE Coordination: decreased fine motor, decreased gross motor LUE Deficits / Details: strength WFL. decreased propriception and motor planning  Lower Extremity Assessment: Generalized weakness, RLE deficits/detail, LLE deficits/detail RLE Deficits / Details: R leg 4- strength LLE Deficits / Details: L leg 4+ strength     ADLs   Overall ADL's : Needs assistance/impaired Eating/Feeding: Minimal assistance, Bed level Grooming: Moderate assistance, Bed level Upper Body Bathing: Maximal assistance, Bed level Lower Body Bathing: Maximal assistance, +2 for physical assistance, Bed level Upper Body Dressing : Moderate assistance, Bed level Lower Body Dressing: Maximal assistance, +2 for physical assistance, Bed level Functional mobility during ADLs: Maximal assistance, +2 for physical assistance, Rolling walker General ADL Comments: session focus on functional sit<>stands and sitting balance; dtr reports pt had ambien likely affecting his level of function as pt required MAX A +2 to sit<>stand briefly from EOB. Pt able to sit EOB ~ 2 mins BUE support and MIN A reflective of 5/4    Mobility   Overal bed mobility: Needs Assistance Bed Mobility: Sit to Supine, Rolling, Supine to Sit Rolling: Min assist Supine to sit: Max assist, HOB elevated Sit to supine: +2 for physical assistance, Total assist General bed mobility comments: pt initiated movement to EOB; requires max A and multimodal cues for supine to sit with use of rail; total A +2 to return to bed and granddaughter (works nights on 3W)  assisted as pt was sliding off EOB after attempt to stand     Transfers  Overall transfer level: Needs assistance Equipment used: Rolling walker (2 wheeled) Transfers: Sit to/from Stand Sit to Stand: Max assist, From elevated surface General transfer comment: pt unable to stand despite cues and assistance  Reflective of 5/4 treatment.   Patient with a decline in function from 4/30 until 5/3 reflected by not ambulating on 08/15/2019. Repeat MRI on 08/15/2019.    Ambulation / Gait / Stairs / Wheelchair Mobility   Ambulation/Gait Ambulation/Gait assistance: Min assist, Mod assist Gait Distance (Feet): 60 Feet Assistive device: Rolling walker (2 wheeled) Gait Pattern/deviations: Step-through pattern, Decreased dorsiflexion - right, Decreased stance time - right, Trunk flexed General Gait Details: Pt required cues for R foot height and step length.  LOB with L turn required moderate assistance to correct. Gait velocity: reduced Gait velocity interpretation: <1.31 ft/sec, indicative of household ambulator  Reflective of 4/30 treatment assessment.    Posture / Balance Dynamic Sitting Balance Sitting balance - Comments: mod-max A to maintain sitting balance EOB due to R lateral bias Balance Overall balance assessment: Needs assistance Sitting-balance support: Bilateral upper extremity supported, Feet supported Sitting balance-Leahy Scale: Poor Sitting balance - Comments: mod-max A to maintain sitting balance EOB due to R lateral bias Standing balance support: Bilateral upper extremity supported Standing balance-Leahy Scale: Poor Standing balance comment: reliant on BUE support and external assist     Special needs/care consideration Diabetic management Hgb A1c 6.1 Cognitive deficits at baseline Designated visitors are  Efraim Kaufmann and Kathlene November Decreased safety awareness Granddaughter is RN on [address]. I discussed with daughter that we have limited visitors of only 2 visitors entire time of admission     Previous Home Environment  Living Arrangements: Alone(girlfriend came on wekeends and family checked in on him dai)  Lives With: Alone Available Help at Discharge: Family, Available 24 hours/day(daughter, son, and granddaughter to asist with 24/7 as neede) Type of Home: House Home Layout: One level Home Access: Stairs to enter Entrance Stairs-Rails: Right, Left Entrance Stairs-Number of Steps: 5 Bathroom Shower/Tub: Engineer, manufacturing systems: Standard Bathroom Accessibility: Yes How Accessible: Accessible via walker Home Care Services: No   Discharge Living Setting Plans for Discharge Living Setting: Patient's home, Alone Type of Home at Discharge: House Discharge Home Layout: One level Discharge Home Access: Stairs to enter Entrance Stairs-Rails: Right, Left Entrance Stairs-Number of Steps: 5 Discharge Bathroom Shower/Tub: Tub/shower unit Discharge Bathroom Toilet: Standard Discharge Bathroom Accessibility: Yes How Accessible: Accessible via walker Does the patient have any problems obtaining your medications?: No   Social/Family/Support Systems Patient Roles: Partner, Parent Contact Information: daughter, [name] Anticipated Caregiver: daughter, son and grand daughter Anticipated Caregiver's Contact Information: see above Ability/Limitations of Caregiver: daughter can work remotely  Medical laboratory scientific officer: 24/7 Discharge Plan Discussed with Primary Caregiver: Yes Is Caregiver In Agreement with Plan?: Yes Does Caregiver/Family have Issues with Lodging/Transportation while Pt is in Rehab?: No   Goals Patient/Family Goal for Rehab: min A/Mod A PT and OT, supervision SLP Expected length of stay: [facility]OS 2 to 3 weeks Pt/Family Agrees to Admission and willing to participate: Yes Program Orientation Provided & Reviewed with Pt/Caregiver Including Roles  & Responsibilities: Yes   Decrease burden of Care through IP rehab admission: n/a   Possible need for SNF placement  upon discharge:not anticipated   Patient Condition: This patient's medical and functional status has changed since the consult dated 08/11/2019 in which the Rehabilitation Physician determined and documented that the patient was potentially appropriate for intensive rehabilitative care in an inpatient rehabilitation facility. Issues have been  addressed and update has been discussed with Dr. Allena Katz and patient now appropriate for inpatient rehabilitation. Will admit to inpatient rehab today.    Preadmission Screen Completed By:  Clois Dupes, RN, 08/16/2019 1:23 PM ______________________________________________________________________   Discussed status with Dr. Allena Katz on 08/16/2019 at  1125 and received approval for admission today.   Admission Coordinator:  Clois Dupes, time 4268 Date 08/16/2019             Revision History

## 2019-08-16 NOTE — TOC Transition Note (Signed)
Transition of Care [facility]) - CM/SW Discharge Note   Patient Details  Name: Roger Ramirez MRN: [id number] Date of Birth: [birth year]  [facility] CM/SW Contact:  Kermit Balo, RN Phone Number: 08/16/2019, 10:47 AM   Clinical Narrative:    Pt is discharging to CIR today. CM signing off.    Final next level of care: IP Rehab Facility Barriers to Discharge: No Barriers Identified   Patient Goals and CMS Choice        Discharge Placement                       Discharge Plan and Services                                     Social Determinants of Health (SDOH) Interventions     Readmission Risk Interventions No flowsheet data found.

## 2019-08-16 NOTE — Progress Notes (Signed)
Roger Arn, MD  Physician  Physical Medicine and Rehabilitation  Consult Note     Signed  Date of Service:  08/11/2019  2:28 PM      Related encounter: ED to Hosp-Admission (Discharged) from 08/10/2019 in [facility]      Signed      Expand AllCollapse All   Show:Clear all [x]Manual[x]Template[]Copied  Added by: [x]Angiulli, Lavon Paganini, PA-C[x]Patel, Domenick Bookbinder, MD  []Hover for details          Physical Medicine and Rehabilitation Consult Reason for Consult: Right leg weakness and slurred speech Referring Physician: [facility]   HPI: Roger Ramirez is a 74 y.o. 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  Dementia, CVA 2020.  Patient lives alone independent prior to admission.  He has a son and daughter in the area with questionable ability to provide 24/7 care discharge.  1 level home 6 steps to entry.  He presented on 08/10/2019 with right lower extremity weakness, dysarthria, and increased AMS.  History taken from chart review and daughter due to cognition.  Cranial CT unremarkable for acute intracranial process.  CT angiogram head and neck with atherosclerotic irregularity of the left A3 ACA with high-grade stenosis and suspected subsequent occlusion.  Echocardiogram with ejection fraction of 65%.  Patient did not receive TPA.  Neurology consulted patient currently on aspirin as well as Xarelto.  Tolerating a regular diet.  Therapy evaluations completed with recommendations of physical medicine rehab consult.  Daughter states patient with cognitive deficits at baseline, requiring someone to check on him daily as well as prepare meals.  She notes significant improvement in cognitive processing and weakness since admission.   Review of Systems  Unable to perform ROS: Mental acuity        Past Medical History:  Diagnosis Date  . Antiphospholipid syndrome ([place])    . CVA (cerebral vascular  accident) ([place]) 06/07/2019  . Diabetes mellitus without complication ([place])    . Hypertension    . Pulmonary embolism ([place])      History reviewed. No pertinent surgical history.      Family History  Problem Relation Age of Onset  . Hypertension Mother    . Hypertension Father    . Dementia Maternal Grandmother    . Stroke Maternal Grandmother      Social History:  reports that he has quit smoking. His smoking use included cigarettes. He has a 20.00 pack-year smoking history. He has never used smokeless tobacco. He reports previous alcohol use. He reports that he does not use drugs. Allergies: No Known Allergies Medications Prior to Admission  Medication Sig Dispense Refill  . ACCU-CHEK AVIVA PLUS test strip 1 each by Other route daily.       . Accu-Chek FastClix Lancets MISC 1 each by Other route in the morning, at noon, in the evening, and at bedtime.       Marland Kitchen aspirin EC 81 MG tablet Take 81 mg by mouth daily.      Marland Kitchen atorvastatin (LIPITOR) 10 MG tablet Take 10 mg by mouth daily.      . blood glucose meter kit and supplies KIT Dispense based on patient and insurance preference. Use up to four times daily as directed. (FOR ICD-9 250.00, 250.01). (Patient taking differently: 1 each by Other route See admin instructions. Dispense based on patient and insurance preference. Use up to four times daily as directed. (FOR ICD-9 250.00, 250.01).) 1 each  0  . irbesartan (AVAPRO) 150 MG tablet Take 75 mg by mouth daily.      Marland Kitchen levothyroxine (SYNTHROID) 75 MCG tablet Take 75 mcg by mouth daily.      . metFORMIN (GLUCOPHAGE) 1000 MG tablet Take 1,000 mg by mouth 2 (two) times daily.      . Omega-3 Fatty Acids (FISH OIL) 1200 MG CAPS Take 1,200 mg by mouth daily.      . rivaroxaban (XARELTO) 20 MG TABS tablet Take 1 tablet (20 mg total) by mouth daily. 90 tablet 3  . vitamin B-12 1000 MCG tablet Take 1 tablet (1,000 mcg total) by mouth daily. 30 tablet 0      Home: Home Living Family/patient expects  to be discharged to:: Private residence Living Arrangements: Alone Available Help at Discharge: Family Type of Home: House Home Access: Stairs to enter Technical brewer of Steps: 6 Entrance Stairs-Rails: Right, Left Home Layout: One level Bathroom Shower/Tub: Chiropodist: Standard  Functional History: Prior Function Level of Independence: Independent Functional Status:  Mobility: Bed Mobility Overal bed mobility: Needs Assistance Bed Mobility: Sit to Supine, Supine to Sit Supine to sit: Max assist, HOB elevated Sit to supine: Max assist, +2 for physical assistance General bed mobility comments: Max A to powerup trunk and pivot hips to EOB position. Max +2 A to return to supine with pt fatigued after sitting EOB about a minute Transfers General transfer comment: unable to attempt this session   ADL: ADL Overall ADL's : Needs assistance/impaired Eating/Feeding: Minimal assistance, Bed level Grooming: Moderate assistance, Bed level Upper Body Bathing: Maximal assistance, Bed level Lower Body Bathing: Maximal assistance, +2 for physical assistance, Bed level Upper Body Dressing : Moderate assistance, Bed level Lower Body Dressing: Maximal assistance, +2 for physical assistance, Bed level General ADL Comments: Pt completed bed mobility and sat EOB about 1 minute. Fatigued quickly initiating return to supine. Heavy posterior lean sitting EOB needing mod to max A.    Cognition: Cognition Overall Cognitive Status: Impaired/Different from baseline Orientation Level: Oriented to person, Oriented to place, Disoriented to time, Disoriented to situation Cognition Arousal/Alertness: Awake/alert Behavior During Therapy: Flat affect Overall Cognitive Status: Impaired/Different from baseline Area of Impairment: Problem solving, Awareness, Safety/judgement, Following commands, Memory, Attention Current Attention Level: Focused Memory: Decreased recall of  precautions, Decreased short-term memory Following Commands: Follows one step commands with increased time Safety/Judgement: Decreased awareness of deficits Awareness: Intellectual Problem Solving: Slow processing, Decreased initiation, Difficulty sequencing, Requires verbal cues General Comments: A&O x 4. Cognition deficits impacting ADLs and transfers.   Blood pressure (!) 184/94, pulse 76, temperature 98.3 F (36.8 C), temperature source Oral, resp. rate 16, height 5' 9" (1.753 m), weight 97.5 kg, SpO2 98 %. Physical Exam  Vitals reviewed. Constitutional: He appears well-developed and well-nourished.  Bilateral upper extremity mittens  HENT:  Head: Normocephalic and atraumatic.  Eyes: EOM are normal. Right eye exhibits no discharge. Left eye exhibits no discharge.  Neck: No tracheal deviation present. No thyromegaly present.  Respiratory: Effort normal. No stridor. No respiratory distress.  GI: Soft. He exhibits no distension.  Musculoskeletal:     Comments: No edema or tenderness in extremities  Neurological: He is alert.  Alert and oriented, except for president with increased time and minimal cueing Mild dysarthria Limited awareness of deficits.   Follows one-step commands. Motor: Bilateral upper extremities: 5/5 proximal distal Left lower extremity: 4-4+/5 proximal distal Right lower extremity: Hip flexion, knee extension 3+/5, ankle dorsiflexion 3+/5 Sensation appears intact  light touch  Skin: Skin is warm and dry.  Psychiatric: His speech is tangential. Cognition and memory are impaired. He expresses impulsivity.      Lab Results Last 24 Hours       Results for orders placed or performed during the hospital encounter of 08/10/19 (from the past 24 hour(s))  Hemoglobin A1c     Status: Abnormal    Collection Time: 08/10/19  3:26 PM  Result Value Ref Range    Hgb A1c MFr Bld 6.1 (H) 4.8 - 5.6 %    Mean Plasma Glucose 128.37 mg/dL  TSH     Status: None    Collection  Time: 08/10/19  3:26 PM  Result Value Ref Range    TSH 1.117 0.350 - 4.500 uIU/mL  Glucose, capillary     Status: Abnormal    Collection Time: 08/10/19  5:48 PM  Result Value Ref Range    Glucose-Capillary 107 (H) 70 - 99 mg/dL  Glucose, capillary     Status: Abnormal    Collection Time: 08/10/19  9:47 PM  Result Value Ref Range    Glucose-Capillary 113 (H) 70 - 99 mg/dL    Comment 1 Notify RN      Comment 2 Document in Chart    Lipid panel     Status: Abnormal    Collection Time: 08/11/19  4:04 AM  Result Value Ref Range    Cholesterol 120 0 - 200 mg/dL    Triglycerides 139 <150 mg/dL    HDL 35 (L) >40 mg/dL    Total CHOL/HDL Ratio 3.4 RATIO    VLDL 28 0 - 40 mg/dL    LDL Cholesterol 57 0 - 99 mg/dL  Glucose, capillary     Status: Abnormal    Collection Time: 08/11/19  6:07 AM  Result Value Ref Range    Glucose-Capillary 121 (H) 70 - 99 mg/dL    Comment 1 Notify RN      Comment 2 Document in Chart    Glucose, capillary     Status: Abnormal    Collection Time: 08/11/19 12:52 PM  Result Value Ref Range    Glucose-Capillary 110 (H) 70 - 99 mg/dL       Imaging Results (Last 48 hours)  CT ANGIO HEAD W OR WO CONTRAST   Result Date: 08/10/2019 CLINICAL DATA:  Code stroke follow-up, right facial droop and leg weakness EXAM: CT ANGIOGRAPHY HEAD AND NECK TECHNIQUE: Multidetector CT imaging of the head and neck was performed using the standard protocol during bolus administration of intravenous contrast. Multiplanar CT image reconstructions and MIPs were obtained to evaluate the vascular anatomy. Carotid stenosis measurements (when applicable) are obtained utilizing NASCET criteria, using the distal internal carotid diameter as the denominator. CONTRAST:  39m OMNIPAQUE IOHEXOL 350 MG/ML SOLN COMPARISON:  None. FINDINGS: CTA NECK Aortic arch: Calcified plaque along the arch and patent great vessel origins. Common origin of the innominate and left common carotid arteries. There is  irregular noncalcified plaque along the left subclavian origin with mild stenosis. Right carotid system: Patent. Primarily calcified plaque along the proximal ICA causing less than 50% stenosis. Left carotid system: Patent. Atherosclerotic wall thickening along the common carotid. Primarily calcified plaque along the proximal ICA causing less than 50% stenosis. Vertebral arteries: Patent. Skeleton: Degenerative changes of the cervical spine. There is partial fusion of the C4 and C5 vertebral bodies. Other neck: No mass or adenopathy. Upper chest: No apical lung mass. Review of the MIP images confirms the above  findings CTA HEAD Anterior circulation: Intracranial internal carotid arteries are patent with calcified plaque causing mild to moderate stenosis. Middle cerebral arteries are patent with mild atherosclerotic irregularity more distally. Right anterior cerebral artery is patent. The proximal left anterior cerebral artery is patent. There is atherosclerotic irregularity of the left A3 ACA with high-grade stenosis and probable subsequent occlusion. Posterior circulation: Intracranial vertebral arteries, basilar artery, and posterior cerebral arteries are patent. Small posterior communicating arteries are present. There is short segment high-grade stenosis of the proximal left P2 PCA. Venous sinuses: As permitted by contrast timing, patent. Review of the MIP images confirms the above findings IMPRESSION: Atherosclerotic irregularity of left A3 ACA with high-grade stenosis and suspected subsequent occlusion. Short segment high-grade stenosis proximal left P2 PCA. Calcified plaque at the ICA origins causing less than 50% stenosis. Initial is results were discussed by telephone at the time of interpretation on 08/10/2019 at 9:54 am to provider Dr. Lorraine Lax, who verbally acknowledged these results. Electronically Signed   By: Macy Mis M.D.   On: 08/10/2019 10:07    CT ANGIO NECK W OR WO CONTRAST   Result Date:  08/10/2019 CLINICAL DATA:  Code stroke follow-up, right facial droop and leg weakness EXAM: CT ANGIOGRAPHY HEAD AND NECK TECHNIQUE: Multidetector CT imaging of the head and neck was performed using the standard protocol during bolus administration of intravenous contrast. Multiplanar CT image reconstructions and MIPs were obtained to evaluate the vascular anatomy. Carotid stenosis measurements (when applicable) are obtained utilizing NASCET criteria, using the distal internal carotid diameter as the denominator. CONTRAST:  52m OMNIPAQUE IOHEXOL 350 MG/ML SOLN COMPARISON:  None. FINDINGS: CTA NECK Aortic arch: Calcified plaque along the arch and patent great vessel origins. Common origin of the innominate and left common carotid arteries. There is irregular noncalcified plaque along the left subclavian origin with mild stenosis. Right carotid system: Patent. Primarily calcified plaque along the proximal ICA causing less than 50% stenosis. Left carotid system: Patent. Atherosclerotic wall thickening along the common carotid. Primarily calcified plaque along the proximal ICA causing less than 50% stenosis. Vertebral arteries: Patent. Skeleton: Degenerative changes of the cervical spine. There is partial fusion of the C4 and C5 vertebral bodies. Other neck: No mass or adenopathy. Upper chest: No apical lung mass. Review of the MIP images confirms the above findings CTA HEAD Anterior circulation: Intracranial internal carotid arteries are patent with calcified plaque causing mild to moderate stenosis. Middle cerebral arteries are patent with mild atherosclerotic irregularity more distally. Right anterior cerebral artery is patent. The proximal left anterior cerebral artery is patent. There is atherosclerotic irregularity of the left A3 ACA with high-grade stenosis and probable subsequent occlusion. Posterior circulation: Intracranial vertebral arteries, basilar artery, and posterior cerebral arteries are patent. Small  posterior communicating arteries are present. There is short segment high-grade stenosis of the proximal left P2 PCA. Venous sinuses: As permitted by contrast timing, patent. Review of the MIP images confirms the above findings IMPRESSION: Atherosclerotic irregularity of left A3 ACA with high-grade stenosis and suspected subsequent occlusion. Short segment high-grade stenosis proximal left P2 PCA. Calcified plaque at the ICA origins causing less than 50% stenosis. Initial is results were discussed by telephone at the time of interpretation on 08/10/2019 at 9:54 am to provider Dr. ALorraine Lax who verbally acknowledged these results. Electronically Signed   By: PMacy MisM.D.   On: 08/10/2019 10:07    MR BRAIN WO CONTRAST   Result Date: 08/10/2019 CLINICAL DATA:  Right facial droop, slurred speech and  confusion today. Negative acute CT evaluation. EXAM: MRI HEAD WITHOUT CONTRAST TECHNIQUE: Multiplanar, multiecho pulse sequences of the brain and surrounding structures were obtained without intravenous contrast. COMPARISON:  CT studies earlier same day.  MRI 06/06/2018. FINDINGS: Brain: Brain atrophy with some temporal lobe predominance. Diffusion imaging shows acute infarction in the left body of the corpus callosum. No other acute infarction. Elsewhere, there are moderate chronic small-vessel ischemic changes of the hemispheric white matter. No mass lesion, hemorrhage, hydrocephalus or extra-axial collection. Vascular: Major vessels at the base of the brain show flow. Skull and upper cervical spine: Negative Sinuses/Orbits: Clear/normal Other: None IMPRESSION: Acute infarction affecting the left body of the corpus callosum. Brain atrophy with temporal lobe predominance. Chronic small-vessel ischemic changes affecting the cerebral hemispheric white matter. Electronically Signed   By: Nelson Chimes M.D.   On: 08/10/2019 10:57    ECHOCARDIOGRAM COMPLETE   Result Date: 08/10/2019    ECHOCARDIOGRAM REPORT   Patient  Name:   Roger Ramirez Date of Exam: 08/10/2019 Medical Rec #:  [id number]     Height:       69.0 in Accession #:    [id number]    Weight:       214.9 lb Date of Birth:  13-Feb-1946     BSA:          2.130 m Patient Age:    12 years      BP:           189/93 mmHg Patient Gender: M             HR:           66 bpm. Exam Location:  Inpatient Procedure: 2D Echo, Cardiac Doppler and Color Doppler Indications:    Stroke 434.91 / I163.9  History:        Patient has prior history of Echocardiogram examinations, most                 recent 06/06/2018. Risk Factors:Hypertension and Diabetes.                 Pulmonary Embolism.  Sonographer:    Jonelle Sidle Dance Referring Phys: [place]  1. Left ventricular ejection fraction, by estimation, is 60 to 65%. The left ventricle has normal function. The left ventricle has no regional wall motion abnormalities. Left ventricular diastolic parameters are consistent with Grade I diastolic dysfunction (impaired relaxation). Elevated left ventricular end-diastolic pressure.  2. Right ventricular systolic function is normal. The right ventricular size is normal.  3. The mitral valve is normal in structure. Trivial mitral valve regurgitation. No evidence of mitral stenosis.  4. The aortic valve was not well visualized. Aortic valve regurgitation is not visualized. Moderate aortic valve stenosis. Aortic valve mean gradient measures 23.7 mmHg. Aortic valve Vmax measures 3.19 m/s.  5. The inferior vena cava is normal in size with greater than 50% respiratory variability, suggesting right atrial pressure of 3 mmHg. FINDINGS  Left Ventricle: Left ventricular ejection fraction, by estimation, is 60 to 65%. The left ventricle has normal function. The left ventricle has no regional wall motion abnormalities. The left ventricular internal cavity size was normal in size. There is  no left ventricular hypertrophy. Left ventricular diastolic parameters are consistent with Grade I  diastolic dysfunction (impaired relaxation). Elevated left ventricular end-diastolic pressure. Right Ventricle: The right ventricular size is normal. No increase in right ventricular wall thickness. Right ventricular systolic function is normal. Left Atrium: Left atrial size was normal in  size. Right Atrium: Right atrial size was normal in size. Pericardium: There is no evidence of pericardial effusion. Mitral Valve: The mitral valve is normal in structure. Normal mobility of the mitral valve leaflets. Trivial mitral valve regurgitation. No evidence of mitral valve stenosis. Tricuspid Valve: The tricuspid valve is normal in structure. Tricuspid valve regurgitation is not demonstrated. No evidence of tricuspid stenosis. Aortic Valve: The aortic valve was not well visualized. . There is moderate thickening and moderate calcification of the aortic valve. Aortic valve regurgitation is not visualized. Moderate aortic stenosis is present. There is moderate thickening of the aortic valve. There is moderate calcification of the aortic valve. Aortic valve mean gradient measures 23.7 mmHg. Aortic valve peak gradient measures 40.6 mmHg. Aortic valve area, by VTI measures 0.50 cm. Pulmonic Valve: The pulmonic valve was normal in structure. Pulmonic valve regurgitation is not visualized. No evidence of pulmonic stenosis. Aorta: The aortic root is normal in size and structure. Venous: The inferior vena cava is normal in size with greater than 50% respiratory variability, suggesting right atrial pressure of 3 mmHg. IAS/Shunts: No atrial level shunt detected by color flow Doppler.  LEFT VENTRICLE PLAX 2D LVIDd:         4.98 cm  Diastology LVIDs:         3.81 cm  LV e' lateral:   10.80 cm/s LV PW:         1.08 cm  LV E/e' lateral: 5.2 LV IVS:        0.94 cm  LV e' medial:    5.44 cm/s LVOT diam:     1.50 cm  LV E/e' medial:  10.3 LV SV:         37 LV SV Index:   17 LVOT Area:     1.77 cm  RIGHT VENTRICLE            IVC RV Basal  diam:  2.13 cm    IVC diam: 1.76 cm RV S prime:     9.25 cm/s TAPSE (M-mode): 1.7 cm LEFT ATRIUM             Index       RIGHT ATRIUM           Index LA diam:        3.10 cm 1.46 cm/m  RA Area:     10.10 cm LA Vol (A2C):   43.7 ml 20.51 ml/m RA Volume:   16.60 ml  7.79 ml/m LA Vol (A4C):   25.4 ml 11.92 ml/m LA Biplane Vol: 34.5 ml 16.20 ml/m  AORTIC VALVE AV Area (Vmax):    0.49 cm AV Area (Vmean):   0.42 cm AV Area (VTI):     0.50 cm AV Vmax:           318.67 cm/s AV Vmean:          227.667 cm/s AV VTI:            0.736 m AV Peak Grad:      40.6 mmHg AV Mean Grad:      23.7 mmHg LVOT Vmax:         88.70 cm/s LVOT Vmean:        54.700 cm/s LVOT VTI:          0.207 m LVOT/AV VTI ratio: 0.28  AORTA Ao Root diam: 3.40 cm MITRAL VALVE MV Area (PHT): 3.12 cm     SHUNTS MV Decel Time: 243 msec     Systemic VTI:  0.21 m MV E velocity: 55.80 cm/s   Systemic Diam: 1.50 cm MV A velocity: 112.00 cm/s MV E/A ratio:  0.50 Skeet Latch MD Electronically signed by Skeet Latch MD Signature Date/Time: 08/10/2019/6:25:01 PM    Final     CT HEAD CODE STROKE WO CONTRAST   Result Date: 08/10/2019 CLINICAL DATA:  Code stroke. Right-sided facial droop and right leg weakness EXAM: CT HEAD WITHOUT CONTRAST TECHNIQUE: Contiguous axial images were obtained from the base of the skull through the vertex without intravenous contrast. COMPARISON:  06/06/2018 brain MRI FINDINGS: Brain: No evidence of acute infarction, hemorrhage, hydrocephalus, extra-axial collection or mass lesion/mass effect. Atrophy that is severe in the temporal lobes. Chronic small vessel ischemia with confluent low-density in the deep cerebral white matter and remote lacunar infarct at the right caudate head. Vascular: Atherosclerotic calcification.  No hyperdense vessel Skull: Normal. Negative for fracture or focal lesion. Sinuses/Orbits: Negative Other: These results were communicated to Dr. Lorraine Lax at 9:34 amon 4/29/2021by text page via the [facility]  messaging system. ASPECTS [facility] Stroke Program Early CT Score) - Ganglionic level infarction (caudate, lentiform nuclei, internal capsule, insula, M1-M3 cortex): 7 - Supraganglionic infarction (M4-M6 cortex): 3 Total score (0-10 with 10 being normal): 10 IMPRESSION: 1. No acute finding. 2. Severe temporal lobe atrophy. 3. Chronic small vessel ischemia. Electronically Signed   By: Monte Fantasia M.D.   On: 08/10/2019 09:35       Assessment/Plan: Diagnosis: Left ACA infarct Stroke: Continue secondary stroke prophylaxis and Risk Factor Modification listed below:   Antiplatelet therapy:   Blood Pressure Management:  Continue current medication with prn's with permisive HTN per primary team Statin Agent:   Diabetes management:   Tobacco abuse:   Labs independently reviewed.  Records reviewed and summated above.   1. Does the need for close, 24 hr/day medical supervision in concert with the patient's rehab needs make it unreasonable for this patient to be served in a less intensive setting? Potentially 2. Co-Morbidities requiring supervision/potential complications: saddle pulmonary embolism (continue Xarelto), tobacco use (counsel), HTN (monitor and provide prns in accordance with increased physical exertion and pain), DM (Monitor in accordance with exercise and adjust meds as necessary),?  Dementia, history of CVA  3. Due to safety, disease management and patient education, does the patient require 24 hr/day rehab nursing? Potentially 4. Does the patient require coordinated care of a physician, rehab nurse, therapy disciplines of PT/OT/SLP to address physical and functional deficits in the context of the above medical diagnosis(es)? Potentially Addressing deficits in the following areas: balance, endurance, locomotion, strength, transferring, bathing, dressing, toileting, cognition and psychosocial support 5. Can the patient actively participate in an intensive therapy program of at least 3 hrs of  therapy per day at least 5 days per week? Yes 6. The potential for patient to make measurable gains while on inpatient rehab is excellent 7. Anticipated functional outcomes upon discharge from inpatient rehab are supervision  with PT, supervision with OT, supervision and min assist with SLP. 8. Estimated rehab length of stay to reach the above functional goals is: 4-6 days. 9. Anticipated discharge destination: Home 10. Overall Rehab/Functional Prognosis: good   RECOMMENDATIONS: This patient's condition is appropriate for continued rehabilitative care in the following setting: Daughter notes patient with significant functional as well as cognitive improvement since admission.  Recommend CIR if patient does not progress to supervision level of functioning and caregiver support is available upon discharge. Patient has agreed to participate in recommended program. Potentially Note that insurance  prior authorization may be required for reimbursement for recommended care.   Comment: Rehab Admissions Coordinator to follow up.   I have personally performed a face to face diagnostic evaluation, including, but not limited to relevant history and physical exam findings, of this patient and developed relevant assessment and plan.  Additionally, I have reviewed and concur with the physician assistant's documentation above.    Delice Lesch, MD, ABPMR Lavon Paganini Angiulli, PA-C 08/11/2019        Revision History                     Routing History

## 2019-08-16 NOTE — Progress Notes (Signed)
  Speech Language Pathology Treatment: Cognitive-Linquistic  Patient Details Name: Va Broadwell MRN: [id number] DOB: August 07, 1945 Today's Date: 08/16/2019 Time: 6222-9798 SLP Time Calculation (min) (ACUTE ONLY): 28 min  Assessment / Plan / Recommendation Clinical Impression  Mr. Bogden was seen for skilled cognitive/speech/language therapy. Family (daughter and granddaughter) were present to aid as historians. Pt denies cognitive or speech deficits (poor insight), but family reports their biggest goal for him is to increase his communication effectiveness, as right now he is providing very brief and generalized answers. For example, when questioned what he was eating he said, "food." They report he has been doing this since this event and is unable to provide any details. Pt required max cues for naming each type of fruit in his fruit salad. During confrontation naming, pt named 5/7 trialed objects. The two errors were by omission, despite cueing. In divergent naming, pt was only able to i'ly provide one object out of 8 trials on his own, increased to 5/8 given max semantic cues. During conversation, pt frequently stated, "I can't remember," which was difficult to discern if it was from memory deficits or word finding deficits. He is now unable to use his simple cell phone, which he could do for safety prior. Reading comprehension is good at simple sentence level. Pt/family educated on providing environmental and visual cues for sequencing tasks, like using the phone. Pt is able to read "open the phone" and do it. They demonstrated understanding of this strategy. He also keeps a calendar at home that he checks off when he takes his medicine daily (organized by family). Pt very open to further treatment and environmental/external aids for memory and word finding.    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 MRI of the brain revealed left ACA territory stroke. Pt has baseline cognitive issues and was last seen by ST 06/07/2018. At that time, pt demonstrated cognitive challenges with basic tasks such as telling time, naming common objects, simple divergent naming, and problem solving. Now with increased cognitive decline.      SLP Plan   Continue cognitive treatment on acute level and at next level (CIR)       Recommendations   Continue full assist for any IADL or complex cognition tasks. Further SLP for language deficits and cognitive compensation. CIR.               Alaila Pillard P. Victorious Cosio, M.S., CCC-SLP Speech-Language Pathologist Acute Rehabilitation Services Pager: [phone]       Susanne Borders Aairah Negrette 08/16/2019, 2:47 PM

## 2019-08-16 NOTE — Progress Notes (Signed)
Pt arrived to unit via recliner, family present with belongings. Transferred to bed via stedy x 2 max assist. Denies pain. Oriented to unit and safety precautions. Call bell placed in reach and bed alarm on with bed in lowest position. Will cont to monitor.   Ross Ludwig, RN

## 2019-08-16 NOTE — Progress Notes (Signed)
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 MRI of the brain revealed left ACA territory stroke    PT Comments    Pt worked hard in therapy today.  Emphasis on transitions, work on sitting balance with upright posture and forward pelvic tilt.  Sit to stand with upright stance and blocking support at R knee and ending up in the chair with significant transfer assist.   Follow Up Recommendations  CIR     Equipment Recommendations  None recommended by PT    Recommendations for Other Services       Precautions / Restrictions Precautions Precautions: Fall Precaution Comments: heavy posterior lean sitting EOB Required Braces or Orthoses: (hand mitts) Restrictions Weight Bearing Restrictions: No    Mobility  Bed Mobility Overal bed mobility: Needs Assistance Bed Mobility: Sit to Supine       Sit to supine: Max assist;+2 for physical assistance;+2 for safety/equipment   General bed mobility comments: pt following commands to progress BLE toward EOB, required maxA to progress trunk to upright posture and maintain posture  Transfers Overall transfer level: Needs assistance Equipment used: 2 person hand held assist Transfers: Sit to/from [employer] Sit to Stand: Max assist;+2 physical assistance;+2 safety/equipment Stand pivot transfers: Max assist;+2 physical assistance;+2 safety/equipment       General transfer comment: maxA+2 with pt pulling up on recliner, maxA for stability in standing and assist to block R knee;while in standing maxA+2 to shift weight R<>L;sit<>stand x3;pt required maxA for stand pivot towards left to recliner  Ambulation/Gait             General Gait  Details: NT   Stairs             Wheelchair Mobility    Modified Rankin (Stroke Patients Only) Modified Rankin (Stroke Patients Only) Pre-Morbid Rankin Score: No symptoms Modified Rankin: Severe disability     Balance Overall balance assessment: Needs assistance Sitting-balance support: Bilateral upper extremity supported;Feet supported Sitting balance-Leahy Scale: Poor Sitting balance - Comments: mod-max A to maintain sitting balance EOB due to R lateral bias Postural control: Posterior lean;Right lateral lean Standing balance support: Bilateral upper extremity supported Standing balance-Leahy Scale: Poor Standing balance comment: reliant on BUE support and external assist                            Cognition Arousal/Alertness: Awake/alert Behavior During Therapy: Flat affect Overall Cognitive Status: Impaired/Different from baseline Area of Impairment: Problem solving;Awareness;Safety/judgement;Attention;Following commands               Rancho Levels of Cognitive Functioning Rancho Los Amigos Scales of Cognitive Functioning: Automatic/appropriate   Current Attention Level: Sustained Memory: Decreased short-term memory Following Commands: Follows one step commands inconsistently;Follows one step commands with increased time Safety/Judgement: Decreased awareness of safety;Decreased awareness of deficits Awareness: Intellectual Problem Solving: Requires verbal cues;Requires tactile cues;Difficulty sequencing;Decreased initiation;Slow processing General Comments: requires consistent cues for sequencing;pt slow to respond, flat affect throughout session;      Exercises Other Exercises Other Exercises: bil LE ROM exercise with graded resistance in gross extention.    General Comments General comments (skin integrity, edema, etc.): pt's daught and grand daughter present for therapy session  Pertinent Vitals/Pain Pain Assessment: No/denies  pain Faces Pain Scale: No hurt Pain Intervention(s): Limited activity within patient's tolerance    Home Living Family/patient expects to be discharged to:: Private residence Living Arrangements: Alone                  Prior Function            PT Goals (current goals can now be found in the care plan section) Acute Rehab PT Goals Patient Stated Goal: CIR today PT Goal Formulation: With patient/family Time For Goal Achievement: 08/25/19 Potential to Achieve Goals: Good Progress towards PT goals: Progressing toward goals    Frequency    Min 3X/week      PT Plan Current plan remains appropriate    Co-evaluation PT/OT/SLP Co-Evaluation/Treatment: Yes Reason for Co-Treatment: Complexity of the patient's impairments (multi-system involvement) PT goals addressed during session: Mobility/safety with mobility OT goals addressed during session: ADL's and self-care      AM-PAC PT "6 Clicks" Mobility   Outcome Measure  Help needed turning from your back to your side while in a flat bed without using bedrails?: A Lot Help needed moving from lying on your back to sitting on the side of a flat bed without using bedrails?: Total Help needed moving to and from a bed to a chair (including a wheelchair)?: Total Help needed standing up from a chair using your arms (e.g., wheelchair or bedside chair)?: Total Help needed to walk in hospital room?: Total Help needed climbing 3-5 steps with a railing? : Total 6 Click Score: 7    End of Session   Activity Tolerance: Patient tolerated treatment well Patient left: with family/visitor present;in bed;with call bell/phone within reach Nurse Communication: Mobility status PT Visit Diagnosis: Unsteadiness on feet (R26.81);Muscle weakness (generalized) (M62.81)     Time: 6553-7482 PT Time Calculation (min) (ACUTE ONLY): 24 min  Charges:  $Therapeutic Activity: 8-22 mins                     08/16/2019  Ginger Carne., PT Acute  Rehabilitation Services [phone]  (pager) [phone]  (office)   Tessie Fass [name] 08/16/2019, 5:32 PM

## 2019-08-16 NOTE — H&P (Signed)
Physical Medicine and Rehabilitation Admission H&P    Chief Complaint  Patient presents with  . Code Stroke  : HPI: Roger Ramirez is a 74 year old right-handed male with history of saddle pulmonary emboli from presumed antiphospholipid antibody syndrome maintained on Xarelto,  tobacco abuse, hypertension, diabetes mellitus, CVA 2020 with reported cognitive deficits. History taken from chart review and daughter due to processing. Patient lives alone independent prior to admission.  He has a son and daughter in the area who check on him as needed and prepare meals.  1 level home 6 steps to entry. He presented on 4/59/21 with right hemiparesis, dysarthria, and AMS. Cranial CT scan unremarkable for acute intracranial process. MRI showed acute infarction affecting the left body of the corpus callosum.  CT angiogram head and neck with atherosclerotic irregularity of the left A3 ACA with high-grade stenosis with suspected subsequent occlusion. Echocardiogram with ejection fraction of 65%. Patient did not receive TPA.  Follow-up MRI completed 08/15/2019 showed progression on left anterior cerebral artery infarction  since the MRI of 08/10/2019 no associated hemorrhage.  Neurology consulted with follow-up and currently on Eliquis for CVA prophylaxis.  Tolerating a regular diet.  Therapy evaluations completed and patient was admitted for a comprehensive rehab program. Please see preadmission assessment from earlier today as well.  Review of Systems  Constitutional: Negative for chills and fever.  HENT: Negative for hearing loss.   Eyes: Negative for blurred vision and double vision.  Respiratory: Negative for cough and shortness of breath.   Cardiovascular: Negative for chest pain, palpitations and leg swelling.  Gastrointestinal: Positive for constipation. Negative for heartburn, nausea and vomiting.  Genitourinary: Positive for urgency. Negative for dysuria, flank pain and hematuria.  Musculoskeletal:  Positive for myalgias.  Skin: Negative for rash.  Neurological: Positive for speech change and focal weakness. Negative for sensory change.  Psychiatric/Behavioral: Positive for memory loss. The patient has insomnia.    Past Medical History:  Diagnosis Date  . Antiphospholipid syndrome ([place])   . CVA (cerebral vascular accident) ([place]) 06/07/2019  . Diabetes mellitus without complication ([place])   . Hypertension   . Pulmonary embolism ([facility])    No pertinent surgical history related to stroke. Family History  Problem Relation Age of Onset  . Hypertension Mother   . Hypertension Father   . Dementia Maternal Grandmother   . Stroke Maternal Grandmother    Social History:  reports that he has quit smoking. His smoking use included cigarettes. He has a 20.00 pack-year smoking history. He has never used smokeless tobacco. He reports previous alcohol use. He reports that he does not use drugs. Allergies: No Known Allergies Medications Prior to Admission  Medication Sig Dispense Refill  . ACCU-CHEK AVIVA PLUS test strip 1 each by Other route daily.     . Accu-Chek FastClix Lancets MISC 1 each by Other route in the morning, at noon, in the evening, and at bedtime.     Marland Kitchen aspirin EC 81 MG tablet Take 81 mg by mouth daily.    Marland Kitchen atorvastatin (LIPITOR) 10 MG tablet Take 10 mg by mouth daily.    . blood glucose meter kit and supplies KIT Dispense based on patient and insurance preference. Use up to four times daily as directed. (FOR ICD-9 250.00, 250.01). (Patient taking differently: 1 each by Other route See admin instructions. Dispense based on patient and insurance preference. Use up to four times daily as directed. (FOR ICD-9 250.00, 250.01).) 1 each 0  . irbesartan (AVAPRO) 150  MG tablet Take 75 mg by mouth daily.    Marland Kitchen levothyroxine (SYNTHROID) 75 MCG tablet Take 75 mcg by mouth daily.    . metFORMIN (GLUCOPHAGE) 1000 MG tablet Take 1,000 mg by mouth 2 (two) times daily.    . Omega-3 Fatty Acids  (FISH OIL) 1200 MG CAPS Take 1,200 mg by mouth daily.    . rivaroxaban (XARELTO) 20 MG TABS tablet Take 1 tablet (20 mg total) by mouth daily. 90 tablet 3  . vitamin B-12 1000 MCG tablet Take 1 tablet (1,000 mcg total) by mouth daily. 30 tablet 0    Drug Regimen Review Drug regimen was reviewed and remains appropriate with no significant issues identified  Home: Home Living Family/patient expects to be discharged to:: Private residence Living Arrangements: Alone(girlfriend came on wekeends and family checked in on him dai) Available Help at Discharge: Family, Available 24 hours/day(daughter, son, and granddaughter to asist with 24/7 as neede) Type of Home: House Home Access: Stairs to enter Technical brewer of Steps: 5 Entrance Stairs-Rails: Right, Left Home Layout: One level Bathroom Shower/Tub: Optometrist: Yes  Lives With: Alone   Functional History: Prior Function Level of Independence: Independent Comments: had recovered gait to walk with no AD outside after first CVA  Functional Status:  Mobility: Bed Mobility Overal bed mobility: Needs Assistance Bed Mobility: Sit to Supine, Rolling, Supine to Sit Rolling: Min assist Supine to sit: Max assist, HOB elevated Sit to supine: +2 for physical assistance, Total assist General bed mobility comments: pt initiated movement to EOB; requires max A and multimodal cues for supine to sit with use of rail; total A +2 to return to bed and granddaughter (works nights on 3W) assisted as pt was sliding off EOB after attempt to stand Transfers Overall transfer level: Needs assistance Equipment used: Rolling walker (2 wheeled) Transfers: Sit to/from Stand Sit to Stand: Max assist, From elevated surface General transfer comment: pt unable to stand despite cues and assistance  Ambulation/Gait Ambulation/Gait assistance: Min assist, Mod assist Gait Distance (Feet): 60  Feet Assistive device: Rolling walker (2 wheeled) Gait Pattern/deviations: Step-through pattern, Decreased dorsiflexion - right, Decreased stance time - right, Trunk flexed General Gait Details: Pt required cues for R foot height and step length.  LOB with L turn required moderate assistance to correct. Gait velocity: reduced Gait velocity interpretation: <1.31 ft/sec, indicative of household ambulator    ADL: ADL Overall ADL's : Needs assistance/impaired Eating/Feeding: Minimal assistance, Bed level Grooming: Moderate assistance, Bed level Upper Body Bathing: Maximal assistance, Bed level Lower Body Bathing: Maximal assistance, +2 for physical assistance, Bed level Upper Body Dressing : Moderate assistance, Bed level Lower Body Dressing: Maximal assistance, +2 for physical assistance, Bed level Functional mobility during ADLs: Maximal assistance, +2 for physical assistance, Rolling walker General ADL Comments: session focus on functional sit<>stands and sitting balance; dtr reports pt had ambien likely affecting his level of function as pt required MAX A +2 to sit<>stand briefly from EOB. Pt able to sit EOB ~ 2 mins BUE support and MIN A  Cognition: Cognition Overall Cognitive Status: Impaired/Different from baseline Arousal/Alertness: Awake/alert Orientation Level: Disoriented to time, Oriented to person, Oriented to place, Oriented to situation Attention: Focused Focused Attention: Impaired Focused Attention Impairment: Verbal basic Memory: Impaired Memory Impairment: Decreased recall of new information, Decreased short term memory Decreased Short Term Memory: Verbal basic Immediate Memory Recall: Sock, Blue, Bed Memory Recall Sock: Without Cue Memory Recall Blue: Without Cue Memory Recall Bed:  With Cue Awareness: Appears intact Problem Solving: Impaired Problem Solving Impairment: Functional basic, Functional complex, Verbal complex Safety/Judgment:  Impaired Cognition Arousal/Alertness: Awake/alert Behavior During Therapy: Flat affect Overall Cognitive Status: Impaired/Different from baseline Area of Impairment: Problem solving, Awareness, Safety/judgement, Attention, Following commands Current Attention Level: Sustained Memory: Decreased short-term memory Following Commands: Follows one step commands inconsistently, Follows one step commands with increased time Safety/Judgement: Decreased awareness of safety, Decreased awareness of deficits Awareness: Intellectual Problem Solving: Requires verbal cues, Requires tactile cues, Difficulty sequencing, Decreased initiation, Slow processing General Comments: distracted by TV so turned it off and pt more focused on mobility  Physical Exam: Blood pressure 120/67, pulse 78, temperature 97.9 F (36.6 C), temperature source Oral, resp. rate 20, height 5' 9"  (1.753 m), weight 97.6 kg, SpO2 96 %. Physical Exam  Vitals reviewed. Constitutional: He appears well-developed and well-nourished.  HENT:  Head: Normocephalic and atraumatic.  Eyes: EOM are normal. Right eye exhibits no discharge.  Neck: No tracheal deviation present. No thyromegaly present.  Respiratory: Effort normal. No stridor. No respiratory distress.  GI: Soft. He exhibits no distension.  Musculoskeletal:     Comments: No edema or tenderness in extremities  Neurological: He is alert.  Dysarthria  He does display some decrease in attention and limited awareness.   Follows commands.   Oriented to person and place. Motor: Left upper extremity: 4+/5 proximal distal Left lower extremity: 4+/5 proximal distal Right upper extremity: 3+/5 proximal distal Right lower extremity: Hip flexion, knee extension 2/5, ankle dorsiflexion 3/5 with apraxia HOH  Skin: Skin is warm and dry.  Psychiatric: His affect is blunt. His speech is delayed. He is slowed.    Results for orders placed or performed during the hospital encounter of  08/10/19 (from the past 48 hour(s))  Glucose, capillary     Status: Abnormal   Collection Time: 08/14/19  4:29 PM  Result Value Ref Range   Glucose-Capillary 154 (H) 70 - 99 mg/dL    Comment: Glucose reference range applies only to samples taken after fasting for at least 8 hours.  Glucose, capillary     Status: Abnormal   Collection Time: 08/14/19  9:08 PM  Result Value Ref Range   Glucose-Capillary 156 (H) 70 - 99 mg/dL    Comment: Glucose reference range applies only to samples taken after fasting for at least 8 hours.   Comment 1 Notify RN    Comment 2 Document in Chart   Glucose, capillary     Status: Abnormal   Collection Time: 08/15/19  6:13 AM  Result Value Ref Range   Glucose-Capillary 121 (H) 70 - 99 mg/dL    Comment: Glucose reference range applies only to samples taken after fasting for at least 8 hours.   Comment 1 Notify RN    Comment 2 Document in Chart   Glucose, capillary     Status: Abnormal   Collection Time: 08/15/19 11:38 AM  Result Value Ref Range   Glucose-Capillary 120 (H) 70 - 99 mg/dL    Comment: Glucose reference range applies only to samples taken after fasting for at least 8 hours.  Glucose, capillary     Status: Abnormal   Collection Time: 08/15/19  3:59 PM  Result Value Ref Range   Glucose-Capillary 125 (H) 70 - 99 mg/dL    Comment: Glucose reference range applies only to samples taken after fasting for at least 8 hours.  Glucose, capillary     Status: Abnormal   Collection Time: 08/15/19  9:33  PM  Result Value Ref Range   Glucose-Capillary 137 (H) 70 - 99 mg/dL    Comment: Glucose reference range applies only to samples taken after fasting for at least 8 hours.   Comment 1 Notify RN    Comment 2 Document in Chart   CBC     Status: None   Collection Time: 08/16/19  5:31 AM  Result Value Ref Range   WBC 8.6 4.0 - 10.5 K/uL   RBC 4.77 4.22 - 5.81 MIL/uL   Hemoglobin 14.6 13.0 - 17.0 g/dL   HCT 43.4 39.0 - 52.0 %   MCV 91.0 80.0 - 100.0 fL    MCH 30.6 26.0 - 34.0 pg   MCHC 33.6 30.0 - 36.0 g/dL   RDW 13.5 11.5 - 15.5 %   Platelets 212 150 - 400 K/uL   nRBC 0.0 0.0 - 0.2 %    Comment: Performed at [facility], [place] [address], [place], [place] [address]  Basic metabolic panel     Status: Abnormal   Collection Time: 08/16/19  5:31 AM  Result Value Ref Range   Sodium 133 (L) 135 - 145 mmol/L   Potassium 4.1 3.5 - 5.1 mmol/L   Chloride 103 98 - 111 mmol/L   CO2 22 22 - 32 mmol/L   Glucose, Bld 123 (H) 70 - 99 mg/dL    Comment: Glucose reference range applies only to samples taken after fasting for at least 8 hours.   BUN 18 8 - 23 mg/dL   Creatinine, Ser 0.81 0.61 - 1.24 mg/dL   Calcium 9.1 8.9 - 10.3 mg/dL   GFR calc non Af Amer >60 >60 mL/min   GFR calc Af Amer >60 >60 mL/min   Anion gap 8 5 - 15    Comment: Performed at [facility] [address], [place], [place] [address]  Glucose, capillary     Status: Abnormal   Collection Time: 08/16/19  6:06 AM  Result Value Ref Range   Glucose-Capillary 112 (H) 70 - 99 mg/dL    Comment: Glucose reference range applies only to samples taken after fasting for at least 8 hours.   Comment 1 Notify RN    Comment 2 Document in Chart   Glucose, capillary     Status: Abnormal   Collection Time: 08/16/19 11:32 AM  Result Value Ref Range   Glucose-Capillary 181 (H) 70 - 99 mg/dL    Comment: Glucose reference range applies only to samples taken after fasting for at least 8 hours.   Comment 1 Notify RN    Comment 2 Document in Chart    MR BRAIN WO CONTRAST  Result Date: 08/15/2019 CLINICAL DATA:  Stroke with worsening symptoms.  Recent stroke. EXAM: MRI HEAD WITHOUT CONTRAST TECHNIQUE: Multiplanar, multiecho pulse sequences of the brain and surrounding structures were obtained without intravenous contrast. COMPARISON:  MRI 08/10/2019. FINDINGS: Brain: Interval extension of left anterior cerebral artery territory infarct. Large area of infarct now in the body of the corpus  callosum on the left. There is also infarct in the left posterior frontal medial lobe compatible with distal left MCA infarct. Note is made of severe stenosis left A3 segment on CTA. Generalized atrophy which is severe in the temporal lobes. Ventricular enlargement consistent with volume loss in the brain. Negative for hemorrhage or mass lesion. Vascular: Normal arterial flow voids Skull and upper cervical spine: No focal skeletal lesion. Sinuses/Orbits: Paranasal sinuses clear.  No orbital lesion Other: None IMPRESSION:  Left anterior cerebral artery territory infarct has progressed since the recent MRI of 08/10/2019. No associated hemorrhage Generalized atrophy which is severe in the temporal lobes. Chronic microvascular ischemic change in the white matter. Electronically Signed   By: Franchot Gallo M.D.   On: 08/15/2019 18:37    Medical Problem List and Plan: 1.  Right side weakness with dysarthria secondary to left ACA territory acute infarction as well as history of CVA 2020 with cognitive deficits  -patient may shower  -ELOS/Goals: 16-19 days/min/Mod a  Admit to CIR 2.  Antithrombotics: -DVT/anticoagulation: Eliquis  -antiplatelet therapy: N/A 3. Pain Management: Tylenol as needed 4. Mood: Melatonin 3 mg nightly  -antipsychotic agents: N/A 5. Neuropsych: This patient is capable of making decisions on his own behalf. 6. Skin/Wound Care: Routine skin checks 7. Fluids/Electrolytes/Nutrition: Routine in and outs. CMP ordered. 8.  History of saddle pulmonary emboli.  Continue Eliquis 9.  Diabetes mellitus.  Hemoglobin A1c 6.1.  SSI.  Patient on Glucophage 1000 mg twice daily prior to admission.  Resume as needed  Monitor with increased mobility 10.  Hypertension.  Avapro 75 mg daily.    Monitor with increased mobility 11.  Hypothyroidism. Continue Synthroid 12.  Hyperlipidemia. Lipitor 13.  History of tobacco abuse.  Counseling  Lavon Paganini [place], PA-C 08/16/2019  I have personally  performed a face to face diagnostic evaluation, including, but not limited to relevant history and physical exam findings, of this patient and developed relevant assessment and plan.  Additionally, I have reviewed and concur with the physician assistant's documentation above.  Delice Lesch, MD, ABPMR  The patient's status has not changed. The original post admission physician evaluation remains appropriate, and any changes from the pre-admission screening or documentation from the acute chart are noted above.   Delice Lesch, MD, ABPMR

## 2019-08-16 NOTE — Progress Notes (Signed)
STROKE TEAM PROGRESS NOTE   INTERVAL HISTORY Daughter is at bedside. Pt lying in bed, awake but not fully orientated. He has mild right facial droop and right hemiparesis. Seems worse than several days ago. MRI showed mild extension of previous left ACA infarcts. On DAPT, will recommend CIR placement.   Vitals:   08/16/19 0440 08/16/19 0858 08/16/19 1030 08/16/19 1130  BP: 113/78 123/80 130/79 120/67  Pulse: 72 71 96 78  Resp: 18 20 20 20   Temp: 97.8 F (36.6 C) 98.5 F (36.9 C) 98.5 F (36.9 C) 97.9 F (36.6 C)  TempSrc: Oral Oral  Oral  SpO2: 94% 95% 95% 96%  Weight:      Height:       CBC:  Recent Labs  Lab 08/10/19 0926 08/10/19 0926 08/10/19 0927 08/16/19 0531  WBC 10.9*  --   --  8.6  NEUTROABS 9.2*  --   --   --   HGB 14.8   < > 15.3 14.6  HCT 45.4   < > 45.0 43.4  MCV 91.2  --   --  91.0  PLT 197  --   --  212   < > = values in this interval not displayed.    Basic Metabolic Panel:  Recent Labs  Lab 08/10/19 0926 08/10/19 0926 08/10/19 0927 08/16/19 0531  NA 136   < > 137 133*  K 4.3   < > 4.5 4.1  CL 105   < > 104 103  CO2 20*  --   --  22  GLUCOSE 139*   < > 133* 123*  BUN 12   < > 15 18  CREATININE 0.96   < > 0.70 0.81  CALCIUM 9.4  --   --  9.1   < > = values in this interval not displayed.    IMAGING past 24 hours MR BRAIN WO CONTRAST  Result Date: 08/15/2019 CLINICAL DATA:  Stroke with worsening symptoms.  Recent stroke. EXAM: MRI HEAD WITHOUT CONTRAST TECHNIQUE: Multiplanar, multiecho pulse sequences of the brain and surrounding structures were obtained without intravenous contrast. COMPARISON:  MRI 08/10/2019. FINDINGS: Brain: Interval extension of left anterior cerebral artery territory infarct. Large area of infarct now in the body of the corpus callosum on the left. There is also infarct in the left posterior frontal medial lobe compatible with distal left MCA infarct. Note is made of severe stenosis left A3 segment on CTA. Generalized  atrophy which is severe in the temporal lobes. Ventricular enlargement consistent with volume loss in the brain. Negative for hemorrhage or mass lesion. Vascular: Normal arterial flow voids Skull and upper cervical spine: No focal skeletal lesion. Sinuses/Orbits: Paranasal sinuses clear.  No orbital lesion Other: None IMPRESSION: Left anterior cerebral artery territory infarct has progressed since the recent MRI of 08/10/2019. No associated hemorrhage Generalized atrophy which is severe in the temporal lobes. Chronic microvascular ischemic change in the white matter. Electronically Signed   By: 08/12/2019 M.D.   On: 08/15/2019 18:37    PHYSICAL EXAM   Temp:  [97.4 F (36.3 C)-98.5 F (36.9 C)] 97.4 F (36.3 C) (05/05 1604) Pulse Rate:  [69-101] 86 (05/05 1604) Resp:  [17-20] 19 (05/05 1604) BP: (113-133)/(62-86) 131/62 (05/05 1604) SpO2:  [94 %-96 %] 96 % (05/05 1604) Weight:  [93.6 kg] 93.6 kg (05/05 1550)  General - Well nourished, well developed, in no apparent distress.  Ophthalmologic - fundi not visualized due to noncooperation.  Cardiovascular - Regular rhythm and  rate.  Mental Status -  Level of arousal and orientation to place, and person were intact, however not orientated to age and time. Language including expression, naming, repetition, comprehension was assessed and found intact.  Cranial Nerves II - XII - II - Visual field intact OU. III, IV, VI - Extraocular movements intact. V - Facial sensation intact bilaterally. VII - mild right facial droop. VIII - Hearing & vestibular intact bilaterally. X - Palate elevates symmetrically. XI - Chin turning & shoulder shrug intact bilaterally. XII - Tongue protrusion intact.  Motor Strength - The patient's strength was normal in LUE and LLE, however, RUE drift to bed within 10 sec, proximal 3/5 and distal 3+/5. RLE 4/5 proximal and 3/5 distally. Bulk was normal and fasciculations were absent.   Motor Tone - Muscle tone was  assessed at the neck and appendages and was normal.  Reflexes - The patient's reflexes were symmetrical in all extremities and he had no pathological reflexes.  Sensory - Light touch, temperature/pinprick were assessed and were symmetrical.    Coordination - The patient had normal movements in the left hand with no ataxia or dysmetria. However, dysmetria on the right FTN but still not out of proportion to weakness. Tremor was absent.  Gait and Station - deferred.   ASSESSMENT/PLAN [name] is a 74 y.o. male with history of PE on Xarelto, antiphospholipid syndrome, hypertension, diabetes, previous stroke in 2020 presenting with waking up on the floor and noted to have right-sided weakness, worse in lower extremity.  Patient was not a TPA candidate due to being on Xarelto at home. Followed by stroke team until sign off 4/30. Re-saw on 5/5 prior to d/c to CIR following repeat MRI w/ increase in infarct size. Pt stable for transfer CIR. AC w/ eliquis.   Stroke - acute infarct of left body of corpus callosum - likely embolic due to APL syndrome even on Xarelto  Code Stroke No acute abnormality.   MRI  Acute infarction affecting the left body of the corpus callosum. Brain atrophy with temporal lobe predominance.   MRI repeat - left ACA territory infarct as well as disease recent MRI  2D Echo EF 60-65%, normal  Repeat MRI 5/4 with slight increase in size of infarct  LDL 57  HgbA1c 6.1  Eliquis for VTE prophylaxis  Xarelto (rivaroxaban) daily prior to admission, changed to Eliquis (apixaban) daily due to insurance coverage. Continue on discharge.  Therapy recommendations:  CIR   Disposition:  CIR   APL syndrome  Hx of PE  On Xarelto PTA  Now changed to eliquis (not able to get coverage for pradaxa)  Following with hematology  May consider coumadin with higher INR goal if continue to have recurrent stroke in the future   History of stroke  05/2018 left CR small  infarct, MRA neg and CUS neg. EF 50-55%. LDL 56 and A1C 8.4. put on Xarelto and ASA 81 along with lipitor 10  Hypertension  Stable  Avoid low BP . BP goal normotensive  Hyperlipidemia  Home meds:  Atorvastatin 40, resumed in hospital  LDL 57, goal < 70  Continue statin at discharge  Diabetes type II Controlled  HgbA1c 6.1, goal < 7.0  CBGs  SSI  Close PCP follow up  Vascular dementia with behavioral Disturbance  Melatonin at hs  Monitor  Other Stroke Risk Factors  Advanced age  Obesity, Body mass index is 31.77 kg/m., recommend weight loss, diet and exercise as appropriate   Coronary  artery disease  Hospital day # 6   Neurology will sign off. Please call with questions. Pt will follow up with stroke clinic NP at [facility] in about 4 weeks. Thanks for the consult.   Rosalin Hawking, MD PhD Stroke Neurology 08/16/2019 6:33 PM   To contact Stroke Continuity provider, please refer to [email or web address] After hours, contact General Neurology

## 2019-08-16 NOTE — Discharge Summary (Signed)
Physician Discharge Summary  Roger Ramirez OMA:[id number] DOB: 1945/12/05 DOA: 08/10/2019  PCP: Seward Carol, MD  Admit date: 08/10/2019 Discharge date: 08/16/2019  Admitted From: Home Disposition: Acute inpatient rehab  Recommendations for Outpatient Follow-up:  1. Schedule follow-up with neurology on discharge  Discharge Condition: Stable CODE STATUS: DNR Diet recommendation: Low-salt, low-carb diet  Discharge summary: 74 year old gentleman with history of saddle pulmonary embolism on Xarelto, hypertension, diabetes and history of stroke in 2020 brought to the emergency room as a code stroke.  Patient lives at home alone, he is quite ambulatory and independent.  His family check on him.  He was found with right-sided facial droop and slurred speech with unknown last normal time.  Patient did have some confusion on presentation.  In the emergency room, code stroke was called.  Not a TPA candidate because of unknown last normal as well as being on Xarelto.  Otherwise hemodynamically stable.  # Acute left ACA ischemic stroke: Clinical findings, dysarthria, confusion and right hemiparesis. CT head findings, no acute findings.  Severe temporal lobe atrophy.  Chronic small vessel disease. MRI of the brain, left ACA territory stroke.  Repeat MRI, 08/15/2019 with slight increase of size of the stroke. CTA of the head and neck:Atherosclerotic disease, high-grade stenosis A3 ACA.  Calcified plaque at the ICA origin. 2D echocardiogram, normal.  No source of embolism. Antiplatelet therapy, on Xarelto and aspirin at home.  Neurology recommended changing anticoagulation regimen. Insurance not covering for Pradaxa, changing to Eliquis.  Once on Eliquis, discontinue aspirin due to no added benefit but increased risk of bleeding. LDL, 57 already on a statin.   Hemoglobin A1c, 6.1.  Patient on Metformin at home.    Resume Metformin.     Hypertension: Blood pressures acceptable.    Resume home  medication.  Hyperlipidemia: On Lipitor 10 mg.  Increase to 40 mg. LDL 57.  Appropriate.  History of saddle PE: As above.  Now on Eliquis..    Dementia with behavioral disturbances: Delirium precautions.  Fall precautions. His behavior is well controlled.  Melatonin did help at nighttime symptoms. Family requested to use melatonin before 8:00 so he has good morning to work with PT.   Discussed with neurology.  Case was reviewed.  Medically stable to transfer to acute inpatient rehab level of care.  Discharge Diagnoses:  Principal Problem:   CVA (cerebral vascular accident) ([place]) Active Problems:   Hypercholesterolemia with hypertriglyceridemia   Hypothyroid   Cognitive dysfunction   Antiphospholipid syndrome ([place])   Diabetes mellitus type 2, noninsulin dependent (HCC)   Obesity (BMI 30.0-34.9)   Acute ischemic stroke (HCC)   History of CVA (cerebrovascular accident)   History of pulmonary embolism   Tobacco abuse   Essential hypertension   Benign essential HTN    Discharge Instructions  Discharge Instructions    Ambulatory referral to Neurology   Complete by: As directed    Follow up with Dr. Leonie Man at [facility] in 4-6 weeks. Too complicated for NP to follow. Thanks.   Diet - low sodium heart healthy   Complete by: As directed    Diet Carb Modified   Complete by: As directed    Increase activity slowly   Complete by: As directed      Allergies as of 08/16/2019   No Known Allergies     Medication List    STOP taking these medications   Accu-Chek Aviva Plus test strip Generic drug: glucose blood   Accu-Chek FastClix Lancets Misc   aspirin EC  81 MG tablet   blood glucose meter kit and supplies Kit   rivaroxaban 20 MG Tabs tablet Commonly known as: XARELTO     TAKE these medications   apixaban 5 MG Tabs tablet Commonly known as: ELIQUIS Take 1 tablet (5 mg total) by mouth 2 (two) times daily.   atorvastatin 40 MG tablet Commonly known as: LIPITOR Take 1  tablet (40 mg total) by mouth daily. Start taking on: Aug 17, 2019 What changed:   medication strength  how much to take   cyanocobalamin 1000 MCG tablet Take 1 tablet (1,000 mcg total) by mouth daily.   Fish Oil 1200 MG Caps Take 1,200 mg by mouth daily.   irbesartan 150 MG tablet Commonly known as: AVAPRO Take 75 mg by mouth daily.   levothyroxine 75 MCG tablet Commonly known as: SYNTHROID Take 75 mcg by mouth daily.   melatonin 3 MG Tabs tablet Take 1 tablet (3 mg total) by mouth at bedtime as needed (sleep. prefer to give at 8 PM to avoid morning sleepiness).   metFORMIN 1000 MG tablet Commonly known as: GLUCOPHAGE Take 1,000 mg by mouth 2 (two) times daily.      Follow-up Information    Garvin Fila, MD. Schedule an appointment as soon as possible for a visit in 4 week(s).   Specialties: Neurology, Radiology Contact information: [address] [place] [address] [phone]          No Known Allergies  Consultations:  Neurology   Procedures/Studies: CT ANGIO HEAD W OR WO CONTRAST  Result Date: 08/10/2019 CLINICAL DATA:  Code stroke follow-up, right facial droop and leg weakness EXAM: CT ANGIOGRAPHY HEAD AND NECK TECHNIQUE: Multidetector CT imaging of the head and neck was performed using the standard protocol during bolus administration of intravenous contrast. Multiplanar CT image reconstructions and MIPs were obtained to evaluate the vascular anatomy. Carotid stenosis measurements (when applicable) are obtained utilizing NASCET criteria, using the distal internal carotid diameter as the denominator. CONTRAST:  32m OMNIPAQUE IOHEXOL 350 MG/ML SOLN COMPARISON:  None. FINDINGS: CTA NECK Aortic arch: Calcified plaque along the arch and patent great vessel origins. Common origin of the innominate and left common carotid arteries. There is irregular noncalcified plaque along the left subclavian origin with mild stenosis. Right carotid system:  Patent. Primarily calcified plaque along the proximal ICA causing less than 50% stenosis. Left carotid system: Patent. Atherosclerotic wall thickening along the common carotid. Primarily calcified plaque along the proximal ICA causing less than 50% stenosis. Vertebral arteries: Patent. Skeleton: Degenerative changes of the cervical spine. There is partial fusion of the C4 and C5 vertebral bodies. Other neck: No mass or adenopathy. Upper chest: No apical lung mass. Review of the MIP images confirms the above findings CTA HEAD Anterior circulation: Intracranial internal carotid arteries are patent with calcified plaque causing mild to moderate stenosis. Middle cerebral arteries are patent with mild atherosclerotic irregularity more distally. Right anterior cerebral artery is patent. The proximal left anterior cerebral artery is patent. There is atherosclerotic irregularity of the left A3 ACA with high-grade stenosis and probable subsequent occlusion. Posterior circulation: Intracranial vertebral arteries, basilar artery, and posterior cerebral arteries are patent. Small posterior communicating arteries are present. There is short segment high-grade stenosis of the proximal left P2 PCA. Venous sinuses: As permitted by contrast timing, patent. Review of the MIP images confirms the above findings IMPRESSION: Atherosclerotic irregularity of left A3 ACA with high-grade stenosis and suspected subsequent occlusion. Short segment high-grade stenosis proximal  left P2 PCA. Calcified plaque at the ICA origins causing less than 50% stenosis. Initial is results were discussed by telephone at the time of interpretation on 08/10/2019 at 9:54 am to provider Dr. Lorraine Lax, who verbally acknowledged these results. Electronically Signed   By: Macy Mis M.D.   On: 08/10/2019 10:07   CT ANGIO NECK W OR WO CONTRAST  Result Date: 08/10/2019 CLINICAL DATA:  Code stroke follow-up, right facial droop and leg weakness EXAM: CT ANGIOGRAPHY  HEAD AND NECK TECHNIQUE: Multidetector CT imaging of the head and neck was performed using the standard protocol during bolus administration of intravenous contrast. Multiplanar CT image reconstructions and MIPs were obtained to evaluate the vascular anatomy. Carotid stenosis measurements (when applicable) are obtained utilizing NASCET criteria, using the distal internal carotid diameter as the denominator. CONTRAST:  49m OMNIPAQUE IOHEXOL 350 MG/ML SOLN COMPARISON:  None. FINDINGS: CTA NECK Aortic arch: Calcified plaque along the arch and patent great vessel origins. Common origin of the innominate and left common carotid arteries. There is irregular noncalcified plaque along the left subclavian origin with mild stenosis. Right carotid system: Patent. Primarily calcified plaque along the proximal ICA causing less than 50% stenosis. Left carotid system: Patent. Atherosclerotic wall thickening along the common carotid. Primarily calcified plaque along the proximal ICA causing less than 50% stenosis. Vertebral arteries: Patent. Skeleton: Degenerative changes of the cervical spine. There is partial fusion of the C4 and C5 vertebral bodies. Other neck: No mass or adenopathy. Upper chest: No apical lung mass. Review of the MIP images confirms the above findings CTA HEAD Anterior circulation: Intracranial internal carotid arteries are patent with calcified plaque causing mild to moderate stenosis. Middle cerebral arteries are patent with mild atherosclerotic irregularity more distally. Right anterior cerebral artery is patent. The proximal left anterior cerebral artery is patent. There is atherosclerotic irregularity of the left A3 ACA with high-grade stenosis and probable subsequent occlusion. Posterior circulation: Intracranial vertebral arteries, basilar artery, and posterior cerebral arteries are patent. Small posterior communicating arteries are present. There is short segment high-grade stenosis of the proximal  left P2 PCA. Venous sinuses: As permitted by contrast timing, patent. Review of the MIP images confirms the above findings IMPRESSION: Atherosclerotic irregularity of left A3 ACA with high-grade stenosis and suspected subsequent occlusion. Short segment high-grade stenosis proximal left P2 PCA. Calcified plaque at the ICA origins causing less than 50% stenosis. Initial is results were discussed by telephone at the time of interpretation on 08/10/2019 at 9:54 am to provider Dr. ALorraine Lax who verbally acknowledged these results. Electronically Signed   By: PMacy MisM.D.   On: 08/10/2019 10:07   MR BRAIN WO CONTRAST  Result Date: 08/15/2019 CLINICAL DATA:  Stroke with worsening symptoms.  Recent stroke. EXAM: MRI HEAD WITHOUT CONTRAST TECHNIQUE: Multiplanar, multiecho pulse sequences of the brain and surrounding structures were obtained without intravenous contrast. COMPARISON:  MRI 08/10/2019. FINDINGS: Brain: Interval extension of left anterior cerebral artery territory infarct. Large area of infarct now in the body of the corpus callosum on the left. There is also infarct in the left posterior frontal medial lobe compatible with distal left MCA infarct. Note is made of severe stenosis left A3 segment on CTA. Generalized atrophy which is severe in the temporal lobes. Ventricular enlargement consistent with volume loss in the brain. Negative for hemorrhage or mass lesion. Vascular: Normal arterial flow voids Skull and upper cervical spine: No focal skeletal lesion. Sinuses/Orbits: Paranasal sinuses clear.  No orbital lesion Other: None IMPRESSION: Left anterior  cerebral artery territory infarct has progressed since the recent MRI of 08/10/2019. No associated hemorrhage Generalized atrophy which is severe in the temporal lobes. Chronic microvascular ischemic change in the white matter. Electronically Signed   By: Franchot Gallo M.D.   On: 08/15/2019 18:37   MR BRAIN WO CONTRAST  Result Date:  08/10/2019 CLINICAL DATA:  Right facial droop, slurred speech and confusion today. Negative acute CT evaluation. EXAM: MRI HEAD WITHOUT CONTRAST TECHNIQUE: Multiplanar, multiecho pulse sequences of the brain and surrounding structures were obtained without intravenous contrast. COMPARISON:  CT studies earlier same day.  MRI 06/06/2018. FINDINGS: Brain: Brain atrophy with some temporal lobe predominance. Diffusion imaging shows acute infarction in the left body of the corpus callosum. No other acute infarction. Elsewhere, there are moderate chronic small-vessel ischemic changes of the hemispheric white matter. No mass lesion, hemorrhage, hydrocephalus or extra-axial collection. Vascular: Major vessels at the base of the brain show flow. Skull and upper cervical spine: Negative Sinuses/Orbits: Clear/normal Other: None IMPRESSION: Acute infarction affecting the left body of the corpus callosum. Brain atrophy with temporal lobe predominance. Chronic small-vessel ischemic changes affecting the cerebral hemispheric white matter. Electronically Signed   By: Nelson Chimes M.D.   On: 08/10/2019 10:57   ECHOCARDIOGRAM COMPLETE  Result Date: 08/10/2019    ECHOCARDIOGRAM REPORT   Patient Name:   Roger Ramirez Date of Exam: 08/10/2019 Medical Rec #:  [id number]     Height:       69.0 in Accession #:    [id number]    Weight:       214.9 lb Date of Birth:  July 27, 1945     BSA:          2.130 m Patient Age:    20 years      BP:           189/93 mmHg Patient Gender: M             HR:           66 bpm. Exam Location:  Inpatient Procedure: 2D Echo, Cardiac Doppler and Color Doppler Indications:    Stroke 434.91 / I163.9  History:        Patient has prior history of Echocardiogram examinations, most                 recent 06/06/2018. Risk Factors:Hypertension and Diabetes.                 Pulmonary Embolism.  Sonographer:    Jonelle Sidle Dance Referring Phys: [place]  1. Left ventricular ejection fraction, by  estimation, is 60 to 65%. The left ventricle has normal function. The left ventricle has no regional wall motion abnormalities. Left ventricular diastolic parameters are consistent with Grade I diastolic dysfunction (impaired relaxation). Elevated left ventricular end-diastolic pressure.  2. Right ventricular systolic function is normal. The right ventricular size is normal.  3. The mitral valve is normal in structure. Trivial mitral valve regurgitation. No evidence of mitral stenosis.  4. The aortic valve was not well visualized. Aortic valve regurgitation is not visualized. Moderate aortic valve stenosis. Aortic valve mean gradient measures 23.7 mmHg. Aortic valve Vmax measures 3.19 m/s.  5. The inferior vena cava is normal in size with greater than 50% respiratory variability, suggesting right atrial pressure of 3 mmHg. FINDINGS  Left Ventricle: Left ventricular ejection fraction, by estimation, is 60 to 65%. The left ventricle has normal function. The left ventricle has no regional wall motion abnormalities. The  left ventricular internal cavity size was normal in size. There is  no left ventricular hypertrophy. Left ventricular diastolic parameters are consistent with Grade I diastolic dysfunction (impaired relaxation). Elevated left ventricular end-diastolic pressure. Right Ventricle: The right ventricular size is normal. No increase in right ventricular wall thickness. Right ventricular systolic function is normal. Left Atrium: Left atrial size was normal in size. Right Atrium: Right atrial size was normal in size. Pericardium: There is no evidence of pericardial effusion. Mitral Valve: The mitral valve is normal in structure. Normal mobility of the mitral valve leaflets. Trivial mitral valve regurgitation. No evidence of mitral valve stenosis. Tricuspid Valve: The tricuspid valve is normal in structure. Tricuspid valve regurgitation is not demonstrated. No evidence of tricuspid stenosis. Aortic Valve: The  aortic valve was not well visualized. . There is moderate thickening and moderate calcification of the aortic valve. Aortic valve regurgitation is not visualized. Moderate aortic stenosis is present. There is moderate thickening of the aortic valve. There is moderate calcification of the aortic valve. Aortic valve mean gradient measures 23.7 mmHg. Aortic valve peak gradient measures 40.6 mmHg. Aortic valve area, by VTI measures 0.50 cm. Pulmonic Valve: The pulmonic valve was normal in structure. Pulmonic valve regurgitation is not visualized. No evidence of pulmonic stenosis. Aorta: The aortic root is normal in size and structure. Venous: The inferior vena cava is normal in size with greater than 50% respiratory variability, suggesting right atrial pressure of 3 mmHg. IAS/Shunts: No atrial level shunt detected by color flow Doppler.  LEFT VENTRICLE PLAX 2D LVIDd:         4.98 cm  Diastology LVIDs:         3.81 cm  LV e' lateral:   10.80 cm/s LV PW:         1.08 cm  LV E/e' lateral: 5.2 LV IVS:        0.94 cm  LV e' medial:    5.44 cm/s LVOT diam:     1.50 cm  LV E/e' medial:  10.3 LV SV:         37 LV SV Index:   17 LVOT Area:     1.77 cm  RIGHT VENTRICLE            IVC RV Basal diam:  2.13 cm    IVC diam: 1.76 cm RV S prime:     9.25 cm/s TAPSE (M-mode): 1.7 cm LEFT ATRIUM             Index       RIGHT ATRIUM           Index LA diam:        3.10 cm 1.46 cm/m  RA Area:     10.10 cm LA Vol (A2C):   43.7 ml 20.51 ml/m RA Volume:   16.60 ml  7.79 ml/m LA Vol (A4C):   25.4 ml 11.92 ml/m LA Biplane Vol: 34.5 ml 16.20 ml/m  AORTIC VALVE AV Area (Vmax):    0.49 cm AV Area (Vmean):   0.42 cm AV Area (VTI):     0.50 cm AV Vmax:           318.67 cm/s AV Vmean:          227.667 cm/s AV VTI:            0.736 m AV Peak Grad:      40.6 mmHg AV Mean Grad:      23.7 mmHg LVOT Vmax:  88.70 cm/s LVOT Vmean:        54.700 cm/s LVOT VTI:          0.207 m LVOT/AV VTI ratio: 0.28  AORTA Ao Root diam: 3.40 cm MITRAL  VALVE MV Area (PHT): 3.12 cm     SHUNTS MV Decel Time: 243 msec     Systemic VTI:  0.21 m MV E velocity: 55.80 cm/s   Systemic Diam: 1.50 cm MV A velocity: 112.00 cm/s MV E/A ratio:  0.50 Skeet Latch MD Electronically signed by Skeet Latch MD Signature Date/Time: 08/10/2019/6:25:01 PM    Final    CT HEAD CODE STROKE WO CONTRAST  Result Date: 08/10/2019 CLINICAL DATA:  Code stroke. Right-sided facial droop and right leg weakness EXAM: CT HEAD WITHOUT CONTRAST TECHNIQUE: Contiguous axial images were obtained from the base of the skull through the vertex without intravenous contrast. COMPARISON:  06/06/2018 brain MRI FINDINGS: Brain: No evidence of acute infarction, hemorrhage, hydrocephalus, extra-axial collection or mass lesion/mass effect. Atrophy that is severe in the temporal lobes. Chronic small vessel ischemia with confluent low-density in the deep cerebral white matter and remote lacunar infarct at the right caudate head. Vascular: Atherosclerotic calcification.  No hyperdense vessel Skull: Normal. Negative for fracture or focal lesion. Sinuses/Orbits: Negative Other: These results were communicated to Dr. Lorraine Lax at 9:34 amon 4/29/2021by text page via the [facility] messaging system. ASPECTS [facility] Stroke Program Early CT Score) - Ganglionic level infarction (caudate, lentiform nuclei, internal capsule, insula, M1-M3 cortex): 7 - Supraganglionic infarction (M4-M6 cortex): 3 Total score (0-10 with 10 being normal): 10 IMPRESSION: 1. No acute finding. 2. Severe temporal lobe atrophy. 3. Chronic small vessel ischemia. Electronically Signed   By: Monte Fantasia M.D.   On: 08/10/2019 09:35    Subjective: Patient seen and examined.  No overnight events.  Daughter at the bedside.  Patient himself is pleasantly confused and does not offer any problems.   Discharge Exam: Vitals:   08/16/19 1030 08/16/19 1130  BP: 130/79 120/67  Pulse: 96 78  Resp: 20 20  Temp: 98.5 F (36.9 C) 97.9 F (36.6 C)   SpO2: 95% 96%   Vitals:   08/16/19 0440 08/16/19 0858 08/16/19 1030 08/16/19 1130  BP: 113/78 123/80 130/79 120/67  Pulse: 72 71 96 78  Resp: 18 20 20 20   Temp: 97.8 F (36.6 C) 98.5 F (36.9 C) 98.5 F (36.9 C) 97.9 F (36.6 C)  TempSrc: Oral Oral  Oral  SpO2: 94% 95% 95% 96%  Weight:      Height:        General: Pt is alert, awake, not in acute distress.  Alert and awake.  Pleasantly confused. Cardiovascular: RRR, S1/S2 +, no rubs, no gallops Respiratory: CTA bilaterally, no wheezing, no rhonchi Abdominal: Soft, NT, ND, bowel sounds + Extremities: no edema, no cyanosis Neuro exam: Speech is clear.  He has cognitive dysfunction.  Tries to answer questions appropriately. He has generalized weakness.  Right upper extremity weaker than left upper extremity.    The results of significant diagnostics from this hospitalization (including imaging, microbiology, ancillary and laboratory) are listed below for reference.     Microbiology: Recent Results (from the past 240 hour(s))  Respiratory Panel by RT PCR (Flu A&B, Covid) - Nasopharyngeal Swab     Status: None   Collection Time: 08/10/19  9:55 AM   Specimen: Nasopharyngeal Swab  Result Value Ref Range Status   SARS Coronavirus 2 by RT PCR NEGATIVE NEGATIVE Final    Comment: (  NOTE) SARS-CoV-2 target nucleic acids are NOT DETECTED. The SARS-CoV-2 RNA is generally detectable in upper respiratoy specimens during the acute phase of infection. The lowest concentration of SARS-CoV-2 viral copies this assay can detect is 131 copies/mL. A negative result does not preclude SARS-Cov-2 infection and should not be used as the sole basis for treatment or other patient management decisions. A negative result may occur with  improper specimen collection/handling, submission of specimen other than nasopharyngeal swab, presence of viral mutation(s) within the areas targeted by this assay, and inadequate number of viral copies (<131  copies/mL). A negative result must be combined with clinical observations, patient history, and epidemiological information. The expected result is Negative. Fact Sheet for Patients:  PinkCheek.be Fact Sheet for Healthcare Providers:  GravelBags.it This test is not yet ap proved or cleared by the [place] FDA and  has been authorized for detection and/or diagnosis of SARS-CoV-2 by FDA under an Emergency Use Authorization (EUA). This EUA will remain  in effect (meaning this test can be used) for the duration of the COVID-19 declaration under Section 564(b)(1) of the Act, 21 U.S.C. section 360bbb-3(b)(1), unless the authorization is terminated or revoked sooner.    Influenza A by PCR NEGATIVE NEGATIVE Final   Influenza B by PCR NEGATIVE NEGATIVE Final    Comment: (NOTE) The Xpert Xpress SARS-CoV-2/FLU/RSV assay is intended as an aid in  the diagnosis of influenza from Nasopharyngeal swab specimens and  should not be used as a sole basis for treatment. Nasal washings and  aspirates are unacceptable for Xpert Xpress SARS-CoV-2/FLU/RSV  testing. Fact Sheet for Patients: PinkCheek.be Fact Sheet for Healthcare Providers: GravelBags.it This test is not yet approved or cleared by the [place] FDA and  has been authorized for detection and/or diagnosis of SARS-CoV-2 by  FDA under an Emergency Use Authorization (EUA). This EUA will remain  in effect (meaning this test can be used) for the duration of the  Covid-19 declaration under Section 564(b)(1) of the Act, 21  U.S.C. section 360bbb-3(b)(1), unless the authorization is  terminated or revoked. Performed at [facility], [place] [address], [place], [place] [address]      Labs: BNP (last 3 results) No results for input(s): BNP in the last 8760 hours. Basic Metabolic Panel: Recent Labs  Lab 08/10/19 0926  08/10/19 0927 08/16/19 0531  NA 136 137 133*  K 4.3 4.5 4.1  CL 105 104 103  CO2 20*  --  22  GLUCOSE 139* 133* 123*  BUN 12 15 18   CREATININE 0.96 0.70 0.81  CALCIUM 9.4  --  9.1   Liver Function Tests: Recent Labs  Lab 08/10/19 0926  AST 24  ALT 19  ALKPHOS 51  BILITOT 0.8  PROT 7.4  ALBUMIN 4.1   No results for input(s): LIPASE, AMYLASE in the last 168 hours. No results for input(s): AMMONIA in the last 168 hours. CBC: Recent Labs  Lab 08/10/19 0926 08/10/19 0927 08/16/19 0531  WBC 10.9*  --  8.6  NEUTROABS 9.2*  --   --   HGB 14.8 15.3 14.6  HCT 45.4 45.0 43.4  MCV 91.2  --  91.0  PLT 197  --  212   Cardiac Enzymes: No results for input(s): CKTOTAL, CKMB, CKMBINDEX, TROPONINI in the last 168 hours. BNP: Invalid input(s): POCBNP CBG: Recent Labs  Lab 08/15/19 1138 08/15/19 1559 08/15/19 2133 08/16/19 0606 08/16/19 1132  GLUCAP 120* 125* 137* 112* 181*   D-Dimer No results for input(s): DDIMER  in the last 72 hours. Hgb A1c No results for input(s): HGBA1C in the last 72 hours. Lipid Profile No results for input(s): CHOL, HDL, LDLCALC, TRIG, CHOLHDL, LDLDIRECT in the last 72 hours. Thyroid function studies No results for input(s): TSH, T4TOTAL, T3FREE, THYROIDAB in the last 72 hours.  Invalid input(s): FREET3 Anemia work up No results for input(s): VITAMINB12, FOLATE, FERRITIN, TIBC, IRON, RETICCTPCT in the last 72 hours. Urinalysis    Component Value Date/Time   COLORURINE YELLOW 06/06/2018 1655   APPEARANCEUR CLEAR 06/06/2018 1655   LABSPEC 1.008 06/06/2018 1655   PHURINE 6.0 06/06/2018 1655   GLUCOSEU >=500 (A) 06/06/2018 1655   HGBUR NEGATIVE 06/06/2018 1655   BILIRUBINUR NEGATIVE 06/06/2018 1655   KETONESUR NEGATIVE 06/06/2018 1655   PROTEINUR NEGATIVE 06/06/2018 1655   UROBILINOGEN 1.0 12/24/2014 1356   NITRITE NEGATIVE 06/06/2018 1655   LEUKOCYTESUR NEGATIVE 06/06/2018 1655   Sepsis Labs Invalid input(s): PROCALCITONIN,  WBC,   LACTICIDVEN Microbiology Recent Results (from the past 240 hour(s))  Respiratory Panel by RT PCR (Flu A&B, Covid) - Nasopharyngeal Swab     Status: None   Collection Time: 08/10/19  9:55 AM   Specimen: Nasopharyngeal Swab  Result Value Ref Range Status   SARS Coronavirus 2 by RT PCR NEGATIVE NEGATIVE Final    Comment: (NOTE) SARS-CoV-2 target nucleic acids are NOT DETECTED. The SARS-CoV-2 RNA is generally detectable in upper respiratoy specimens during the acute phase of infection. The lowest concentration of SARS-CoV-2 viral copies this assay can detect is 131 copies/mL. A negative result does not preclude SARS-Cov-2 infection and should not be used as the sole basis for treatment or other patient management decisions. A negative result may occur with  improper specimen collection/handling, submission of specimen other than nasopharyngeal swab, presence of viral mutation(s) within the areas targeted by this assay, and inadequate number of viral copies (<131 copies/mL). A negative result must be combined with clinical observations, patient history, and epidemiological information. The expected result is Negative. Fact Sheet for Patients:  PinkCheek.be Fact Sheet for Healthcare Providers:  GravelBags.it This test is not yet ap proved or cleared by the [place] FDA and  has been authorized for detection and/or diagnosis of SARS-CoV-2 by FDA under an Emergency Use Authorization (EUA). This EUA will remain  in effect (meaning this test can be used) for the duration of the COVID-19 declaration under Section 564(b)(1) of the Act, 21 U.S.C. section 360bbb-3(b)(1), unless the authorization is terminated or revoked sooner.    Influenza A by PCR NEGATIVE NEGATIVE Final   Influenza B by PCR NEGATIVE NEGATIVE Final    Comment: (NOTE) The Xpert Xpress SARS-CoV-2/FLU/RSV assay is intended as an aid in  the diagnosis of influenza  from Nasopharyngeal swab specimens and  should not be used as a sole basis for treatment. Nasal washings and  aspirates are unacceptable for Xpert Xpress SARS-CoV-2/FLU/RSV  testing. Fact Sheet for Patients: PinkCheek.be Fact Sheet for Healthcare Providers: GravelBags.it This test is not yet approved or cleared by the [place] FDA and  has been authorized for detection and/or diagnosis of SARS-CoV-2 by  FDA under an Emergency Use Authorization (EUA). This EUA will remain  in effect (meaning this test can be used) for the duration of the  Covid-19 declaration under Section 564(b)(1) of the Act, 21  U.S.C. section 360bbb-3(b)(1), unless the authorization is  terminated or revoked. Performed at [facility], [place] [address] [place], [place] [address]      Time coordinating discharge:  35 minutes  SIGNED:   Barb Merino, MD  [facility] 08/16/2019, 1:55 PM

## 2019-08-16 NOTE — Progress Notes (Signed)
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 MRI of the brain revealed left ACA territory stroke   OT comments  Pt received in bed with daughter and granddaughter present throughout the session. Pt required maxA+2 for bed mobility and maxA+2 for sit<>stand and stand-pivot transfer to recliner. While sitting EOB, pt required consistent cues for postural correction and required BUE support for stability with therapist blocking R elbow from flexing to support upright sitting balance. Pt with plan to d/c to CIR this date. Pt will continue to benefit from skilled OT services to maximize safety and independence with ADL/IADL and functional mobility.   Follow Up Recommendations  CIR    Equipment Recommendations  Other (comment)(defer to next venue of care)    Recommendations for Other Services PT consult    Precautions / Restrictions Precautions Precautions: Fall Precaution Comments: heavy posterior lean sitting EOB Required Braces or Orthoses: (hand mitts) Restrictions Weight Bearing Restrictions: No       Mobility Bed Mobility Overal bed mobility: Needs Assistance Bed Mobility: Sit to Supine       Sit to supine: Max assist;+2 for physical assistance;+2 for safety/equipment   General bed mobility comments: pt following commands to progress BLE toward EOB, required maxA to progress trunk to upright posture and maintain posture  Transfers Overall transfer level: Needs assistance Equipment used: 2 person hand held assist Transfers: Sit to/from [facility] Sit to Stand: Max assist;+2 physical assistance;+2 safety/equipment Stand pivot transfers: Max assist;+2 physical assistance;+2  safety/equipment       General transfer comment: maxA+2 with pt pulling up on recliner, maxA for stability in standing and assist to block R knee;while in standing maxA+2 to shift weight R<>L;sit<>stand x3;pt required maxA for stand pivot towards left to recliner    Balance Overall balance assessment: Needs assistance Sitting-balance support: Bilateral upper extremity supported;Feet supported Sitting balance-Leahy Scale: Poor Sitting balance - Comments: mod-max A to maintain sitting balance EOB due to R lateral bias Postural control: Posterior lean;Right lateral lean Standing balance support: Bilateral upper extremity supported Standing balance-Leahy Scale: Poor Standing balance comment: reliant on BUE support and external assist                           ADL either performed or assessed with clinical judgement   ADL Overall ADL's : Needs assistance/impaired             Lower Body Bathing: Maximal assistance;+2 for physical assistance;Bed level   Upper Body Dressing : Moderate assistance;Bed level   Lower Body Dressing: Maximal assistance;+2 for physical assistance;Bed level   Toilet Transfer: Maximal assistance;+2 for physical assistance;+2 for safety/equipment;Stand-pivot Toilet Transfer Details (indicate cue type and reason): simulated to recliner         Functional mobility during ADLs: Maximal assistance;+2 for physical assistance General ADL Comments: maxA for balance sitting EOB, required BUE support for stability and continued to demonstrate posterior lean     Vision       Perception     Praxis      Cognition Arousal/Alertness: Awake/alert Behavior During Therapy: Flat affect Overall Cognitive Status: Impaired/Different from baseline Area of Impairment: Problem solving;Awareness;Safety/judgement;Attention;Following commands  Rancho Levels of Cognitive Functioning Rancho Los Amigos Scales of Cognitive Functioning:  Automatic/appropriate   Current Attention Level: Sustained Memory: Decreased short-term memory Following Commands: Follows one step commands inconsistently;Follows one step commands with increased time Safety/Judgement: Decreased awareness of safety;Decreased awareness of deficits Awareness: Intellectual Problem Solving: Requires verbal cues;Requires tactile cues;Difficulty sequencing;Decreased initiation;Slow processing General Comments: requires consistent cues for sequencing;pt slow to respond, flat affect throughout session;        Exercises     Shoulder Instructions       General Comments pt's daughter and granddaughter present during session    Pertinent Vitals/ Pain       Pain Assessment: No/denies pain Faces Pain Scale: No hurt Pain Intervention(s): Limited activity within patient's tolerance;Monitored during session  Home Living                                          Prior Functioning/Environment              Frequency  Min 2X/week        Progress Toward Goals  OT Goals(current goals can now be found in the care plan section)  Progress towards OT goals: Progressing toward goals  Acute Rehab OT Goals Patient Stated Goal: CIR today OT Goal Formulation: With patient/family Time For Goal Achievement: 08/25/19 Potential to Achieve Goals: Good ADL Goals Pt Will Perform Grooming: sitting;with min assist Pt Will Perform Upper Body Bathing: sitting;with min assist Pt Will Perform Lower Body Bathing: with max assist;sit to/from stand Pt Will Transfer to Toilet: with max assist;stand pivot transfer;bedside commode Additional ADL Goal #1: Pt will sit EOB at min A level for 5 minutes to facilitate UB ADLs.  Plan Discharge plan remains appropriate    Co-evaluation    PT/OT/SLP Co-Evaluation/Treatment: Yes Reason for Co-Treatment: Complexity of the patient's impairments (multi-system involvement);For patient/therapist safety;To address  functional/ADL transfers   OT goals addressed during session: ADL's and self-care      AM-PAC OT "6 Clicks" Daily Activity     Outcome Measure   Help from another person eating meals?: A Little Help from another person taking care of personal grooming?: A Lot Help from another person toileting, which includes using toliet, bedpan, or urinal?: Total Help from another person bathing (including washing, rinsing, drying)?: Total Help from another person to put on and taking off regular upper body clothing?: A Lot Help from another person to put on and taking off regular lower body clothing?: Total 6 Click Score: 10    End of Session Equipment Utilized During Treatment: Gait belt  OT Visit Diagnosis: Unsteadiness on feet (R26.81);Muscle weakness (generalized) (M62.81);Other symptoms and signs involving cognitive function;Other symptoms and signs involving the nervous system (R29.898);Cognitive communication deficit (R41.841);Hemiplegia and hemiparesis Symptoms and signs involving cognitive functions: Cerebral infarction Hemiplegia - Right/Left: Right Hemiplegia - dominant/non-dominant: Dominant Hemiplegia - caused by: Cerebral infarction   Activity Tolerance Patient tolerated treatment well   Patient Left with call bell/phone within reach;with family/visitor present;in chair;with chair alarm set;with nursing/sitter in room   Nurse Communication Other (comment)(NT assisted during part of session)        Time: 7169-6789 OT Time Calculation (min): 24 min  Charges: OT General Charges $OT Visit: 1 Visit OT Treatments $Self Care/Home Management : 8-22 mins  Rosey Bath OTR/L Acute Rehabilitation Services Office: [phone]    Rebeca Alert 08/16/2019, 4:30 PM

## 2019-08-17 ENCOUNTER — Inpatient Hospital Stay (HOSPITAL_COMMUNITY): Payer: Medicare Other | Admitting: Physical Therapy

## 2019-08-17 ENCOUNTER — Inpatient Hospital Stay (HOSPITAL_COMMUNITY): Payer: Medicare Other | Admitting: Speech Pathology

## 2019-08-17 ENCOUNTER — Inpatient Hospital Stay (HOSPITAL_COMMUNITY): Payer: Medicare Other | Admitting: Occupational Therapy

## 2019-08-17 DIAGNOSIS — I63322 Cerebral infarction due to thrombosis of left anterior cerebral artery: Secondary | ICD-10-CM | POA: Diagnosis not present

## 2019-08-17 LAB — COMPREHENSIVE METABOLIC PANEL
ALT: 25 U/L (ref 0–44)
AST: 29 U/L (ref 15–41)
Albumin: 3.4 g/dL — ABNORMAL LOW (ref 3.5–5.0)
Alkaline Phosphatase: 62 U/L (ref 38–126)
Anion gap: 8 (ref 5–15)
BUN: 17 mg/dL (ref 8–23)
CO2: 24 mmol/L (ref 22–32)
Calcium: 9.2 mg/dL (ref 8.9–10.3)
Chloride: 104 mmol/L (ref 98–111)
Creatinine, Ser: 0.89 mg/dL (ref 0.61–1.24)
GFR calc Af Amer: >60 mL/min (ref >60)
GFR calc non Af Amer: >60 mL/min (ref >60)
Glucose, Bld: 126 mg/dL — ABNORMAL HIGH (ref 70–99)
Potassium: 4.3 mmol/L (ref 3.5–5.1)
Sodium: 136 mmol/L (ref 135–145)
Total Bilirubin: 0.7 mg/dL (ref 0.3–1.2)
Total Protein: 6.8 g/dL (ref 6.5–8.1)

## 2019-08-17 LAB — CBC WITH DIFFERENTIAL/PLATELET
Abs Immature Granulocytes: 0.06 10*3/uL (ref 0.00–0.07)
Basophils Absolute: 0.1 10*3/uL (ref 0.0–0.1)
Basophils Relative: 1 %
Eosinophils Absolute: 0.6 10*3/uL — ABNORMAL HIGH (ref 0.0–0.5)
Eosinophils Relative: 6 %
HCT: 43.4 % (ref 39.0–52.0)
Hemoglobin: 14.4 g/dL (ref 13.0–17.0)
Immature Granulocytes: 1 %
Lymphocytes Relative: 15 %
Lymphs Abs: 1.5 10*3/uL (ref 0.7–4.0)
MCH: 30 pg (ref 26.0–34.0)
MCHC: 33.2 g/dL (ref 30.0–36.0)
MCV: 90.4 fL (ref 80.0–100.0)
Monocytes Absolute: 1 10*3/uL (ref 0.1–1.0)
Monocytes Relative: 10 %
Neutro Abs: 6.7 10*3/uL (ref 1.7–7.7)
Neutrophils Relative %: 67 %
Platelets: 222 10*3/uL (ref 150–400)
RBC: 4.8 MIL/uL (ref 4.22–5.81)
RDW: 13.5 % (ref 11.5–15.5)
WBC: 9.9 10*3/uL (ref 4.0–10.5)
nRBC: 0 % (ref 0.0–0.2)

## 2019-08-17 LAB — GLUCOSE, CAPILLARY
Glucose-Capillary: 123 mg/dL — ABNORMAL HIGH (ref 70–99)
Glucose-Capillary: 121 mg/dL — ABNORMAL HIGH (ref 70–99)
Glucose-Capillary: 116 mg/dL — ABNORMAL HIGH (ref 70–99)
Glucose-Capillary: 132 mg/dL — ABNORMAL HIGH (ref 70–99)

## 2019-08-17 NOTE — Progress Notes (Signed)
Patient sleep well all night. Beginning of the shift patient is a little restless, pulled off condom catheter and sliding self down to the bottom of the bed. Around 10 PM last night patient is resting, nursing monitor patient for safety check and incontinent needs.

## 2019-08-17 NOTE — Progress Notes (Signed)
[facility] PHYSICAL MEDICINE & REHABILITATION PROGRESS NOTE   Subjective/Complaints: Patient without complaints today he is in bed resting quietly.  Answers questions appropriately  Review of systems negative for chest pain, shortness of breath, nausea vomiting diarrhea or constipation   Objective:   MR BRAIN WO CONTRAST  Result Date: 08/15/2019 CLINICAL DATA:  Stroke with worsening symptoms.  Recent stroke. EXAM: MRI HEAD WITHOUT CONTRAST TECHNIQUE: Multiplanar, multiecho pulse sequences of the brain and surrounding structures were obtained without intravenous contrast. COMPARISON:  MRI 08/10/2019. FINDINGS: Brain: Interval extension of left anterior cerebral artery territory infarct. Large area of infarct now in the body of the corpus callosum on the left. There is also infarct in the left posterior frontal medial lobe compatible with distal left MCA infarct. Note is made of severe stenosis left A3 segment on CTA. Generalized atrophy which is severe in the temporal lobes. Ventricular enlargement consistent with volume loss in the brain. Negative for hemorrhage or mass lesion. Vascular: Normal arterial flow voids Skull and upper cervical spine: No focal skeletal lesion. Sinuses/Orbits: Paranasal sinuses clear.  No orbital lesion Other: None IMPRESSION: Left anterior cerebral artery territory infarct has progressed since the recent MRI of 08/10/2019. No associated hemorrhage Generalized atrophy which is severe in the temporal lobes. Chronic microvascular ischemic change in the white matter. Electronically Signed   By: Marlan Palau M.D.   On: 08/15/2019 18:37   Recent Labs    08/16/19 0531 08/17/19 0522  WBC 8.6 9.9  HGB 14.6 14.4  HCT 43.4 43.4  PLT 212 222   Recent Labs    08/16/19 0531 08/17/19 0522  NA 133* 136  K 4.1 4.3  CL 103 104  CO2 22 24  GLUCOSE 123* 126*  BUN 18 17  CREATININE 0.81 0.89  CALCIUM 9.1 9.2    Intake/Output Summary (Last 24 hours) at 08/17/2019  0854 Last data filed at 08/17/2019 0747 Gross per 24 hour  Intake 240 ml  Output 300 ml  Net -60 ml     Physical Exam: Vital Signs Blood pressure (!) 156/71, pulse 71, temperature 98 F (36.7 C), resp. rate 16, height 5\' 9"  (1.753 m), weight 93.6 kg, SpO2 98 %.   General: No acute distress Mood and affect are appropriate Heart: Regular rate and rhythm no rubs murmurs or extra sounds Lungs: Clear to auscultation, breathing unlabored, no rales or wheezes Abdomen: Positive bowel sounds, soft nontender to palpation, nondistended Extremities: No clubbing, cyanosis, or edema Skin: No evidence of breakdown, no evidence of rash Neurologic: Cranial nerves II through XII intact, motor strength is 5/5 in left and 4/5 right deltoid, bicep, tricep, grip, hip flexor, knee extensors, ankle dorsiflexor and plantar flexor Sensory exam normal sensation to light touch and proprioception in bilateral upper and lower extremities Cerebellar exam normal finger to nose to finger as well as heel to shin in bilateral upper and lower extremities Musculoskeletal: Full range of motion in all 4 extremities. No joint swelling   Assessment/Plan: 1. Functional deficits secondary to left ACA infarct which require 3+ hours per day of interdisciplinary therapy in a comprehensive inpatient rehab setting.  Physiatrist is providing close team supervision and 24 hour management of active medical problems listed below.  Physiatrist and rehab team continue to assess barriers to discharge/monitor patient progress toward functional and medical goals  Care Tool:  Bathing  Bathing activity did not occur: Safety/medical concerns           Bathing assist  Upper Body Dressing/Undressing Upper body dressing   What is the patient wearing?: Hospital gown only    Upper body assist Assist Level: Minimal Assistance - Patient > 75%    Lower Body Dressing/Undressing Lower body dressing      What is the patient  wearing?: Hospital gown only     Lower body assist Assist for lower body dressing: Minimal Assistance - Patient > 75%     Toileting Toileting    Toileting assist       Transfers Chair/bed transfer  Transfers assist  Chair/bed transfer activity did not occur: Safety/medical concerns  Chair/bed transfer assist level: 2 Helpers     Locomotion Ambulation   Ambulation assist              Walk 10 feet activity   Assist           Walk 50 feet activity   Assist           Walk 150 feet activity   Assist           Walk 10 feet on uneven surface  activity   Assist           Wheelchair     Assist               Wheelchair 50 feet with 2 turns activity    Assist            Wheelchair 150 feet activity     Assist          Blood pressure (!) 156/71, pulse 71, temperature 98 F (36.7 C), resp. rate 16, height 5\' 9"  (1.753 m), weight 93.6 kg, SpO2 98 %.  Medical Problem List and Plan: 1.  Right side weakness with dysarthria secondary to left ACA territory acute infarction as well as history of CVA 2020 with cognitive deficits             -patient may shower             -ELOS/Goals: 16-19 days/min/Mod a            CIR eval today 2.  Antithrombotics: -DVT/anticoagulation: Eliquis             -antiplatelet therapy: N/A 3. Pain Management: Tylenol as needed 4. Mood: Melatonin 3 mg nightly             -antipsychotic agents: N/A 5. Neuropsych: This patient is capable of making decisions on his own behalf. 6. Skin/Wound Care: Routine skin checks 7. Fluids/Electrolytes/Nutrition: Routine in and outs. CMP ordered. 8.  History of saddle pulmonary emboli.  Continue Eliquis 9.  Diabetes mellitus.  Hemoglobin A1c 6.1.  SSI.  Patient on Glucophage 1000 mg twice daily prior to admission.  Resume as needed CBG (last 3)  Recent Labs    08/16/19 2055 08/17/19 0636 08/17/19 1123  GLUCAP 144* 123* 121*               Monitor  with increased mobility 10.  Hypertension.  Avapro 75 mg daily.               Monitor with increased mobility 11.  Hypothyroidism. Continue Synthroid 12.  Hyperlipidemia. Lipitor 13.  History of tobacco abuse.  Counseling    LOS: 1 days A FACE TO [place] 08/17/2019, 8:54 AM

## 2019-08-17 NOTE — Care Management (Signed)
[facility]patient [facility] Individual Statement of Services  Patient Name:  Roger Ramirez  Date:  08/17/2019  Welcome to the Inpatient Rehabilitation Center.  Our goal is to provide you with an individualized program based on your diagnosis and situation, designed to meet your specific needs.  With this comprehensive rehabilitation program, you will be expected to participate in at least 3 hours of rehabilitation therapies Monday-Friday, with modified therapy programming on the weekends.  Your rehabilitation program will include the following services:  Physical Therapy (PT), Occupational Therapy (OT), Speech Therapy (ST), 24 hour per day rehabilitation nursing, Therapeutic Recreaction (TR), Neuropsychology, Case Management (Social Worker), Rehabilitation Medicine, Nutrition Services and Pharmacy Services  Weekly team conferences will be held on Wednesdays to discuss your progress.  Your Social Worker will talk with you frequently to get your input and to update you on team discussions.  Team conferences with you and your family in attendance may also be held.  Expected length of stay: 2-3 Weeks  Overall anticipated outcome: MOD A to Supevision  Depending on your progress and recovery, your program may change. Your Social Worker will coordinate services and will keep you informed of any changes. Your Social Worker's name and contact numbers are listed  below.  The following services may also be recommended but are not provided by the Inpatient Rehabilitation Center:    Home Health Rehabiltiation Services  Outpatient Rehabilitation Services   Arrangements will be made to provide these services after discharge if needed.  Arrangements include referral to agencies that provide these services.  Your insurance has been verified to be:  [employer] Your primary doctor is:  Renford Dills, MD  Pertinent information will be shared with your doctor and your insurance company.  Social  Worker:  Lavera Guise, [place] [phone] or ([phone]   Information discussed with and copy given to patient by: [name], 08/17/2019, 2:09 PM

## 2019-08-17 NOTE — Progress Notes (Signed)
Occupational Therapy Session Note  Patient Details  Name: Roger Ramirez MRN: [id number] Date of Birth: 07-Aug-1945  Today's Date: 08/17/2019 OT Individual Time: 1500-1529 OT Individual Time Calculation (min): 29 min    Short Term Goals: Week 1:  OT Short Term Goal 1 (Week 1): Pt will be able to sit to EOB with S. OT Short Term Goal 2 (Week 1): Pt will be able to sit unsupported with mod A at EOB. OT Short Term Goal 3 (Week 1): Pt will be able to rise to stand in stedy with max A of 1. OT Short Term Goal 4 (Week 1): Pt will bathe UB with set up from wc level.  Skilled Therapeutic Interventions/Progress Updates:    Pt in bed to start session.  He was oriented to month but not day of the week or to place.  He was also not aware of why he was in the hospital.  He transferred from supine to sitting with max assist on the right side of the bed.  Total assist needed for initial sitting balance secondary to right and posterior lean/LOB.  Once on the EOB he worked on donning his pants.  Total assist was needed to maintain dynamic sitting balance while therapist had to donn pants over the right leg.  He was able to complete donning over the left leg with min assist while supported.  Total assist was then needed for sit to stand to pull them up over his hips with pt demonstrating a flexed posture and right lean in standing.  The [place] was then used to complete simulated toilet transfer and sit to stand from the bed.  He was able to complete standing with max assist inside of the [place] with mod demonstrational cueing for hand placement and for standing up tall, as he maintained flexed trunk and back.  He declined the need to toilet so he was transferred back to the EOB via the [place].  He then completed transfer to supine with max assist to complete session.  He was unable to identify the call button to be pressed if nursing was needed when asked.  Therapist demonstrated use of this as well.  He was left in bed  with safety alarm in place.    Therapy Documentation Precautions:  Precautions Precautions: Fall Precaution Comments: heavy posterior and right lean sitting EOB Restrictions Weight Bearing Restrictions: No  Pain: Pain Assessment Pain Scale: Faces Pain Score: 0-No pain ADL: See Care Tool Section for some details of mobility and selfcare Therapy/Group: Individual Therapy  Takia Runyon  OTR/L 08/17/2019, 4:23 PM

## 2019-08-17 NOTE — Evaluation (Signed)
Physical Therapy Assessment and Plan  Patient Details  Name: Roger Ramirez MRN: [id number] Date of Birth: Apr 12, 1946  PT Diagnosis: Abnormal posture, Abnormality of gait, Cognitive deficits, Difficulty walking, Hemiparesis dominant, Hypertonia, Impaired cognition, Impaired sensation and Muscle weakness Rehab Potential: Fair ELOS: ~3 weeks   Today's Date: 08/17/2019 PT Individual Time: 1302-1400 PT Individual Time Calculation (min): 58 min    Problem List:  Patient Active Problem List   Diagnosis Date Noted  . Resolved ischemic cerebrovascular accident (CVA) involving left anterior cerebral artery territory in remote past 08/16/2019  . Benign essential HTN   . Acute ischemic stroke ([place])   . History of CVA (cerebrovascular accident)   . History of pulmonary embolism   . Tobacco abuse   . Essential hypertension   . Obesity (BMI 30.0-34.9) 08/10/2019  . Cerebral infarction ([place])   . CVA (cerebral vascular accident) ([place]) 06/06/2018  . Antiphospholipid syndrome ([place]) 06/06/2018  . Diabetes mellitus type 2, noninsulin dependent ([place]) 06/06/2018  . Hypercholesterolemia with hypertriglyceridemia 01/23/2016  . Impaired fasting glucose 01/23/2016  . B12 deficiency 01/23/2016  . Hypothyroid 01/23/2016  . Cognitive dysfunction 01/23/2016  . Pleuritic chest pain   . Leg DVT (deep venous thromboembolism), acute ([place])   . Near syncope   . Elevated BP   . History of pulmonary embolus (PE)   . Pulmonary embolism ([place]) 12/24/2014    Past Medical History:  Past Medical History:  Diagnosis Date  . Antiphospholipid syndrome ([place])   . CVA (cerebral vascular accident) ([place]) 06/07/2019  . Diabetes mellitus without complication ([place])   . Hypertension   . Hypothyroidism   . Pulmonary embolism [facility])    Past Surgical History: History reviewed. No pertinent surgical history.  Assessment & Plan Clinical Impression: Patient is a 74 y.o. year old right-handed male with history of saddle  pulmonary emboli from presumed antiphospholipid antibody syndrome maintained on Xarelto,  tobacco abuse, hypertension, diabetes mellitus, CVA 2020 with reported cognitive deficits. History taken from chart review and daughter due to processing. Patient lives alone independent prior to admission.  He has a son and daughter in the area who check on him as needed and prepare meals.  1 level home 6 steps to entry. He presented on 4/59/21 with right hemiparesis, dysarthria, and AMS. Cranial CT scan unremarkable for acute intracranial process. MRI showed acute infarction affecting the left body of the corpus callosum.  CT angiogram head and neck with atherosclerotic irregularity of the left A3 ACA with high-grade stenosis with suspected subsequent occlusion. Echocardiogram with ejection fraction of 65%. Patient did not receive TPA.  Follow-up MRI completed 08/15/2019 showed progression on left anterior cerebral artery infarction  since the MRI of 08/10/2019 no associated hemorrhage.  Neurology consulted with follow-up and currently on Eliquis for CVA prophylaxis.  Tolerating a regular diet.  Therapy evaluations completed and patient was admitted for a comprehensive rehab program. Patient transferred to CIR on 08/16/2019 .   Patient currently requires +2 total/max assist with mobility secondary to muscle weakness and muscle joint tightness, decreased cardiorespiratoy endurance, impaired timing and sequencing, abnormal tone, unbalanced muscle activation, motor apraxia and decreased motor planning, decreased attention to right, decreased initiation, decreased attention, decreased awareness, decreased problem solving, decreased safety awareness, decreased memory and delayed processing and decreased sitting balance, decreased standing balance, decreased postural control and decreased balance strategies.  Prior to hospitalization, patient was modified independent  with mobility and lived with Alone in a House home.  Home access is  5Stairs to enter.  Patient will benefit from skilled PT intervention to maximize safe functional mobility, minimize fall risk and decrease caregiver burden for planned discharge home with 24 hour assist.  Anticipate patient will benefit from follow up [facility] at discharge.  PT - End of Session Activity Tolerance: Tolerates 30+ min activity with multiple rests Endurance Deficit: Yes Endurance Deficit Description: requires frequent supported seated rest breaks PT Assessment Rehab Potential (ACUTE/IP ONLY): Fair PT Barriers to Discharge: [place] home environment;Home environment access/layout;Decreased caregiver support;Lack of/limited family support PT Patient demonstrates impairments in the following area(s): Balance;Perception;Behavior;Safety;Edema;Sensory;Endurance;Skin Integrity;Motor;Nutrition;Pain PT Transfers Functional Problem(s): Bed Mobility;Bed to Chair;Car;Furniture PT Locomotion Functional Problem(s): Ambulation;Wheelchair Mobility;Stairs PT Plan PT Intensity: Minimum of 1-2 x/day ,45 to 90 minutes PT Frequency: 5 out of 7 days PT Duration Estimated Length of Stay: ~3 weeks PT Treatment/Interventions: Ambulation/gait training;Community reintegration;DME/adaptive equipment instruction;Neuromuscular re-education;Psychosocial support;Stair training;UE/LE Strength taining/ROM;Wheelchair propulsion/positioning;Balance/vestibular training;Discharge planning;Functional electrical stimulation;Pain management;Skin care/wound management;Therapeutic Activities;UE/LE Coordination activities;Cognitive remediation/compensation;Disease management/prevention;Functional mobility training;Patient/family education;Splinting/orthotics;Therapeutic Exercise;Visual/perceptual remediation/compensation PT Transfers Anticipated Outcome(s): CGA PT Locomotion Anticipated Outcome(s): min assist using LRAD PT Recommendation Follow Up Recommendations: Home health PT;24 hour supervision/assistance Patient  destination: Home Equipment Recommended: To be determined  Skilled Therapeutic Intervention Evaluation completed (see details above and below) with education on PT POC and goals and individual treatment initiated with focus on bed mobility, transfers, activity tolerance, standing tolerance, and education regarding daily therapy schedule, weekly team meetings, purpose of PT evaluation, and other CIR information. Pt received sitting in w/c and agreeable to therapy session. R squat pivot transfer w/c>EOB with +2 max assist for lifting/pivoting hips and trunk control - max multimodal cuing for sequencing with pt demonstrating delayed initiation and poor motor planning. Sit>supine with max assist for trunk descent and B LE management into the bed. Supine>sit with max assist for B LE management off bed and trunk upright - demonstrates poor trunk control with R posterior lean once sitting EOB; requires max/total assist to scoot hips to edge. Sit<>stands x2 elevated EOB<>RW with +2 max assist for lifting and guarding/manually facilitating R knee extension - pt unable to come to full stand due to forward trunk flexion, sustained R hip/knee flexion, and R lateral lean despite max multimodal cuing and manual facilitation for improvement - tolerated partial standing position ~71mnute each trial. R squat pivot transfer to w/c with +2 max assist for lifting/pivoting hips. Transported to/from gym in w/c for time management and energy conservation. Sit<>stand in // bars with max assist of 1 for lifting into stand and +2 steadying w/c - continues to demonstrate above impairments once in standing but slightly improved with mirror feedback and use of // bars as opposed to RW. Transported back to room and performed squat pivot and sit>supine back to bed as described above. Pt left supine in bed with needs in reach and bed alarm on.  PT Evaluation Precautions/Restrictions Precautions Precautions: Fall Precaution Comments:  heavy posterior lean sitting EOB Restrictions Weight Bearing Restrictions: No Pain Pain Assessment Pain Scale: 0-10 Pain Score: 0-No pain Home Living/Prior Functioning Home Living Available Help at Discharge: Family;Available 24 hours/day(son, CJrue and daughter, MLenna [place]ara live close) Type of Home: House Home Access: Stairs to enter [employer] Steps: 5 Entrance Stairs-Rails: Right;Left(pt reports no handrails but per chart review has B HRs) Home Layout: One level Bathroom Shower/Tub: Tub/shower unit  Lives With: Alone Prior Function Level of Independence: Independent with homemaking with ambulation;Independent with gait;Independent with transfers;Requires assistive device for independence(pt reports using a RW)  Able to Take Stairs?: Yes Driving: No  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" Comments: per chart review had recovered gait to walk with no AD outside after first CVA (per pt he used a RW) Social research officer, government Perception: Impaired Inattention/Neglect: Does not attend to right visual field Praxis Praxis: Impaired Praxis Impairment Details: Initiation;Ideomotor;Motor planning;Ideation  Cognition Overall Cognitive Status: Impaired/Different from baseline Arousal/Alertness: Awake/alert Orientation Level: Oriented to person;Oriented to place;Disoriented to situation;Disoriented to time Attention: Focused;Sustained Focused Attention: Appears intact Sustained Attention: Appears intact Memory: Impaired Memory Impairment: Decreased recall of new information;Decreased short term memory Decreased Short Term Memory: Verbal basic;Functional basic Immediate Memory Recall: Sock;Blue;Bed Memory Recall Sock: Not able to recall Memory Recall Blue: With Cue Memory Recall Bed: Not able to recall Awareness: Impaired Awareness Impairment: Intellectual impairment Problem Solving: Impaired Problem Solving Impairment: Functional  basic;Verbal basic Executive Function: Decision Making;Initiating;Organizing Organizing: Impaired Organizing Impairment: Verbal basic;Functional basic Decision Making: Impaired Decision Making Impairment: Verbal basic Initiating: Impaired Initiating Impairment: Verbal basic;Functional basic Safety/Judgment: Impaired Comments: Pt with very poor awareness of any physical or cognitive impairments, he needs Total A to rationalize why he couldn't/shouldn't attempt to get out of bed without assistance while in the hospital Sensation Sensation Light Touch: Appears Intact(able to sense light touch on screen) Hot/Cold: Not tested Proprioception: Impaired by gross assessment Stereognosis: Not tested Additional Comments: poor proprioceptive awareness in RLE Coordination Gross Motor Movements are Fluid and Coordinated: No Fine Motor Movements are Fluid and Coordinated: No Coordination and Movement Description: impaired due to R hemiparesis, impaired trunk control, and impaired proprioception Finger Nose Finger Test: overshooting on BUE Motor  Motor Motor: Hemiplegia;Abnormal tone Motor - Skilled Clinical Observations: R hemiparesis  Mobility Bed Mobility Bed Mobility: Supine to Sit;Sit to Supine Supine to Sit: Maximal Assistance - Patient - Patient 25-49% Sit to Supine: Maximal Assistance - Patient 25-49% Transfers Transfers: Sit to Stand;Stand to Sit;Squat Pivot Transfers Sit to Stand: 2 Helpers Stand to Sit: 2 Helpers Squat Pivot Transfers: 2 Press photographer (Assistive device): Manufacturing systems engineer Ambulation: No Gait Gait: No Stairs / Additional Locomotion Stairs: No Wheelchair Mobility Wheelchair Mobility: No  Trunk/Postural Assessment  Cervical Assessment Cervical Assessment: Exceptions to WFL(forward head) Thoracic Assessment Thoracic Assessment: Exceptions to WFL(thoracic rounding) Lumbar Assessment Lumbar Assessment: Exceptions to WFL(posterior pelvic  tilt) Postural Control Postural Control: Deficits on evaluation Righting Reactions: absent Protective Responses: severely impaired Postural Limitations: pt with posterior and R lateral lean, he can hold static sit with B hand support with min assist for brief moments or requires back supported in wc  Balance Balance Balance Assessed: Yes Static Sitting Balance Static Sitting - Level of Assistance: 2: Max assist Dynamic Sitting Balance Dynamic Sitting - Level of Assistance: 2: Max assist;1: +1 Total assist Static Standing Balance Static Standing - Level of Assistance: 2: Max assist(+2 max assist) Dynamic Standing Balance Dynamic Standing - Balance Support: (unable to attempt dynamic standing tasks) Extremity Assessment      RLE Assessment RLE Assessment: Exceptions to [facility] Passive Range of Motion (PROM) Comments: Limited knee extension PROM due to tightness in hamstring; limited ankle DF due to tightness in gastroc/soleus Active Range of Motion (AROM) Comments: Limited knee extension ROM RLE Strength Right Hip Flexion: 2-/5 Right Knee Flexion: 1/5 Right Knee Extension: 2-/5 Right Ankle Dorsiflexion: 2-/5 Right Ankle Plantar Flexion: 2-/5 RLE Tone RLE Tone: Mild;Hypertonic LLE Assessment LLE Assessment: Exceptions to [facility] Active Range of Motion (AROM) Comments: WFL LLE Strength Left Hip Flexion: 4+/5 Left Knee Flexion: 4+/5 Left Knee Extension: 4+/5 Left  Ankle Dorsiflexion: 4+/5(demonstrates slight inversion preference) Left Ankle Plantar Flexion: 4+/5    Refer to Care Plan for Long Term Goals  Recommendations for other services: None   Discharge Criteria: Patient will be discharged from PT if patient refuses treatment 3 consecutive times without medical reason, if treatment goals not met, if there is a change in medical status, if patient makes no progress towards goals or if patient is discharged from hospital.  The above assessment, treatment plan, treatment  alternatives and goals were discussed and mutually agreed upon: by patient  Tawana Scale, PT, DPT 08/17/2019, 7:57 AM

## 2019-08-17 NOTE — Progress Notes (Signed)
Social Work Assessment and Plan   Patient Details  Name: Roger Ramirez MRN: [id number] Date of Birth: 24-Sep-1945  Today's Date: 08/17/2019  Problem List:  Patient Active Problem List   Diagnosis Date Noted  . Resolved ischemic cerebrovascular accident (CVA) involving left anterior cerebral artery territory in remote past 08/16/2019  . Benign essential HTN   . Acute ischemic stroke ([place])   . History of CVA (cerebrovascular accident)   . History of pulmonary embolism   . Tobacco abuse   . Essential hypertension   . Obesity (BMI 30.0-34.9) 08/10/2019  . Cerebral infarction ([place])   . CVA (cerebral vascular accident) ([place]) 06/06/2018  . Antiphospholipid syndrome ([place]) 06/06/2018  . Diabetes mellitus type 2, noninsulin dependent ([place]) 06/06/2018  . Hypercholesterolemia with hypertriglyceridemia 01/23/2016  . Impaired fasting glucose 01/23/2016  . B12 deficiency 01/23/2016  . Hypothyroid 01/23/2016  . Cognitive dysfunction 01/23/2016  . Pleuritic chest pain   . Leg DVT (deep venous thromboembolism), acute ([place])   . Near syncope   . Elevated BP   . History of pulmonary embolus (PE)   . Pulmonary embolism ([place]) 12/24/2014   Past Medical History:  Past Medical History:  Diagnosis Date  . Antiphospholipid syndrome ([place])   . CVA (cerebral vascular accident) ([place]) 06/07/2019  . Diabetes mellitus without complication ([place])   . Hypertension   . Hypothyroidism   . Pulmonary embolism [facility])    Past Surgical History: History reviewed. No pertinent surgical history. Social History:  reports that he has quit smoking. His smoking use included cigarettes. He has a 20.00 pack-year smoking history. He has never used smokeless tobacco. He reports previous alcohol use. He reports that he does not use drugs.  Family / Support Systems Marital Status: Single Children: 2 Primary Children Other Supports: Granddaughter is nurse Anticipated Caregiver: Daughter and granddaughter Ability/Limitations  of Caregiver: yes. Children and granddaughter work during day Caregiver Availability: Intermittent  Social History Preferred language: English Religion: Unknown Cultural Background: Psychiatric nurse, Renovations, Architect Education: High School Read: Yes Write: Yes Employment Status: Retired   Abuse/Neglect Abuse/Neglect Assessment Can Be Completed: Yes Physical Abuse: Denies Verbal Abuse: Denies Sexual Abuse: Denies Exploitation of patient/patient's resources: Denies Self-Neglect: Denies  Emotional Status Pt's affect, behavior and adjustment status: Daughter feels like patient is not enganging as before. Recent Psychosocial Issues: no Psychiatric History: no Substance Abuse History: no  Patient / Family Perceptions, Expectations & Goals Pt/Family understanding of illness & functional limitations: Yes Premorbid pt/family roles/activities: Daughter and Granddaughter gives medications, provide care, meals, etc, girlfriend takes him around and gets him out the house Pt/family expectations/goals: Family goal is to discharge home with [facility].  Community Resources Express Scripts: None Premorbid Home Care/DME Agencies: None Transportation available at discharge: Family able to transport at discharge  Discharge Planning Living Arrangements: [place]: Children, Other relatives(Granddaughter) Type of Residence: Private residence(Front Entrance: 5 Steps, no rails. Back:2 Steps to porch, then 6 Steps to enter, no rails) Insurance Resources: Multimedia programmer (specify) Financial Resources: Radio broadcast assistant Screen Referred: Yes Living Expenses: Own Money Management: Family Does the patient have any problems obtaining your medications?: No [place] Barriers to Discharge: Lack of/limited family support, Home environment access/layout Sw Barriers to Discharge Comments: Patient has steps to enter at both entrances. All family members work during the day. Social Work  Anticipated Follow Up Needs: HH/OP Expected length of stay: 2-3 Weeks  Clinical Impression Sw completed assessment via telephone conference with daughter. Introduced self, explained role and process. SW will  follow up with family questions/concernds.  [name] 08/17/2019, 2:39 PM

## 2019-08-17 NOTE — Evaluation (Signed)
Occupational Therapy Assessment and Plan  Patient Details  Name: Roger Ramirez MRN: [id number] Date of Birth: Mar 23, 1946  OT Diagnosis: abnormal posture, apraxia, cognitive deficits and hemiplegia affecting dominant side Rehab Potential: Rehab Potential (ACUTE ONLY): Fair ELOS: 21-23 days   Today's Date: 08/17/2019 OT Individual Time: 6948-5462 OT Individual Time Calculation (min): 60 min     Problem List:  Patient Active Problem List   Diagnosis Date Noted  . Resolved ischemic cerebrovascular accident (CVA) involving left anterior cerebral artery territory in remote past 08/16/2019  . Benign essential HTN   . Acute ischemic stroke ([place])   . History of CVA (cerebrovascular accident)   . History of pulmonary embolism   . Tobacco abuse   . Essential hypertension   . Obesity (BMI 30.0-34.9) 08/10/2019  . Cerebral infarction ([place])   . CVA (cerebral vascular accident) ([place]) 06/06/2018  . Antiphospholipid syndrome ([place]) 06/06/2018  . Diabetes mellitus type 2, noninsulin dependent ([place]) 06/06/2018  . Hypercholesterolemia with hypertriglyceridemia 01/23/2016  . Impaired fasting glucose 01/23/2016  . B12 deficiency 01/23/2016  . Hypothyroid 01/23/2016  . Cognitive dysfunction 01/23/2016  . Pleuritic chest pain   . Leg DVT (deep venous thromboembolism), acute ([place])   . Near syncope   . Elevated BP   . History of pulmonary embolus (PE)   . Pulmonary embolism ([place]) 12/24/2014    Past Medical History:  Past Medical History:  Diagnosis Date  . Antiphospholipid syndrome ([place])   . CVA (cerebral vascular accident) ([place]) 06/07/2019  . Diabetes mellitus without complication ([place])   . Hypertension   . Hypothyroidism   . Pulmonary embolism [facility])    Past Surgical History: History reviewed. No pertinent surgical history.  Assessment & Plan Clinical Impression: Roger Ramirez is a 74 year old right-handed male with history of saddle pulmonary emboli from presumed antiphospholipid antibody  syndrome maintained on Xarelto, tobacco abuse, hypertension, diabetes mellitus, CVA 2020 with reported cognitive deficits. History taken from chart review and daughter due to processing. Patient lives alone independent prior to admission. He has a son and daughter in the area who check on him as needed and prepare meals. 1 level home 6 steps to entry. He presented on 4/59/21 with right hemiparesis, dysarthria, and AMS. Cranial CT scan unremarkable for acute intracranial process. MRI showed acute infarction affecting the left body of the corpus callosum. CT angiogram head and neck with atherosclerotic irregularity of the left A3 ACA with high-grade stenosis with suspected subsequent occlusion. Echocardiogram with ejection fraction of 65%. Patient did not receive TPA. Follow-up MRI completed 08/15/2019 showed progression on left anterior cerebral artery infarction since the MRI of 08/10/2019 no associated hemorrhage. Neurology consulted with follow-up and currently on Eliquis for CVA prophylaxis. Tolerating a regular diet. Therapy evaluations completed and patient was admitted for a comprehensive rehab program. Please see preadmission assessment from earlier today as well.   Patient transferred to CIR on 08/16/2019 .    Patient currently requires total with basic self-care skills secondary to muscle weakness, decreased cardiorespiratoy endurance, unbalanced muscle activation and motor apraxia, decreased attention to right, decreased awareness, decreased problem solving, decreased safety awareness, decreased memory and delayed processing and decreased sitting balance, decreased standing balance, decreased postural control and decreased balance strategies, hemiplegia.  Prior to hospitalization, patient could completeBADLs with independent .  Patient will benefit from skilled intervention to increase independence with basic self-care skills prior to discharge home with care partner.  Anticipate patient will require 24  hour supervision and minimal physical assistance and follow up  home health.  OT - End of Session Activity Tolerance: Tolerates 10 - 20 min activity with multiple rests Endurance Deficit: Yes Endurance Deficit Description: can only hold self up in full stand in stedy for a few seconds OT Assessment Rehab Potential (ACUTE ONLY): Fair OT Patient demonstrates impairments in the following area(s): Balance;Cognition;Endurance;Motor;Perception;Safety;Sensory;Vision OT Basic ADL's Functional Problem(s): Bathing;Dressing;Toileting OT Transfers Functional Problem(s): Toilet;Tub/Shower OT Additional Impairment(s): None OT Plan OT Intensity: Minimum of 1-2 x/day, 45 to 90 minutes OT Frequency: 5 out of 7 days OT Duration/Estimated Length of Stay: 21-23 days OT Treatment/Interventions: Balance/vestibular training;Cognitive remediation/compensation;Discharge planning;Functional mobility training;Neuromuscular re-education;DME/adaptive equipment instruction;Patient/family education;Psychosocial support;Therapeutic Activities;Self Care/advanced ADL retraining;Therapeutic Exercise;UE/LE Strength taining/ROM;UE/LE Coordination activities;Visual/perceptual remediation/compensation OT Self Feeding Anticipated Outcome(s): independent OT Basic Self-Care Anticipated Outcome(s): min A bathing and LB self care, set up UB OT Toileting Anticipated Outcome(s): mod A OT Bathroom Transfers Anticipated Outcome(s): min A to toilet, mod A to tub OT Recommendation Patient destination: Home Follow Up Recommendations: Home health OT Equipment Recommended: Tub/shower bench   Skilled Therapeutic Intervention Pt seen for initial evaluation and ADL training. Pt received in bed and was able to answer basic questions and was fairly oriented. He knew he had a stroke but was unaware of how it has affected him. Pt was incontinent of urine and his brief and bed were complete soaked.  Pt sat to EOB with mod A but then he could not  hold his balance as he was leaning back and to the R.  If his hands were holding bed rails he could hold himself up, but as soon as he released one hand he would fall back and need max A to maintain balance.  Therefore, he needed  Total A with bathing. With +2 of max A, pt stood up in stedy but could not achieve upright posture for more than a few seconds.  Pt was bathed, new brief and pants donned total A of 2.    From w/c, pt able to sit at sink and brush his teeth, opening and closing toothpaste without A. He then donned his shirt with no cues and brushed his hair.  Pt was able to do much more with his back supported.    Pt resting in wc with belt alarm on and call light in reach.  Reviewed purpose of OT, OT goals and ELOS.  OT Evaluation Precautions/Restrictions  Precautions Precautions: Fall Restrictions Weight Bearing Restrictions: No  Pain Pain Assessment Pain Score: 0-No pain Home Living/Prior Functioning Home Living Family/patient expects to be discharged to:: Private residence Living Arrangements: Alone Available Help at Discharge: Family, Available 24 hours/day Type of Home: House Home Access: Stairs to enter Technical brewer of Steps: 5 Entrance Stairs-Rails: Right, Left Home Layout: One level Bathroom Shower/Tub: Tub/shower unit  Lives With: Alone Prior Function Level of Independence: Independent with basic ADLs, Independent with transfers, Independent with gait Driving: No Vocation: Retired Comments: had recovered gait to walk with no AD outside after first CVA ADL ADL Eating: Set up Grooming: Supervision/safety Where Assessed-Grooming: Sitting at sink Upper Body Bathing: Maximal assistance Where Assessed-Upper Body Bathing: Edge of bed Lower Body Bathing: Dependent Where Assessed-Lower Body Bathing: Edge of bed Upper Body Dressing: Supervision/safety Where Assessed-Upper Body Dressing: Wheelchair Lower Body Dressing: Dependent Where Assessed-Lower  Body Dressing: Edge of bed Vision Baseline Vision/History: Wears glasses Wears Glasses: Reading only Patient Visual Report: No change from baseline Vision Assessment?: Vision impaired- to be further tested in functional context;Yes Ocular Range of Motion: Within Functional Limits Tracking/Visual  Pursuits: Decreased smoothness of vertical tracking;Decreased smoothness of horizontal tracking Visual Fields: (pt did not see target before 45 degrees on B sides) Depth Perception: Overshoots Perception  Perception: Impaired Inattention/Neglect: Does not attend to right visual field Praxis Praxis: Impaired Praxis Impairment Details: Motor planning Cognition Overall Cognitive Status: Impaired/Different from baseline Arousal/Alertness: Awake/alert Orientation Level: Person;Place;Situation Person: Oriented Place: Oriented Situation: Oriented Year: 2021 Month: May Day of Week: Correct Memory: Impaired Memory Impairment: Decreased recall of new information;Decreased short term memory Decreased Short Term Memory: Verbal basic Immediate Memory Recall: Sock;Blue;Bed Memory Recall Sock: Not able to recall Memory Recall Blue: With Cue Memory Recall Bed: Not able to recall Awareness: Impaired Awareness Impairment: Intellectual impairment Problem Solving: Impaired Problem Solving Impairment: Functional basic;Functional complex;Verbal complex Sensation Sensation Light Touch: Appears Intact Hot/Cold: Appears Intact Proprioception: Impaired by gross assessment Stereognosis: Not tested Additional Comments: poor proprioceptive awareness in RLE Coordination Gross Motor Movements are Fluid and Coordinated: No Fine Motor Movements are Fluid and Coordinated: No Finger Nose Finger Test: overshooting on BUE Motor  Motor Motor: Hemiplegia Motor - Skilled Clinical Observations: RLE weakness Mobility  Transfers Sit to Stand: 2 Helpers;Dependent - mechanical lift Stand to Sit: 2 Helpers;Dependent  - mechanical lift  Trunk/Postural Assessment  Cervical Assessment Cervical Assessment: Within Functional Limits Thoracic Assessment Thoracic Assessment: Exceptions to [facility] Thoracic AROM Thoracic Extension: limited Lumbar Assessment Lumbar Assessment: Exceptions to WFL(posterior pelvic tilt) Postural Control Postural Control: Deficits on evaluation Trunk Control: max to total A to maintain static sit EOB Righting Reactions: absent Protective Responses: severely impaired Postural Limitations: pt with posterior and R lateral lean, he can hold static sit with B hands on supports or with back supported in wc  Balance Static Sitting Balance Static Sitting - Level of Assistance: 2: Max assist Static Standing Balance Static Standing - Level of Assistance: 1: +2 Total assist Extremity/Trunk Assessment RUE Assessment Active Range of Motion (AROM) Comments: WFL General Strength Comments: 4-/5 LUE Assessment Active Range of Motion (AROM) Comments: WFL General Strength Comments: 4/5     Refer to Care Plan for Long Term Goals  Recommendations for other services: None    Discharge Criteria: Patient will be discharged from OT if patient refuses treatment 3 consecutive times without medical reason, if treatment goals not met, if there is a change in medical status, if patient makes no progress towards goals or if patient is discharged from hospital.  The above assessment, treatment plan, treatment alternatives and goals were discussed and mutually agreed upon: by patient  [facility] 08/17/2019, 12:25 PM

## 2019-08-17 NOTE — Progress Notes (Signed)
Social Work Patient ID: Roger Ramirez, male   DOB: 14-Aug-1945, 74 y.o.   MRN: [id number]  Sw called daughter to participate in patients assessment. No response. Sw follow up with son, he would like sister to assist with this. Sw followed up with daughter again currently in meeting. SW will continue to follow up.

## 2019-08-17 NOTE — Evaluation (Signed)
Speech Language Pathology Assessment and Plan  Patient Details  Name: Roger Ramirez MRN: [id number] Date of Birth: 1945/05/23  SLP Diagnosis: Speech and Language deficits;Cognitive Impairments  Rehab Potential: Good ELOS: ~3 weeks    Today's Date: 08/17/2019 SLP Individual Time: 0731-0829 SLP Individual Time Calculation (min): 58 min   Problem List:  Patient Active Problem List   Diagnosis Date Noted  . Resolved ischemic cerebrovascular accident (CVA) involving left anterior cerebral artery territory in remote past 08/16/2019  . Benign essential HTN   . Acute ischemic stroke ([place])   . History of CVA (cerebrovascular accident)   . History of pulmonary embolism   . Tobacco abuse   . Essential hypertension   . Obesity (BMI 30.0-34.9) 08/10/2019  . Cerebral infarction ([place])   . CVA (cerebral vascular accident) ([place]) 06/06/2018  . Antiphospholipid syndrome ([place]) 06/06/2018  . Diabetes mellitus type 2, noninsulin dependent ([place]) 06/06/2018  . Hypercholesterolemia with hypertriglyceridemia 01/23/2016  . Impaired fasting glucose 01/23/2016  . B12 deficiency 01/23/2016  . Hypothyroid 01/23/2016  . Cognitive dysfunction 01/23/2016  . Pleuritic chest pain   . Leg DVT (deep venous thromboembolism), acute ([place])   . Near syncope   . Elevated BP   . History of pulmonary embolus ([place])   . Pulmonary embolism ([place]) 12/24/2014   Past Medical History:  Past Medical History:  Diagnosis Date  . Antiphospholipid syndrome ([place])   . CVA (cerebral vascular accident) ([place]) 06/07/2019  . Diabetes mellitus without complication ([place])   . Hypertension   . Hypothyroidism   . Pulmonary embolism [facility])    Past Surgical History: History reviewed. No pertinent surgical history.  Assessment / Plan / Recommendation Clinical Impression   HPI: Roger Ramirez is a 74 year old right-handed male with history of saddle pulmonary emboli from presumed antiphospholipid antibody syndrome maintained on Xarelto,   tobacco abuse, hypertension, diabetes mellitus, CVA 2020 with reported cognitive deficits. History taken from chart review and daughter due to processing. Patient lives alone independent prior to admission.  He has a son and daughter in the area who check on him as needed and prepare meals.  1 level home 6 steps to entry. He presented on 4/59/21 with right hemiparesis, dysarthria, and AMS. Cranial CT scan unremarkable for acute intracranial process. MRI showed acute infarction affecting the left body of the corpus callosum.  CT angiogram head and neck with atherosclerotic irregularity of the left A3 ACA with high-grade stenosis with suspected subsequent occlusion. Echocardiogram with ejection fraction of 65%. Patient did not receive TPA.  Follow-up MRI completed 08/15/2019 showed progression on left anterior cerebral artery infarction  since the MRI of 08/10/2019 no associated hemorrhage.  Neurology consulted with follow-up and currently on Eliquis for CVA prophylaxis.  Tolerating a regular diet.  Therapy evaluations completed and patient was admitted for a comprehensive rehab program 08/16/19 and SLP evaluation was completed 08/17/19 with results as follows:  Pt known to have baseline cognitive impairments, primarily in the area of short term memory, per chart review and report from RN's conversation with family. However, he was living alone with only intermittent help from family to manage medications and finances. Today, pt demonstrated no insight into current physical or cognitive deficits and was unaware of reason for hospitalization. He was oriented to self and place only. He also verbalized poor safety awareness, no recall of any precautions to use in the hospital, and poor basic problem solving related to ADLs. His working memory and processing speed are also reduced, as his verbal responses  were delayed at times and SLP often had to repeat simple directions or questions prior to his verbal or task initiation. He  sustained his attention appropriately throughout session. Scores on [place] Aphasia Battery-R Bedside indicate mild language impairments (Aphasia Bedside score = 81.66 and Bedside Language Score = 76.25 with scores of 76+ indicative of mild impairments). Pt expressed himself in regards to basic needs and desires, personal interests, etc. with only extra time for processing and prompting to continue to elaborate. Pt with very mild word finding deficits during confrontation naming task (18/20 accuracy). Semantic paraphasias noted in 50% of convergent naming tasks. Although cues for recall and prompting for initiate required during responsive and divergent naming tasks, pt was 90-100% accurate. His speech was fully intelligible throughout evaluation. Pt's impairments appear primarily cognitive in nature, (reduced working and short term memory, initiation, problem solving, and intellectual awareness), with mild deficits in expressive language marked by word finding and occasionally semantic paraphasias.   Recommend pt receive skilled ST to address cognitive-linguistic deficits noted above in order to maximize his functional communication, functional independence with cognitive tasks, and safety prior to discharge.    Skilled Therapeutic Interventions          Cognitive-linguistic evaluation was administered and results were reviewed with pt (please see above regarding results).   SLP Assessment  Patient will need skilled [place] [facility] during CIR admission    Recommendations  Oral Care Recommendations: Oral care BID Patient destination: Home Follow up Recommendations: Home Health SLP;24 hour supervision/assistance Equipment Recommended: None recommended by SLP    SLP Frequency 3 to 5 out of 7 days   SLP Duration  SLP Intensity  SLP Treatment/Interventions ~3 weeks  Minumum of 1-2 x/day, 30 to 90 minutes  Cueing hierarchy;Functional tasks;Patient/family education;Therapeutic  Activities;Internal/external aids;Cognitive remediation/compensation;Speech/Language facilitation    Pain Pain Assessment Pain Scale: 0-10 Pain Score: 0-No pain  Prior Functioning Type of Home: House  Lives With: Alone Available Help at Discharge: Family;Available 24 hours/day Vocation: Retired  Programmer, systems Overall Cognitive Status: Impaired/Different from baseline(although known to have baseline memory impairments) Arousal/Alertness: Awake/alert Orientation Level: Oriented to person;Oriented to place;Disoriented to time;Disoriented to situation Attention: Sustained Focused Attention: Appears intact Sustained Attention: Appears intact Memory: Impaired Memory Impairment: Decreased recall of new information;Decreased short term memory Decreased Short Term Memory: Verbal basic;Functional basic Immediate Memory Recall: Sock;Blue;Bed Memory Recall Sock: Not able to recall Memory Recall Blue: With Cue Memory Recall Bed: Not able to recall Awareness: Impaired Awareness Impairment: Intellectual impairment Problem Solving: Impaired Problem Solving Impairment: Functional basic;Verbal basic Executive Function: Decision Making;Initiating;Organizing Organizing: Impaired Organizing Impairment: Verbal basic;Functional basic Decision Making: Impaired Decision Making Impairment: Verbal basic Initiating: Impaired Initiating Impairment: Verbal basic;Functional basic Safety/Judgment: Impaired Comments: Pt with very poor awareness of any physical or cognitive impairments, he needs Total A to rationalize why he couldn't/shouldn't attempt to get out of bed without assistance while in the hospital  Comprehension Auditory Comprehension Overall Auditory Comprehension: Impaired Yes/No Questions: Within Functional Limits Commands: Impaired One Step Basic Commands: 75-100% accurate Two Step Basic Commands: 50-74% accurate Multistep Basic Commands: 25-49% accurate Conversation:  Simple Interfering Components: Working Field seismologist: Slowed speech;Repetition;Visual/Gestural cues Environmental consultant Discrimination: Not tested Reading Comprehension Reading Status: Within funtional limits Expression Expression Primary Mode of Expression: Verbal Verbal Expression Overall Verbal Expression: Impaired Initiation: Impaired Automatic Speech: Name;Social Response Level of Generative/Spontaneous Verbalization: Sentence Repetition: Impaired Level of Impairment: Sentence level(mild, and suspect more due to memory deficit than language) Responsive: 76-100% accurate Confrontation:  Impaired(18/20 accuracy) Convergent: 50-74% accurate Divergent: 75-100% accurate Pragmatics: No impairment Effective Techniques: Open ended questions Non-Verbal Means of Communication: Not applicable Written Expression Dominant Hand: Right Written Expression: Exceptions to [facility] Interfering Components: Legibility Oral Motor Oral Motor/Sensory Function Overall Oral Motor/Sensory Function: Mild impairment Facial Symmetry: Abnormal symmetry right Facial Strength: Within Functional Limits Facial Sensation: Within Functional Limits Lingual ROM: Within Functional Limits Lingual Symmetry: Abnormal symmetry right Lingual Strength: Within Functional Limits Velum: Within Functional Limits Mandible: Within Functional Limits Motor Speech Overall Motor Speech: Appears within functional limits for tasks assessed Phonation: Normal Intelligibility: Intelligible Motor Planning: Witnin functional limits Motor Speech Errors: Not applicable   Intelligibility: Intelligible Short Term Goals: Week 1: SLP Short Term Goal 1 (Week 1): Pt will demonstrate use word finding strategies in functional communication exchanges with Supervision A cues. SLP Short Term Goal 2 (Week 1): Pt will demonstrate 75% accuracy in convergent and divergent naming tasks with Min A cues. SLP Short Term  Goal 3 (Week 1): Pt will demonstate ability to problem solve during basic and familiar tasks with Max A. SLP Short Term Goal 4 (Week 1): Pt will use external aids to recall daily information with Max A. SLP Short Term Goal 5 (Week 1): Pt will identify 1 cognitive and 1 physical impairment with Max A multimodal cues. SLP Short Term Goal 6 (Week 1): Pt will recall 1 safety precaution with Max A multimodal cues.  Refer to Care Plan for Long Term Goals  Recommendations for other services: None   Discharge Criteria: Patient will be discharged from SLP if patient refuses treatment 3 consecutive times without medical reason, if treatment goals not met, if there is a change in medical status, if patient makes no progress towards goals or if patient is discharged from hospital.  The above assessment, treatment plan, treatment alternatives and goals were discussed and mutually agreed upon: by patient  Arbutus Leas 08/17/2019, 12:41 PM

## 2019-08-17 NOTE — Progress Notes (Signed)
Inpatient Rehabilitation  Patient information reviewed and entered into eRehab system by Antonia Culbertson M. Izzah Pasqua, M.A., CCC/SLP, PPS Coordinator.  Information including medical coding, functional ability and quality indicators will be reviewed and updated through discharge.    

## 2019-08-17 NOTE — IPOC Note (Addendum)
Overall Plan of Care [facility]) Patient Details Name: Roger Ramirez MRN: [id number] DOB: 10-Nov-1945  Admitting Diagnosis: Brainstem infarct, acute [facility])  Hospital Problems: Principal Problem:   Brainstem infarct, acute ([place])     Functional Problem List: Nursing Bladder, Bowel, Medication Management, Safety  PT Balance, Perception, Behavior, Safety, Edema, Sensory, Endurance, Skin Integrity, Motor, Nutrition, Pain  OT Balance, Cognition, Endurance, Motor, Perception, Safety, Sensory, Vision  SLP Linguistic, Cognition  TR         Basic ADL's: OT Bathing, Dressing, Toileting     Advanced  ADL's: OT       Transfers: PT Bed Mobility, Bed to Chair, Car, Manufacturing systems engineer, Metallurgist: PT Ambulation, Emergency planning/management officer, Stairs     Additional Impairments: OT None  SLP Communication, Social Cognition expression Problem Solving, Memory, Awareness  TR      Anticipated Outcomes Item Anticipated Outcome  Self Feeding independent  Swallowing      Basic self-care  min A bathing and LB self care, set up UB  Toileting  mod A   Bathroom Transfers min A to toilet, mod A to tub  Bowel/Bladder  cont x 2, supervision assist  Transfers  CGA  Locomotion  min assist using LRAD  Communication  Supervision A  Cognition  Min-Mod A  Pain  less than 3  Safety/Judgment  cues/reminders   Therapy Plan: PT Intensity: Minimum of 1-2 x/day ,45 to 90 minutes PT Frequency: 5 out of 7 days PT Duration Estimated Length of Stay: ~3 weeks OT Intensity: Minimum of 1-2 x/day, 45 to 90 minutes OT Frequency: 5 out of 7 days OT Duration/Estimated Length of Stay: 21-23 days SLP Intensity: Minumum of 1-2 x/day, 30 to 90 minutes SLP Frequency: 3 to 5 out of 7 days SLP Duration/Estimated Length of Stay: ~3 weeks   Due to the current state of emergency, patients may not be receiving their 3-hours of Medicare-mandated therapy.   Team Interventions: Nursing Interventions  Patient/Family Education, Bladder Management, Bowel Management, Disease Management/Prevention, Medication Management, Cognitive Remediation/Compensation, Discharge Planning  PT interventions Ambulation/gait training, Community reintegration, DME/adaptive equipment instruction, Neuromuscular re-education, Psychosocial support, Stair training, UE/LE Strength taining/ROM, Wheelchair propulsion/positioning, Training and development officer, Discharge planning, Functional electrical stimulation, Pain management, Skin care/wound management, Therapeutic Activities, UE/LE Coordination activities, Cognitive remediation/compensation, Disease management/prevention, Functional mobility training, Patient/family education, Splinting/orthotics, Therapeutic Exercise, Visual/perceptual remediation/compensation  OT Interventions Balance/vestibular training, Cognitive remediation/compensation, Discharge planning, Functional mobility training, Neuromuscular re-education, DME/adaptive equipment instruction, Patient/family education, Psychosocial support, Therapeutic Activities, Self Care/advanced ADL retraining, Therapeutic Exercise, UE/LE Strength taining/ROM, UE/LE Coordination activities, Visual/perceptual remediation/compensation  SLP Interventions Cueing hierarchy, Functional tasks, Patient/family education, Therapeutic Activities, Internal/external aids, Cognitive remediation/compensation, Speech/Language facilitation  TR Interventions    SW/CM Interventions Discharge Planning, Psychosocial Support, Patient/Family Education   Barriers to Discharge MD  Medical stability and cognitive deficits  Nursing      PT Inaccessible home environment, Home environment access/layout, Decreased caregiver support, Lack of/limited family support    OT      SLP Other (comments)(baseline impairments) pt with baseline residual cognitive impairments (mainly memory) after pervious CVA in 2020  SW Lack of/limited family support, Home  environment access/layout Patient has steps to enter at both entrances. All family members work during the day.   Team Discharge Planning: Destination: PT-Home ,OT- Home , SLP-Home Projected Follow-up: PT-Home health PT, 24 hour supervision/assistance, OT-  Home health OT, SLP-Home Health SLP, 24 hour supervision/assistance Projected Equipment Needs: PT-To be determined, OT- Tub/shower bench, SLP-None recommended  by SLP Equipment Details: PT- , OT-  Patient/family involved in discharge planning: PT- Patient,  OT-Patient, SLP-Patient  MD ELOS: 18-21d Medical Rehab Prognosis:  Good Assessment:  74 year old right-handed male with history of saddle pulmonary emboli from presumed antiphospholipid antibody syndrome maintained on Xarelto,  tobacco abuse, hypertension, diabetes mellitus, CVA 2020 with reported cognitive deficits. History taken from chart review and daughter due to processing. Patient lives alone independent prior to admission.  He has a son and daughter in the area who check on him as needed and prepare meals.  1 level home 6 steps to entry. He presented on 4/59/21 with right hemiparesis, dysarthria, and AMS. Cranial CT scan unremarkable for acute intracranial process. MRI showed acute infarction affecting the left body of the corpus callosum.  CT angiogram head and neck with atherosclerotic irregularity of the left A3 ACA with high-grade stenosis with suspected subsequent occlusion. Echocardiogram with ejection fraction of 65%. Patient did not receive TPA.  Follow-up MRI completed 08/15/2019 showed progression on left anterior cerebral artery infarction  since the MRI of 08/10/2019 no associated hemorrhage.  Neurology consulted with follow-up and currently on Eliquis for CVA prophylaxis.  Tolerating a regular diet.  Therapy evaluations completed and patient was admitted for a comprehensive rehab program.    Now requiring 24/7 Rehab RN,MD, as well as CIR level PT, OT and SLP.  Treatment team  will focus on ADLs and mobility with goals set at Min/mod A See Team Conference Notes for weekly updates to the plan of care

## 2019-08-18 ENCOUNTER — Inpatient Hospital Stay (HOSPITAL_COMMUNITY): Payer: Medicare Other | Admitting: Physical Therapy

## 2019-08-18 ENCOUNTER — Inpatient Hospital Stay (HOSPITAL_COMMUNITY): Payer: Medicare Other | Admitting: Occupational Therapy

## 2019-08-18 ENCOUNTER — Inpatient Hospital Stay (HOSPITAL_COMMUNITY): Payer: Medicare Other | Admitting: Speech Pathology

## 2019-08-18 DIAGNOSIS — I63322 Cerebral infarction due to thrombosis of left anterior cerebral artery: Secondary | ICD-10-CM | POA: Diagnosis not present

## 2019-08-18 DIAGNOSIS — I6389 Other cerebral infarction: Secondary | ICD-10-CM | POA: Diagnosis present

## 2019-08-18 LAB — GLUCOSE, CAPILLARY
Glucose-Capillary: 125 mg/dL — ABNORMAL HIGH (ref 70–99)
Glucose-Capillary: 150 mg/dL — ABNORMAL HIGH (ref 70–99)
Glucose-Capillary: 161 mg/dL — ABNORMAL HIGH (ref 70–99)
Glucose-Capillary: 133 mg/dL — ABNORMAL HIGH (ref 70–99)

## 2019-08-18 NOTE — Plan of Care (Signed)
  Problem: Consults Goal: RH STROKE PATIENT EDUCATION Description: See Patient Education module for education specifics  Outcome: Progressing   Problem: RH BOWEL ELIMINATION Goal: RH STG MANAGE BOWEL WITH ASSISTANCE Description: STG Manage Bowel with supervision Assistance. Outcome: Progressing Goal: RH STG MANAGE BOWEL W/MEDICATION W/ASSISTANCE Description: STG Manage Bowel with Medication with mod I Assistance. Outcome: Progressing   Problem: RH BLADDER ELIMINATION Goal: RH STG MANAGE BLADDER WITH ASSISTANCE Description: STG Manage Bladder With supervision Assistance Outcome: Progressing   Problem: RH SAFETY Goal: RH STG ADHERE TO SAFETY PRECAUTIONS W/ASSISTANCE/DEVICE Description: STG Adhere to Safety Precautions With cues/reminders Assistance/Device. Outcome: Progressing   Problem: RH COGNITION-NURSING Goal: RH STG USES MEMORY AIDS/STRATEGIES W/ASSIST TO PROBLEM SOLVE Description: STG Uses Memory Aids/Strategies With cues/reminders Assistance to Problem Solve. Outcome: Progressing   Problem: RH KNOWLEDGE DEFICIT Goal: RH STG INCREASE KNOWLEDGE OF HYPERTENSION Description: Pt/family able to demonstrate understanding of HTN management using diet control and medication compliance with booklets/handouts mod I assist prior to DC.  Outcome: Progressing Goal: RH STG INCREASE KNOWLEGDE OF HYPERLIPIDEMIA Description: Pt/family able to demonstrate understanding of HLD management using diet control and medication compliance with booklets/handouts mod I assist prior to DC.  Outcome: Progressing Goal: RH STG INCREASE KNOWLEDGE OF STROKE PROPHYLAXIS Description: Pt/family able to demonstrate understanding of stroke prevention management using diet control and medication compliance with booklets/handouts mod I assist prior to DC.  Outcome: Progressing   

## 2019-08-18 NOTE — Progress Notes (Signed)
Physical Therapy Session Note  Patient Details  Name: Roger Ramirez MRN: [id number] Date of Birth: Mar 12, 1946  Today's Date: 08/18/2019 PT Individual Time: 1105-1200 and 1536-1600 PT Individual Time Calculation (min): 55 min and 24 min  Short Term Goals: Week 1:  PT Short Term Goal 1 (Week 1): Pt will perform supine<>sit with mod assist PT Short Term Goal 2 (Week 1): Pt will perform sit<>stand using LRAD with mod assist of 1 PT Short Term Goal 3 (Week 1): Pt will perform bed<>chair transfers with +2 mod assist PT Short Term Goal 4 (Week 1): Pt will ambulate at least 64ft using LRAD with max assist and +2 w/c follow as needed  Skilled Therapeutic Interventions/Progress Updates:   Session 1: Pt received sitting in w/c and agreeable to therapy session though reports some fatigue after OT session this AM. Transported to/from gym in w/c for time management and energy conservation. Pt continues to demonstrate significantly delayed processing and initiation requiring increased time and cuing to start a task. Sit<>stands x4 in // bars with heavy max assist to come to stand then mod assist to maintain standing with pt initially relying heavily on B UE support on bars to prevent R posterior lean - tolerated standing ~65minutes each bout - progressed to decreased UE support to place L hand on therapist's shoulders then  BUEs on therapist's shoulders with pt requiring max assist to prevent R posterolateral LOB and max manual facilitation for R hip/knee extension to improve upright as pt unable to initiate R LE extension despite max multimodal cuing and mirror feedback. Squat pivot transfers w/c<>EOM with heavy max assist for lifting/pivoting hips. Sitting EOM performed sitting balance/trunk control, R UE NMR, and R attention task to reach across body and then stack legos - requires close supervision with intermittent mod assist to prevent posterior LOB when reaching - requires cuing to reorient to R side and locate  items. Transported back to room and left seated in w/c with needs in reach and seat belt alarm on. Therapist reviewed how to call for nursing assist and notified nursing staff that he needs to be checked on more frequently due to difficulty initiating calling for assistance.  Despite pt breathing heavily throughout session SpO2 97% with activity.   Session 2: Pt received sitting in w/c with hips slid forward too close to the edge and pt noted to have taken seat belt off over his head with alarm still intact Dorathy Daft, RN notified of this event. Therapist assisted pt with scooting back in w/c max assist. Due to smell therapist inquired about needing to use bathroom and pt declines unaware that his brief is soiled in urine. Sit>stand w/c>stedy with +2 max assist for lifting into standing with max manual facilitation for hip/knee extension to clear hips when flipping down stedy seat. Standing in stedy with heavy +1 max assist primarily for hip extension while +2 performed total assist LB clothing management and peri-care. Transported to gym in w/c but pt started to fall asleep in w/c therefore returned to room to assist pt back to bed. L squat pivot to EOB with total assist for lifting/pivoting hips with max/total manual facilitation for anterior trunk lean as pt unable to bring trunk forward during transfer due to fatigue. Sit>supine with max assist for trunk descent and B LE management into the bed. Pt left supine in bed with needs in reach and bed alarm on.  Therapy Documentation Precautions:  Precautions Precautions: Fall Precaution Comments: heavy posterior and right lean sitting  EOB Restrictions Weight Bearing Restrictions: No  Pain:  Session 1: Denies pain during session.  Session 2: No reports of pain throughout session.   Therapy/Group: Individual Therapy  Tawana Scale, PT, DPT 08/18/2019, 7:57 AM

## 2019-08-18 NOTE — Progress Notes (Signed)
[place] PHYSICAL MEDICINE & REHABILITATION PROGRESS NOTE   Subjective/Complaints: No new issues overnight  Review of systems negative for chest pain, shortness of breath, nausea vomiting diarrhea or constipation   Objective:   No results found. Recent Labs    08/16/19 0531 08/17/19 0522  WBC 8.6 9.9  HGB 14.6 14.4  HCT 43.4 43.4  PLT 212 222   Recent Labs    08/16/19 0531 08/17/19 0522  NA 133* 136  K 4.1 4.3  CL 103 104  CO2 22 24  GLUCOSE 123* 126*  BUN 18 17  CREATININE 0.81 0.89  CALCIUM 9.1 9.2    Intake/Output Summary (Last 24 hours) at 08/18/2019 1341 Last data filed at 08/18/2019 0730 Gross per 24 hour  Intake 480 ml  Output --  Net 480 ml     Physical Exam: Vital Signs Blood pressure 117/78, pulse 70, temperature 98.4 F (36.9 C), resp. rate 18, height 5\' 9"  (1.753 m), weight 93.6 kg, SpO2 96 %.    General: No acute distress Mood and affect are appropriate Heart: Regular rate and rhythm no rubs murmurs or extra sounds Lungs: Clear to auscultation, breathing unlabored, no rales or wheezes Abdomen: Positive bowel sounds, soft nontender to palpation, nondistended Extremities: No clubbing, cyanosis, or edema Skin: No evidence of breakdown, no evidence of rash Neurologic: Cranial nerves II through XII intact, motor strength is 5/5 in Bilateral  deltoid, bicep, tricep, grip, 5/5 left and 3/5 Right hip flexor, knee extensors, ankle dorsiflexor and plantar flexor  Musculoskeletal: Full range of motion in all 4 extremities. No joint swelling   Assessment/Plan: 1. Functional deficits secondary to left ACA infarct which require 3+ hours per day of interdisciplinary therapy in a comprehensive inpatient rehab setting.  Physiatrist is providing close team supervision and 24 hour management of active medical problems listed below.  Physiatrist and rehab team continue to assess barriers to discharge/monitor patient progress toward functional and medical  goals  Care Tool:  Bathing  Bathing activity did not occur: Safety/medical concerns Body parts bathed by patient: Face, Right arm, Left arm, Chest, Abdomen, Front perineal area, Right upper leg, Left upper leg   Body parts bathed by helper: Right lower leg, Left lower leg, Buttocks     Bathing assist Assist Level: 2 Helpers     Upper Body Dressing/Undressing Upper body dressing   What is the patient wearing?: Pull over shirt    Upper body assist Assist Level: Minimal Assistance - Patient > 75%    Lower Body Dressing/Undressing Lower body dressing      What is the patient wearing?: Incontinence brief, Pants     Lower body assist Assist for lower body dressing: 2 Helpers     Toileting Toileting    Toileting assist Assist for toileting: Total Assistance - Patient < 25%     Transfers Chair/bed transfer  Transfers assist  Chair/bed transfer activity did not occur: Safety/medical concerns  Chair/bed transfer assist level: Maximal Assistance - Patient 25 - 49%(squat pivot)     Locomotion Ambulation   Ambulation assist   Ambulation activity did not occur: Safety/medical concerns          Walk 10 feet activity   Assist  Walk 10 feet activity did not occur: Safety/medical concerns        Walk 50 feet activity   Assist Walk 50 feet with 2 turns activity did not occur: Safety/medical concerns         Walk 150 feet activity  Assist Walk 150 feet activity did not occur: Safety/medical concerns         Walk 10 feet on uneven surface  activity   Assist Walk 10 feet on uneven surfaces activity did not occur: Safety/medical concerns         Wheelchair     Assist Will patient use wheelchair at discharge?: (TBD)             Wheelchair 50 feet with 2 turns activity    Assist            Wheelchair 150 feet activity     Assist          Blood pressure 117/78, pulse 70, temperature 98.4 F (36.9 C), resp. rate  18, height 5\' 9"  (1.753 m), weight 93.6 kg, SpO2 96 %.  Medical Problem List and Plan: 1.  Right side weakness with dysarthria secondary to left ACA territory acute infarction as well as history of CVA 2020 with cognitive deficits             -patient may shower             -ELOS/Goals: 16-19 days/min/Mod a            CIR PT, OT, SLP 2.  Antithrombotics: -DVT/anticoagulation: Eliquis             -antiplatelet therapy: N/A 3. Pain Management: Tylenol as needed 4. Mood: Melatonin 3 mg nightly             -antipsychotic agents: N/A 5. Neuropsych: This patient is capable of making decisions on his own behalf. 6. Skin/Wound Care: Routine skin checks 7. Fluids/Electrolytes/Nutrition: Routine in and outs. CMP ordered. 8.  History of saddle pulmonary emboli.  Continue Eliquis 9.  Diabetes mellitus.  Hemoglobin A1c 6.1.  SSI.  Patient on Glucophage 1000 mg twice daily prior to admission.  Resume as needed CBG (last 3)  Recent Labs    08/17/19 2200 08/18/19 0643 08/18/19 1203  GLUCAP 132* 125* 150*         controlled 5/7 10.  Hypertension.  Avapro 75 mg daily.           Vitals:   08/17/19 2050 08/18/19 0455  BP: (!) 148/73 117/78  Pulse: 77 70  Resp: 16 18  Temp: 98.5 F (36.9 C) 98.4 F (36.9 C)  SpO2: 93% 96%  controlled 5/7 11.  Hypothyroidism. Continue Synthroid 12.  Hyperlipidemia. Lipitor 13.  History of tobacco abuse.  Counseling    LOS: 2 days A FACE TO [place] [facility] Quisha Mabie 08/18/2019, 1:41 PM

## 2019-08-18 NOTE — Progress Notes (Signed)
Occupational Therapy Session Note  Patient Details  Name: Dina Mobley MRN: [id number] Date of Birth: Apr 10, 1946  Today's Date: 08/18/2019 OT Individual Time: 0903-1000 OT Individual Time Calculation (min): 57 min    Short Term Goals: Week 1:  OT Short Term Goal 1 (Week 1): Pt will be able to sit to EOB with S. OT Short Term Goal 2 (Week 1): Pt will be able to sit unsupported with mod A at EOB. OT Short Term Goal 3 (Week 1): Pt will be able to rise to stand in stedy with max A of 1. OT Short Term Goal 4 (Week 1): Pt will bathe UB with set up from wc level.  Skilled Therapeutic Interventions/Progress Updates:    Pt completed supine to sit EOB with max assist.  Increased posterior lean in sitting requiring total assist to maintain balance.  He needed total assist +2 (pt 20%) for partial stand pivot transfer to the wheelchair.  Pt with decreased full upright posture and squat position maintained during the transfer.  Once in the chair, he worked on bathing sit to stand at the sink.  Max instructional cueing to sequence all bathing step by step with decreased thoroughness noted.  He needed total assist +2 (pt 20%) for all sit to stand during peri washing and when pulling items over his hips.  He continued to demonstrate decreased upright posture with all standing intervals as well.  Flat affect throughout session was noted with min assist for donning pullover shirt and total assist for donning all clothing over his feet.  He was oriented to place and situation this session.  Finished with pt in the wheelchair with call button and phone in reach and safety belt in place.   Therapy Documentation Precautions:  Precautions Precautions: Fall Precaution Comments: heavy posterior and right lean sitting EOB Restrictions Weight Bearing Restrictions: No   Pain: Pain Assessment Pain Scale: Faces Pain Score: 0-No pain ADL: See Care Tool Section for some details of mobility and  selfcare  Therapy/Group: Individual Therapy  Mystic Labo OTR/L 08/18/2019, 10:32 AM

## 2019-08-18 NOTE — Progress Notes (Signed)
Speech Language Pathology Daily Session Note  Patient Details  Name: Roger Ramirez MRN: [id number] Date of Birth: [birth year]  Today's Date: 08/18/2019 SLP Individual Time: 1350-1450 SLP Individual Time Calculation (min): 60 min  Short Term Goals: Week 1: SLP Short Term Goal 1 (Week 1): Pt will demonstrate use word finding strategies in functional communication exchanges with Supervision A cues. SLP Short Term Goal 2 (Week 1): Pt will demonstrate 75% accuracy in convergent and divergent naming tasks with Min A cues. SLP Short Term Goal 3 (Week 1): Pt will demonstate ability to problem solve during basic and familiar tasks with Max A. SLP Short Term Goal 4 (Week 1): Pt will use external aids to recall daily information with Max A. SLP Short Term Goal 5 (Week 1): Pt will identify 1 cognitive and 1 physical impairment with Max A multimodal cues. SLP Short Term Goal 6 (Week 1): Pt will recall 1 safety precaution with Max A multimodal cues.  Skilled Therapeutic Interventions:   Pt was seen for skilled ST targeting cognitive goals.  SLP facilitated the session with max assist multimodal cues to reorient to place, date, and situation using external aids that had been posted around pt's room.  Additionally, SLP initiated a memory notebook to facilitate carryover of daily information.  In it, therapist included history of pt's hospital course, basic introductions to his care team members, and general goals of each therapy discipline.  SLP also recorded details from today's PT, OT, and ST sessions.  Pt required max assist to review all this information.  Towards the end of today's therapy session, pt was noted to repeatedly attempt to remove his seat belt alarm.  Pt was pleasantly redirected and agreeable to wearing his chair alarm and was less preoccupied with the seat belt once TV was turned on to a channel that he liked.  Pt was left in wheelchair with chair alarm set and call bell within reach.  Continue  per current plan of care.    Pain Pain Assessment Pain Scale: 0-10 Pain Score: 0-No pain  Therapy/Group: Individual Therapy  Wilsie Kern, Melanee Spry 08/18/2019, 2:52 PM

## 2019-08-19 ENCOUNTER — Inpatient Hospital Stay (HOSPITAL_COMMUNITY): Payer: Medicare Other

## 2019-08-19 ENCOUNTER — Inpatient Hospital Stay (HOSPITAL_COMMUNITY): Payer: Medicare Other | Admitting: Speech Pathology

## 2019-08-19 DIAGNOSIS — I63322 Cerebral infarction due to thrombosis of left anterior cerebral artery: Secondary | ICD-10-CM | POA: Diagnosis not present

## 2019-08-19 DIAGNOSIS — I6389 Other cerebral infarction: Secondary | ICD-10-CM

## 2019-08-19 LAB — GLUCOSE, CAPILLARY
Glucose-Capillary: 108 mg/dL — ABNORMAL HIGH (ref 70–99)
Glucose-Capillary: 127 mg/dL — ABNORMAL HIGH (ref 70–99)
Glucose-Capillary: 133 mg/dL — ABNORMAL HIGH (ref 70–99)
Glucose-Capillary: 151 mg/dL — ABNORMAL HIGH (ref 70–99)

## 2019-08-19 NOTE — Progress Notes (Signed)
Physical Therapy Session Note  Patient Details  Name: Roger Ramirez MRN: [id number] Date of Birth: October 06, 1945  Today's Date: 08/19/2019 PT Individual Time: 0802-0900 PT Individual Time Calculation (min): 58 min    Short Term Goals: Week 1:  PT Short Term Goal 1 (Week 1): Pt will perform supine<>sit with mod assist PT Short Term Goal 2 (Week 1): Pt will perform sit<>stand using LRAD with mod assist of 1 PT Short Term Goal 3 (Week 1): Pt will perform bed<>chair transfers with +2 mod assist PT Short Term Goal 4 (Week 1): Pt will ambulate at least 3ft using LRAD with max assist and +2 w/c follow as needed  Skilled Therapeutic Interventions/Progress Updates:    Pt supine in bed upon PT arrival, agreeable to therapy tx and denies pain. Therapist donned teds and socks total assist, therapist assisted to loop legs through pants and pt performed bridging/rolling to pull over hips. Pt transferred to sitting EOB with mod assist, initial posterior lean with cues to correct. Pt performed squat pivot to w/c this session max assist, pt able to reach forward/across for armrest with L UE to facilitation anterior weightshift/lean. Pt transported to the gym total assist. Pt performed squat pivot to the mat with max assist, increased time for motor planning/initiation. Pt performed x 3 sit<>stands this session from the mat with mod +2 assist, 3 musketeers technique, therapist providing facilitation for R knee extension and hip extension. In standing pt worked on upright posture and balance to perform reaching activity overhead, mod-max assist for balance and second person helping with task. In sitting this session pt continues with posterior lean at times, worked on sitting balance to correct this while performing reaching activity to pick up items off floor and place in bin overhead. Pt with x1 total LOB posteriorly while sitting on mat today, max assist to return to sitting. Pt ambulated x 6 ft this session with tape  on floor for visual target to step to, max +2 assist 3 musketeers technique with therapist assisting with L knee extension during stance. Pt transported back to room at end of session. In the room performed x 1 sit<>stand with max assist and RW, significant posterior lean & R lateral lean in this position. Pt left in w/c at end of session, needs in reach and chair alarm set.   Therapy Documentation Precautions:  Precautions Precautions: Fall Precaution Comments: heavy posterior and right lean sitting EOB Restrictions Weight Bearing Restrictions: No    Therapy/Group: Individual Therapy  Cresenciano Genre, PT, DPT, CSRS 08/19/2019, 7:45 AM

## 2019-08-19 NOTE — Plan of Care (Signed)
  Problem: Consults Goal: RH STROKE PATIENT EDUCATION Description: See Patient Education module for education specifics  Outcome: Progressing   Problem: RH BOWEL ELIMINATION Goal: RH STG MANAGE BOWEL WITH ASSISTANCE Description: STG Manage Bowel with supervision Assistance. Outcome: Progressing Goal: RH STG MANAGE BOWEL W/MEDICATION W/ASSISTANCE Description: STG Manage Bowel with Medication with mod I Assistance. Outcome: Progressing   Problem: RH BLADDER ELIMINATION Goal: RH STG MANAGE BLADDER WITH ASSISTANCE Description: STG Manage Bladder With supervision Assistance Outcome: Progressing   Problem: RH SAFETY Goal: RH STG ADHERE TO SAFETY PRECAUTIONS W/ASSISTANCE/DEVICE Description: STG Adhere to Safety Precautions With cues/reminders Assistance/Device. Outcome: Progressing   Problem: RH COGNITION-NURSING Goal: RH STG USES MEMORY AIDS/STRATEGIES W/ASSIST TO PROBLEM SOLVE Description: STG Uses Memory Aids/Strategies With cues/reminders Assistance to Problem Solve. Outcome: Progressing   Problem: RH KNOWLEDGE DEFICIT Goal: RH STG INCREASE KNOWLEDGE OF HYPERTENSION Description: Pt/family able to demonstrate understanding of HTN management using diet control and medication compliance with booklets/handouts mod I assist prior to DC.  Outcome: Progressing Goal: RH STG INCREASE KNOWLEGDE OF HYPERLIPIDEMIA Description: Pt/family able to demonstrate understanding of HLD management using diet control and medication compliance with booklets/handouts mod I assist prior to DC.  Outcome: Progressing Goal: RH STG INCREASE KNOWLEDGE OF STROKE PROPHYLAXIS Description: Pt/family able to demonstrate understanding of stroke prevention management using diet control and medication compliance with booklets/handouts mod I assist prior to DC.  Outcome: Progressing

## 2019-08-19 NOTE — Progress Notes (Signed)
[facility] PHYSICAL MEDICINE & REHABILITATION PROGRESS NOTE   Subjective/Complaints: Patient asleep with fork in his hand and egg on fork with hand on tray table.  ROS: Patient denies fever, rash, sore throat, blurred vision, nausea, vomiting, diarrhea, cough, shortness of breath or chest pain, joint or back pain, headache, or mood change.   Objective:   No results found. Recent Labs    08/17/19 0522  WBC 9.9  HGB 14.4  HCT 43.4  PLT 222   Recent Labs    08/17/19 0522  NA 136  K 4.3  CL 104  CO2 24  GLUCOSE 126*  BUN 17  CREATININE 0.89  CALCIUM 9.2    Intake/Output Summary (Last 24 hours) at 08/19/2019 1151 Last data filed at 08/19/2019 0802 Gross per 24 hour  Intake 660 ml  Output --  Net 660 ml     Physical Exam: Vital Signs Blood pressure 120/73, pulse 66, temperature 98.5 F (36.9 C), resp. rate 18, height 5\' 9"  (1.753 m), weight 93.6 kg, SpO2 99 %.    Constitutional: No distress . Vital signs reviewed. HEENT: EOMI, oral membranes moist Neck: supple Cardiovascular: RRR without murmur. No JVD    Respiratory/Chest: CTA Bilaterally without wheezes or rales. Normal effort    GI/Abdomen: BS +, non-tender, non-distended Ext: no clubbing, cyanosis, or edema Psych: pleasant and cooperative Skin: No evidence of breakdown, no evidence of rash Neurologic: slow to process. Follows commands. Fairly alert after I woke him up. Cranial nerves II through XII intact, motor strength is 5/5 in Bilateral  deltoid, bicep, tricep, grip, 5/5 left and 3/5 Right hip flexor, knee extensors, ankle dorsiflexor and plantar flexor  Musculoskeletal: Full range of motion in all 4 extremities. No joint swelling   Assessment/Plan: 1. Functional deficits secondary to left ACA infarct which require 3+ hours per day of interdisciplinary therapy in a comprehensive inpatient rehab setting.  Physiatrist is providing close team supervision and 24 hour management of active medical problems  listed below.  Physiatrist and rehab team continue to assess barriers to discharge/monitor patient progress toward functional and medical goals  Care Tool:  Bathing  Bathing activity did not occur: Safety/medical concerns Body parts bathed by patient: Face, Right arm, Left arm, Chest, Abdomen, Front perineal area, Right upper leg, Left upper leg   Body parts bathed by helper: Right lower leg, Left lower leg, Buttocks     Bathing assist Assist Level: 2 Helpers     Upper Body Dressing/Undressing Upper body dressing   What is the patient wearing?: Pull over shirt    Upper body assist Assist Level: Minimal Assistance - Patient > 75%    Lower Body Dressing/Undressing Lower body dressing      What is the patient wearing?: Incontinence brief     Lower body assist Assist for lower body dressing: Maximal Assistance - Patient 25 - 49%     Toileting Toileting    Toileting assist Assist for toileting: Total Assistance - Patient < 25%     Transfers Chair/bed transfer  Transfers assist  Chair/bed transfer activity did not occur: Safety/medical concerns  Chair/bed transfer assist level: Maximal Assistance - Patient 25 - 49%     Locomotion Ambulation   Ambulation assist   Ambulation activity did not occur: Safety/medical concerns  Assist level: 2 helpers Assistive device: Other (comment)(3 musketeers) Max distance: 6 ft   Walk 10 feet activity   Assist  Walk 10 feet activity did not occur: Safety/medical concerns  Walk 50 feet activity   Assist Walk 50 feet with 2 turns activity did not occur: Safety/medical concerns         Walk 150 feet activity   Assist Walk 150 feet activity did not occur: Safety/medical concerns         Walk 10 feet on uneven surface  activity   Assist Walk 10 feet on uneven surfaces activity did not occur: Safety/medical concerns         Wheelchair     Assist Will patient use wheelchair at discharge?:  (TBD)             Wheelchair 50 feet with 2 turns activity    Assist            Wheelchair 150 feet activity     Assist          Blood pressure 120/73, pulse 66, temperature 98.5 F (36.9 C), resp. rate 18, height 5\' 9"  (1.753 m), weight 93.6 kg, SpO2 99 %.  Medical Problem List and Plan: 1.  Right side weakness with dysarthria secondary to left ACA territory acute infarction as well as history of CVA 2020 with cognitive deficits             -patient may shower             -ELOS/Goals: 16-19 days/min/Mod a            CIR PT, OT, SLP 2.  Antithrombotics: -DVT/anticoagulation: Eliquis             -antiplatelet therapy: N/A 3. Pain Management: Tylenol as needed 4. Mood: Melatonin 3 mg nightly             -antipsychotic agents: N/A 5. Neuropsych: This patient is capable of making decisions on his own behalf. 6. Skin/Wound Care: Routine skin checks 7. Fluids/Electrolytes/Nutrition: Routine in and outs. CMP ordered. 8.  History of saddle pulmonary emboli.  Continue Eliquis 9.  Diabetes mellitus.  Hemoglobin A1c 6.1.  SSI.  Patient on Glucophage 1000 mg twice daily prior to admission.  Resume as needed CBG (last 3)  Recent Labs    08/18/19 1633 08/18/19 2113 08/19/19 0619  GLUCAP 161* 133* 108*         controlled 5/8 10.  Hypertension.  Avapro 75 mg daily.           Vitals:   08/18/19 2057 08/19/19 0331  BP: 126/68 120/73  Pulse: 76 66  Resp: 18 18  Temp: 98 F (36.7 C) 98.5 F (36.9 C)  SpO2: 96% 99%  controlled 5/8 11.  Hypothyroidism. Continue Synthroid 12.  Hyperlipidemia. Lipitor 13.  History of tobacco abuse.  Counseling    LOS: 3 days A FACE TO FACE EVALUATION WAS PERFORMED  7/8 08/19/2019, 11:51 AM

## 2019-08-19 NOTE — Progress Notes (Signed)
Speech Language Pathology Daily Session Note  Patient Details  Name: Roger Ramirez MRN: [id number] Date of Birth: 06-03-1945  Today's Date: 08/19/2019 SLP Individual Time: 1300-1400 SLP Individual Time Calculation (min): 60 min  Short Term Goals: Week 1: SLP Short Term Goal 1 (Week 1): Pt will demonstrate use word finding strategies in functional communication exchanges with Supervision A cues. SLP Short Term Goal 2 (Week 1): Pt will demonstrate 75% accuracy in convergent and divergent naming tasks with Min A cues. SLP Short Term Goal 3 (Week 1): Pt will demonstate ability to problem solve during basic and familiar tasks with Max A. SLP Short Term Goal 4 (Week 1): Pt will use external aids to recall daily information with Max A. SLP Short Term Goal 5 (Week 1): Pt will identify 1 cognitive and 1 physical impairment with Max A multimodal cues. SLP Short Term Goal 6 (Week 1): Pt will recall 1 safety precaution with Max A multimodal cues.  Skilled Therapeutic Interventions: Pt was seen for skilled ST targeting cognition. Pt required fluctuating Min to Mod A multimodal cues for redirection to both structured and functional tasks in a quiet controlled environment. He required Mod A verbal and visual cues for problem solving use of call bell as remote for TV but increased Max A to demonstrate it's use as means of requesting assistance. Pt with slight increase in intellectual awareness but still reduced safety awareness - he did state he needs "someone to take care of everything". He required Mod A verbal and visual cues to follow basic directions to reposition himself X3 when he began to slide/lean to right side of bed. Overall Max A required for locating and using memory notebook in functional manner. Pt sorted cards by color (6 total colors) with 100% accuracy and only Supervision A for problem solving, but increased Max A verbal and visual cueing required for recall and problem solving when task changed  to sort by shape instead of color (6 shapes total). Pt recalled 1 event from session for ST to record in memory notebook and able to turn on TV himself at end of session. Pt left laying in bed with alarm set and needs met to his satisfaction. Continue per current plan of care.            Pain    Therapy/Group: Individual Therapy  Arbutus Leas 08/19/2019, 7:16 AM

## 2019-08-19 NOTE — Plan of Care (Signed)
  Problem: RH SAFETY Goal: RH STG ADHERE TO SAFETY PRECAUTIONS W/ASSISTANCE/DEVICE Description: STG Adhere to Safety Precautions With cues/reminders Assistance/Device. Outcome: Progressing   

## 2019-08-19 NOTE — Progress Notes (Signed)
Occupational Therapy Session Note  Patient Details  Name: Roger Ramirez MRN: [id number] Date of Birth: [birth year]  Today's Date: 08/19/2019 OT Individual Time: 5397-6734 OT Individual Time Calculation (min): 71 min    Short Term Goals: Week 1:  OT Short Term Goal 1 (Week 1): Pt will be able to sit to EOB with S. OT Short Term Goal 2 (Week 1): Pt will be able to sit unsupported with mod A at EOB. OT Short Term Goal 3 (Week 1): Pt will be able to rise to stand in stedy with max A of 1. OT Short Term Goal 4 (Week 1): Pt will bathe UB with set up from wc level.  Skilled Therapeutic Interventions/Progress Updates:    1:1. Pt received in bed agreeable to OT session. Pt supine>sittng EOB with MAX and requires overall MOD A to maintain sitting balance while donning pants. Pt able to reach forward to floor with facilitation to thread LLE after OT threads R to encourage anterior weight shift. Pt sit to stand in steady with total A of 2 to advance pants past hips and transfer into w/c. Pt completes activities at sink bathing with pt scooted back from sink facilitating anterior weight shift to reach items on R/wash cloth in sink. Pt requires mod HOH A to wash LUE with RUE with improved initiation of hand/wrist movements>shoulder/elbow movements. Pt requires MAX A to don shirt with mod VC for sequencing through oral care at sink. MAX squat pivot transfers w/c<>EOM with increased time for initiation/motor planning and hand placement far L on mat. Pt sways with OT holding B hands in max ranges outside BOS laterally and anteriorly to encourage finding midline/trunk control. Reaching activity with bean bags and pt spontaneously reaches with RUE across midline to L with min facilitation at shoulder however pt has difficulty motor planning throwing into bucket. Exited session with tp seated in bed, exit alarm on and call light in reach  Therapy Documentation Precautions:  Precautions Precautions: Fall Precaution  Comments: heavy posterior and right lean sitting EOB Restrictions Weight Bearing Restrictions: No General:   Vital Signs:   Pain: Pain Assessment Pain Scale: 0-10 Pain Score: 0-No pain ADL: ADL Eating: Set up Grooming: Supervision/safety Where Assessed-Grooming: Sitting at sink Upper Body Bathing: Maximal assistance Where Assessed-Upper Body Bathing: Edge of bed Lower Body Bathing: Dependent Where Assessed-Lower Body Bathing: Edge of bed Upper Body Dressing: Supervision/safety Where Assessed-Upper Body Dressing: Wheelchair Lower Body Dressing: Dependent Where Assessed-Lower Body Dressing: Edge of bed Vision   Perception    Praxis   Exercises:   Other Treatments:     Therapy/Group: Individual Therapy  Shon Hale 08/19/2019, 11:56 AM

## 2019-08-20 LAB — GLUCOSE, CAPILLARY
Glucose-Capillary: 116 mg/dL — ABNORMAL HIGH (ref 70–99)
Glucose-Capillary: 139 mg/dL — ABNORMAL HIGH (ref 70–99)
Glucose-Capillary: 146 mg/dL — ABNORMAL HIGH (ref 70–99)
Glucose-Capillary: 156 mg/dL — ABNORMAL HIGH (ref 70–99)

## 2019-08-20 NOTE — Plan of Care (Signed)
  Problem: RH SAFETY Goal: RH STG ADHERE TO SAFETY PRECAUTIONS W/ASSISTANCE/DEVICE Description: STG Adhere to Safety Precautions With cues/reminders Assistance/Device. Outcome: Progressing   Problem: RH COGNITION-NURSING Goal: RH STG USES MEMORY AIDS/STRATEGIES W/ASSIST TO PROBLEM SOLVE Description: STG Uses Memory Aids/Strategies With cues/reminders Assistance to Problem Solve. Outcome: Progressing   

## 2019-08-21 ENCOUNTER — Inpatient Hospital Stay (HOSPITAL_COMMUNITY): Payer: Medicare Other

## 2019-08-21 ENCOUNTER — Inpatient Hospital Stay (HOSPITAL_COMMUNITY): Payer: Medicare Other | Admitting: Occupational Therapy

## 2019-08-21 ENCOUNTER — Inpatient Hospital Stay (HOSPITAL_COMMUNITY): Payer: Medicare Other | Admitting: Speech Pathology

## 2019-08-21 DIAGNOSIS — I63322 Cerebral infarction due to thrombosis of left anterior cerebral artery: Secondary | ICD-10-CM | POA: Diagnosis not present

## 2019-08-21 LAB — GLUCOSE, CAPILLARY
Glucose-Capillary: 113 mg/dL — ABNORMAL HIGH (ref 70–99)
Glucose-Capillary: 161 mg/dL — ABNORMAL HIGH (ref 70–99)
Glucose-Capillary: 118 mg/dL — ABNORMAL HIGH (ref 70–99)
Glucose-Capillary: 136 mg/dL — ABNORMAL HIGH (ref 70–99)

## 2019-08-21 NOTE — Progress Notes (Addendum)
[facility] PHYSICAL MEDICINE & REHABILITATION PROGRESS NOTE   Subjective/Complaints:  No issues overnite , pt thought he saw me yesterday ( 3 days ago)   ROS: Patient denies CP, SOB, N/V/D     Objective:   No results found. No results for input(s): WBC, HGB, HCT, PLT in the last 72 hours. No results for input(s): NA, K, CL, CO2, GLUCOSE, BUN, CREATININE, CALCIUM in the last 72 hours.  Intake/Output Summary (Last 24 hours) at 08/21/2019 0918 Last data filed at 08/20/2019 1830 Gross per 24 hour  Intake 460 ml  Output -  Net 460 ml     Physical Exam: Vital Signs Blood pressure (!) 154/60, pulse 64, temperature 98.3 F (36.8 C), temperature source Oral, resp. rate 18, height 5\' 9"  (1.753 m), weight 93.6 kg, SpO2 96 %.    Constitutional: No distress . Vital signs reviewed.  General: No acute distress Mood and affect are appropriate Heart: Regular rate and rhythm no rubs murmurs or extra sounds Lungs: Clear to auscultation, breathing unlabored, no rales or wheezes Abdomen: Positive bowel sounds, soft nontender to palpation, nondistended Extremities: No clubbing, cyanosis, or edema Skin: No evidence of breakdown, no evidence of rash  Tone MAS 2-3 in Right hamstrings  Neurologic: slow to process. Follows commands. Fairly alert after I woke him up. Cranial nerves II through XII intact, motor strength is 5/5 in Bilateral  deltoid, bicep, tricep, grip, 5/5 left and 3/5 Right hip flexor, knee extensors, ankle dorsiflexor and plantar flexor  Musculoskeletal: Full range of motion in all 4 extremities. No joint swelling   Assessment/Plan: 1. Functional deficits secondary to left ACA infarct which require 3+ hours per day of interdisciplinary therapy in a comprehensive inpatient rehab setting.  Physiatrist is providing close team supervision and 24 hour management of active medical problems listed below.  Physiatrist and rehab team continue to assess barriers to discharge/monitor  patient progress toward functional and medical goals  Care Tool:  Bathing  Bathing activity did not occur: Safety/medical concerns Body parts bathed by patient: Face, Right arm, Left arm, Chest, Abdomen, Front perineal area, Right upper leg, Left upper leg   Body parts bathed by helper: Right lower leg, Left lower leg, Buttocks     Bathing assist Assist Level: 2 Helpers     Upper Body Dressing/Undressing Upper body dressing   What is the patient wearing?: Pull over shirt    Upper body assist Assist Level: Minimal Assistance - Patient > 75%    Lower Body Dressing/Undressing Lower body dressing      What is the patient wearing?: Incontinence brief     Lower body assist Assist for lower body dressing: Maximal Assistance - Patient 25 - 49%     Toileting Toileting    Toileting assist Assist for toileting: Total Assistance - Patient < 25%     Transfers Chair/bed transfer  Transfers assist  Chair/bed transfer activity did not occur: Safety/medical concerns  Chair/bed transfer assist level: Maximal Assistance - Patient 25 - 49%     Locomotion Ambulation   Ambulation assist   Ambulation activity did not occur: Safety/medical concerns  Assist level: 2 helpers Assistive device: Other (comment)(3 musketeers) Max distance: 6 ft   Walk 10 feet activity   Assist  Walk 10 feet activity did not occur: Safety/medical concerns        Walk 50 feet activity   Assist Walk 50 feet with 2 turns activity did not occur: Safety/medical concerns  Walk 150 feet activity   Assist Walk 150 feet activity did not occur: Safety/medical concerns         Walk 10 feet on uneven surface  activity   Assist Walk 10 feet on uneven surfaces activity did not occur: Safety/medical concerns         Wheelchair     Assist Will patient use wheelchair at discharge?: (TBD)             Wheelchair 50 feet with 2 turns activity    Assist             Wheelchair 150 feet activity     Assist          Blood pressure (!) 154/60, pulse 64, temperature 98.3 F (36.8 C), temperature source Oral, resp. rate 18, height 5\' 9"  (1.753 m), weight 93.6 kg, SpO2 96 %.  Medical Problem List and Plan: 1.  Right side weakness with dysarthria secondary to left ACA territory acute infarction as well as history of CVA 2020 with cognitive deficits Cognition and RLE weakness are main issues              -patient may shower             -ELOS/Goals: 16-19 days/min/Mod a            CIR PT, OT, SLP 2.  Antithrombotics: -DVT/anticoagulation: Eliquis             -antiplatelet therapy: N/A 3. Pain Management: Tylenol as needed 4. Mood: Melatonin 3 mg nightly             -antipsychotic agents: N/A 5. Neuropsych: This patient is capable of making decisions on his own behalf. 6. Skin/Wound Care: Routine skin checks 7. Fluids/Electrolytes/Nutrition: Routine in and outs. CMP ordered. 8.  History of saddle pulmonary emboli.  Continue Eliquis 9.  Diabetes mellitus.  Hemoglobin A1c 6.1.  SSI.  Patient on Glucophage 1000 mg twice daily prior to admission.  Resume as needed CBG (last 3)  Recent Labs    08/20/19 1716 08/20/19 2119 08/21/19 0600  GLUCAP 146* 156* 113*         controlled 5/10 10.  Hypertension.  Avapro 75 mg daily.           Vitals:   08/20/19 2013 08/21/19 0347  BP: 127/69 (!) 154/60  Pulse: 83 64  Resp: 16 18  Temp: 98.2 F (36.8 C) 98.3 F (36.8 C)  SpO2: 95% 96%  Labile will monitor  5/10 11.  Hypothyroidism. Continue Synthroid 12.  Hyperlipidemia. Lipitor 13.  History of tobacco abuse.  Counseling    LOS: 5 days A FACE TO FACE EVALUATION WAS PERFORMED  Charlett Blake 08/21/2019, 9:18 AM

## 2019-08-21 NOTE — Progress Notes (Signed)
Speech Language Pathology Daily Session Note  Patient Details  Name: Roger Ramirez MRN: [id number] Date of Birth: 07/13/1945  Today's Date: 08/21/2019 SLP Individual Time: 7353-2992 SLP Individual Time Calculation (min): 43 min  Short Term Goals: Week 1: SLP Short Term Goal 1 (Week 1): Pt will demonstrate use word finding strategies in functional communication exchanges with Supervision A cues. SLP Short Term Goal 2 (Week 1): Pt will demonstrate 75% accuracy in convergent and divergent naming tasks with Min A cues. SLP Short Term Goal 3 (Week 1): Pt will demonstate ability to problem solve during basic and familiar tasks with Max A. SLP Short Term Goal 4 (Week 1): Pt will use external aids to recall daily information with Max A. SLP Short Term Goal 5 (Week 1): Pt will identify 1 cognitive and 1 physical impairment with Max A multimodal cues. SLP Short Term Goal 6 (Week 1): Pt will recall 1 safety precaution with Max A multimodal cues.  Skilled Therapeutic Interventions: Pt was seen for skilled ST targeting cognition. Provided Moderate verbal and visual cues, pt identified and verbalized awareness of purpose of memory notebook. He also used it to recall events from therapy over weekend with Mod A. SLP modified pt's daily schedule to simplified form, which pt used to anticipate his sessions and with Mod A verbal and visual cueing. He required Max A verbal cues to recall general purpose/goals of each therapy discipline. Pt with recall of how to use call bell for remote function, but not to request assistance. RN made aware of pt's difficulty with carryover and need for full supervision during meals (due to cognition). Pt was highly engaged in novel basic card task (WAR), during which he only required Min A for sustained attention and awareness of functional errors. Pt initially disoriented to situation but did recall "stoke" as reason for admission at end of session (~15 minute delay recall). Pt  expressed his wants, needs, with Min A verbal and semantic cues for word finding and clarification of messages throughout session. Pt's memory notebook updated, pt left in bed with alarm on and needs within reach. Continue per current plan of care.        Pain Pain Assessment Pain Scale: Faces Pain Score: 0-No pain  Therapy/Group: Individual Therapy  Roger Ramirez 08/21/2019, 6:54 AM

## 2019-08-21 NOTE — Progress Notes (Signed)
Physical Therapy Session Note  Patient Details  Name: Roger Ramirez MRN: [id number] Date of Birth: [birth year]  Today's Date: 08/21/2019 PT Individual Time: 1302-1400 and 6295-2841 PT Individual Time Calculation (min): 58 min and 40 min    Short Term Goals: Week 1:  PT Short Term Goal 1 (Week 1): Pt will perform supine<>sit with mod assist PT Short Term Goal 2 (Week 1): Pt will perform sit<>stand using LRAD with mod assist of 1 PT Short Term Goal 3 (Week 1): Pt will perform bed<>chair transfers with +2 mod assist PT Short Term Goal 4 (Week 1): Pt will ambulate at least 59ft using LRAD with max assist and +2 w/c follow as needed  Skilled Therapeutic Interventions/Progress Updates:    Session 1: Pt seated in w/c upon PT arrival, agreeable to therapy tx and denies pain. Pt transported to the gym in w/c. Pt performed stand pivot to the mat with RW and max assist, continues with strong posterior lean, assist to weightshift and difficulty stepping to move feet. Pt continues with decreased processing, slow to initiate and motor apraxia throughout the session, limiting mobility. Pt performed sit<>stands from mat throughout session x6 with RW and mod-max assist, in standing worked on reaching activity to facilitate anterior weightshift and limit posterior lean x 2 trials, continues to require mod assist for balance. Therapist used wedge under feet to raise heels and promote anterior wegithshift, pt standing on this demonstrates minmized posterior lean however increased flexion at hip/knees - x 2 trials standing like this while performing reaching task. Pt reports incontinence of bowel, transported back to room. Used stedy for sit<>stand and maintained standing min assist while second helper assisted with pericare, changing brief and clothing management. Pt transported back to gym. Stand pivot to the mat with max assist and RW, cues for techniques and foot placement. Pt performed x 2 more standing bouts with  RW with heels lifted using red wegde, able to maintain static standing in this position 2 x 1 min with CGA and flexed posture, also performed reaching task with min assist. Stand pivot to w/c with mod assist and RW, transported back to room and left in w/c with needs in reach and chair alarm set.   Session 2: Pt supine laying crooked in bed with legs hanging off, therapist assisted pt to readjust in bed min-mod assist. Pt appears confused/more disoriented this afternoon, reports "I was trying to go next door because that is where my stuff is." Therapist reoriented patient to place and situation. Pt agreeable to therapy tx and denies pain. Pt transferred to sitting with min assist, mod assist squat pivot to the w/c and transported to the gym. Pt performed stand pivot to the mat with RW and mod assist, cues for foot placement and RW management. Pt worked on ambulation this session using RW and mod assist (+2 for w/c follow) - therapist helping to guide RW during ambulation to promote anterior weightshift, cues for increased step length on R, lack of foot clearance noted on R side- ambulated x 14 ft, x 29 ft and 25 ft. Pt transported back to room, stand pivot to bed with mod assist. Sit>supine mod assist and left with needs in reach and bed alarm set.    Therapy Documentation Precautions:  Precautions Precautions: Fall Precaution Comments: heavy posterior and right lean sitting EOB Restrictions Weight Bearing Restrictions: No    Therapy/Group: Individual Therapy  Cresenciano Genre, PT, DPT, CSRS 08/21/2019, 7:58 AM

## 2019-08-21 NOTE — Progress Notes (Signed)
Occupational Therapy Session Note  Patient Details  Name: Roger Ramirez MRN: [id number] Date of Birth: Nov 21, 1945  Today's Date: 08/21/2019 OT Individual Time: 1003-1059 OT Individual Time Calculation (min): 56 min    Short Term Goals: Week 1:  OT Short Term Goal 1 (Week 1): Pt will be able to sit to EOB with S. OT Short Term Goal 2 (Week 1): Pt will be able to sit unsupported with mod A at EOB. OT Short Term Goal 3 (Week 1): Pt will be able to rise to stand in stedy with max A of 1. OT Short Term Goal 4 (Week 1): Pt will bathe UB with set up from wc level.  Skilled Therapeutic Interventions/Progress Updates:    Pt in bed to start session.  He was able to state place and reason for hospitalization.  He was also able to state the month but missed the day of the week stating "Tuesday".  He transferred from supine to sitting at the EOB with mod assist and max instructional cueing to roll and push up off of the bed rail.  He was then able to complete squat pivot transfer with max assist to the wheelchair for bathing and dressing at the sink.  He was able to complete all UB selfcare with supervision and mod instructional cueing.  LB bathing and dressing were completed at max assist sit to stand.  Increased posterior lean is noted in standing with decreased full upright standing.  He continues to exhibit increased knee flexion as well as increased trunk flexion.  He was able to complete donning gripper socks with min assist and mod instructional cueing for crossing LEs over the opposite knee after therapist applied TED stockings.  Pt finished session with pt in the wheelchair with call button and phone in reach with safety alarm belt.    Therapy Documentation Precautions:  Precautions Precautions: Fall Precaution Comments: posterior lean in standing Restrictions Weight Bearing Restrictions: No  Pain: Pain Assessment Pain Scale: Faces Pain Score: 0-No pain ADL: See Care Tool Section for some  details of mobility and selfcare  Therapy/Group: Individual Therapy  Kathryn Cosby OTR/L 08/21/2019, 10:58 AM

## 2019-08-22 ENCOUNTER — Inpatient Hospital Stay (HOSPITAL_COMMUNITY): Payer: Medicare Other | Admitting: Occupational Therapy

## 2019-08-22 ENCOUNTER — Inpatient Hospital Stay (HOSPITAL_COMMUNITY): Payer: Medicare Other | Admitting: Speech Pathology

## 2019-08-22 ENCOUNTER — Inpatient Hospital Stay (HOSPITAL_COMMUNITY): Payer: Medicare Other | Admitting: Physical Therapy

## 2019-08-22 LAB — GLUCOSE, CAPILLARY
Glucose-Capillary: 122 mg/dL — ABNORMAL HIGH (ref 70–99)
Glucose-Capillary: 148 mg/dL — ABNORMAL HIGH (ref 70–99)
Glucose-Capillary: 131 mg/dL — ABNORMAL HIGH (ref 70–99)
Glucose-Capillary: 130 mg/dL — ABNORMAL HIGH (ref 70–99)

## 2019-08-22 NOTE — Progress Notes (Signed)
Occupational Therapy Session Note  Patient Details  Name: Roger Ramirez MRN: [id number] Date of Birth: 07/25/1945  Today's Date: 08/22/2019 OT Individual Time: 5427-0623 OT Individual Time Calculation (min): 58 min    Short Term Goals: Week 1:  OT Short Term Goal 1 (Week 1): Pt will be able to sit to EOB with S. OT Short Term Goal 2 (Week 1): Pt will be able to sit unsupported with mod A at EOB. OT Short Term Goal 3 (Week 1): Pt will be able to rise to stand in stedy with max A of 1. OT Short Term Goal 4 (Week 1): Pt will bathe UB with set up from wc level.  Skilled Therapeutic Interventions/Progress Updates:    Pt completed functional transfer from the wheelchair to the tub bench for showering with use of the Stedy.  He was able to complete sit to stand in the [place] with overall min assist.  Once sitting on the shower seat he was able to remove his gown, brief, and gripper socks with mod assist sit to stand.  He completed bathing with overall mod assist and max instructional cueing to sequence and for thoroughness.  He needed cueing to apply soap to his head as well as his washcloth as he felt just running water over his body was efficient.  Noted pt with some bowel incontinence, so he was asked if he felt like he could try to go to the bathroom once his shower was completed,  He responded "yes".   Once bathing was completed, he needed mod assist for drying off sit to stand and then completed transfer over to the 3:1 over the toilet with use of the Stedy.  He was successful in completion of a small BM with mod assist for toilet hygiene in standing in the [place].  He was then transferred out to the wheelchair at the sink for dressing.  Supervision for donning a pullover shirt with mod assist for donning brief and pants sit to stand.  Pt still with increased posterior lean and knee flexion in standing.  He needed max instructional cueing to apply deodorant as he was not sure what to do with it when  given it to him.  Therapist applied TEDs and then pt donned his gripper socks with mod assist.  Finished session with pt in the wheelchair with call button and phone in reach with safety belt in place.   Therapy Documentation Precautions:  Precautions Precautions: Fall Precaution Comments: posterior lean in standing Restrictions Weight Bearing Restrictions: No   Pain: Pain Assessment Pain Scale: Faces Pain Score: 0-No pain ADL: See Care Tool Section for some details of mobility and selfcare  Therapy/Group: Individual Therapy  Shakayla Hickox OTR/L 08/22/2019, 12:19 PM

## 2019-08-22 NOTE — Progress Notes (Signed)
Occupational Therapy Session Note  Patient Details  Name: Jocob Dambach MRN: [id number] Date of Birth: May 31, 1945  Today's Date: 08/22/2019 OT Individual Time:  -       Short Term Goals: Week 1:  OT Short Term Goal 1 (Week 1): Pt will be able to sit to EOB with S. OT Short Term Goal 2 (Week 1): Pt will be able to sit unsupported with mod A at EOB. OT Short Term Goal 3 (Week 1): Pt will be able to rise to stand in stedy with max A of 1. OT Short Term Goal 4 (Week 1): Pt will bathe UB with set up from wc level.  Skilled Therapeutic Interventions/Progress Updates:  Pt received supine in bed agreeable to OT session. Session focus on BADL retraining and functional mobility. Overall, pt requires MOD A for bed mobility to transition from supine>sitting needing most assist to elevate trunk and scoot hips to EOB with HOB elevated at 64 degrees. Utilized stedy with pt able to sit<>stand with CGA +2 for safety. Pt transported to toilet with total A from stedy where pt able to doff brief in standing with LUE supported on stedy and RUE assisting with clothing mgmt; MOD A for standing balance to complete task. Pt required MAX A to don new brief via sit<>stand in stedy. Pt complete seated oral care at sink with supervision from w/c. Pt left in w/c with alarm belt activated and all needs within reach.   Therapy Documentation Precautions:  Precautions Precautions: Fall Precaution Comments: posterior lean in standing Restrictions Weight Bearing Restrictions: No General:   Vital Signs: Therapy Vitals Temp: 98.2 F (36.8 C) Pulse Rate: 67 BP: 127/77 Patient Position (if appropriate): Lying Oxygen Therapy SpO2: 95 % O2 Device: Room Air Pain: Pt reports no pain during session.   Therapy/Group: Individual Therapy  Angelina Pih 08/22/2019, 8:14 AM

## 2019-08-22 NOTE — Plan of Care (Signed)
  Problem: RH SAFETY Goal: RH STG ADHERE TO SAFETY PRECAUTIONS W/ASSISTANCE/DEVICE Description: STG Adhere to Safety Precautions With cues/reminders Assistance/Device. Outcome: Progressing   Problem: RH COGNITION-NURSING Goal: RH STG USES MEMORY AIDS/STRATEGIES W/ASSIST TO PROBLEM SOLVE Description: STG Uses Memory Aids/Strategies With cues/reminders Assistance to Problem Solve. Outcome: Progressing

## 2019-08-22 NOTE — Progress Notes (Signed)
Speech Language Pathology Daily Session Note  Patient Details  Name: Roger Ramirez MRN: [id number] Date of Birth: December 05, 1945  Today's Date: 08/22/2019 SLP Individual Time: 1300-1356 SLP Individual Time Calculation (min): 56 min  Short Term Goals: Week 1: SLP Short Term Goal 1 (Week 1): Pt will demonstrate use word finding strategies in functional communication exchanges with Supervision A cues. SLP Short Term Goal 2 (Week 1): Pt will demonstrate 75% accuracy in convergent and divergent naming tasks with Min A cues. SLP Short Term Goal 3 (Week 1): Pt will demonstate ability to problem solve during basic and familiar tasks with Max A. SLP Short Term Goal 4 (Week 1): Pt will use external aids to recall daily information with Max A. SLP Short Term Goal 5 (Week 1): Pt will identify 1 cognitive and 1 physical impairment with Max A multimodal cues. SLP Short Term Goal 6 (Week 1): Pt will recall 1 safety precaution with Max A multimodal cues.  Skilled Therapeutic Interventions: Pt was seen for skilled ST targeting cognition. Pt oriented to date and place with Min A for use of external aids, but Max A required for aid to orient to situation as well as to recall this information later in session. Pt continues to only recall call bell function as for TV. Pt did re-create basic to mildly complex PEG board designs from pictures with only Min A for problem solving and error awareness. He self corrected errors X2 and sustained attention without no more than Min A verbal or visual cues for redirection today. In functional conversation regarding safety awareness, Max A verbal cues are required for recall of safety precautions and identifying ADLs he needs to have assistance with, which continues to present safety concern due to lack of awareness and recall. Pt left sitting in wheelchair with alarm set and needs within reach, telesitter engaged. Continue per current plan of care.        Pain Pain Assessment Pain  Scale: 0-10 Pain Score: 0-No pain  Therapy/Group: Individual Therapy  [name] 08/22/2019, 7:22 AM

## 2019-08-22 NOTE — Progress Notes (Signed)
Physical Therapy Session Note  Patient Details  Name: Roger Ramirez MRN: [id number] Date of Birth: [birth year]  Today's Date: 08/22/2019 PT Individual Time: 7622-6333 PT Individual Time Calculation (min): 55 min   Short Term Goals: Week 1:  PT Short Term Goal 1 (Week 1): Pt will perform supine<>sit with mod assist PT Short Term Goal 2 (Week 1): Pt will perform sit<>stand using LRAD with mod assist of 1 PT Short Term Goal 3 (Week 1): Pt will perform bed<>chair transfers with +2 mod assist PT Short Term Goal 4 (Week 1): Pt will ambulate at least 37ft using LRAD with max assist and +2 w/c follow as needed  Skilled Therapeutic Interventions/Progress Updates:    Pt received sitting in w/c and agreeable to therapy session.  Transported to/from gym in w/c for time management and energy conservation. R stand pivot to EOM using RW with heavy max assist due to posterior lean - manual facilitation for upright. During transfer pt reports need to have BM. L squat pivot back to w/c with mod assist for lifting/pivoting hips. Transported back to room. L stand pivot to [facility] over toilet using grab bar with mod assist - max assist for LB clothing management. Pt incontinent of bladder in brief and continent of BM on toilet. Standing with UE support on grab bar and mod progressed to max assist for balance due to posterior  lean while +2 assist performed total assist pericare and LB clothing management. R stand pivot back to w/c using grab bar with mod/max assist due to fatigue.Transported back to gym. R stand pivot to EOM using RW with heavy max assist due to posterior lean. Performed sit<>stands EOM<>RW with max assist to come to stand and repeated cuing for pushing up from mat (did best with L UE pushing from mat and R UE on RW to decrease R posterior lean coming up) but continues to require manual facilitation for increased anterior weight shift. In standing with max assist performed LUE L anterior lateral reaching task  to promote L anterior weight shift due to pt having strong R posterior lean with R knee flexed - manual facilitation and mirror feedback for improved midline orientation and pt demonstrates adequate weight shift but once bringing object back to midline to place on RW pt reverts back to strong R posterior lean. L squat pivot to w/c with mod assist. Transported back to room and left seated in w/c with needs in reach and seat belt alarm on.   Therapy Documentation Precautions:  Precautions Precautions: Fall Precaution Comments: posterior lean in standing Restrictions Weight Bearing Restrictions: No  Pain:  Denies pain during session.   Therapy/Group: Individual Therapy  Ginny Forth, PT, DPT 08/22/2019, 1:32 PM

## 2019-08-22 NOTE — Progress Notes (Signed)
PHYSICAL MEDICINE & REHABILITATION PROGRESS NOTE   Subjective/Complaints:  More talkative today , no pain c/os  ROS: Patient denies CP, SOB, N/V/D     Objective:   No results found. No results for input(s): WBC, HGB, HCT, PLT in the last 72 hours. No results for input(s): NA, K, CL, CO2, GLUCOSE, BUN, CREATININE, CALCIUM in the last 72 hours.  Intake/Output Summary (Last 24 hours) at 08/22/2019 0907 Last data filed at 08/22/2019 0744 Gross per 24 hour  Intake 600 ml  Output -  Net 600 ml     Physical Exam: Vital Signs Blood pressure 127/77, pulse 67, temperature 98.2 F (36.8 C), resp. rate 14, height 5\' 9"  (1.753 m), weight 93.6 kg, SpO2 95 %.    Constitutional: No distress . Vital signs reviewed.  General: No acute distress Mood and affect are appropriate Heart: Regular rate and rhythm no rubs murmurs or extra sounds Lungs: Clear to auscultation, breathing unlabored, no rales or wheezes Abdomen: Positive bowel sounds, soft nontender to palpation, nondistended Extremities: No clubbing, cyanosis, or edema Skin: No evidence of breakdown, no evidence of rash  Tone MAS 2-3 in Right hamstrings  Neurologic: slow to process. Follows commands. Fairly alert after I woke him up. Cranial nerves II through XII intact, motor strength is 5/5 in Bilateral  deltoid, bicep, tricep, grip, 5/5 left and 3/5 Right hip flexor, knee extensors, ankle dorsiflexor and plantar flexor  Musculoskeletal: Full range of motion in all 4 extremities. No joint swelling   Assessment/Plan: 1. Functional deficits secondary to left ACA infarct which require 3+ hours per day of interdisciplinary therapy in a comprehensive inpatient rehab setting.  Physiatrist is providing close team supervision and 24 hour management of active medical problems listed below.  Physiatrist and rehab team continue to assess barriers to discharge/monitor patient progress toward functional and medical goals  Care  Tool:  Bathing  Bathing activity did not occur: Safety/medical concerns Body parts bathed by patient: Right arm, Left arm, Chest, Abdomen, Front perineal area, Right upper leg, Left upper leg, Right lower leg, Left lower leg, Face   Body parts bathed by helper: Buttocks     Bathing assist Assist Level: Maximal Assistance - Patient 24 - 49%     Upper Body Dressing/Undressing Upper body dressing   What is the patient wearing?: Pull over shirt    Upper body assist Assist Level: Supervision/Verbal cueing    Lower Body Dressing/Undressing Lower body dressing      What is the patient wearing?: Incontinence brief, Pants     Lower body assist Assist for lower body dressing: Maximal Assistance - Patient 25 - 49%     Toileting Toileting    Toileting assist Assist for toileting: Total Assistance - Patient < 25%     Transfers Chair/bed transfer  Transfers assist  Chair/bed transfer activity did not occur: Safety/medical concerns  Chair/bed transfer assist level: Maximal Assistance - Patient 25 - 49%     Locomotion Ambulation   Ambulation assist   Ambulation activity did not occur: Safety/medical concerns  Assist level: 2 helpers(mod assist +2 for w/c follow) Assistive device: Walker-rolling Max distance: 29 ft   Walk 10 feet activity   Assist  Walk 10 feet activity did not occur: Safety/medical concerns  Assist level: 2 helpers Assistive device: Walker-rolling   Walk 50 feet activity   Assist Walk 50 feet with 2 turns activity did not occur: Safety/medical concerns         Walk 150 feet  activity   Assist Walk 150 feet activity did not occur: Safety/medical concerns         Walk 10 feet on uneven surface  activity   Assist Walk 10 feet on uneven surfaces activity did not occur: Safety/medical concerns         Wheelchair     Assist Will patient use wheelchair at discharge?: (TBD)             Wheelchair 50 feet with 2 turns  activity    Assist            Wheelchair 150 feet activity     Assist          Blood pressure 127/77, pulse 67, temperature 98.2 F (36.8 C), resp. rate 14, height 5\' 9"  (1.753 m), weight 93.6 kg, SpO2 95 %.  Medical Problem List and Plan: 1.  Right side weakness with dysarthria secondary to left ACA territory acute infarction as well as history of CVA 2020 with cognitive deficits Cognition and RLE weakness are main issues              -patient may shower             -ELOS/Goals: 16-19 days/min/Mod a            CIR PT, OT, SLP, team conf in am  2.  Antithrombotics: -DVT/anticoagulation: Eliquis             -antiplatelet therapy: N/A 3. Pain Management: Tylenol as needed 4. Mood: Melatonin 3 mg nightly             -antipsychotic agents: N/A 5. Neuropsych: This patient is capable of making decisions on his own behalf. 6. Skin/Wound Care: Routine skin checks 7. Fluids/Electrolytes/Nutrition: Routine in and outs. CMP ordered. 8.  History of saddle pulmonary emboli.  Continue Eliquis 9.  Diabetes mellitus.  Hemoglobin A1c 6.1.  SSI.  Patient on Glucophage 1000 mg twice daily prior to admission.  Resume as needed CBG (last 3)  Recent Labs    08/21/19 1653 08/21/19 2131 08/22/19 0612  GLUCAP 118* 136* 122*         controlled 5/11 10.  Hypertension.  Avapro 75 mg daily.           Vitals:   08/21/19 1951 08/22/19 0519  BP: 138/85 127/77  Pulse: 90 67  Resp:    Temp: 98.3 F (36.8 C) 98.2 F (36.8 C)  SpO2: 95% 95%  Labile will monitor  5/11 11.  Hypothyroidism. Continue Synthroid 12.  Hyperlipidemia. Lipitor 13.  History of tobacco abuse.  Counseling    LOS: 6 days A FACE TO [place] E Kirsteins 08/22/2019, 9:07 AM

## 2019-08-23 ENCOUNTER — Inpatient Hospital Stay (HOSPITAL_COMMUNITY): Payer: Medicare Other

## 2019-08-23 ENCOUNTER — Inpatient Hospital Stay (HOSPITAL_COMMUNITY): Payer: Medicare Other | Admitting: Occupational Therapy

## 2019-08-23 LAB — GLUCOSE, CAPILLARY
Glucose-Capillary: 110 mg/dL — ABNORMAL HIGH (ref 70–99)
Glucose-Capillary: 161 mg/dL — ABNORMAL HIGH (ref 70–99)
Glucose-Capillary: 146 mg/dL — ABNORMAL HIGH (ref 70–99)
Glucose-Capillary: 136 mg/dL — ABNORMAL HIGH (ref 70–99)

## 2019-08-23 MED ORDER — METHYLPHENIDATE HCL 5 MG PO TABS
5.0000 mg | ORAL_TABLET | Freq: Two times a day (BID) | ORAL | Status: DC
  Administered 2019-08-23 – 2019-08-25 (×4): 5 mg via ORAL
  Filled 2019-08-23 (×4): qty 1

## 2019-08-23 NOTE — Patient Care Conference (Signed)
Inpatient RehabilitationTeam Conference and Plan of Care Update Date: 08/23/2019   Time: 3:19 PM    Patient Name: Roger Ramirez      Medical Record Number: [id number]  Date of Birth: 12/14/1945 Sex: Male         Room/Bed: 4W21C/4W21C-01 Payor Info: Payor: [place] / Plan: [facility] MEDICARE / Product Type: *No Product type* /    Admit Date/Time:  08/16/2019  3:38 PM  Primary Diagnosis:  Brainstem infarct, acute [facility])  Patient Active Problem List   Diagnosis Date Noted  . Brainstem infarct, acute ([place]) 08/18/2019  . Resolved ischemic cerebrovascular accident (CVA) involving left anterior cerebral artery territory in remote past 08/16/2019  . Benign essential HTN   . Acute ischemic stroke ([place])   . History of CVA (cerebrovascular accident)   . History of pulmonary embolism   . Tobacco abuse   . Essential hypertension   . Obesity (BMI 30.0-34.9) 08/10/2019  . Cerebral infarction ([place])   . CVA (cerebral vascular accident) ([place]) 06/06/2018  . Antiphospholipid syndrome ([place]) 06/06/2018  . Diabetes mellitus type 2, noninsulin dependent ([place]) 06/06/2018  . Hypercholesterolemia with hypertriglyceridemia 01/23/2016  . Impaired fasting glucose 01/23/2016  . B12 deficiency 01/23/2016  . Hypothyroid 01/23/2016  . Cognitive dysfunction 01/23/2016  . Pleuritic chest pain   . Leg DVT (deep venous thromboembolism), acute ([place])   . Near syncope   . Elevated BP   . History of pulmonary embolus (PE)   . Pulmonary embolism ([place]) 12/24/2014    Expected Discharge Date: Expected Discharge Date: 09/08/19  Team Members Present: Physician leading conference: Dr. Alysia Penna Care Coodinator Present: Nestor Lewandowsky, RN, BSN, CRRN;Christina Sampson Goon, BSW Nurse Present: Debroah Loop, RN PT Present: Michaelene Song, PT OT Present: Clyda Greener, OT SLP Present: Jettie Booze, CF-SLP PPS Coordinator present : Ileana Ladd, Burna Mortimer, SLP     Current Status/Progress Goal  Weekly Team Focus  Bowel/Bladder   Patient is incontinent.  To regain continence.  Timed toileting   Swallow/Nutrition/ Hydration   regular/thin, but full supervision due to cognition         ADL's   supervision for UB selfcare with mod assist for LB bathing and max assist for LB dressing.  He needs max instructional cueing to sequence all bathing tasks.  Max assist for transfers squat or stand pivot.  min assist overall  selfcare retraining, balance retraining, transfer training, DME education, cognitive retraining, DME education, pt/family education   Mobility   mod assist bed mobility, mod/max assist sit<>stand using RW, mod assist squat pivot and heavy max assist stand pivot using RW, ambulation up to 88ft using RW with mod assist and +2 w/c follow  supervision bed mobility, CGA transfers, min assist ambulation and stairs  activity tolerance, sitting balance/trunk control, standing balance, functional transfers of sit<>stands and bed<>chair, gait training, B LE strengthening, pt awareness   Communication   Min-Mod A word finding and comprehension to express basic wants and needs  Min A  word finding in functional situations   Safety/Cognition/ Behavioral Observations  Mod-Max A  Min-Mod A  recall with aids, basic functional problem solving, intellectual awareness   Pain   Patient denies pain.  To remain pain free.  To prevent wounds and fallls.   Skin   MASD on groin area.  To keep patient dry and free of MASD  Timed toileting and prompt brief changes.    Rehab Goals Patient on target to meet rehab goals: Yes Rehab Goals Revised: on  target with goals *See Care Plan and progress notes for long and short-term goals.     Barriers to Discharge  Current Status/Progress Possible Resolutions Date Resolved   Nursing        Incontinent  Poor Safety Awareness   Timed Toileting       PT                   OT                  SLP                SW   sw aware of none on target           Discharge Planning/Teaching Needs:  Plans to discharge home  If schedule if recommended   Team Discussion:  Discuss slow initiation, word finding issues. Posterior lean and LOB, requiring max instructional cues for safety. Home alone? Spouse ability to provide assistance and family works; SNF placement a possibility as un-likely not able to return to premorbid functional level  Revisions to Treatment Plan:  Min assist level goals for OT/PT and Moderate assistance goals for SLP    Medical Summary Current Status: Decreased initiation and attention Weekly Focus/Goal: Trial of methylphenidate  Barriers to Discharge: Medical stability;Other (comments)  Barriers to Discharge Comments: We will try medication management of initiation issues Possible Resolutions to Barriers: See above also will need family training if a family member is identified that can provide 24/7 supervision post discharge   Continued Need for Acute Rehabilitation Level of Care: The patient requires daily medical management by a physician with specialized training in physical medicine and rehabilitation for the following reasons: Direction of a multidisciplinary physical rehabilitation program to maximize functional independence : Yes Medical management of patient stability for increased activity during participation in an intensive rehabilitation regime.: Yes Analysis of laboratory values and/or radiology reports with any subsequent need for medication adjustment and/or medical intervention. : Yes   I attest that I was present, lead the team conference, and concur with the assessment and plan of the team.   Chana Bode B 08/23/2019, 3:19 PM

## 2019-08-23 NOTE — Progress Notes (Signed)
Occupational Therapy Session Note  Patient Details  Name: Roger Ramirez MRN: [id number] Date of Birth: [birth year]  Today's Date: 08/23/2019 OT Individual Time: 9379-0240 OT Individual Time Calculation (min): 59 min    Short Term Goals: Week 1:  OT Short Term Goal 1 (Week 1): Pt will be able to sit to EOB with S. OT Short Term Goal 2 (Week 1): Pt will be able to sit unsupported with mod A at EOB. OT Short Term Goal 3 (Week 1): Pt will be able to rise to stand in stedy with max A of 1. OT Short Term Goal 4 (Week 1): Pt will bathe UB with set up from wc level.  Skilled Therapeutic Interventions/Progress Updates:    Pt in bed to start session, needed mod assist for transfer from supine to sit EOB.  He then needed max assist for stand pivot transfer to the wheelchair without an assistive device.  He was able to the work on dressing sit to stand at the sink.  He needed supervision for donning a pullover shirt and then needed max assist for donning pants.  Increased trunk flexion with posterior lean and LOB noted.  Took pt down to the therapy gym and transferred to the therapy mat with mod assist and use of the RW for support.  He then worked on cognitive task in sitting with completion of peg design to match a picture with min instructional cueing with min questioning cueing to note differences in the picture and his design.  Had him work on standing to remove pegs from design with overall mod assist secondary to posterior and right lean as well as trunk flexion.  Worked on trying to have pt complete forward weight shift over his feet while standing as well as trying to achieve left knee extension.  Max demonstrational cueing was needed for keeping the left knee straight.  Finished session with return to the wheelchair at mod assist stand pivot with return to the room. He was left sitting up in the wheelchair with call button and phone in reach and safety belt in place.  Pt oriented to place and situation  this session, but still with decreased sustained attention to tasks, requiring mod instructional cueing to maintain his attention.    Therapy Documentation Precautions:  Precautions Precautions: Fall Precaution Comments: posterior lean in standing Restrictions Weight Bearing Restrictions: No   Pain: Pain Assessment Pain Scale: Faces Pain Score: 0-No pain ADL: See Care Tool Section for some details of mobility and selfcare  Therapy/Group: Individual Therapy  Artesha Wemhoff OTR/L 08/23/2019, 10:32 AM

## 2019-08-23 NOTE — Progress Notes (Signed)
Physical Therapy Session Note  Patient Details  Name: Dellas Guard MRN: [id number] Date of Birth: 18-Sep-1945  Today's Date: 08/23/2019 PT Individual Time: 3151-7616 PT Individual Time Calculation (min): 72 min    Short Term Goals: Week 1:  PT Short Term Goal 1 (Week 1): Pt will perform supine<>sit with mod assist PT Short Term Goal 2 (Week 1): Pt will perform sit<>stand using LRAD with mod assist of 1 PT Short Term Goal 3 (Week 1): Pt will perform bed<>chair transfers with +2 mod assist PT Short Term Goal 4 (Week 1): Pt will ambulate at least 51ft using LRAD with max assist and +2 w/c follow as needed  Skilled Therapeutic Interventions/Progress Updates:    Pt supine in bed upon PT arrival, agreeable to therapy tx and denies pain. Pt's daughter present in the room, discussed d/c planning including home set up and need for 24/7 supervision/assist. Pt reports that he is soiled, that he had a bowel movement in his brief. Pt transferred to sitting EOB with min assist and use of bedrail and cues for techniques. Pt used stedy this session to transfer to toilet - sit<>stand within stedy min assist. Dependent transfer to toilet, total assist for clothing and brief management, incontnent of bowel and then continent episode of bowel and bladder once seated on toilet. Standing balance with min assist and cues to correct R lean while therapist performed pericare total assist. Pt seated on stedy seat at the sink to wash hands with cues to correct R lateral lean. With fatigue noted to lean more to R. Transferred to w/c with stedy. Pt transported to the gym. Pt performed x 2 sit<>stands with RW and in standing working on correction of posterior lean, mod assist with max cues. Pt ambulated x 25 ft with w/c follow mod assist +2 and then ambulated x 22 ft with mod assist (no chair follow) working on turn to sit in w/c, max assist needed for turn to sit following ambulation with lack of awareness and slow initiation.  Pt worked on standing at the high table in the gym while performing anterior reaching activity to facilitate anterior weightshift, x 3 trials with increased R lateral lean and assist needed with fatigue, mod assist. Pt transported back to room and left in w/c with needs in reach and chair alarm set.   Therapy Documentation Precautions:  Precautions Precautions: Fall Precaution Comments: posterior lean in standing Restrictions Weight Bearing Restrictions: No    Therapy/Group: Individual Therapy  Cresenciano Genre, PT, DPT, CSRS 08/23/2019, 12:55 PM

## 2019-08-23 NOTE — Progress Notes (Signed)
Social Work Patient ID: Roger Ramirez, male   DOB: [birth year], 74 y.o.   MRN: [id number]  SW provided SNF and Home care agencies list to family to review over weekend.

## 2019-08-23 NOTE — Progress Notes (Signed)
Speech Language Pathology Daily Session Note  Patient Details  Name: Roger Ramirez MRN: [id number] Date of Birth: [birth year]  Today's Date: 08/23/2019 SLP Individual Time: 1430-1525 SLP Individual Time Calculation (min): 55 min  Short Term Goals: Week 1: SLP Short Term Goal 1 (Week 1): Pt will demonstrate use word finding strategies in functional communication exchanges with Supervision A cues. SLP Short Term Goal 2 (Week 1): Pt will demonstrate 75% accuracy in convergent and divergent naming tasks with Min A cues. SLP Short Term Goal 3 (Week 1): Pt will demonstate ability to problem solve during basic and familiar tasks with Max A. SLP Short Term Goal 4 (Week 1): Pt will use external aids to recall daily information with Max A. SLP Short Term Goal 5 (Week 1): Pt will identify 1 cognitive and 1 physical impairment with Max A multimodal cues. SLP Short Term Goal 6 (Week 1): Pt will recall 1 safety precaution with Max A multimodal cues.  Skilled Therapeutic Interventions: Skilled ST services focused on cognitive skills. SLP facilitated orientation with cues to locate calender for month/day of week and disoriented to date and situation despite cues. Pt demonstrated recall and function use of call bell on remote after SLP wrote " Help" on tape above call button, pt was able locate button with max A verbal cues given several trials. Pt was able to agree on 1 out 4 deficits listed in notebook following education and several trials. Pt continues to demonstrate impairment of insight into deficits and current situation. SLP facilitated basic problem solving , recall and sustained attention sorting cards by color (with various shapes and numbers of shapes on cards), pt required supervision A verbal cues and then sorted by shape pt required mod A fade to min A verbal cues. Pt demonstrated sustained attention in 5 minute intervals. SLP also facilitated basic problem solving skills in novel card task ( Blink), pt  required mod A fade to min A cues to match cards by color and shape, however max A verbal cues to match cards by number. Pt required max A verbal cues to recall of three rules with use of visual aid.Pt was left in room with call bell within reach and chair alarm set. ST recommends to continue skilled ST services.      Pain Pain Assessment Pain Score: 0-No pain  Therapy/Group: Individual Therapy  Doyce Saling  [facility] 08/23/2019, 3:35 PM

## 2019-08-23 NOTE — Progress Notes (Signed)
[place] PHYSICAL MEDICINE & REHABILITATION PROGRESS NOTE   Subjective/Complaints:  Very distractable, poor attention and initiation   ROS: Patient denies CP, SOB, N/V/D     Objective:   No results found. No results for input(s): WBC, HGB, HCT, PLT in the last 72 hours. No results for input(s): NA, K, CL, CO2, GLUCOSE, BUN, CREATININE, CALCIUM in the last 72 hours.  Intake/Output Summary (Last 24 hours) at 08/23/2019 0848 Last data filed at 08/22/2019 1700 Gross per 24 hour  Intake 360 ml  Output --  Net 360 ml     Physical Exam: Vital Signs Blood pressure 131/69, pulse 65, temperature 98.1 F (36.7 C), temperature source Oral, resp. rate 14, height '5\' 9"'$  (1.753 m), weight 93.6 kg, SpO2 97 %.   General: No acute distress Mood and affect are appropriate Heart: Regular rate and rhythm no rubs murmurs or extra sounds Lungs: Clear to auscultation, breathing unlabored, no rales or wheezes Abdomen: Positive bowel sounds, soft nontender to palpation, nondistended Extremities: No clubbing, cyanosis, or edema Skin: No evidence of breakdown, no evidence of rash   Tone MAS 2-3 in Right hamstrings  Neurologic: slow to process. Follows commands. Fairly alert after I woke him up. Cranial nerves II through XII intact, motor strength is 5/5 in Bilateral  deltoid, bicep, tricep, grip, 5/5 left and 3/5 Right hip flexor, knee extensors, ankle dorsiflexor and plantar flexor  Musculoskeletal: Full range of motion in all 4 extremities. No joint swelling   Assessment/Plan: 1. Functional deficits secondary to left ACA infarct which require 3+ hours per day of interdisciplinary therapy in a comprehensive inpatient rehab setting.  Physiatrist is providing close team supervision and 24 hour management of active medical problems listed below.  Physiatrist and rehab team continue to assess barriers to discharge/monitor patient progress toward functional and medical goals  Care Tool:  Bathing   Bathing activity did not occur: Safety/medical concerns Body parts bathed by patient: Right arm, Left arm, Chest, Abdomen, Front perineal area, Right upper leg, Left upper leg, Right lower leg, Left lower leg, Face   Body parts bathed by helper: Buttocks     Bathing assist Assist Level: Maximal Assistance - Patient 24 - 49%     Upper Body Dressing/Undressing Upper body dressing   What is the patient wearing?: Pull over shirt    Upper body assist Assist Level: Supervision/Verbal cueing    Lower Body Dressing/Undressing Lower body dressing      What is the patient wearing?: Incontinence brief, Pants     Lower body assist Assist for lower body dressing: Maximal Assistance - Patient 25 - 49%     Toileting Toileting    Toileting assist Assist for toileting: Maximal Assistance - Patient 25 - 49%     Transfers Chair/bed transfer  Transfers assist  Chair/bed transfer activity did not occur: Safety/medical concerns  Chair/bed transfer assist level: Maximal Assistance - Patient 25 - 49%     Locomotion Ambulation   Ambulation assist   Ambulation activity did not occur: Safety/medical concerns  Assist level: 2 helpers(mod assist +2 for w/c follow) Assistive device: Walker-rolling Max distance: 29 ft   Walk 10 feet activity   Assist  Walk 10 feet activity did not occur: Safety/medical concerns  Assist level: 2 helpers Assistive device: Walker-rolling   Walk 50 feet activity   Assist Walk 50 feet with 2 turns activity did not occur: Safety/medical concerns         Walk 150 feet activity   Assist  Walk 150 feet activity did not occur: Safety/medical concerns         Walk 10 feet on uneven surface  activity   Assist Walk 10 feet on uneven surfaces activity did not occur: Safety/medical concerns         Wheelchair     Assist Will patient use wheelchair at discharge?: (TBD)             Wheelchair 50 feet with 2 turns  activity    Assist            Wheelchair 150 feet activity     Assist          Blood pressure 131/69, pulse 65, temperature 98.1 F (36.7 C), temperature source Oral, resp. rate 14, height _0  (1.753 m), weight 93.6 kg, SpO2 97 %.  Medical Problem List and Plan: 1.  Right side weakness with dysarthria secondary to left ACA territory acute infarction as well as history of CVA 2020 with cognitive deficits Cognition and RLE weakness are main issues              -patient may shower             -ELOS/Goals: 16-19 days/min/Mod a     Team conference today please see physician documentation under team conference tab, met with team  to discuss problems,progress, and goals. Formulized individual treatment plan based on medical history, underlying problem and comorbidities. 2.  Antithrombotics: -DVT/anticoagulation: Eliquis             -antiplatelet therapy: N/A 3. Pain Management: Tylenol as needed 4. Mood: Melatonin 3 mg nightly             -antipsychotic agents: N/A 5. Neuropsych: This patient is capable of making decisions on his own behalf. Poor concentration attention, initiation , as discussed with pt , methylphenidate trial 6. Skin/Wound Care: Routine skin checks 7. Fluids/Electrolytes/Nutrition: Routine in and outs. CMP ordered. 8.  History of saddle pulmonary emboli.  Continue Eliquis 9.  Diabetes mellitus.  Hemoglobin A1c 6.1.  SSI.  Patient on Glucophage 1000 mg twice daily prior to admission.  Resume as needed CBG (last 3)  Recent Labs    08/22/19 1656 08/22/19 2110 08/23/19 0610  GLUCAP 131* 130* 110*         controlled 5/12 10.  Hypertension.  Avapro 75 mg daily.           Vitals:   08/22/19 1951 08/23/19 0329  BP: (!) 142/78 131/69  Pulse: 84 65  Resp:    Temp: 98.3 F (36.8 C) 98.1 F (36.7 C)  SpO2: 92% 97%  Labile will monitor  5/12 11.  Hypothyroidism. Continue Synthroid 12.  Hyperlipidemia. Lipitor 13.  History of tobacco abuse.   Counseling    LOS: 7 days A FACE TO FACE EVALUATION WAS PERFORMED  Charlett Blake 08/23/2019, 8:48 AM

## 2019-08-23 NOTE — Plan of Care (Signed)
  Problem: RH SAFETY Goal: RH STG ADHERE TO SAFETY PRECAUTIONS W/ASSISTANCE/DEVICE Description: STG Adhere to Safety Precautions With cues/reminders Assistance/Device. Outcome: Progressing   

## 2019-08-23 NOTE — Progress Notes (Signed)
Team Conference Report to Patient/Family  Team Conference discussion was reviewed with the patient and caregiver, including goals, any changes in plan of care and target discharge date.  Patient and caregiver express understanding and are in agreement.  The patient has a target discharge date of  09/07/19. SW will provide family with private Home care and SNF options to begin researching. SW will give family until next week to make a decision.  Roger Ramirez 08/23/2019, 3:01 PM

## 2019-08-24 ENCOUNTER — Inpatient Hospital Stay (HOSPITAL_COMMUNITY): Payer: Medicare Other | Admitting: Physical Therapy

## 2019-08-24 ENCOUNTER — Inpatient Hospital Stay (HOSPITAL_COMMUNITY): Payer: Medicare Other | Admitting: Occupational Therapy

## 2019-08-24 ENCOUNTER — Inpatient Hospital Stay (HOSPITAL_COMMUNITY): Payer: Medicare Other | Admitting: Speech Pathology

## 2019-08-24 LAB — GLUCOSE, CAPILLARY
Glucose-Capillary: 94 mg/dL (ref 70–99)
Glucose-Capillary: 143 mg/dL — ABNORMAL HIGH (ref 70–99)
Glucose-Capillary: 173 mg/dL — ABNORMAL HIGH (ref 70–99)
Glucose-Capillary: 92 mg/dL (ref 70–99)

## 2019-08-24 NOTE — Progress Notes (Signed)
Physical Therapy Weekly Progress Note  Patient Details  Name: Roger Ramirez MRN: [id number] Date of Birth: 12/10/1945  Beginning of progress report period: Aug 17, 2019 End of progress report period: Aug 24, 2019  Today's Date: 08/24/2019 PT Individual Time: 1425-1535 PT Individual Time Calculation (min): 70 min   Patient has met 4 of 4 short term goals.  Mr. Epple is making slow, steady progress with therapy; however, continues to be significantly limited by notable cognitive impairments resulting in poor awareness, decreased attention, delayed processing, decreased initiation, impaired motor planning, and poor midline awareness. He continues to demonstrate poor trunk control that is more prominent with fatigue resulting in R posterior lean requiring up to max assist to return to midline. He is performing supine<>sit with mod assist, sit<>stand with mod assist (occasional max assist with fatigue), squat pivot transfers with mod assist, stand pivot transfers with heavy max assist due to posterior lean, and progressed to ambulating up to 21f using RW with mod assist of 1 and +2 w/c follow.   Patient continues to demonstrate the following deficits muscle weakness and muscle joint tightness, decreased cardiorespiratoy endurance, impaired timing and sequencing, abnormal tone, unbalanced muscle activation, motor apraxia, decreased coordination and decreased motor planning, decreased midline orientation and decreased attention to right, decreased initiation, decreased attention, decreased awareness, decreased problem solving, decreased safety awareness, decreased memory and delayed processing and decreased sitting balance, decreased standing balance, decreased postural control and decreased balance strategies and therefore will continue to benefit from skilled PT intervention to increase functional independence with mobility.  Patient progressing toward long term goals..  Continue plan of care.  PT Short  Term Goals Week 1:  PT Short Term Goal 1 (Week 1): Pt will perform supine<>sit with mod assist PT Short Term Goal 1 - Progress (Week 1): Met PT Short Term Goal 2 (Week 1): Pt will perform sit<>stand using LRAD with mod assist of 1 PT Short Term Goal 2 - Progress (Week 1): Met PT Short Term Goal 3 (Week 1): Pt will perform bed<>chair transfers with +2 mod assist PT Short Term Goal 3 - Progress (Week 1): Met PT Short Term Goal 4 (Week 1): Pt will ambulate at least 263fusing LRAD with max assist and +2 w/c follow as needed PT Short Term Goal 4 - Progress (Week 1): Met Week 2:  PT Short Term Goal 1 (Week 2): Pt will perform supine<>sit with min assist PT Short Term Goal 2 (Week 2): Pt will perform sit<>stand with min assist PT Short Term Goal 3 (Week 2): Pt will perform bed<>chair transfer with min assist PT Short Term Goal 4 (Week 2): Pt will ambulate at least 5072fsing LRAD with mod assist of 1  Skilled Therapeutic Interventions/Progress Updates:  Ambulation/gait training;Community reintegration;DME/adaptive equipment instruction;Neuromuscular re-education;Psychosocial support;Stair training;UE/LE Strength taining/ROM;Wheelchair propulsion/positioning;Balance/vestibular training;Discharge planning;Functional electrical stimulation;Pain management;Skin care/wound management;Therapeutic Activities;UE/LE Coordination activities;Cognitive remediation/compensation;Disease management/prevention;Functional mobility training;Patient/family education;Splinting/orthotics;Therapeutic Exercise;Visual/perceptual remediation/compensation   Pt received supine in bed and agreeable to therapy session. Supine>sitting R EOB, HOB slightly elevated and relying heavily on bedrails, with mod assist for trunk upright and pivoting hips to EOB due to strong R posterior lean. R squat pivot to w/c with mod assist for maintaining anterior trunk flexion and lifting/pivoting hips.  Transported to/from gym in w/c for time  management and energy conservation. Attempted sit>stand w/c>RW to initiate stand pivot transfer but pt with strong posterior lean therefore transitioned to squat pivot with mod assist for facilitating anterior trunk lean and lifting/pivoting hips to EOM.  Sit<>stands with reaching tasks via pt reaching forward to grasp horseshoe from floor then standing to place on basketball goal targeting anterior trunk lean/weight shift x8 reps - max cuing for sequencing and UE placement to decrease R posterior lean (continue to use R UE on RW and pushing from mat with L UE to decrease R lean). Gait training ~27f-25ft x3 trials using RW with mod assist due to strong R posterior lean and +2 w/c follow - demonstrates poor attention to task, decreased B LE step lengths (R more impaired than L), intermittent R LE scissoring, and decreased gait speed - cuing for improvement throughout. Pt reporting increasing fatigue. Transitioned back to performing sit<>stand task of standing to grasp horseshoe from basketball goal then bending forward to place on bench while he sat - pt demonstrates posterior lean falling back towards mat after grasping horseshoe despite manual facilitation for increased trunk/hip flexion with anterior weight shift on descent. R squat pivot to w/c with max assist due to fatigue and worsening posterior lean. Transported back to room and pt requesting to remain in w/c - left with needs in reach, seat belt alarm on, and telesitter in place. PT documented in pt's memory notebook.   Therapy Documentation Precautions:  Precautions Precautions: Fall Precaution Comments: posterior lean in standing Restrictions Weight Bearing Restrictions: No  Pain:   Denies pain during session.  Therapy/Group: Individual Therapy  CTawana Scale PT, DPT 08/24/2019, 1:00 PM

## 2019-08-24 NOTE — Plan of Care (Signed)
  Problem: RH KNOWLEDGE DEFICIT Goal: RH STG INCREASE KNOWLEDGE OF HYPERTENSION Description: Pt/family able to demonstrate understanding of HTN management using diet control and medication compliance with booklets/handouts mod I assist prior to DC.  Outcome: Not Progressing Note: Moderate assistaance; dementia

## 2019-08-24 NOTE — Progress Notes (Signed)
PHYSICAL MEDICINE & REHABILITATION PROGRESS NOTE   Subjective/Complaints:  Very distractable, poor attention and initiation, patient remembers me.  He has done some physical therapy this morning.  ROS: Patient denies CP, SOB, N/V/D     Objective:   No results found. No results for input(s): WBC, HGB, HCT, PLT in the last 72 hours. No results for input(s): NA, K, CL, CO2, GLUCOSE, BUN, CREATININE, CALCIUM in the last 72 hours.  Intake/Output Summary (Last 24 hours) at 08/24/2019 1633 Last data filed at 08/24/2019 1024 Gross per 24 hour  Intake 220 ml  Output --  Net 220 ml     Physical Exam: Vital Signs Blood pressure 126/70, pulse 72, temperature 98.3 F (36.8 C), resp. rate 20, height 5\' 9"  (1.753 m), weight 93.6 kg, SpO2 99 %.  General: No acute distress Mood and affect are appropriate Heart: Regular rate and rhythm no rubs murmurs or extra sounds Lungs: Clear to auscultation, breathing unlabored, no rales or wheezes Abdomen: Positive bowel sounds, soft nontender to palpation, nondistended Extremities: No clubbing, cyanosis, or edema Skin: No evidence of breakdown, no evidence of rash  Tone MAS 2-3 in Right hamstrings  Neurologic: slow to process. Follows commands. Fairly alert after I woke him up. Cranial nerves II through XII intact, motor strength is 5/5 in Bilateral  deltoid, bicep, tricep, grip, 5/5 left and 3/5 Right hip flexor, knee extensors, ankle dorsiflexor and plantar flexor  Musculoskeletal: Full range of motion in all 4 extremities. No joint swelling   Assessment/Plan: 1. Functional deficits secondary to left ACA infarct which require 3+ hours per day of interdisciplinary therapy in a comprehensive inpatient rehab setting.  Physiatrist is providing close team supervision and 24 hour management of active medical problems listed below.  Physiatrist and rehab team continue to assess barriers to discharge/monitor patient progress toward functional  and medical goals  Care Tool:  Bathing  Bathing activity did not occur: Safety/medical concerns Body parts bathed by patient: Right arm, Left arm, Chest, Abdomen, Front perineal area, Right upper leg, Left upper leg, Right lower leg, Left lower leg, Face   Body parts bathed by helper: Buttocks     Bathing assist Assist Level: Moderate Assistance - Patient 50 - 74%     Upper Body Dressing/Undressing Upper body dressing   What is the patient wearing?: Pull over shirt    Upper body assist Assist Level: Supervision/Verbal cueing    Lower Body Dressing/Undressing Lower body dressing      What is the patient wearing?: Pants, Incontinence brief     Lower body assist Assist for lower body dressing: Moderate Assistance - Patient 50 - 74%     Toileting Toileting    Toileting assist Assist for toileting: Maximal Assistance - Patient 25 - 49%     Transfers Chair/bed transfer  Transfers assist  Chair/bed transfer activity did not occur: Safety/medical concerns  Chair/bed transfer assist level: Moderate Assistance - Patient 50 - 74%     Locomotion Ambulation   Ambulation assist   Ambulation activity did not occur: Safety/medical concerns  Assist level: 2 helpers(mod/max A 1 and +2 w/c follow) Assistive device: Walker-rolling Max distance: 70ft   Walk 10 feet activity   Assist  Walk 10 feet activity did not occur: Safety/medical concerns  Assist level: 2 helpers Assistive device: Walker-rolling   Walk 50 feet activity   Assist Walk 50 feet with 2 turns activity did not occur: Safety/medical concerns         Walk  150 feet activity   Assist Walk 150 feet activity did not occur: Safety/medical concerns         Walk 10 feet on uneven surface  activity   Assist Walk 10 feet on uneven surfaces activity did not occur: Safety/medical concerns         Wheelchair     Assist Will patient use wheelchair at discharge?: (TBD)              Wheelchair 50 feet with 2 turns activity    Assist            Wheelchair 150 feet activity     Assist          Blood pressure 126/70, pulse 72, temperature 98.3 F (36.8 C), resp. rate 20, height 5\' 9"  (1.753 m), weight 93.6 kg, SpO2 99 %.  Medical Problem List and Plan: 1.  Right side weakness with dysarthria secondary to left ACA territory acute infarction as well as history of CVA 2020 with cognitive deficits Cognition and RLE weakness are main issues              -patient may shower             -ELOS/Goals: 16-19 days/min/Mod a      2.  Antithrombotics: -DVT/anticoagulation: Eliquis             -antiplatelet therapy: N/A 3. Pain Management: Tylenol as needed 4. Mood: Melatonin 3 mg nightly             -antipsychotic agents: N/A 5. Neuropsych: This patient is capable of making decisions on his own behalf. Poor concentration attention, initiation , as discussed with pt , methylphenidate trial 6. Skin/Wound Care: Routine skin checks 7. Fluids/Electrolytes/Nutrition: Routine in and outs. CMP ordered. 8.  History of saddle pulmonary emboli.  Continue Eliquis 9.  Diabetes mellitus.  Hemoglobin A1c 6.1.  SSI.  Patient on Glucophage 1000 mg twice daily prior to admission.  Resume as needed CBG (last 3)  Recent Labs    08/23/19 2050 08/24/19 0601 08/24/19 1144  GLUCAP 136* 94 143*         controlled 5/13 10.  Hypertension.  Avapro 75 mg daily.           Vitals:   08/24/19 0418 08/24/19 1336  BP: (!) 145/64 126/70  Pulse: (!) 58 72  Resp:  20  Temp: 98.3 F (36.8 C) 98.3 F (36.8 C)  SpO2: 97% 99%  Labile will monitor  5/13 11.  Hypothyroidism. Continue Synthroid 12.  Hyperlipidemia. Lipitor 13.  History of tobacco abuse.  Counseling    LOS: 8 days A FACE TO [place] E Tyera Hansley 08/24/2019, 4:33 PM

## 2019-08-24 NOTE — Progress Notes (Signed)
Patient ID: Roger Ramirez, male   DOB: April 26, 1945, 74 y.o.   MRN: [id number]  Sw followed up with daughter to see if there were any questions about the list of SNF and [facility] options. Daughter and son are reviewing and will collaborate. SW will follow up with family on Monday.

## 2019-08-24 NOTE — Plan of Care (Signed)
  Problem: RH SAFETY Goal: RH STG ADHERE TO SAFETY PRECAUTIONS W/ASSISTANCE/DEVICE Description: STG Adhere to Safety Precautions With cues/reminders Assistance/Device. 08/24/2019 1009 by Pierre Bali, RN Outcome: Not Progressing; telesitter Flowsheets (Taken 08/24/2019 1009) STG:Pt will adhere to safety precautions with assistance/device: 3-Moderate assistance 08/24/2019 0957 by Pierre Bali, RN Outcome: Progressing Flowsheets (Taken 08/24/2019 0957) STG:Pt will adhere to safety precautions with assistance/device: 3-Moderate assistance

## 2019-08-24 NOTE — Progress Notes (Signed)
Speech Language Pathology Weekly Progress and Session Note  Patient Details  Name: Roger Ramirez MRN: [id number] Date of Birth: 02-Oct-1945  Beginning of progress report period: Aug 17, 2019 End of progress report period: Aug 24, 2019  Today's Date: 08/24/2019 SLP Individual Time: 1005-1045 SLP Individual Time Calculation (min): 40 min  Short Term Goals: Week 1: SLP Short Term Goal 1 (Week 1): Pt will demonstrate use word finding strategies in functional communication exchanges with Supervision A cues. SLP Short Term Goal 1 - Progress (Week 1): Met SLP Short Term Goal 2 (Week 1): Pt will demonstrate 75% accuracy in convergent and divergent naming tasks with Min A cues. SLP Short Term Goal 2 - Progress (Week 1): Met SLP Short Term Goal 3 (Week 1): Pt will demonstate ability to problem solve during basic and familiar tasks with Max A. SLP Short Term Goal 3 - Progress (Week 1): Met SLP Short Term Goal 4 (Week 1): Pt will use external aids to recall daily information with Max A. SLP Short Term Goal 4 - Progress (Week 1): Met SLP Short Term Goal 5 (Week 1): Pt will identify 1 cognitive and 1 physical impairment with Max A multimodal cues. SLP Short Term Goal 5 - Progress (Week 1): Met SLP Short Term Goal 6 (Week 1): Pt will recall 1 safety precaution with Max A multimodal cues. SLP Short Term Goal 6 - Progress (Week 1): Met    New Short Term Goals: Week 2: SLP Short Term Goal 1 (Week 2): Pt will recall 2 safety precaution with Max A multimodal cues. SLP Short Term Goal 2 (Week 2): Pt will identify 2 cognitive and 2 physical impairment with Max A multimodal cues. SLP Short Term Goal 3 (Week 2): Pt will use external aids to recall daily information with Mod A. SLP Short Term Goal 4 (Week 2): Pt will demonstate ability to problem solve during basic and familiar tasks with Mod A. SLP Short Term Goal 5 (Week 2): Pt will demonstrate use word finding strategies in functional communication exchanges  with Mod I.  Weekly Progress Updates: Patient has made slow but functional gains and has met 6 of 6 STGs this reporting period. Currently, patient requires overall Max A multimodal cues to complete functional and familiar tasks safely in regards to recall with use of strategies, intellectual awareness and basic problem solving. Patient demonstrates overall improved verbal expression and can make his wants/needs known with overall supervision level verbal cues for word-finding. Patient and family education is ongoing. Patient would benefit from continued skilled SLP intervention to maximize his cognitive-linguistic functioning and overall functional independence prior to discharge.      Intensity: Minumum of 1-2 x/day, 30 to 90 minutes Frequency: 3 to 5 out of 7 days Duration/Length of Stay: 09/07/19 Treatment/Interventions: Cueing hierarchy;Functional tasks;Patient/family education;Therapeutic Activities;Internal/external aids;Cognitive remediation/compensation;Speech/Language facilitation;Environmental controls   Daily Session  Skilled Therapeutic Interventions: Skilled treatment session focused on cognitive goals. SLP facilitated session by providing Min A verbal cues for use of external aids for orientation to date, however, patent was independently oriented to place and situation. Total A multimodal cues were needed for use of external aids to recall goals of skilled intervention and for recall of events from previous therapy session. Patient sorted coins from a field of 4 independently but required Min-Mod A verbal cues for basic problem solving while generating specific amounts of change. Patient left upright in the wheelchair with alarm on and all needs within reach. Continue with current plan of care.  Pain No/Denies Pain   Therapy/Group: Individual Therapy  [place]cott Truex 08/24/2019, 6:43 AM

## 2019-08-24 NOTE — Progress Notes (Signed)
Occupational Therapy Weekly Progress Note  Patient Details  Name: Roger Ramirez MRN: [id number] Date of Birth: 06/17/1945  Beginning of progress report period: Aug 16, 2019 End of progress report period: Aug 24, 2019  Today's Date: 08/24/2019 OT Individual Time: 2202-5427 OT Individual Time Calculation (min): 77 min    Patient has met 4 of 4 short term goals.  Pt is making steady progress with OT at this time.  He is able to complete UB selfcare with overall supervision in sitting, with LB bathing and dressing at an overall mod assist.  He continues to need max step by step cueing to sequence through bathing tasks with max questioning cueing for sequencing through dressing tasks.  He continues to need mod to max assist for stand pivot transfers to the [facility] or the wheelchair with increased posterior lean as well as right lean.  He demonstrates decreased ability to maintain right knee extension as well in standing.  Roger Ramirez needs mod instructional cueing for orientation to reason for hospitalization and demonstrates decreased awareness of his deficits with regards to his current need for assist with balance and selfcare tasks.  Feel he is on target to meet min assist goals, but will need family to arrange 24 hour supervision at discharge.  If this is not available, he will need SNF level rehab for follow-up.  Recommend continued CIR level OT at this time.    Patient continues to demonstrate the following deficits: muscle weakness, impaired timing and sequencing, unbalanced muscle activation and decreased coordination, decreased initiation, decreased attention, decreased awareness, decreased problem solving, decreased safety awareness and decreased memory and decreased sitting balance, decreased standing balance, decreased postural control and decreased balance strategies and therefore will continue to benefit from skilled OT intervention to enhance overall performance with BADL and Reduce care partner  burden.  Patient progressing toward long term goals..  Continue plan of care.  OT Short Term Goals Week 2:  OT Short Term Goal 1 (Week 2): Pt will complete LB bathing sit to stand with min assist. OT Short Term Goal 2 (Week 2): Pt will complete LB dressing sit to stand with min assist. OT Short Term Goal 3 (Week 2): Pt will complete toilet transfers with min assist stand pivot to the 3:1. OT Short Term Goal 4 (Week 2): Pt will demonstrate intellectual awareness of physical deficits with no more than min questioning cueing.  Skilled Therapeutic Interventions/Progress Updates:    Pt began with transfer from supine to sit with mod assist and mod instructional cueing to sequence.  Once sitting, he needed initial min assist for balance, until he was cued to scoot forward to the edge of the bed, where he was then able to sit with supervision.  Therapist utilized [place] for transfer to the 3:1 over the toilet with max assist needed for clothing management in standing in the [place].  He was able to have a small BM with mod assist for toilet hygiene in standing with the walker.  He then transferred stand pivot to the wheelchair with mod assist as well.  He was then able to work on bathing, dressing, and grooming tasks at the sink.  Max instructional cueing to sequence through bathing with pt exhibiting one episode of bladder incontinence in standing.   Mod assist needed for standing during LB selfcare tasks secondary to posterior lean with inability to correct.  He was able to complete washing his UB with supervision as well as donning his pullover shirt.  He needed  mod assist for threading his brief and pants as well as maintaining his balance for pulling them over his hips.  Max questioning cueing was also needed for him to sequence through what to complete next with regards to dressing tasks.  He demonstrated decreased intellectual awareness of his balance deficits as he stated there was no difference now  compared to when he was at home by himself.  Finished session with standing for brushing his teeth with overall mod assist.  Pt left in the wheelchair with nursing present and call button and phone in reach.  Safety alarm belt in place as well.      Therapy Documentation Precautions:  Precautions Precautions: Fall Precaution Comments: posterior lean in standing Restrictions Weight Bearing Restrictions: No   Pain:  No report of pain during session  ADL: See Care Tool Section for some details of mobility and selfcare  Therapy/Group: Individual Therapy  Roger Ramirez OTR/L 08/24/2019, 3:59 PM

## 2019-08-25 ENCOUNTER — Inpatient Hospital Stay (HOSPITAL_COMMUNITY): Payer: Medicare Other | Admitting: Speech Pathology

## 2019-08-25 ENCOUNTER — Inpatient Hospital Stay (HOSPITAL_COMMUNITY): Payer: Medicare Other | Admitting: Physical Therapy

## 2019-08-25 ENCOUNTER — Inpatient Hospital Stay (HOSPITAL_COMMUNITY): Payer: Medicare Other | Admitting: Occupational Therapy

## 2019-08-25 LAB — GLUCOSE, CAPILLARY
Glucose-Capillary: 107 mg/dL — ABNORMAL HIGH (ref 70–99)
Glucose-Capillary: 177 mg/dL — ABNORMAL HIGH (ref 70–99)
Glucose-Capillary: 117 mg/dL — ABNORMAL HIGH (ref 70–99)
Glucose-Capillary: 146 mg/dL — ABNORMAL HIGH (ref 70–99)

## 2019-08-25 MED ORDER — METHYLPHENIDATE HCL 5 MG PO TABS
10.0000 mg | ORAL_TABLET | Freq: Two times a day (BID) | ORAL | Status: DC
  Administered 2019-08-25 – 2019-08-30 (×9): 10 mg via ORAL
  Filled 2019-08-25 (×9): qty 2

## 2019-08-25 NOTE — Progress Notes (Signed)
[place] PHYSICAL MEDICINE & REHABILITATION PROGRESS NOTE   Subjective/Complaints:  Now on methylphenidate , sleeping this am rouses to physical stim   ROS: Patient denies CP, SOB, N/V/D     Objective:   No results found. No results for input(s): WBC, HGB, HCT, PLT in the last 72 hours. No results for input(s): NA, K, CL, CO2, GLUCOSE, BUN, CREATININE, CALCIUM in the last 72 hours.  Intake/Output Summary (Last 24 hours) at 08/25/2019 0800 Last data filed at 08/24/2019 1841 Gross per 24 hour  Intake 750 ml  Output --  Net 750 ml     Physical Exam: Vital Signs Blood pressure (!) 158/73, pulse (!) 57, temperature 97.6 F (36.4 C), resp. rate 16, height 5\' 9"  (1.753 m), weight 87.4 kg, SpO2 96 %.   General: No acute distress Mood and affect are appropriate Heart: Regular rate and rhythm no rubs murmurs or extra sounds Lungs: Clear to auscultation, breathing unlabored, no rales or wheezes Abdomen: Positive bowel sounds, soft nontender to palpation, nondistended Extremities: No clubbing, cyanosis, or edema Skin: No evidence of breakdown, no evidence of rash   Tone MAS 2-3 in Right hamstrings  Neurologic: slow to process.no lability or agitation  Follows simple commands. Fairly alert after I woke him up. Cranial nerves II through XII intact, motor strength is 5/5 in Bilateral  deltoid, bicep, tricep, grip, 5/5 left and 3/5 Right hip flexor, knee extensors, ankle dorsiflexor and plantar flexor  Musculoskeletal: Full range of motion in all 4 extremities. No joint swelling   Assessment/Plan: 1. Functional deficits secondary to left ACA infarct which require 3+ hours per day of interdisciplinary therapy in a comprehensive inpatient rehab setting.  Physiatrist is providing close team supervision and 24 hour management of active medical problems listed below.  Physiatrist and rehab team continue to assess barriers to discharge/monitor patient progress toward functional and medical  goals  Care Tool:  Bathing  Bathing activity did not occur: Safety/medical concerns Body parts bathed by patient: Right arm, Left arm, Chest, Abdomen, Front perineal area, Right upper leg, Left upper leg, Right lower leg, Left lower leg, Face   Body parts bathed by helper: Buttocks     Bathing assist Assist Level: Moderate Assistance - Patient 50 - 74%     Upper Body Dressing/Undressing Upper body dressing   What is the patient wearing?: Pull over shirt    Upper body assist Assist Level: Supervision/Verbal cueing    Lower Body Dressing/Undressing Lower body dressing      What is the patient wearing?: Pants, Incontinence brief     Lower body assist Assist for lower body dressing: Moderate Assistance - Patient 50 - 74%     Toileting Toileting    Toileting assist Assist for toileting: Maximal Assistance - Patient 25 - 49%     Transfers Chair/bed transfer  Transfers assist  Chair/bed transfer activity did not occur: Safety/medical concerns  Chair/bed transfer assist level: Moderate Assistance - Patient 50 - 74%     Locomotion Ambulation   Ambulation assist   Ambulation activity did not occur: Safety/medical concerns  Assist level: 2 helpers(mod/max A 1 and +2 w/c follow) Assistive device: Walker-rolling Max distance: 6ft   Walk 10 feet activity   Assist  Walk 10 feet activity did not occur: Safety/medical concerns  Assist level: 2 helpers Assistive device: Walker-rolling   Walk 50 feet activity   Assist Walk 50 feet with 2 turns activity did not occur: Safety/medical concerns  Walk 150 feet activity   Assist Walk 150 feet activity did not occur: Safety/medical concerns         Walk 10 feet on uneven surface  activity   Assist Walk 10 feet on uneven surfaces activity did not occur: Safety/medical concerns         Wheelchair     Assist Will patient use wheelchair at discharge?: (TBD)             Wheelchair 50  feet with 2 turns activity    Assist            Wheelchair 150 feet activity     Assist          Blood pressure (!) 158/73, pulse (!) 57, temperature 97.6 F (36.4 C), resp. rate 16, height 5\' 9"  (1.753 m), weight 87.4 kg, SpO2 96 %.  Medical Problem List and Plan: 1.  Right side weakness with dysarthria secondary to left ACA territory acute infarction as well as history of CVA 2020 with cognitive deficits Cognition and RLE weakness are main issues              -patient may shower             -ELOS/Goals: 16-19 days/min/Mod a      2.  Antithrombotics: -DVT/anticoagulation: Eliquis             -antiplatelet therapy: N/A 3. Pain Management: Tylenol as needed 4. Mood: Melatonin 3 mg nightly             -antipsychotic agents: N/A 5. Neuropsych: This patient is capable of making decisions on his own behalf. Poor concentration attention, initiation , as discussed with pt , methylphenidate trial- increase to 10mg   6. Skin/Wound Care: Routine skin checks 7. Fluids/Electrolytes/Nutrition: Routine in and outs. CMP ordered. 8.  History of saddle pulmonary emboli.  Continue Eliquis 9.  Diabetes mellitus.  Hemoglobin A1c 6.1.  SSI.  Patient on Glucophage 1000 mg twice daily prior to admission.  Resume as needed CBG (last 3)  Recent Labs    08/24/19 1655 08/24/19 2124 08/25/19 0558  GLUCAP 173* 92 107*         controlled 5/14 10.  Hypertension.  Avapro 75 mg daily.           Vitals:   08/24/19 2001 08/25/19 0355  BP: 137/71 (!) 158/73  Pulse: 70 (!) 57  Resp: 16 16  Temp: 98.4 F (36.9 C) 97.6 F (36.4 C)  SpO2: 97% 96%  Labile will monitor  5/14 11.  Hypothyroidism. Continue Synthroid 12.  Hyperlipidemia. Lipitor 13.  History of tobacco abuse.  Counseling    LOS: 9 days A FACE TO FACE EVALUATION WAS PERFORMED  Charlett Blake 08/25/2019, 8:00 AM

## 2019-08-25 NOTE — Progress Notes (Signed)
Speech Language Pathology Daily Session Note  Patient Details  Name: Roger Ramirez MRN: [id number] Date of Birth: Feb 27, 1946  Today's Date: 08/25/2019 SLP Individual Time: 7858-8502 SLP Individual Time Calculation (min): 45 min  Short Term Goals: Week 2: SLP Short Term Goal 1 (Week 2): Pt will recall 2 safety precaution with Max A multimodal cues. SLP Short Term Goal 2 (Week 2): Pt will identify 2 cognitive and 2 physical impairment with Max A multimodal cues. SLP Short Term Goal 3 (Week 2): Pt will use external aids to recall daily information with Mod A. SLP Short Term Goal 4 (Week 2): Pt will demonstate ability to problem solve during basic and familiar tasks with Mod A. SLP Short Term Goal 5 (Week 2): Pt will demonstrate use word finding strategies in functional communication exchanges with Mod I.  Skilled Therapeutic Interventions:  Pt was seen for skilled ST targeting cognitive goals.  Pt was in bed upon arrival and had not yet eaten breakfast so therapist brought him his meal tray.  Pt was able to set up his meal tray and feed himself with mod I for increased time.  Pt did at first appear distracted by the therapist's presence but when therapist positioned herself out of pt's line of sight he fully attended to his meal without difficulty.  Pt even had the TV on with low volume and watched a show while he ate breakfast.  After completion of meal, pt was able to use his memory notebook to recall ~50% of details from yesterday's therapy sessions with mod-max assist multimodal cues.  Pt was left in bed with bed alarm set and call bell within reach.  Continue per current plan of care.    Pain Pain Assessment Pain Scale: 0-10 Pain Score: 0-No pain  Therapy/Group: Individual Therapy  Bridney Guadarrama, Melanee Spry 08/25/2019, 12:14 PM

## 2019-08-25 NOTE — Progress Notes (Signed)
Occupational Therapy Session Note  Patient Details  Name: Roger Ramirez MRN: [id number] Date of Birth: [birth year]  Today's Date: 08/25/2019 OT Individual Time: 1304-1405 OT Individual Time Calculation (min): 61 min    Short Term Goals: Week 2:  OT Short Term Goal 1 (Week 2): Pt will complete LB bathing sit to stand with min assist. OT Short Term Goal 2 (Week 2): Pt will complete LB dressing sit to stand with min assist. OT Short Term Goal 3 (Week 2): Pt will complete toilet transfers with min assist stand pivot to the 3:1. OT Short Term Goal 4 (Week 2): Pt will demonstrate intellectual awareness of physical deficits with no more than min questioning cueing.  Skilled Therapeutic Interventions/Progress Updates:    Pt completed transfer to the toilet to start session where he needed max assist to ambulate from the wheelchair at bedside to the 3:1 over the toilet.  He demonstrated decreased ability to stay close to the walker throughout all transfers during session as well as not turning all the way around and squaring himself up to the surface that he is going to sit on.  Increased posterior lean noted with all standing resulting in the need from min to max assist to maintain his standing balance.  He needed mod assist to complete toilet hygiene sit to stand and then ambulated back out to the wheelchair with mod assist.  Therapist took him down to the tub room where he completed tub transfer with mod assist using the RW and the tub bench.  He was then transferred to the therapy gym where he worked on cognitive task of making PVC pipe puzzle from diagram.  He was able to reproduce puzzle after therapist completed demonstration.  He did need min assist however to recognize errors compared to the diagram.  Finished session with return to the wheelchair and transfer back to the bed with mod assist.  Pt left with call button and phone in reach and bed alarm in place.    Therapy Documentation Precautions:   Precautions Precautions: Fall Precaution Comments: posterior lean in standing Restrictions Weight Bearing Restrictions: No  Pain: Pain Assessment Pain Scale: Faces Pain Score: 0-No pain ADL: See Care Tool Section for some details of mobility and selfcare  Therapy/Group: Individual Therapy  Jahnay Lantier OTR/L 08/25/2019, 4:07 PM

## 2019-08-25 NOTE — Progress Notes (Signed)
Physical Therapy Session Note  Patient Details  Name: Roger Ramirez MRN: [id number] Date of Birth: Nov 25, 1945  Today's Date: 08/25/2019 PT Individual Time: 0865-7846 PT Individual Time Calculation (min): 81 min   Short Term Goals: Week 2:  PT Short Term Goal 1 (Week 2): Pt will perform supine<>sit with min assist PT Short Term Goal 2 (Week 2): Pt will perform sit<>stand with min assist PT Short Term Goal 3 (Week 2): Pt will perform bed<>chair transfer with min assist PT Short Term Goal 4 (Week 2): Pt will ambulate at least 27ft using LRAD with mod assist of 1  Skilled Therapeutic Interventions/Progress Updates:   Pt received in bed attempting to come to sitting on R EOB with pt reporting he needed to use the bathroom - despite call button sitting on bed next to pt he did not initiate calling for assistance. Therapist educated pt on importance of calling for assistance. Assisted pt to EOB with mod assist for pivoting hips  to EOB and trunk upright. Due to urgency used stedy transfer to [facility] over toilet. Sitting EOB>standing in stedy with CGA for safety and pt relying heavily on pulling up with his arms. Standing in stedy with CGA for safety therapist performed total assist LB clothing management. Continent of bowel and bladder - pt had been incontinent of bladder in brief. Standing anterior peri-care with min assist for balance and set-up for wash clothes and total assist posterior peri-care for cleanliness. Stedy transfer to sink. Sitting high perched in stedy donned shirt with set-up assist and close supervision - completed hand hygiene in sink with close supervision. Transitioned to sitting in w/c - sit<>stand to/from stedy seat with CGA for safety relying on B UE support on stedy bar. Sitting in w/c donned pants with max assist and min assist for trunk control due to R lean.  Transported to/from gym in w/c for time management and energy conservation.   Gait training ~44ft x2 with mod assist and +2  w/c follow - continues to demonstrate R posterior lean with decreased L lateral weight shift during stance and decreased R LE hip/knee flexion and step length during swing.  Sit<>stands to/from mat with dual-task of grasping clothespins from low surface and standing to place on basketball goal targeting increased anterior trunk lean coming to stand then standing with L lateral weight shift - mod assist for balance due to R posterior lean and cuing for sequencing with achieving squat position to avoid posterior poor eccentric fall back into the mat when returning to sit. Standing balance using RW targeting L lateral weight shift and  increased R LE hip/knee flexion for increased step length via stepping on/off 2" step progressed to 4" step mod assist progressed to min assist with cuing and visual target for increased R hip abduction to widen BOS/ avoid adducting towards L foot when stepping back off step - progressed to R HHA and L UE support on RW with min assist for balance. Block practice stand pivot transfers w/c<>EOM using RW with mod assist for balance requiring progressively increased assist due to fatigue - multimodal cuing for sequencing of LE stepping and AD management with pt demonstrating difficulty taking lateral side steps. Transported back to room and left seated in w/c with needs in reach, seat belt alarm on, telesitter in place, and repeated education on need to call for assistance.  Therapy Documentation Precautions:  Precautions Precautions: Fall Precaution Comments: posterior lean in standing Restrictions Weight Bearing Restrictions: No  Pain:  Denies pain during  session.  Therapy/Group: Individual Therapy  Ginny Forth, PT, DPT 08/25/2019, 7:56 AM

## 2019-08-26 ENCOUNTER — Inpatient Hospital Stay (HOSPITAL_COMMUNITY): Payer: Medicare Other

## 2019-08-26 LAB — GLUCOSE, CAPILLARY
Glucose-Capillary: 97 mg/dL (ref 70–99)
Glucose-Capillary: 117 mg/dL — ABNORMAL HIGH (ref 70–99)
Glucose-Capillary: 143 mg/dL — ABNORMAL HIGH (ref 70–99)
Glucose-Capillary: 122 mg/dL — ABNORMAL HIGH (ref 70–99)

## 2019-08-26 NOTE — Progress Notes (Signed)
Occupational Therapy Session Note  Patient Details  Name: Roger Ramirez MRN: [id number] Date of Birth: 1946-03-22  Today's Date: 08/26/2019 OT Individual Time: 0900-1000 OT Individual Time Calculation (min): 60 min    Short Term Goals: Week 2:  OT Short Term Goal 1 (Week 2): Pt will complete LB bathing sit to stand with min assist. OT Short Term Goal 2 (Week 2): Pt will complete LB dressing sit to stand with min assist. OT Short Term Goal 3 (Week 2): Pt will complete toilet transfers with min assist stand pivot to the 3:1. OT Short Term Goal 4 (Week 2): Pt will demonstrate intellectual awareness of physical deficits with no more than min questioning cueing.  Skilled Therapeutic Interventions/Progress Updates:  Pt recived supine in bed with pt agreeable to OT intervention. Session focus on BADL retraining, functional mobility and standing/ sitting dynamic balance. Overall, pt required MOD A for supine<>sit with use of bed rails and elevated HOB. Pt required MOD A for stand pivot transfers from EOB<>w/c with no AD and MOD A for sit<>stand with RW. Pt required MOD A to don pants from EOB needing MOD A for standing balance to pull pants up waist line d/t R lateral lean. Pt complete dynamic sitting balance tasks from EOM where pt instructed to replicate lego model while reaching laterally to R to retrieve lego pieces. Pt required CGA- MIN A for sitting balance when reaching out of BOS. Pt able to replicate 3/3 models needing only 1 verbal cue to self correct  mistake. Pt complete dynamic balance task in standing where pt instructed to place clothespins on line. Provided visual feedback with use of mirror as pt heavily leans to R while standing. Little carryover noted from visual feedback however better success noted from tactile cues to pull shoulders to L. Pt complete x3 sit<>stands total with MOD A with RW. Pt noted to attempt to reposition feet in standing making pt lose balance. Educated pt on keeping  feet under hips to facilitate a wide BOS for standing balance tasks. Pt left supine in bed with bed alarm activated and all needs within reach.   Therapy Documentation Precautions:  Precautions Precautions: Fall Precaution Comments: posterior lean in standing Restrictions Weight Bearing Restrictions: No General:   Vital Signs:  Pain: Pt reports no pain during session.   Therapy/Group: Individual Therapy  Angelina Pih 08/26/2019, 12:21 PM

## 2019-08-26 NOTE — Progress Notes (Signed)
[place] PHYSICAL MEDICINE & REHABILITATION PROGRESS NOTE   Subjective/Complaints: No complaints this morning VS stable Participated in OT today- completed sit to stands Mod A with RW  ROS: Patient denies CP, SOB, N/V/D     Objective:   No results found. No results for input(s): WBC, HGB, HCT, PLT in the last 72 hours. No results for input(s): NA, K, CL, CO2, GLUCOSE, BUN, CREATININE, CALCIUM in the last 72 hours.  Intake/Output Summary (Last 24 hours) at 08/26/2019 1804 Last data filed at 08/26/2019 1330 Gross per 24 hour  Intake 760 ml  Output --  Net 760 ml     Physical Exam: Vital Signs Blood pressure 115/68, pulse 80, temperature 98.3 F (36.8 C), temperature source Oral, resp. rate 17, height 5\' 9"  (1.753 m), weight 87.4 kg, SpO2 97 %. General: No acute distress Mood and affect are appropriate Heart: Regular rate and rhythm no rubs murmurs or extra sounds Lungs: Clear to auscultation, breathing unlabored, no rales or wheezes Abdomen: Positive bowel sounds, soft nontender to palpation, nondistended Extremities: No clubbing, cyanosis, or edema Skin: No evidence of breakdown, no evidence of rash Tone MAS 2-3 in Right hamstrings  Neurologic: slow to process.no lability or agitation  Follows simple commands. Fairly alert after I woke him up. Cranial nerves II through XII intact, motor strength is 5/5 in Bilateral  deltoid, bicep, tricep, grip, 5/5 left and 3/5 Right hip flexor, knee extensors, ankle dorsiflexor and plantar flexor Functional mobility: posterior lean in standing Musculoskeletal: Full range of motion in all 4 extremities. No joint swelling   Assessment/Plan: 1. Functional deficits secondary to left ACA infarct which require 3+ hours per day of interdisciplinary therapy in a comprehensive inpatient rehab setting.  Physiatrist is providing close team supervision and 24 hour management of active medical problems listed below.  Physiatrist and rehab team  continue to assess barriers to discharge/monitor patient progress toward functional and medical goals  Care Tool:  Bathing  Bathing activity did not occur: Safety/medical concerns Body parts bathed by patient: Right arm, Left arm, Chest, Abdomen, Front perineal area, Right upper leg, Left upper leg, Right lower leg, Left lower leg, Face   Body parts bathed by helper: Buttocks     Bathing assist Assist Level: Moderate Assistance - Patient 50 - 74%     Upper Body Dressing/Undressing Upper body dressing   What is the patient wearing?: Pull over shirt    Upper body assist Assist Level: Supervision/Verbal cueing    Lower Body Dressing/Undressing Lower body dressing      What is the patient wearing?: Pants     Lower body assist Assist for lower body dressing: Moderate Assistance - Patient 50 - 74%     Toileting Toileting    Toileting assist Assist for toileting: Maximal Assistance - Patient 25 - 49%     Transfers Chair/bed transfer  Transfers assist  Chair/bed transfer activity did not occur: Safety/medical concerns  Chair/bed transfer assist level: Moderate Assistance - Patient 50 - 74%     Locomotion Ambulation   Ambulation assist   Ambulation activity did not occur: Safety/medical concerns  Assist level: 2 helpers(mod A and +2 w/c follow) Assistive device: Walker-rolling Max distance: 84ft   Walk 10 feet activity   Assist  Walk 10 feet activity did not occur: Safety/medical concerns  Assist level: 2 helpers Assistive device: Walker-rolling   Walk 50 feet activity   Assist Walk 50 feet with 2 turns activity did not occur: Safety/medical concerns  Walk 150 feet activity   Assist Walk 150 feet activity did not occur: Safety/medical concerns         Walk 10 feet on uneven surface  activity   Assist Walk 10 feet on uneven surfaces activity did not occur: Safety/medical concerns         Wheelchair     Assist Will patient  use wheelchair at discharge?: (TBD)             Wheelchair 50 feet with 2 turns activity    Assist            Wheelchair 150 feet activity     Assist          Blood pressure 115/68, pulse 80, temperature 98.3 F (36.8 C), temperature source Oral, resp. rate 17, height 5\' 9"  (1.753 m), weight 87.4 kg, SpO2 97 %.  Medical Problem List and Plan: 1.  Right side weakness with dysarthria secondary to left ACA territory acute infarction as well as history of CVA 2020 with cognitive deficits Cognition and RLE weakness are main issues              -patient may shower             -ELOS/Goals: 16-19 days/min/Mod a  Continue CIR PT, OT, SLP 2.  Antithrombotics: -DVT/anticoagulation: Eliquis             -antiplatelet therapy: N/A 3. Pain Management: Tylenol as needed. Well controlled 4. Mood: Melatonin 3 mg nightly             -antipsychotic agents: N/A 5. Neuropsych: This patient is capable of making decisions on his own behalf. Poor concentration attention, initiation , as discussed with pt , methylphenidate trial- increase to 10mg   6. Skin/Wound Care: Routine skin checks 7. Fluids/Electrolytes/Nutrition: Routine in and outs. CMP ordered. 8.  History of saddle pulmonary emboli.  Continue Eliquis 9.  Diabetes mellitus.  Hemoglobin A1c 6.1.  SSI.  Patient on Glucophage 1000 mg twice daily prior to admission.  Resume as needed CBG (last 3)  Recent Labs    08/26/19 0603 08/26/19 1140 08/26/19 1707  GLUCAP 97 117* 143*         controlled 5/15 10.  Hypertension.  Avapro 75 mg daily.           Vitals:   08/26/19 0500 08/26/19 1354  BP: (!) 147/73 115/68  Pulse: 62 80  Resp: 18 17  Temp: 98.1 F (36.7 C) 98.3 F (36.8 C)  SpO2: 97% 97%   Labile will monitor  5/15 115/68- 147/73 11.  Hypothyroidism. Continue Synthroid 12.  Hyperlipidemia. Lipitor 13.  History of tobacco abuse.  Counseling    LOS: 10 days A FACE TO FACE EVALUATION WAS PERFORMED  08/28/19  Kathey Simer 08/26/2019, 6:04 PM

## 2019-08-27 ENCOUNTER — Inpatient Hospital Stay (HOSPITAL_COMMUNITY): Payer: Medicare Other | Admitting: Speech Pathology

## 2019-08-27 LAB — GLUCOSE, CAPILLARY
Glucose-Capillary: 103 mg/dL — ABNORMAL HIGH (ref 70–99)
Glucose-Capillary: 147 mg/dL — ABNORMAL HIGH (ref 70–99)
Glucose-Capillary: 118 mg/dL — ABNORMAL HIGH (ref 70–99)
Glucose-Capillary: 143 mg/dL — ABNORMAL HIGH (ref 70–99)

## 2019-08-27 NOTE — Progress Notes (Signed)
[place] PHYSICAL MEDICINE & REHABILITATION PROGRESS NOTE   Subjective/Complaints: No complaints this morning VS stable Denies pain  ROS: Patient denies CP, SOB, N/V/D       Objective:   No results found. No results for input(s): WBC, HGB, HCT, PLT in the last 72 hours. No results for input(s): NA, K, CL, CO2, GLUCOSE, BUN, CREATININE, CALCIUM in the last 72 hours.  Intake/Output Summary (Last 24 hours) at 08/27/2019 1642 Last data filed at 08/27/2019 1330 Gross per 24 hour  Intake 920 ml  Output 350 ml  Net 570 ml     Physical Exam: Vital Signs Blood pressure (!) 154/79, pulse 68, temperature 98.4 F (36.9 C), temperature source Oral, resp. rate 17, height 5\' 9"  (1.753 m), weight 87.4 kg, SpO2 98 %. General: No acute distress, lying in bed comfortably Mood and affect are appropriate Heart: Regular rate and rhythm no rubs murmurs or extra sounds Lungs: Clear to auscultation, breathing unlabored, no rales or wheezes Abdomen: Positive bowel sounds, soft nontender to palpation, nondistended Extremities: No clubbing, cyanosis, or edema Skin: No evidence of breakdown, no evidence of rash Tone MAS 2-3 in Right hamstrings  Neurologic: slow to process.no lability or agitation  Follows simple commands. Fairly alert after I woke him up. Cranial nerves II through XII intact, motor strength is 5/5 in Bilateral  deltoid, bicep, tricep, grip, 5/5 left and 3/5 Right hip flexor, knee extensors, ankle dorsiflexor and plantar flexor Functional mobility: posterior lean in standing Musculoskeletal: Full range of motion in all 4 extremities. No joint swelling  Assessment/Plan: 1. Functional deficits secondary to left ACA infarct which require 3+ hours per day of interdisciplinary therapy in a comprehensive inpatient rehab setting.  Physiatrist is providing close team supervision and 24 hour management of active medical problems listed below.  Physiatrist and rehab team continue to assess  barriers to discharge/monitor patient progress toward functional and medical goals  Care Tool:  Bathing  Bathing activity did not occur: Safety/medical concerns Body parts bathed by patient: Right arm, Left arm, Chest, Abdomen, Front perineal area, Right upper leg, Left upper leg, Right lower leg, Left lower leg, Face   Body parts bathed by helper: Buttocks     Bathing assist Assist Level: Moderate Assistance - Patient 50 - 74%     Upper Body Dressing/Undressing Upper body dressing   What is the patient wearing?: Pull over shirt    Upper body assist Assist Level: Supervision/Verbal cueing    Lower Body Dressing/Undressing Lower body dressing      What is the patient wearing?: Pants     Lower body assist Assist for lower body dressing: Moderate Assistance - Patient 50 - 74%     Toileting Toileting    Toileting assist Assist for toileting: Maximal Assistance - Patient 25 - 49%     Transfers Chair/bed transfer  Transfers assist  Chair/bed transfer activity did not occur: Safety/medical concerns  Chair/bed transfer assist level: Moderate Assistance - Patient 50 - 74%     Locomotion Ambulation   Ambulation assist   Ambulation activity did not occur: Safety/medical concerns  Assist level: 2 helpers(mod A and +2 w/c follow) Assistive device: Walker-rolling Max distance: 54ft   Walk 10 feet activity   Assist  Walk 10 feet activity did not occur: Safety/medical concerns  Assist level: 2 helpers Assistive device: Walker-rolling   Walk 50 feet activity   Assist Walk 50 feet with 2 turns activity did not occur: Safety/medical concerns  Walk 150 feet activity   Assist Walk 150 feet activity did not occur: Safety/medical concerns         Walk 10 feet on uneven surface  activity   Assist Walk 10 feet on uneven surfaces activity did not occur: Safety/medical concerns         Wheelchair     Assist Will patient use wheelchair at  discharge?: (TBD)             Wheelchair 50 feet with 2 turns activity    Assist            Wheelchair 150 feet activity     Assist          Blood pressure (!) 154/79, pulse 68, temperature 98.4 F (36.9 C), temperature source Oral, resp. rate 17, height 5\' 9"  (1.753 m), weight 87.4 kg, SpO2 98 %.  Medical Problem List and Plan: 1.  Right side weakness with dysarthria secondary to left ACA territory acute infarction as well as history of CVA 2020 with cognitive deficits Cognition and RLE weakness are main issues              -patient may shower             -ELOS/Goals: 16-19 days/min/Mod a  Continue CIR PT, OT, SLP 2.  Antithrombotics: -DVT/anticoagulation: Eliquis             -antiplatelet therapy: N/A 3. Pain Management: Tylenol as needed. Well controlled 4. Mood: Melatonin 3 mg nightly             -antipsychotic agents: N/A 5. Neuropsych: This patient is capable of making decisions on his own behalf. Poor concentration attention, initiation , as discussed with pt , methylphenidate trial- increase to 10mg   6. Skin/Wound Care: Routine skin checks 7. Fluids/Electrolytes/Nutrition: Routine in and outs. CMP ordered. 8.  History of saddle pulmonary emboli.  Continue Eliquis 9.  Diabetes mellitus.  Hemoglobin A1c 6.1.  SSI.  Patient on Glucophage 1000 mg twice daily prior to admission.  Resume as needed CBG (last 3)  Recent Labs    08/27/19 0610 08/27/19 1204 08/27/19 1625  GLUCAP 103* 147* 118*         controlled 5/16 at 118 10.  Hypertension.  Avapro 75 mg daily.           Vitals:   08/27/19 0445 08/27/19 1541  BP: 140/74 (!) 154/79  Pulse: (!) 55 68  Resp: 17 17  Temp: 98 F (36.7 C) 98.4 F (36.9 C)  SpO2: 95% 98%   Labile will monitor  5/16, can consider increasing Avapro if consistently high 11.  Hypothyroidism. Continue Synthroid 12.  Hyperlipidemia. Lipitor 13.  History of tobacco abuse.  Counseling    LOS: 11 days A FACE TO FACE  EVALUATION WAS PERFORMED  08/29/19 Brenisha Tsui 08/27/2019, 4:42 PM

## 2019-08-27 NOTE — Progress Notes (Signed)
Speech Language Pathology Daily Session Note  Patient Details  Name: Roger Ramirez MRN: [id number] Date of Birth: 1945-09-04  Today's Date: 08/27/2019 SLP Individual Time: 4627-0350 SLP Individual Time Calculation (min): 40 min  Short Term Goals: Week 2: SLP Short Term Goal 1 (Week 2): Pt will recall 2 safety precaution with Max A multimodal cues. SLP Short Term Goal 2 (Week 2): Pt will identify 2 cognitive and 2 physical impairment with Max A multimodal cues. SLP Short Term Goal 3 (Week 2): Pt will use external aids to recall daily information with Mod A. SLP Short Term Goal 4 (Week 2): Pt will demonstate ability to problem solve during basic and familiar tasks with Mod A. SLP Short Term Goal 5 (Week 2): Pt will demonstrate use word finding strategies in functional communication exchanges with Mod I.  Skilled Therapeutic Interventions: Patient received skilled SLP services targeting cognitive goals. Patient required max verbal cues to implement use of calendar to determine today's date. SLP facilitated use of list making an external compensatory memory strategy with patient creating a grocery list of 10 items he needs at the store following max verbal cues. Patient required max verbal cues to utilize his hospital restaurant menu to identify today's specials and what he would like to order. At the end of therapy session patient was upright in bed, bed alarm activated, and all needs within reach.  Pain Pain Assessment Pain Scale: 0-10 Pain Score: 0-No pain  Therapy/Group: Individual Therapy  Arnette Schaumann 08/27/2019, 10:53 AM

## 2019-08-28 ENCOUNTER — Inpatient Hospital Stay (HOSPITAL_COMMUNITY): Payer: Medicare Other | Admitting: Occupational Therapy

## 2019-08-28 ENCOUNTER — Inpatient Hospital Stay (HOSPITAL_COMMUNITY): Payer: Medicare Other

## 2019-08-28 ENCOUNTER — Inpatient Hospital Stay (HOSPITAL_COMMUNITY): Payer: Medicare Other | Admitting: Speech Pathology

## 2019-08-28 LAB — GLUCOSE, CAPILLARY
Glucose-Capillary: 106 mg/dL — ABNORMAL HIGH (ref 70–99)
Glucose-Capillary: 166 mg/dL — ABNORMAL HIGH (ref 70–99)
Glucose-Capillary: 120 mg/dL — ABNORMAL HIGH (ref 70–99)
Glucose-Capillary: 133 mg/dL — ABNORMAL HIGH (ref 70–99)

## 2019-08-28 NOTE — Progress Notes (Signed)
[place] PHYSICAL MEDICINE & REHABILITATION PROGRESS NOTE   Subjective/Complaints: Up in bed. Just finished breakfast. No complaints  ROS: Patient denies fever, rash, sore throat, blurred vision, nausea, vomiting, diarrhea, cough, shortness of breath or chest pain, joint or back pain, headache, or mood change.      Objective:   No results found. No results for input(s): WBC, HGB, HCT, PLT in the last 72 hours. No results for input(s): NA, K, CL, CO2, GLUCOSE, BUN, CREATININE, CALCIUM in the last 72 hours.  Intake/Output Summary (Last 24 hours) at 08/28/2019 1350 Last data filed at 08/28/2019 0848 Gross per 24 hour  Intake 616 ml  Output 150 ml  Net 466 ml     Physical Exam: Vital Signs Blood pressure 105/75, pulse (!) 101, temperature 97.6 F (36.4 C), resp. rate 17, height 5\' 9"  (1.753 m), weight 87.4 kg, SpO2 100 %. Constitutional: No distress . Vital signs reviewed. HEENT: EOMI, oral membranes moist Neck: supple Cardiovascular: RRR without murmur. No JVD    Respiratory/Chest: CTA Bilaterally without wheezes or rales. Normal effort    GI/Abdomen: BS +, non-tender, non-distended Ext: no clubbing, cyanosis, or edema Psych: pleasant and cooperative Tone MAS 2/4 in Right hamstrings  Neurologic: slow processing. Follows basic commands. Cranial nerves II through XII intact, motor strength is 5/5 in Bilateral  deltoid, bicep, tricep, grip, 5/5 left and 3/5 Right hip flexor, knee extensors, ankle dorsiflexor and plantar flexor Functional mobility: posterior lean in standing Musculoskeletal: Full range of motion in all 4 extremities. No joint swelling  Assessment/Plan: 1. Functional deficits secondary to left ACA infarct which require 3+ hours per day of interdisciplinary therapy in a comprehensive inpatient rehab setting.  Physiatrist is providing close team supervision and 24 hour management of active medical problems listed below.  Physiatrist and rehab team continue to  assess barriers to discharge/monitor patient progress toward functional and medical goals  Care Tool:  Bathing  Bathing activity did not occur: Safety/medical concerns Body parts bathed by patient: Right arm, Left arm, Chest, Abdomen, Front perineal area, Right upper leg, Left upper leg, Right lower leg, Left lower leg, Face   Body parts bathed by helper: Buttocks     Bathing assist Assist Level: Moderate Assistance - Patient 50 - 74%     Upper Body Dressing/Undressing Upper body dressing   What is the patient wearing?: Pull over shirt    Upper body assist Assist Level: Supervision/Verbal cueing    Lower Body Dressing/Undressing Lower body dressing      What is the patient wearing?: Pants     Lower body assist Assist for lower body dressing: Moderate Assistance - Patient 50 - 74%     Toileting Toileting    Toileting assist Assist for toileting: Maximal Assistance - Patient 25 - 49%     Transfers Chair/bed transfer  Transfers assist  Chair/bed transfer activity did not occur: Safety/medical concerns  Chair/bed transfer assist level: Moderate Assistance - Patient 50 - 74%     Locomotion Ambulation   Ambulation assist   Ambulation activity did not occur: Safety/medical concerns  Assist level: Maximal Assistance - Patient 25 - 49% Assistive device: Walker-rolling Max distance: 20 ft   Walk 10 feet activity   Assist  Walk 10 feet activity did not occur: Safety/medical concerns  Assist level: Maximal Assistance - Patient 25 - 49% Assistive device: Walker-rolling   Walk 50 feet activity   Assist Walk 50 feet with 2 turns activity did not occur: Safety/medical concerns  Walk 150 feet activity   Assist Walk 150 feet activity did not occur: Safety/medical concerns         Walk 10 feet on uneven surface  activity   Assist Walk 10 feet on uneven surfaces activity did not occur: Safety/medical concerns          Wheelchair     Assist Will patient use wheelchair at discharge?: (TBD)             Wheelchair 50 feet with 2 turns activity    Assist            Wheelchair 150 feet activity     Assist          Blood pressure 105/75, pulse (!) 101, temperature 97.6 F (36.4 C), resp. rate 17, height 5\' 9"  (1.753 m), weight 87.4 kg, SpO2 100 %.  Medical Problem List and Plan: 1.  Right side weakness with dysarthria secondary to left ACA territory acute infarction as well as history of CVA 2020 with cognitive deficits Cognition and RLE weakness are main issues              -patient may shower             -ELOS/Goals: 16-19 days/min/Mod a  Continue CIR PT, OT, SLP 2.  Antithrombotics: -DVT/anticoagulation: Eliquis             -antiplatelet therapy: N/A 3. Pain Management: Tylenol as needed. Well controlled 4. Mood: Melatonin 3 mg nightly             -antipsychotic agents: N/A 5. Neuropsych: This patient is capable of making decisions on his own behalf. Poor concentration attention, initiation , as discussed with pt , methylphenidate trial- increased to 10mg --appears more focused then when I saw him last week  6. Skin/Wound Care: Routine skin checks 7. Fluids/Electrolytes/Nutrition: Routine in and outs. CMP ordered. 8.  History of saddle pulmonary emboli.  Continue Eliquis 9.  Diabetes mellitus.  Hemoglobin A1c 6.1.  SSI.  Patient on Glucophage 1000 mg twice daily prior to admission.  Resume as needed CBG (last 3)  Recent Labs    08/27/19 2107 08/28/19 0614 08/28/19 1131  GLUCAP 143* 106* 166*         reasonable control 5/17 10.  Hypertension.  Avapro 75 mg daily.           Vitals:   08/28/19 0849 08/28/19 1338  BP: (!) 143/62 105/75  Pulse: 60 (!) 101  Resp:  17  Temp:  97.6 F (36.4 C)  SpO2:  100%   Reasonable control. 5/17, can consider increasing Avapro if elevates again. 11.  Hypothyroidism. Continue Synthroid 12.  Hyperlipidemia. Lipitor 13.   History of tobacco abuse.  Counseling    LOS: 12 days A FACE TO FACE EVALUATION WAS PERFORMED  Meredith Staggers 08/28/2019, 1:50 PM

## 2019-08-28 NOTE — Progress Notes (Signed)
Occupational Therapy Session Note  Patient Details  Name: Roger Ramirez MRN: [id number] Date of Birth: Sep 15, 1945  Today's Date: 08/28/2019 OT Individual Time: 1420-1529 OT Individual Time Calculation (min): 69 min    Short Term Goals: Week 2:  OT Short Term Goal 1 (Week 2): Pt will complete LB bathing sit to stand with min assist. OT Short Term Goal 2 (Week 2): Pt will complete LB dressing sit to stand with min assist. OT Short Term Goal 3 (Week 2): Pt will complete toilet transfers with min assist stand pivot to the 3:1. OT Short Term Goal 4 (Week 2): Pt will demonstrate intellectual awareness of physical deficits with no more than min questioning cueing.  Skilled Therapeutic Interventions/Progress Updates:    Pt in bed to start session.  He was able to transfer to sitting from supine with min assist.  He then ambulated to the walk-in shower with max assist and use of the RW.  Increased LOB posterior and to the right throughout mobility.  Decreased step length noted on the right as well with staggered stance achieve involuntarily at times.  Once in the shower, he was able to remove dirty clothing at max assist level sit to stand and complete bathing.  Max instructional cueing was needed for bathing to sequence washing and apply soap to the washcloth and shampoo to his hair.  Pt would periodically look a the therapist for direction.  He needed mod assist for bathing sit to stand as well.  Next, he was able to complete transfer out to the wheelchair at max assist level.  He then worked on dressing with supervision for donning a pullover shirt and mod assist for donning brief and pants.  He was able to donn new gripper socks with min guard and increased time secondary to right lean in the chair when attempting to donn the left one.  He was taken down to the ortho gym for last part of session where he worked on standing balance with use of the [employer].  Max assist was needed to maintain balance  while engaged in the task.  He was only able to complete for approximately one minute before falling back into the wheelchair.  Pt returned to the room and then transferred back to the bed from the wheelchair with max assist stand pivot using the RW.  Call button in reach and safety alarm in place.    Therapy Documentation Precautions:  Precautions Precautions: Fall Precaution Comments: posterior lean in standing Restrictions Weight Bearing Restrictions: No  Pain: Pain Assessment Pain Scale: Faces Pain Score: 0-No pain ADL: See Care Tool Section for some details of mobility and selfcare  Therapy/Group: Individual Therapy  Edith Lord OTR/L 08/28/2019, 4:46 PM

## 2019-08-28 NOTE — Progress Notes (Signed)
Physical Therapy Session Note  Patient Details  Name: Roger Ramirez MRN: [id number] Date of Birth: 04-06-1946  Today's Date: 08/28/2019 PT Individual Time: 0903-1013 PT Individual Time Calculation (min): 70 min    Short Term Goals: Week 2:  PT Short Term Goal 1 (Week 2): Pt will perform supine<>sit with min assist PT Short Term Goal 2 (Week 2): Pt will perform sit<>stand with min assist PT Short Term Goal 3 (Week 2): Pt will perform bed<>chair transfer with min assist PT Short Term Goal 4 (Week 2): Pt will ambulate at least 66ft using LRAD with mod assist of 1  Skilled Therapeutic Interventions/Progress Updates:    Pt supine in bed upon PT arrival, agreeable to therapy tx and denies pain. Pt with decreased attention and slow processing, requiring cues throughout session for mobility and sequencing. Pt transferred to sitting with mod assist and increased time, cues for correction of posterior lean. Donned pants, sit<>stand with mod assist to pull pants over hips. Stand pivot to w/c with RW and mod assist secondary to posterior lean and assist with RW management. Pt transported to the gym in w/c. Pt ambulated x 20 ft to the mat with mod assist when walking forwards with RW, max assist for turn to sit on the mat, cues throughout. Pt worked on static standing balance this session with use of RW, emphasis on trying to correct posterior lean, pt intermittently maintains balance with CGA but will then demonstrate total posterior LOB back onto the mat with no attempts to correct, x 3 trials - 1-3 min standing bouts. Pt then worked on dynamic standing balance with single UE support on RW while performing reaching task to toss horseshoes, x 2 trials, pt with R lateral lean/posterior lean, requiring cues to correct and worked on reaching L to correct, min-mod assist. Pt ambulated x 10 ft mod assist with RW and turn to sit in w/c, improved overall balance noted, cues for R step length and foot clearance. Pt  reports incontinence of bowel, transported back to room. Pt transferred to toilet with stedy dependently, standing balance min assist while therapist performed clothing management total assist. Pt with noted small BM incontinent, seated on toilet to try to further void. Pt continent of bladder, sit<>Stands withing stedy min-mod assist with R lean noted in standing and cues to correct, total assist clothing management and pericare. Pt transferred to w/c dependent with stedy, left in w/c with needs in reach and chair alarm set.   Therapy Documentation Precautions:  Precautions Precautions: Fall Precaution Comments: posterior lean in standing Restrictions Weight Bearing Restrictions: No  Therapy/Group: Individual Therapy  Cresenciano Genre, PT, DPT, CSRS 08/28/2019, 7:46 AM

## 2019-08-28 NOTE — Plan of Care (Signed)
  Problem: Consults Goal: RH STROKE PATIENT EDUCATION Description: See Patient Education module for education specifics  Outcome: Progressing   Problem: RH BLADDER ELIMINATION Goal: RH STG MANAGE BLADDER WITH ASSISTANCE Description: STG Manage Bladder With min Assistance Outcome: Progressing   Problem: RH SAFETY Goal: RH STG ADHERE TO SAFETY PRECAUTIONS W/ASSISTANCE/DEVICE Description: STG Adhere to Safety Precautions With cues/reminders Assistance/Device. Outcome: Progressing   Problem: RH COGNITION-NURSING Goal: RH STG USES MEMORY AIDS/STRATEGIES W/ASSIST TO PROBLEM SOLVE Description: STG Uses Memory Aids/Strategies With cues/reminders Assistance to Problem Solve. Outcome: Progressing   Problem: RH KNOWLEDGE DEFICIT Goal: RH STG INCREASE KNOWLEDGE OF HYPERTENSION Description: Pt/family able to demonstrate understanding of HTN management using diet control and medication compliance with booklets/handouts mod I assist prior to DC.  Outcome: Progressing Goal: RH STG INCREASE KNOWLEGDE OF HYPERLIPIDEMIA Description: Pt/family able to demonstrate understanding of HLD management using diet control and medication compliance with booklets/handouts mod I assist prior to DC.  Outcome: Progressing Goal: RH STG INCREASE KNOWLEDGE OF STROKE PROPHYLAXIS Description: Pt/family able to demonstrate understanding of stroke prevention management using diet control and medication compliance with booklets/handouts mod I assist prior to DC.  Outcome: Progressing

## 2019-08-28 NOTE — Progress Notes (Signed)
Speech Language Pathology Daily Session Note  Patient Details  Name: Roger Ramirez MRN: [id number] Date of Birth: Aug 11, 1945  Today's Date: 08/28/2019 SLP Individual Time: 1255-1335 SLP Individual Time Calculation (min): 40 min  Short Term Goals: Week 2: SLP Short Term Goal 1 (Week 2): Pt will recall 2 safety precaution with Max A multimodal cues. SLP Short Term Goal 2 (Week 2): Pt will identify 2 cognitive and 2 physical impairment with Max A multimodal cues. SLP Short Term Goal 3 (Week 2): Pt will use external aids to recall daily information with Mod A. SLP Short Term Goal 4 (Week 2): Pt will demonstate ability to problem solve during basic and familiar tasks with Mod A. SLP Short Term Goal 5 (Week 2): Pt will demonstrate use word finding strategies in functional communication exchanges with Mod I.  Skilled Therapeutic Interventions: Skilled treatment session focused on cognitive goals. Upon arrival, patient was upright in the wheelchair attempting to remove the quick release belt. Patient's belt was readjusted and the patient no longer made attempts to remove while SLP was in the room. SLP facilitated session by providing total A for recall of events from previous therapy sessions but utilized visual aids for orientation to time with Min verbal cues. Patient also participated in a 4-step picture sequencing task with supervision level verbal cues for problem solving. However, Mod-Max A verbal cues were required for problem solving during a 6-step picture sequencing task. Patient left upright in the wheelchair with alarm on and all needs within reach. Continue with current plan of care.      Pain No/Denies Pain   Therapy/Group: Individual Therapy  Verina Galeno 08/28/2019, 2:47 PM

## 2019-08-29 ENCOUNTER — Inpatient Hospital Stay (HOSPITAL_COMMUNITY): Payer: Medicare Other | Admitting: Physical Therapy

## 2019-08-29 ENCOUNTER — Inpatient Hospital Stay (HOSPITAL_COMMUNITY): Payer: Medicare Other | Admitting: Occupational Therapy

## 2019-08-29 ENCOUNTER — Inpatient Hospital Stay (HOSPITAL_COMMUNITY): Payer: Medicare Other | Admitting: Speech Pathology

## 2019-08-29 LAB — GLUCOSE, CAPILLARY
Glucose-Capillary: 115 mg/dL — ABNORMAL HIGH (ref 70–99)
Glucose-Capillary: 144 mg/dL — ABNORMAL HIGH (ref 70–99)
Glucose-Capillary: 113 mg/dL — ABNORMAL HIGH (ref 70–99)
Glucose-Capillary: 139 mg/dL — ABNORMAL HIGH (ref 70–99)

## 2019-08-29 NOTE — Progress Notes (Signed)
[facility] PHYSICAL MEDICINE & REHABILITATION PROGRESS NOTE   Subjective/Complaints: Up in bed. Just finished breakfast. No complaints. Working with SLP. Denies pain.  ROS: Patient denies fever, rash, sore throat, blurred vision, nausea, vomiting, diarrhea, cough, shortness of breath or chest pain, joint or back pain, headache, or mood change.      Objective:   No results found. No results for input(s): WBC, HGB, HCT, PLT in the last 72 hours. No results for input(s): NA, K, CL, CO2, GLUCOSE, BUN, CREATININE, CALCIUM in the last 72 hours.  Intake/Output Summary (Last 24 hours) at 08/29/2019 0840 Last data filed at 08/29/2019 0051 Gross per 24 hour  Intake 816 ml  Output 150 ml  Net 666 ml     Physical Exam: Vital Signs Blood pressure (!) 142/71, pulse 67, temperature 98 F (36.7 C), resp. rate 18, height 5\' 9"  (1.753 m), weight 87.4 kg, SpO2 97 %. Constitutional: No distress . Vital signs reviewed. Working with SLP HEENT: EOMI, oral membranes moist Neck: supple Cardiovascular: RRR without murmur. No JVD    Respiratory/Chest: CTA Bilaterally without wheezes or rales. Normal effort    GI/Abdomen: BS +, non-tender, non-distended Ext: no clubbing, cyanosis, or edema Psych: pleasant and cooperative Tone MAS 2/4 in Right hamstrings  Neurologic: slow processing. Follows basic commands. Cranial nerves II through XII intact, motor strength is 5/5 in Bilateral  deltoid, bicep, tricep, grip, 5/5 left and 3/5 Right hip flexor, knee extensors, ankle dorsiflexor and plantar flexor Functional mobility: posterior lean in standing Musculoskeletal: Full range of motion in all 4 extremities. No joint swelling  Assessment/Plan: 1. Functional deficits secondary to left ACA infarct which require 3+ hours per day of interdisciplinary therapy in a comprehensive inpatient rehab setting.  Physiatrist is providing close team supervision and 24 hour management of active medical problems listed  below.  Physiatrist and rehab team continue to assess barriers to discharge/monitor patient progress toward functional and medical goals  Care Tool:  Bathing  Bathing activity did not occur: Safety/medical concerns Body parts bathed by patient: Right arm, Left arm, Chest, Abdomen, Front perineal area, Right upper leg, Left upper leg, Right lower leg, Left lower leg, Face   Body parts bathed by helper: Buttocks     Bathing assist Assist Level: Moderate Assistance - Patient 50 - 74%     Upper Body Dressing/Undressing Upper body dressing   What is the patient wearing?: Pull over shirt    Upper body assist Assist Level: Supervision/Verbal cueing    Lower Body Dressing/Undressing Lower body dressing      What is the patient wearing?: Pants, Incontinence brief     Lower body assist Assist for lower body dressing: Moderate Assistance - Patient 50 - 74%     Toileting Toileting    Toileting assist Assist for toileting: Maximal Assistance - Patient 25 - 49%     Transfers Chair/bed transfer  Transfers assist  Chair/bed transfer activity did not occur: Safety/medical concerns  Chair/bed transfer assist level: Maximal Assistance - Patient 25 - 49%     Locomotion Ambulation   Ambulation assist   Ambulation activity did not occur: Safety/medical concerns  Assist level: Maximal Assistance - Patient 25 - 49% Assistive device: Walker-rolling Max distance: 12'   Walk 10 feet activity   Assist  Walk 10 feet activity did not occur: Safety/medical concerns  Assist level: Maximal Assistance - Patient 25 - 49% Assistive device: Walker-rolling   Walk 50 feet activity   Assist Walk 50 feet with 2  turns activity did not occur: Safety/medical concerns         Walk 150 feet activity   Assist Walk 150 feet activity did not occur: Safety/medical concerns         Walk 10 feet on uneven surface  activity   Assist Walk 10 feet on uneven surfaces activity did  not occur: Safety/medical concerns         Wheelchair     Assist Will patient use wheelchair at discharge?: (TBD)             Wheelchair 50 feet with 2 turns activity    Assist            Wheelchair 150 feet activity     Assist          Blood pressure (!) 142/71, pulse 67, temperature 98 F (36.7 C), resp. rate 18, height 5\' 9"  (1.753 m), weight 87.4 kg, SpO2 97 %.  Medical Problem List and Plan: 1.  Right side weakness with dysarthria secondary to left ACA territory acute infarction as well as history of CVA 2020 with cognitive deficits Cognition and RLE weakness are main issues              -patient may shower             -ELOS/Goals: 16-19 days/min/Mod a  Continue CIR PT, OT, SLP 2.  Antithrombotics: -DVT/anticoagulation: Eliquis             -antiplatelet therapy: N/A 3. Pain Management: Tylenol as needed. Well controlled 4. Mood: Melatonin 3 mg nightly             -antipsychotic agents: N/A 5. Neuropsych: This patient is capable of making decisions on his own behalf. Poor concentration attention, initiation , as discussed with pt , methylphenidate trial- increased to 10mg --appears more focused then when I saw him last week   Benefits have leveled off. Helps to turn off the TV to limit his distraction when speaking with him. 6. Skin/Wound Care: Routine skin checks 7. Fluids/Electrolytes/Nutrition: Routine in and outs. CMP ordered. 8.  History of saddle pulmonary emboli.  Continue Eliquis 9.  Diabetes mellitus.  Hemoglobin A1c 6.1.  SSI.  Patient on Glucophage 1000 mg twice daily prior to admission.  Resume as needed CBG (last 3)  Recent Labs    08/28/19 1706 08/28/19 2143 08/29/19 0606  GLUCAP 120* 133* 115*         reasonable control 5/18 at 115 10.  Hypertension.  Avapro 75 mg daily.           Vitals:   08/28/19 1941 08/29/19 0531  BP: (!) 145/76 (!) 142/71  Pulse: 80 67  Resp: 18   Temp: 97.6 F (36.4 C) 98 F (36.7 C)  SpO2: 93%  97%   Reasonable control. 5/17, can consider increasing Avapro if elevates again.  5/18: Labile. Maintain dose 11.  Hypothyroidism. Continue Synthroid 12.  Hyperlipidemia. Lipitor 13.  History of tobacco abuse.  Counseling    LOS: 13 days A FACE TO FACE EVALUATION WAS PERFORMED  Rosha Cocker P Renaud Celli 08/29/2019, 8:40 AM

## 2019-08-29 NOTE — Progress Notes (Signed)
Physical Therapy Session Note  Patient Details  Name: Roger Ramirez MRN: [id number] Date of Birth: 1945-11-09  Today's Date: 08/29/2019 PT Individual Time: 9381-8299 PT Individual Time Calculation (min): 59 min   Short Term Goals: Week 2:  PT Short Term Goal 1 (Week 2): Pt will perform supine<>sit with min assist PT Short Term Goal 2 (Week 2): Pt will perform sit<>stand with min assist PT Short Term Goal 3 (Week 2): Pt will perform bed<>chair transfer with min assist PT Short Term Goal 4 (Week 2): Pt will ambulate at least 66ft using LRAD with mod assist of 1  Skilled Therapeutic Interventions/Progress Updates:   Pt received sitting in w/c and agreeable to therapy session. Sit<>stand using RW with min assist for lifting into standing due to R posterior lean.  Transported to/from gym in w/c for time management and energy conservation. Gait training ~16-42ft using RW x3 trials with min assist for balance due to R lateral posterior lean - continues to demonstrates poor L weight shift during stance with R lateral hip shift and decreased R LE step length and foot clearance. Standing R LE NMR targeting L lateral weight shift for increased R hip/knee flexion via foot taps on 8" step using B UE support on RW - min assist for balance. Sitting>supine>prone>quadruped>tall kneeling with B UE support on bench with mod/max assist of 1 and +2 present for safety. In tall kneeling with L HHA performed R UE reaching task to place clothespins on basketball goal - mod assist and manual facilitation for increased hip extension. Returned to sitting EOM via reverse of the above with mod assist. Repeated sit<>stands targeting increased anterior weight shift during transfer via grasping clothespins from low surface and placing on high surface to the L for increased L lateral weight shift with min assist for balance. R LE NMR targeting increased R knee extension during stance via L foot taps on 8" step using RW support - mod  assist for balance - cuing and manual facilitation for increased knee extension. R stand pivot to w/c using RW with min/mod assist for balance and AD management. Transported back to room and pt requesting to return to bed and rest. R stand pivot using bedrail with mod assist for balance. Sit>supine with min assist for B LE management into the bed. Pt left supine in bed with needs in reach and bed alarm on.  Therapy Documentation Precautions:  Precautions Precautions: Fall Precaution Comments: posterior and right lean in standing Restrictions Weight Bearing Restrictions: No  Pain: Denies pain during session.   Therapy/Group: Individual Therapy  Ginny Forth, PT, DPT 08/29/2019, 12:25 PM

## 2019-08-29 NOTE — Progress Notes (Signed)
Speech Language Pathology Daily Session Note  Patient Details  Name: Roger Ramirez MRN: [id number] Date of Birth: [birth year]  Today's Date: 08/29/2019 SLP Individual Time: 0801-0900 SLP Individual Time Calculation (min): 59 min  Short Term Goals: Week 2: SLP Short Term Goal 1 (Week 2): Pt will recall 2 safety precaution with Max A multimodal cues. SLP Short Term Goal 2 (Week 2): Pt will identify 2 cognitive and 2 physical impairment with Max A multimodal cues. SLP Short Term Goal 3 (Week 2): Pt will use external aids to recall daily information with Mod A. SLP Short Term Goal 4 (Week 2): Pt will demonstate ability to problem solve during basic and familiar tasks with Mod A. SLP Short Term Goal 5 (Week 2): Pt will demonstrate use word finding strategies in functional communication exchanges with Mod I.  Skilled Therapeutic Interventions: Pt was seen for skilled ST targeting cognitive goals. Upon arrival, pt was still consuming breakfast. SLP took over full supervision needs, adjusted pt to optimal positioning for PO intake and eliminated visual and auditory distractions that were inhibiting sustained attention to intake. In a controlled environment, Min A verbal cues/prompts to redirect to task were required, and it took pt more than reasonable amount of time to finish his meal, as a result. Recommend continuing full supervision during meals due to decreased attention and awareness. During a basic money management task, pt sorted coins with only Supervision, but increased Mod A verbal and visual cues were required for problem solving and recall to calculate totals and make change. Pt intermittently aware of errors. Pt left laying in bed with alarm set and needs within reach, telesitter active. Continue per current plan of care.        Pain Pain Assessment Pain Scale: 0-10 Pain Score: 0-No pain Faces Pain Scale: No hurt  Therapy/Group: Individual Therapy  [name] 08/29/2019, 6:59  AM

## 2019-08-29 NOTE — Progress Notes (Addendum)
Occupational Therapy Session Note  Patient Details  Name: Roger Ramirez MRN: [id number] Date of Birth: Aug 13, 1945  Today's Date: 08/29/2019 OT Individual Time: 4431-5400 OT Individual Time Calculation (min): 57 min    Short Term Goals: Week 2:  OT Short Term Goal 1 (Week 2): Pt will complete LB bathing sit to stand with min assist. OT Short Term Goal 2 (Week 2): Pt will complete LB dressing sit to stand with min assist. OT Short Term Goal 3 (Week 2): Pt will complete toilet transfers with min assist stand pivot to the 3:1. OT Short Term Goal 4 (Week 2): Pt will demonstrate intellectual awareness of physical deficits with no more than min questioning cueing.  Skilled Therapeutic Interventions/Progress Updates:    Pt in bed to start session, worked on transfer from supine to sit EOB with mod assist and mod demonstrational cueing for technique.  He then needed min assist for balance EOB before transferring to the wheelchair with max assist in order to transition to the sink for cleaning up bladder incontinence.  He needed max assist to stand at the sink while washing his peri area.  He then donned the brief and pants with min assist over his feet and max assist for standing to pull them up over her hips.  With standing, he exhibited bowel incontinence so transfer to the toilet was completed with max assist.  He needed max assist for toilet hygiene and clothing management sit to stand.  He was able to then transfer to the wheelchair with max assist and out to the sink for washing hands and work on standing balance.  Had pt stand with use of the sink and work on forward weight shifts while standing as he demonstrates increased knee flexion and posterior and right lean.  He needed max assist for standing balance.  Finished session with pt in the wheelchair and call button and phone in reach with safety belt in place and telesitter in place as well.      Therapy Documentation Precautions:   Precautions Precautions: Fall Precaution Comments: posterior and right lean in standing Restrictions Weight Bearing Restrictions: No  Pain: Pain Assessment Pain Scale: 0-10 Pain Score: 0-No pain Faces Pain Scale: No hurt ADL: See Care Tool Section for some details of mobility and selfcare  Therapy/Group: Individual Therapy  Darian Ace OTR/L 08/29/2019, 12:19 PM

## 2019-08-29 NOTE — Plan of Care (Signed)
  Problem: Consults Goal: RH STROKE PATIENT EDUCATION Description: See Patient Education module for education specifics  Outcome: Progressing   Problem: RH BOWEL ELIMINATION Goal: RH STG MANAGE BOWEL WITH ASSISTANCE Description: STG Manage Bowel with min Assistance. Outcome: Progressing Goal: RH STG MANAGE BOWEL W/MEDICATION W/ASSISTANCE Description: STG Manage Bowel with Medication with mod I Assistance. Outcome: Progressing   Problem: RH BLADDER ELIMINATION Goal: RH STG MANAGE BLADDER WITH ASSISTANCE Description: STG Manage Bladder With min Assistance Outcome: Progressing   Problem: RH SAFETY Goal: RH STG ADHERE TO SAFETY PRECAUTIONS W/ASSISTANCE/DEVICE Description: STG Adhere to Safety Precautions With cues/reminders Assistance/Device. Outcome: Progressing   Problem: RH COGNITION-NURSING Goal: RH STG USES MEMORY AIDS/STRATEGIES W/ASSIST TO PROBLEM SOLVE Description: STG Uses Memory Aids/Strategies With cues/reminders Assistance to Problem Solve. Outcome: Progressing   Problem: RH KNOWLEDGE DEFICIT Goal: RH STG INCREASE KNOWLEDGE OF HYPERTENSION Description: Pt/family able to demonstrate understanding of HTN management using diet control and medication compliance with booklets/handouts mod I assist prior to DC.  Outcome: Progressing Goal: RH STG INCREASE KNOWLEGDE OF HYPERLIPIDEMIA Description: Pt/family able to demonstrate understanding of HLD management using diet control and medication compliance with booklets/handouts mod I assist prior to DC.  Outcome: Progressing Goal: RH STG INCREASE KNOWLEDGE OF STROKE PROPHYLAXIS Description: Pt/family able to demonstrate understanding of stroke prevention management using diet control and medication compliance with booklets/handouts mod I assist prior to DC.  Outcome: Progressing   

## 2019-08-30 ENCOUNTER — Inpatient Hospital Stay (HOSPITAL_COMMUNITY): Payer: Medicare Other

## 2019-08-30 ENCOUNTER — Inpatient Hospital Stay (HOSPITAL_COMMUNITY): Payer: Medicare Other | Admitting: Occupational Therapy

## 2019-08-30 LAB — GLUCOSE, CAPILLARY
Glucose-Capillary: 102 mg/dL — ABNORMAL HIGH (ref 70–99)
Glucose-Capillary: 123 mg/dL — ABNORMAL HIGH (ref 70–99)
Glucose-Capillary: 132 mg/dL — ABNORMAL HIGH (ref 70–99)
Glucose-Capillary: 145 mg/dL — ABNORMAL HIGH (ref 70–99)

## 2019-08-30 NOTE — Progress Notes (Signed)
Speech Language Pathology Daily Session Note  Patient Details  Name: Roger Ramirez MRN: [id number] Date of Birth: October 06, 1945  Today's Date: 08/30/2019 SLP Individual Time: 1105-1130 SLP Individual Time Calculation (min): 25 min  Short Term Goals: Week 2: SLP Short Term Goal 1 (Week 2): Pt will recall 2 safety precaution with Max A multimodal cues. SLP Short Term Goal 2 (Week 2): Pt will identify 2 cognitive and 2 physical impairment with Max A multimodal cues. SLP Short Term Goal 3 (Week 2): Pt will use external aids to recall daily information with Mod A. SLP Short Term Goal 4 (Week 2): Pt will demonstate ability to problem solve during basic and familiar tasks with Mod A. SLP Short Term Goal 5 (Week 2): Pt will demonstrate use word finding strategies in functional communication exchanges with Mod I.  Skilled Therapeutic Interventions: Skilled ST services focused on cognitive skills. Pt demonstrated overall improvement in cognitive skills compared to last session with Clinical research associate. SLP facilitated recall of call bell, pt initial required verbal cues to locate button on the top of remote but demonstrated recall in 5 and 10 minute intervals. Pt was orientated to place, time (expect date) and disoriented to situation. Pt was able to utilize memory notebook to aid in recall of today's vents with mod A verbal cues. SLP facilitated basic problem solving skills in creation of basic to mildly complex patterns with colored blocks mod I. SLP also facilitated basic problem solving skills in sorting and displaying basic change, pt required supervision A verbal cues to correct x1 error. Pt was left in room with call bell within reach and bed/chair alarm set. ST recommends to continue skilled ST services.      Pain Pain Assessment Pain Scale: Faces Pain Score: 0-No pain  Therapy/Group: Individual Therapy  Joleah Kosak  [facility] 08/30/2019, 11:38 AM

## 2019-08-30 NOTE — Patient Care Conference (Signed)
Inpatient RehabilitationTeam Conference and Plan of Care Update Date: 08/30/2019   Time: 2:04 PM    Patient Name: Roger Ramirez      Medical Record Number: [id number]  Date of Birth: 11-17-1945 Sex: Male         Room/Bed: 4W21C/4W21C-01 Payor Info: Payor: Advertising copywriter MEDICARE / Plan: [facility] MEDICARE / Product Type: *No Product type* /    Admit Date/Time:  08/16/2019  3:38 PM  Primary Diagnosis:  Brainstem infarct, acute [facility])  Patient Active Problem List   Diagnosis Date Noted  . Brainstem infarct, acute (HCC) 08/18/2019  . Resolved ischemic cerebrovascular accident (CVA) involving left anterior cerebral artery territory in remote past 08/16/2019  . Benign essential HTN   . Acute ischemic stroke (HCC)   . History of CVA (cerebrovascular accident)   . History of pulmonary embolism   . Tobacco abuse   . Essential hypertension   . Obesity (BMI 30.0-34.9) 08/10/2019  . Cerebral infarction (HCC)   . CVA (cerebral vascular accident) (HCC) 06/06/2018  . Antiphospholipid syndrome (HCC) 06/06/2018  . Diabetes mellitus type 2, noninsulin dependent (HCC) 06/06/2018  . Hypercholesterolemia with hypertriglyceridemia 01/23/2016  . Impaired fasting glucose 01/23/2016  . B12 deficiency 01/23/2016  . Hypothyroid 01/23/2016  . Cognitive dysfunction 01/23/2016  . Pleuritic chest pain   . Leg DVT (deep venous thromboembolism), acute (HCC)   . Near syncope   . Elevated BP   . History of pulmonary embolus (PE)   . Pulmonary embolism (HCC) 12/24/2014    Expected Discharge Date: Expected Discharge Date: (SNF recommended)  Team Members Present: Physician leading conference: Dr. Claudette Laws Care Coodinator Present: Cheyenne Adas, RN, BSN, CRRN;Christina Vita Barley, BSW Nurse Present: Willey Blade, RN PT Present: Woodfin Ganja, PT OT Present: Perrin Maltese, OT SLP Present: Suzzette Righter, CF-SLP PPS Coordinator present : Fae Pippin, SLP  Nurse Present: Mosetta Pigeon, RN     Current  Status/Progress Goal Weekly Team Focus  Bowel/Bladder   Pt is incontinent of b/b, LBM 08/28/19  To regain continence.  Timed toileting/ PRN   Swallow/Nutrition/ Hydration   full supervision due to cognition, no dysphagia         ADL's   Supervision for UB selfcare, mod to max for LB selfcare and transfers with use of the RW.  Decreases problem solving and sequencing for bathing.  He needs max instructional cueing to complete  min assist overall  selfcare retraining, balance retraining, transfer training, DME education, cognitive retraining, DME education, pt/family education   Mobility   min assist supine<>sit, min/mod assist sit<>stand using RW, mod/max assist stand pivot using RW, mod/max assist ambulating up to 22ft using RW (no longer requiring +2 w/c follow)  supervision bed mobility, CGA transfers, min assist ambulation and stairs  activity tolerance, sitting balance/trunk control, standing balance, functional transfer training, B LE strengthening, gait training, awareness/initiaiton/motor planning of mobility tasks   Communication   Min A  Min A  carryover word-finding, ability to express wants and needs, verbal initiation   Safety/Cognition/ Behavioral Observations  Mod Max A  Min-Mod A (some goals to be downgraded)  recall with aids, basic familira problem solving, intellectual awareness, attention   Pain   Pt denies pain at this  time.  To remain pain free.  Pt will remain pain free   Skin   MASD to groin  To keep patient dry and free of further breakdown  Prevent pt from further breakdown/infection.    Rehab Goals Patient on target to meet rehab  goals: Yes Rehab Goals Revised: on target with goals *See Care Plan and progress notes for long and short-term goals.     Barriers to Discharge  Current Status/Progress Possible Resolutions Date Resolved   Nursing                  PT                    OT                  SLP                Care Coordinator Lack of/limited  family support              Discharge Planning/Teaching Needs:  Plans to discharge home. Family looking into SNF and [facility] options  If schedule if recommended   Team Discussion:  Ritalin trial without much change in attention span. Incontinent of B+B, toileting protocol recommended. Patient with poor progress due to fatigue, and function fluctuates with poor carry over and poor attention. Impulsive, right knee buckles with loss of balance during ambulation/transfers; requires step by step cues for activities.  Revisions to Treatment Plan:  Team downgraded goals to moderate assistance overall and changed recommendation to SNF    Medical Summary Current Status: Initiation and attention reduced on current Ritalin trial Weekly Focus/Goal: Monitor response to increased dose of Ritalin    Barriers to Discharge Comments: Stroke related abulia Possible Resolutions to Barriers: See above   Continued Need for Acute Rehabilitation Level of Care: The patient requires daily medical management by a physician with specialized training in physical medicine and rehabilitation for the following reasons: Direction of a multidisciplinary physical rehabilitation program to maximize functional independence : Yes Medical management of patient stability for increased activity during participation in an intensive rehabilitation regime.: Yes Analysis of laboratory values and/or radiology reports with any subsequent need for medication adjustment and/or medical intervention. : Yes   I attest that I was present, lead the team conference, and concur with the assessment and plan of the team.   Dorien Chihuahua B 08/30/2019, 2:04 PM

## 2019-08-30 NOTE — [place] FL2 (Signed)
[place] MEDICAID FL2 LEVEL OF CARE SCREENING TOOL     IDENTIFICATION  Patient Name: Roger Ramirez Birthdate: Dec 13, 1945 Sex: male Admission Date (Current Location): 08/16/2019  [facility] [place] Number:  Producer, television/film/video and Address:  [facility] [place], [address] [place], [place] [address]      Provider Number:    Attending Physician Name and Address:  Erick Colace, MD  Relative Name and Phone Number:  Lizabeth Leyden    Current Level of Care: Hospital Recommended Level of Care: Nursing Facility, Skilled Nursing Facility Prior Approval Number:    Date Approved/Denied:   PASRR Number: [id number]  Discharge Plan: SNF    Current Diagnoses: Patient Active Problem List   Diagnosis Date Noted  . Brainstem infarct, acute (HCC) 08/18/2019  . Resolved ischemic cerebrovascular accident (CVA) involving left anterior cerebral artery territory in remote past 08/16/2019  . Benign essential HTN   . Acute ischemic stroke (HCC)   . History of CVA (cerebrovascular accident)   . History of pulmonary embolism   . Tobacco abuse   . Essential hypertension   . Obesity (BMI 30.0-34.9) 08/10/2019  . Cerebral infarction (HCC)   . CVA (cerebral vascular accident) (HCC) 06/06/2018  . Antiphospholipid syndrome (HCC) 06/06/2018  . Diabetes mellitus type 2, noninsulin dependent (HCC) 06/06/2018  . Hypercholesterolemia with hypertriglyceridemia 01/23/2016  . Impaired fasting glucose 01/23/2016  . B12 deficiency 01/23/2016  . Hypothyroid 01/23/2016  . Cognitive dysfunction 01/23/2016  . Pleuritic chest pain   . Leg DVT (deep venous thromboembolism), acute (HCC)   . Near syncope   . Elevated BP   . History of pulmonary embolus (PE)   . Pulmonary embolism (HCC) 12/24/2014    Orientation RESPIRATION BLADDER Height & Weight     Self, Situation, Place  Normal Incontinent Weight: 192 lb 10.9 oz (87.4 kg) Height:  5\' 9"  (175.3 cm)  BEHAVIORAL  SYMPTOMS/MOOD NEUROLOGICAL BOWEL NUTRITION STATUS      Incontinent Diet  AMBULATORY STATUS COMMUNICATION OF NEEDS Skin   Supervision Verbally Normal                       Personal Care Assistance Level of Assistance  Bathing, Feeding, Dressing Bathing Assistance: Limited assistance Feeding assistance: Limited assistance Dressing Assistance: Limited assistance     Functional Limitations Info             SPECIAL CARE FACTORS FREQUENCY                       Contractures Contractures Info: Not present    Additional Factors Info                  Current Medications (08/30/2019):  This is the current hospital active medication list Current Facility-Administered Medications  Medication Dose Route Frequency Provider Last Rate Last Admin  . acetaminophen (TYLENOL) tablet 650 mg  650 mg Oral Q6H PRN Angiulli, 09/01/2019, PA-C       Or  . acetaminophen (TYLENOL) suppository 650 mg  650 mg Rectal Q6H PRN Angiulli, Mcarthur Rossetti, PA-C      . apixaban (ELIQUIS) tablet 5 mg  5 mg Oral BID AngiulliMcarthur Rossetti, PA-C   5 mg at 08/30/19 0743  . atorvastatin (LIPITOR) tablet 40 mg  40 mg Oral Daily 09/01/19, PA-C   40 mg at 08/30/19 0743  . bisacodyl (DULCOLAX) EC tablet 5 mg  5 mg Oral Daily  PRN Cathlyn Parsons, PA-C      . docusate sodium (COLACE) capsule 100 mg  100 mg Oral BID Cathlyn Parsons, PA-C   100 mg at 08/30/19 0743  . feeding supplement (ENSURE ENLIVE) (ENSURE ENLIVE) liquid 237 mL  237 mL Oral BID BM Angiulli, Lavon Paganini, PA-C   237 mL at 08/30/19 1351  . HYDROcodone-acetaminophen (NORCO/VICODIN) 5-325 MG per tablet 1-2 tablet  1-2 tablet Oral Q4H PRN Cathlyn Parsons, PA-C   1 tablet at 08/17/19 2116  . insulin aspart (novoLOG) injection 0-15 Units  0-15 Units Subcutaneous TID WC AngiulliLavon Paganini, PA-C   1 Units at 08/30/19 1229  . irbesartan (AVAPRO) tablet 75 mg  75 mg Oral Daily Cathlyn Parsons, PA-C   75 mg at 08/30/19 0745  . levothyroxine  (SYNTHROID) tablet 75 mcg  75 mcg Oral Daily Cathlyn Parsons, PA-C   75 mcg at 08/30/19 8786  . melatonin tablet 3 mg  3 mg Oral QHS AngiulliLavon Paganini, PA-C   3 mg at 08/29/19 7672  . multivitamin with minerals tablet 1 tablet  1 tablet Oral Daily Cathlyn Parsons, PA-C   1 tablet at 08/30/19 0743  . ondansetron (ZOFRAN) tablet 4 mg  4 mg Oral Q6H PRN Angiulli, Lavon Paganini, PA-C       Or  . ondansetron [facility]) injection 4 mg  4 mg Intravenous Q6H PRN Angiulli, Lavon Paganini, PA-C      . polyethylene glycol (MIRALAX / GLYCOLAX) packet 17 g  17 g Oral Daily PRN Cathlyn Parsons, PA-C   17 g at 08/27/19 2005  . sorbitol 70 % solution 30 mL  30 mL Oral Daily PRN Cathlyn Parsons, PA-C   30 mL at 08/28/19 1714     Discharge Medications: Please see discharge summary for a list of discharge medications.  Relevant Imaging Results:  Relevant Lab Results:   Additional Information    Dyanne Iha

## 2019-08-30 NOTE — Progress Notes (Signed)
[facility] PHYSICAL MEDICINE & REHABILITATION PROGRESS NOTE   Subjective/Complaints:   ROS: Patient denies fever, rash, sore throat, blurred vision, nausea, vomiting, diarrhea, cough, shortness of breath or chest pain, joint or back pain, headache, or mood change.      Objective:   No results found. No results for input(s): WBC, HGB, HCT, PLT in the last 72 hours. No results for input(s): NA, K, CL, CO2, GLUCOSE, BUN, CREATININE, CALCIUM in the last 72 hours.  Intake/Output Summary (Last 24 hours) at 08/30/2019 0909 Last data filed at 08/29/2019 1800 Gross per 24 hour  Intake 600 ml  Output --  Net 600 ml     Physical Exam: Vital Signs Blood pressure 132/65, pulse (!) 57, temperature 97.6 F (36.4 C), resp. rate 18, height _0  (1.753 m), weight 87.4 kg, SpO2 98 %. Constitutional: No distress . Vital signs reviewed. Working with SLP HEENT: EOMI, oral membranes moist Neck: supple Cardiovascular: RRR without murmur. No JVD    Respiratory/Chest: CTA Bilaterally without wheezes or rales. Normal effort    GI/Abdomen: BS +, non-tender, non-distended Ext: no clubbing, cyanosis, or edema Psych: pleasant and cooperative Tone MAS 2/4 in Right hamstrings  Neurologic: slow processing. Follows basic commands. Cranial nerves II through XII intact, motor strength is 5/5 in Bilateral  deltoid, bicep, tricep, grip, 5/5 left and 3/5 Right hip flexor, knee extensors, ankle dorsiflexor and plantar flexor Functional mobility: posterior lean in standing Musculoskeletal: Full range of motion in all 4 extremities. No joint swelling  Assessment/Plan: 1. Functional deficits secondary to left ACA infarct which require 3+ hours per day of interdisciplinary therapy in a comprehensive inpatient rehab setting.  Physiatrist is providing close team supervision and 24 hour management of active medical problems listed below.  Physiatrist and rehab team continue to assess barriers to discharge/monitor  patient progress toward functional and medical goals  Care Tool:  Bathing  Bathing activity did not occur: Safety/medical concerns Body parts bathed by patient: Right arm, Left arm, Chest, Abdomen, Front perineal area, Right upper leg, Left upper leg, Right lower leg, Left lower leg, Face   Body parts bathed by helper: Buttocks     Bathing assist Assist Level: Moderate Assistance - Patient 50 - 74%     Upper Body Dressing/Undressing Upper body dressing   What is the patient wearing?: Pull over shirt    Upper body assist Assist Level: Supervision/Verbal cueing    Lower Body Dressing/Undressing Lower body dressing      What is the patient wearing?: Pants, Incontinence brief     Lower body assist Assist for lower body dressing: Maximal Assistance - Patient 25 - 49%     Toileting Toileting    Toileting assist Assist for toileting: Maximal Assistance - Patient 25 - 49%     Transfers Chair/bed transfer  Transfers assist  Chair/bed transfer activity did not occur: Safety/medical concerns  Chair/bed transfer assist level: Moderate Assistance - Patient 50 - 74%     Locomotion Ambulation   Ambulation assist   Ambulation activity did not occur: Safety/medical concerns  Assist level: Moderate Assistance - Patient 50 - 74% Assistive device: Walker-rolling Max distance: 68f   Walk 10 feet activity   Assist  Walk 10 feet activity did not occur: Safety/medical concerns  Assist level: Moderate Assistance - Patient - 50 - 74% Assistive device: Walker-rolling   Walk 50 feet activity   Assist Walk 50 feet with 2 turns activity did not occur: Safety/medical concerns  Walk 150 feet activity   Assist Walk 150 feet activity did not occur: Safety/medical concerns         Walk 10 feet on uneven surface  activity   Assist Walk 10 feet on uneven surfaces activity did not occur: Safety/medical concerns         Wheelchair     Assist Will  patient use wheelchair at discharge?: (TBD)             Wheelchair 50 feet with 2 turns activity    Assist            Wheelchair 150 feet activity     Assist          Blood pressure 132/65, pulse (!) 57, temperature 97.6 F (36.4 C), resp. rate 18, height _0  (1.753 m), weight 87.4 kg, SpO2 98 %.  Medical Problem List and Plan: 1.  Right side weakness with dysarthria secondary to left ACA territory acute infarction as well as history of CVA 2020 with cognitive deficits Cognition and RLE weakness are main issues              -patient may shower             -ELOS/Goals: 16-19 days/min/Mod a  Continue CIR PT, OT, SLP Team conference today please see physician documentation under team conference tab, met with team  to discuss problems,progress, and goals. Formulized individual treatment plan based on medical history, underlying problem and comorbidities.  2.  Antithrombotics: -DVT/anticoagulation: Eliquis             -antiplatelet therapy: N/A 3. Pain Management: Tylenol as needed. Well controlled 4. Mood: Melatonin 3 mg nightly             -antipsychotic agents: N/A 5. Neuropsych: This patient is capable of making decisions on his own behalf. Poor concentration attention, initiation , as discussed with pt , methylphenidate trial- increased to 77m--discuss with therapy in conf today    6. Skin/Wound Care: Routine skin checks 7. Fluids/Electrolytes/Nutrition: Routine in and outs. CMP ordered. 8.  History of saddle pulmonary emboli.  Continue Eliquis 9.  Diabetes mellitus.  Hemoglobin A1c 6.1.  SSI.  Patient on Glucophage 1000 mg twice daily prior to admission.  Resume as needed CBG (last 3)  Recent Labs    08/29/19 1646 08/29/19 2113 08/30/19 0614  GLUCAP 113* 139* 102*         reasonable control 5/18 at 115 10.  Hypertension.  Avapro 75 mg daily.           Vitals:   08/29/19 2012 08/30/19 0420  BP: 120/74 132/65  Pulse: 78 (!) 57  Resp: 18 18   Temp: 97.7 F (36.5 C) 97.6 F (36.4 C)  SpO2: 93% 98%   Good control 5/19 11.  Hypothyroidism. Continue Synthroid 12.  Hyperlipidemia. Lipitor 13.  History of tobacco abuse.  Counseling    LOS: 14 days A FACE TO FACE EVALUATION WAS PERFORMED  ACharlett Blake5/19/2021, 9:09 AM

## 2019-08-30 NOTE — Progress Notes (Signed)
Occupational Therapy Session Note  Patient Details  Name: Roger Ramirez MRN: [id number] Date of Birth: 11/24/1945  Today's Date: 08/30/2019 OT Individual Time: 1829-9371 OT Individual Time Calculation (min): 19 min    Short Term Goals: Week 2:  OT Short Term Goal 1 (Week 2): Pt will complete LB bathing sit to stand with min assist. OT Short Term Goal 2 (Week 2): Pt will complete LB dressing sit to stand with min assist. OT Short Term Goal 3 (Week 2): Pt will complete toilet transfers with min assist stand pivot to the 3:1. OT Short Term Goal 4 (Week 2): Pt will demonstrate intellectual awareness of physical deficits with no more than min questioning cueing.  Skilled Therapeutic Interventions/Progress Updates:    Session 1: [phone]).  Pt seen for limited OT session.  He was taken down to the therapy gym where he completed 8 mins of BUE strengthening using the ergonometer.  He completed 5 mins peddling forward on level 7 resistance with RPMs at 20-22.  After a brief rest break, he completed 3 mins of peddling in reverse.  Returned to the room at the end of the session with pt staying up in the wheelchair with safety belt in place and call button and phone in reach.   Session2:  (1017-5102)  Pt in bed to start session finishing his lunch.  He was able to state day of the week, month, and reason for hospitalization.  He does exhibit very little awareness of his current deficits related to balance and mobility.  When asked if he felt he could get up and walk to the door by himself, he stated "yea probably".  Had pt transfer to the EOB with min assist and max instructional cueing for technique.  He then completed sit to stand with mod assist secondary to posterior LOB.  When standing, he reported needing to go to the bathroom so had him ambulate with the RW and mod assist.  He needed the same amount of assist for clothing management as well as toilet hygiene sit to stand with use of the RW for  support.  He transferred out to the wheelchair for washing his hands at the sink with mod assist as well.  Noted pt demonstrates short step length on the right side resulting in frequent LOB to the right when ambulating.  Took him down to the therapy gym where he worked on standing balance while engaged in use of the [employer].  He needed mod assist for balance while using primarily the RUE to press out the red lights.  He averaged 2.7-2.3 seconds for 1 minute intervals.  Mod instructional cueing is still needed for hand placement with sit to stand and stand to sit transitions.  Finished session with return to the room and pt staying up in the wheelchair with call button and phone in reach and safety belt in place.   Therapy Documentation Precautions:  Precautions Precautions: Fall Precaution Comments: posterior and right lean in standing Restrictions Weight Bearing Restrictions: No  Pain: Pain Assessment Pain Scale: Faces Pain Score: 0-No pain ADL: See Care Tool Section for some details of mobility and selfcare  Therapy/Group: Individual Therapy  Trivia Heffelfinger OTR/L 08/30/2019, 10:49 AM

## 2019-08-30 NOTE — Progress Notes (Signed)
Physical Therapy Session Note  Patient Details  Name: Roger Ramirez MRN: [id number] Date of Birth: 07-31-1945  Today's Date: 08/30/2019 PT Individual Time: 0802-0900 and 3500-9381 PT Individual Time Calculation (min): 58 min and 40 min    Short Term Goals: Week 2:  PT Short Term Goal 1 (Week 2): Pt will perform supine<>sit with min assist PT Short Term Goal 2 (Week 2): Pt will perform sit<>stand with min assist PT Short Term Goal 3 (Week 2): Pt will perform bed<>chair transfer with min assist PT Short Term Goal 4 (Week 2): Pt will ambulate at least 15ft using LRAD with mod assist of 1  Skilled Therapeutic Interventions/Progress Updates:    Session 1: Pt supine in bed upon PT arrival, agreeable to therapy tx and denies pain. Pt reports that he does not have to go to the bathroom, therapist encouraged pt to try going to the bathroom this morning for timed toileting - pt agreeable. Pt transferred to sitting with min assist using bedrails, max cues for techniques and increased time. Sit<>stand within stedy min assist, dependent toilet transfer this session with use of stedy. Standing balance min assist while therapist performed clothing management and pericare this session, pt continent of bowel. Pt transferred to the sink using the stedy. Standing at the sink, within the stedy, pt worked on standing balance min assist while washing hands and then brushing teeth. Pt seated on elevated stedy seat worked on upper body bathing with assist from therapist, cues to correct R lean. Therapist assisted pt to don shirt, pants, sit<>stand within stedy min assist to pull pants over hips. Pt transferred to w/c with stedy and then transported to the gym in w/c. Pt worked on Investment banker, operational this session with use of RW for UE support, initially mod assist progressing to min assist for gait, pt ambulated 2 x 38 ft and 2 x 45 ft this session with cues for increased step length, cues for R foot clearance, cues for foot  placement/RW management during turn to sit. Pt transported back to room and left in w/c with needs in reach and chair alarm set.   Session 2: Pt seated in w/c upon PT arrival, trying to take of his safety belt and saying he wants to get to bed. Therapist reinforced that he needs to use his call bell when he wants to go to bed. Therapist explained that it is time for therapy and informed patient that this therapist would help him to bed at the end of the session, pt agreeable. Pt denies pain, transported to the gym in w/c. Pt ambulated x 40 ft and x 32 ft this session with RW and min-mod assist working on gait training, cues for increased step length, foot placement and RW management. Pt worked on stair negotiation this session with use of single rail and mod assist- ascended/descended 4 steps x 1 using L handrail for B UE support and then x 1 using B handrails, step to pattern, significantly increased work of breathing noted - SpO2 97% and HR 90 bpm. Pt transported back to room and performed stand pivot to the bed with mod assist. Sit>supine with mod assist. Pt left supine with needs in reach and bed alarm set.    Therapy Documentation Precautions:  Precautions Precautions: Fall Precaution Comments: posterior and right lean in standing Restrictions Weight Bearing Restrictions: No    Therapy/Group: Individual Therapy  Cresenciano Genre, PT, DPT, CSRS 08/30/2019, 7:54 AM

## 2019-08-30 NOTE — Progress Notes (Signed)
Team Conference Report to Patient/Family  Team Conference discussion was reviewed with the patient and caregiver, including goals, any changes in plan of care and target discharge date.  Patient and caregiver express understanding and are in agreement.  The patient has a target discharge date of (SNF recommended).  [name] 08/30/2019, 2:53 PM Patient ID: Roger Ramirez, male   DOB: 1945-07-22, 74 y.o.   MRN: [id number]

## 2019-08-31 ENCOUNTER — Inpatient Hospital Stay (HOSPITAL_COMMUNITY): Payer: Medicare Other | Admitting: Occupational Therapy

## 2019-08-31 ENCOUNTER — Inpatient Hospital Stay (HOSPITAL_COMMUNITY): Payer: Medicare Other | Admitting: Physical Therapy

## 2019-08-31 ENCOUNTER — Inpatient Hospital Stay (HOSPITAL_COMMUNITY): Payer: Medicare Other | Admitting: Speech Pathology

## 2019-08-31 LAB — GLUCOSE, CAPILLARY
Glucose-Capillary: 104 mg/dL — ABNORMAL HIGH (ref 70–99)
Glucose-Capillary: 155 mg/dL — ABNORMAL HIGH (ref 70–99)
Glucose-Capillary: 133 mg/dL — ABNORMAL HIGH (ref 70–99)
Glucose-Capillary: 124 mg/dL — ABNORMAL HIGH (ref 70–99)

## 2019-08-31 NOTE — Progress Notes (Addendum)
Occupational Therapy Session Note  Patient Details  Name: Roger Ramirez MRN: [id number] Date of Birth: 12/08/1945  Today's Date: 08/31/2019 OT Individual Time: 4193-7902 OT Individual Time Calculation (min): 57 min    Short Term Goals: Week 2:  OT Short Term Goal 1 (Week 2): Pt will complete LB bathing sit to stand with min assist. OT Short Term Goal 2 (Week 2): Pt will complete LB dressing sit to stand with min assist. OT Short Term Goal 3 (Week 2): Pt will complete toilet transfers with min assist stand pivot to the 3:1. OT Short Term Goal 4 (Week 2): Pt will demonstrate intellectual awareness of physical deficits with no more than min questioning cueing.  Skilled Therapeutic Interventions/Progress Updates:    Session 1: [phone]) Pt in bed to start session, worked on transfer to sitting secondary to stating he needed to use the bathroom.  He needed min assist with mod instructional cueing for transition to sitting.  Mod assist was needed for ambulation to the bathroom in order to attempt use of the toilet.  He needed max instructional cueing for taking larger steps on the right secondary to short step length and decreased left weightshift.  He was able to urinate slightly, but already exhibited significant bladder incontinence in his brief.  He was able to then transfer out to the wheelchair at the sink with mod assist using the RW.  Had him work on bathing sit to stand at the sink.  Max instructional cueing needed to sequence task and for thoroughness.  Mod assist for sit to stand and standing balance while washing peri area and buttocks as well as pulling brief and pants over his hips.  He was able to donn his pullover shirt with setup and needed min assist for donning the gripper socks.  Finished session with work on standing balance with use of the RW.  Pt progressed to being able to stand with min assist, but maintains flexed trunk and head.  If he tries to correct posture with cervical  extension he losses his balance posteriorly and is not able to correct it.  Pt left in the wheelchair at the end of the session with call button and phone in reach and safety belt in place.  Pt oriented to place and month, but not day of the week this session.    Session 2: ([phone])  Pt completed supine to sit EOB with min assist to begin session.  He then ambulated to the bathroom with mod assist using the RW for support in order to change his brief and attempt to urinate.  He needed mod assist for pulling down clothing with max assist to donn new brief and pull up his pants. Mod assist for transfer out of the bathroom to the wheelchair.  He continues to demonstrate decreased step length on the right with LOB to the right during mobility.  Took pt down to the gym where he worked on standing balance at the high/low table while engaged in Doctor, general practice activity.  He needed min assist to maintain static standing balance with use of the table for support.  One posterior LOB noted.  He completed standing 3 standing intervals of between 3-6 mins.  Finished session with return to the room and transfer back to the bed with mod assist.  Call button and phone in reach with safety alarm in place.    Therapy Documentation Precautions:  Precautions Precautions: Fall Precaution Comments: posterior and right lean in standing Restrictions Weight Bearing  Restrictions: No   Pain: Pain Assessment Pain Scale: 0-10 Pain Score: 0-No pain ADL: See Care Tool Section for some details of mobility and selfcare  Therapy/Group: Individual Therapy  Danae Oland OTR/L 08/31/2019, 12:10 PM

## 2019-08-31 NOTE — Progress Notes (Signed)
Patient ID: Roger Ramirez, male   DOB: 1945-10-05, 74 y.o.   MRN: [id number] Discussed with nursing toileting protocol for incontinence and cognitive issues, poor attention and carryover may not be able to ask for assistance. Nursing confirmed understanding of need for toileting protocol and encourage toileting to address incontinence.

## 2019-08-31 NOTE — Progress Notes (Signed)
Physical Therapy Session Note  Patient Details  Name: Roger Ramirez MRN: [id number] Date of Birth: 1945-12-15  Today's Date: 08/31/2019 PT Individual Time: 1307-1401 PT Individual Time Calculation (min): 54 min   Short Term Goals: Week 2:  PT Short Term Goal 1 (Week 2): Pt will perform supine<>sit with min assist PT Short Term Goal 2 (Week 2): Pt will perform sit<>stand with min assist PT Short Term Goal 3 (Week 2): Pt will perform bed<>chair transfer with min assist PT Short Term Goal 4 (Week 2): Pt will ambulate at least 9ft using LRAD with mod assist of 1  Skilled Therapeutic Interventions/Progress Updates:   Pt received with NT present as pt requesting to return to bed; despite this pt agreeable to therapy session. NT reports pt not toileted recently therefore therapist assisted pt to/from bathroom in stedy for timed toileting. Sit<>stands to/from stedy seat, w/c, and toilet all with CGA/min assist for lifting/lowering. Standing with CGA in stedy performed LB clothing management total assist. Pt unable to void - RN and NT notified.  Transported to/from gym in w/c for time management and energy conservation. Gait training ~61ft using RW with mod assist progressed to max assist at end when turning to sit in w/c due to fatigue and poor motor planning and LE stepping when turning - continues to have decreased L lateral weight shift during stance with associated decreased R LE foot clearance and step length resulting in shuffled steps - manual facilitation for improvement. Pt has labored breathing during this - assessed SpO2 98% and HR 86bpm.  Performed the following gait training trials: - 33ft using RW with 7lb ankle  weight on R LE - pt demonstrated increased R LE foot clearance and step length with weight due to it drawing increased attention to that side but with fatigue this was not effective - 8ft using RW with 7lb ankle weight on R LE and now with blue theraband tied around RW as visual  target for increased step length - pt demonstrating improving gait mechanics with more reciprocal stepping pattern, increasing R LE step length and foot clearance, with increased L stance time - had to remove ankle weight on 2nd half of walk due to increasing pt fatigue - HR elevated to 95bpm; seated rest break R LE NMR via repeated R foot taps on 2nd step of stairs using B HRs with 7lb weight on ankle and CGA for safety x10 reps. Lateral side stepping in // bars with BUE support and 7lb ankle weight in R LE and CGA for steadying - cuing for technique targeting hip abductors. Transported back to room and pt requesting to return to bed. R stand pivot using UE support and bedrail and mod assist for balance due to posterior lean. Sit>supine with close supervision and pt using bed features.   Therapy Documentation Precautions:  Precautions Precautions: Fall Precaution Comments: posterior and right lean in standing Restrictions Weight Bearing Restrictions: No  Pain: Denies pain during session.   Therapy/Group: Individual Therapy  Ginny Forth, PT, DPT 08/31/2019, 1:03 PM

## 2019-08-31 NOTE — Plan of Care (Addendum)
  Problem: RH Balance Goal: LTG Patient will maintain dynamic standing with ADLs (OT) Description: LTG:  Patient will maintain dynamic standing balance with assist during activities of daily living (OT)  Flowsheets (Taken 08/31/2019 1217) LTG: Pt will maintain dynamic standing balance during ADLs with: (goal downgraded based on slower progress than expected) Moderate Assistance - Patient 50 - 74% Note: goal downgraded based on slower progress than expected   Problem: Sit to Stand Goal: LTG:  Patient will perform sit to stand in prep for activites of daily living with assistance level (OT) Description: LTG:  Patient will perform sit to stand in prep for activites of daily living with assistance level (OT) Flowsheets (Taken 08/31/2019 1218) LTG: PT will perform sit to stand in prep for activites of daily living with assistance level: (goal downgraded based on slower progress than expected) Moderate Assistance - Patient 50 - 74% Note: goal downgraded based on slower progress than expected   Problem: RH Bathing Goal: LTG Patient will bathe all body parts with assist levels (OT) Description: LTG: Patient will bathe all body parts with assist levels (OT) Flowsheets (Taken 08/31/2019 1218) LTG: Pt will perform bathing with assistance level/cueing: (goal downgraded based on slower progress than expected) Moderate Assistance - Patient 50 - 74% Note: goal downgraded based on slower progress than expected   Problem: RH Dressing Goal: LTG Patient will perform lower body dressing w/assist (OT) Description: LTG: Patient will perform lower body dressing with assist, with/without cues in positioning using equipment (OT) Flowsheets (Taken 08/31/2019 1218) LTG: Pt will perform lower body dressing with assistance level of: (goal downgraded based on slower progress than expected) Moderate Assistance - Patient 50 - 74% Note: goal downgraded based on slower progress than expected   Problem: RH Toileting Goal: LTG  Patient will perform toileting task (3/3 steps) with assistance level (OT) Description: LTG: Patient will perform toileting task (3/3 steps) with assistance level (OT)  Flowsheets (Taken 08/31/2019 1218) LTG: Pt will perform toileting task (3/3 steps) with assistance level: (goal downgraded based on slower progress than expected) Moderate Assistance - Patient 50 - 74% Note: goal downgraded based on slower progress than expected   Problem: RH Toilet Transfers Goal: LTG Patient will perform toilet transfers w/assist (OT) Description: LTG: Patient will perform toilet transfers with assist, with/without cues using equipment (OT) Flowsheets (Taken 08/31/2019 1218) LTG: Pt will perform toilet transfers with assistance level of: (goal downgraded based on slower progress than expected) Moderate Assistance - Patient 50 - 74% Note: goal downgraded based on slower progress than expected   Problem: RH Tub/Shower Transfers Goal: LTG Patient will perform tub/shower transfers w/assist (OT) Description: LTG: Patient will perform tub/shower transfers with assist, with/without cues using equipment (OT) Flowsheets (Taken 08/31/2019 1218) LTG: Pt will perform tub/shower stall transfers with assistance level of: (goal downgraded based on slower progress than expected) Moderate Assistance - Patient 50 - 74% LTG: Pt will perform tub/shower transfers from: Walk in shower Note: goal downgraded based on slower progress than expected

## 2019-08-31 NOTE — Progress Notes (Signed)
[facility] PHYSICAL MEDICINE & REHABILITATION PROGRESS NOTE   Subjective/Complaints:  Methylphenidate not making much impact in PT, or OT ,  ROS: Patient denies CP, SOB, N/V/D     Objective:   No results found. No results for input(s): WBC, HGB, HCT, PLT in the last 72 hours. No results for input(s): NA, K, CL, CO2, GLUCOSE, BUN, CREATININE, CALCIUM in the last 72 hours.  Intake/Output Summary (Last 24 hours) at 08/31/2019 0828 Last data filed at 08/30/2019 1900 Gross per 24 hour  Intake 600 ml  Output --  Net 600 ml     Physical Exam: Vital Signs Blood pressure 125/69, pulse 76, temperature 98 F (36.7 C), resp. rate 14, height 5\' 9"  (1.753 m), weight 87.4 kg, SpO2 98 %. Constitutional: No distress . Vital signs reviewed. Working with SLP HEENT: EOMI, oral membranes moist Neck: supple Cardiovascular: RRR without murmur. No JVD    Respiratory/Chest: CTA Bilaterally without wheezes or rales. Normal effort    GI/Abdomen: BS +, non-tender, non-distended Ext: no clubbing, cyanosis, or edema Psych: pleasant and cooperative Tone MAS 2/4 in Right hamstrings  Neurologic: slow processing. Follows basic commands. Cranial nerves II through XII intact, motor strength is 5/5 in Bilateral  deltoid, bicep, tricep, grip, 5/5 left and 3/5 Right hip flexor, knee extensors, ankle dorsiflexor and plantar flexor Functional mobility: posterior lean in standing Musculoskeletal: Full range of motion in all 4 extremities. No joint swelling  Assessment/Plan: 1. Functional deficits secondary to left ACA infarct which require 3+ hours per day of interdisciplinary therapy in a comprehensive inpatient rehab setting.  Physiatrist is providing close team supervision and 24 hour management of active medical problems listed below.  Physiatrist and rehab team continue to assess barriers to discharge/monitor patient progress toward functional and medical goals  Care Tool:  Bathing  Bathing activity did  not occur: Safety/medical concerns Body parts bathed by patient: Right arm, Left arm, Chest, Abdomen, Front perineal area, Right upper leg, Left upper leg, Right lower leg, Left lower leg, Face   Body parts bathed by helper: Buttocks     Bathing assist Assist Level: Moderate Assistance - Patient 50 - 74%     Upper Body Dressing/Undressing Upper body dressing   What is the patient wearing?: Pull over shirt    Upper body assist Assist Level: Supervision/Verbal cueing    Lower Body Dressing/Undressing Lower body dressing      What is the patient wearing?: Pants, Incontinence brief     Lower body assist Assist for lower body dressing: Maximal Assistance - Patient 25 - 49%     Toileting Toileting    Toileting assist Assist for toileting: Maximal Assistance - Patient 25 - 49%     Transfers Chair/bed transfer  Transfers assist  Chair/bed transfer activity did not occur: Safety/medical concerns  Chair/bed transfer assist level: Moderate Assistance - Patient 50 - 74%     Locomotion Ambulation   Ambulation assist   Ambulation activity did not occur: Safety/medical concerns  Assist level: Moderate Assistance - Patient 50 - 74% Assistive device: Walker-rolling Max distance: 38 ft   Walk 10 feet activity   Assist  Walk 10 feet activity did not occur: Safety/medical concerns  Assist level: Moderate Assistance - Patient - 50 - 74% Assistive device: Walker-rolling   Walk 50 feet activity   Assist Walk 50 feet with 2 turns activity did not occur: Safety/medical concerns         Walk 150 feet activity   Assist Walk 150  feet activity did not occur: Safety/medical concerns         Walk 10 feet on uneven surface  activity   Assist Walk 10 feet on uneven surfaces activity did not occur: Safety/medical concerns         Wheelchair     Assist Will patient use wheelchair at discharge?: (TBD)             Wheelchair 50 feet with 2 turns  activity    Assist            Wheelchair 150 feet activity     Assist          Blood pressure 125/69, pulse 76, temperature 98 F (36.7 C), resp. rate 14, height 5\' 9"  (1.753 m), weight 87.4 kg, SpO2 98 %.  Medical Problem List and Plan: 1.  Right side weakness with dysarthria secondary to left ACA territory acute infarction as well as history of CVA 2020 with cognitive deficits Cognition and RLE weakness are main issues              -patient may shower             -plan is now SNF - is medically stable   Continue CIR PT, OT, SLP   2.  Antithrombotics: -DVT/anticoagulation: Eliquis             -antiplatelet therapy: N/A 3. Pain Management: Tylenol as needed. Well controlled 4. Mood: Melatonin 3 mg nightly             -antipsychotic agents: N/A 5. Neuropsych: This patient is capable of making decisions on his own behalf. Poor concentration attention, initiation , as discussed with pt , methylphenidate trial- increased to 10mg --discuss with therapy in conf today    6. Skin/Wound Care: Routine skin checks 7. Fluids/Electrolytes/Nutrition: Routine in and outs. CMP ordered. 8.  History of saddle pulmonary emboli.  Continue Eliquis 9.  Diabetes mellitus.  Hemoglobin A1c 6.1.  SSI.  Patient on Glucophage 1000 mg twice daily prior to admission.  Resume as needed CBG (last 3)  Recent Labs    08/30/19 1636 08/30/19 2057 08/31/19 0614  GLUCAP 132* 145* 104*         reasonable control 5/18 at 115 10.  Hypertension.  Avapro 75 mg daily.           Vitals:   08/30/19 2034 08/31/19 0443  BP: (!) 141/65 125/69  Pulse: 75 76  Resp: 16 14  Temp: 97.6 F (36.4 C) 98 F (36.7 C)  SpO2: 96% 98%   Good control 5/20 11.  Hypothyroidism. Continue Synthroid 12.  Hyperlipidemia. Lipitor 13.  History of tobacco abuse.  Counseling    LOS: 15 days A FACE TO FACE EVALUATION WAS PERFORMED  09/02/19 08/31/2019, 8:28 AM

## 2019-08-31 NOTE — Progress Notes (Signed)
Patient ID: Roger Ramirez, male   DOB: 04-10-1946, 74 y.o.   MRN: [id number]   Patient SNF referral sent out to: [facility], [facility], [facility], [address], [place], [facility] and [facility].

## 2019-08-31 NOTE — Progress Notes (Signed)
Speech Language Pathology Weekly Progress and Session Note  Patient Details  Name: Roger Ramirez MRN: [id number] Date of Birth: 1946-03-04  Beginning of progress report period: Aug 24, 2019 End of progress report period: Aug 31, 2019  Today's Date: 08/31/2019 SLP Individual Time: 2248-2500 SLP Individual Time Calculation (min): 30 min  Short Term Goals: Week 2: SLP Short Term Goal 1 (Week 2): Pt will recall 2 safety precaution with Max A multimodal cues. SLP Short Term Goal 1 - Progress (Week 2): Met SLP Short Term Goal 2 (Week 2): Pt will identify 2 cognitive and 2 physical impairment with Max A multimodal cues. SLP Short Term Goal 2 - Progress (Week 2): Met SLP Short Term Goal 3 (Week 2): Pt will use external aids to recall daily information with Mod A. SLP Short Term Goal 3 - Progress (Week 2): Progressing toward goal SLP Short Term Goal 4 (Week 2): Pt will demonstate ability to problem solve during basic and familiar tasks with Mod A. SLP Short Term Goal 4 - Progress (Week 2): Met SLP Short Term Goal 5 (Week 2): Pt will demonstrate use word finding strategies in functional communication exchanges with Mod I. SLP Short Term Goal 5 - Progress (Week 2): Progressing toward goal    New Short Term Goals: Week 3: SLP Short Term Goal 1 (Week 3): Pt will recall 2 safety precaution with Mod A multimodal cues. SLP Short Term Goal 2 (Week 3): Pt will use external aids to recall daily information with Mod A. SLP Short Term Goal 3 (Week 3): Pt will demonstrate use word finding strategies in functional communication exchanges with Mod I. SLP Short Term Goal 4 (Week 3): Pt will demonstate ability to problem solve during basic and familiar tasks with Min A. SLP Short Term Goal 5 (Week 3): Pt will identify 2 cognitive and 2 physical impairment with Mod A multimodal cues.  Weekly Progress Updates: Pt has made functional gains and met 3 out of 5 short term goals this reporting period. Pt is currently  Mod assist for basic cognitive tasks due to impairments impacting his safety and intellectual awareness, basic problem solving, short term memory and attention. Pt has demonstrated improved ability to identify physical and cognitive deficits with cues, as well as recall with aids and basic familiar problem solving. Pt education is ongoing but no family has been present for ST sessions. Family has determined most appropriate level of care will require SNF placement, therefore pt is currently awaiting a placement at SNF. Pt would continue to benefit from skilled ST while inpatient in order to maximize functional independence and reduce burden of care prior to discharge. Anticipate that pt will need 24/7 supervision at discharge in addition to [name] follow up at next level of care.       Intensity: Minumum of 1-2 x/day, 30 to 90 minutes Frequency: 3 to 5 out of 7 days Duration/Length of Stay: Pending SNF placement Treatment/Interventions: Cueing hierarchy;Functional tasks;Patient/family education;Therapeutic Activities;Internal/external aids;Cognitive remediation/compensation;Speech/Language facilitation;Environmental controls   Daily Session  Skilled Therapeutic Interventions: Pt was seen for skilled ST targeting cognition. Pt requesting to eat breakfast upon arrival. SLP facilitated session with interventions to maximize sustained attention to PO intake as well as functional basic problem solving. Pt able to most successfully sustain his attention to intake with SLP out of direct line of sight and with intermittent Min A verbal cues provided redirection and executing solutions to functional problems. Providing a time constraint was not particularly effective. Pt left sitting in  bed with alarm set and needs within reach, tray removed and NT aware pt would like to finish oatmeal. Continue per current plan of care.         Pain Pain Assessment Pain Score: 0-No pain  Therapy/Group: Individual  Therapy  Arbutus Leas 08/31/2019, 7:07 AM

## 2019-09-01 ENCOUNTER — Inpatient Hospital Stay (HOSPITAL_COMMUNITY): Payer: Medicare Other | Admitting: Physical Therapy

## 2019-09-01 ENCOUNTER — Inpatient Hospital Stay (HOSPITAL_COMMUNITY): Payer: Medicare Other | Admitting: Speech Pathology

## 2019-09-01 ENCOUNTER — Inpatient Hospital Stay (HOSPITAL_COMMUNITY): Payer: Medicare Other | Admitting: Occupational Therapy

## 2019-09-01 LAB — GLUCOSE, CAPILLARY
Glucose-Capillary: 97 mg/dL (ref 70–99)
Glucose-Capillary: 127 mg/dL — ABNORMAL HIGH (ref 70–99)
Glucose-Capillary: 147 mg/dL — ABNORMAL HIGH (ref 70–99)
Glucose-Capillary: 144 mg/dL — ABNORMAL HIGH (ref 70–99)

## 2019-09-01 NOTE — Progress Notes (Signed)
Occupational Therapy Weekly Progress Note  Patient Details  Name: Roger Ramirez MRN: [id number] Date of Birth: 1945-06-24  Beginning of progress report period: Aug 24, 2019 End of progress report period: Sep 01, 2019  Today's Date: 09/01/2019 OT Individual Time: 9563-8756 OT Individual Time Calculation (min): 46 min    Patient has met 0 of 4 short term goals.  Roger Ramirez is making slower progress than expected currently as his amount of assistance continues to fluctuate from day to day.  With UB selfcare he consistently completes this with supervision.  LB bathing is at a mod assist using a grab bar in the shower for support, with LB dressing at mod to max assist secondary to maintaining posterior lean as well as partial stand.  He continues to need max instructional cueing to sequence through bathing secondary to decreased cognitive processing.  Transfers have been variable from mod to max assist with use of the RW for support.  He continues to demonstrate decreased short term memory and relies on the use of a memory notebook for recall of daily therapy tasks.  Feel he will not have adequate assist at discharge secondary to his fluctuating progress and will need longer rehab at SNF level in order to hopefully achieve a level that his children can safety provide.  Will continue with current OT POC until discharge to SNF.  Goals have been downgraded secondary to pt demonstrating slower progress than originally expected.    Patient continues to demonstrate the following deficits: muscle weakness, impaired timing and sequencing, unbalanced muscle activation and decreased coordination, decreased attention, decreased awareness, decreased problem solving, decreased safety awareness, decreased memory and delayed processing and decreased sitting balance, decreased standing balance, decreased postural control and decreased balance strategies and therefore will continue to benefit from skilled OT intervention to  enhance overall performance with BADL and Reduce care partner burden.  Patient not progressing toward long term goals.  See goal revision..  Continue plan of care.  OT Short Term Goals Week 3:  OT Short Term Goal 1 (Week 3): Continue working on established LTGs set at [employer].  Skilled Therapeutic Interventions/Progress Updates:    Pt worked on Data processing manager session today.  He was able to ambulate to the shower seat with overall mod assist using the RW for support.  Mod demonstrational cueing for staying closer to the walker and maintaining upright posture.  Decreased step length noted in the RLE resulting in LOB to the right at times.  He needed max instructional cueing to sequence through bathing tasks with mod assist to stand for washing peri area and buttocks with use of the grab bar for support.  He needed mod assist for transfer out of the shower with supervision for donning a pullover shirt and mod assist for donning a new brief and pants.  He was able to complete donning socks with min assist and mod instructional cueing for crossing the RLE over the left knee.  Finished session with pt in the wheelchair and safety belt in place.  Call button and phone in reach as well.    Therapy Documentation Precautions:  Precautions Precautions: Fall Precaution Comments: posterior and right lean in standing Restrictions Weight Bearing Restrictions: No  Pain: Pain Assessment Pain Scale: 0-10 Pain Score: 0-No pain ADL: See Care Tool Section for some details of mobility and selfcare  Therapy/Group: Individual Therapy  Yeison Sippel OTR/L 09/01/2019, 4:10 PM

## 2019-09-01 NOTE — Progress Notes (Signed)
Speech Language Pathology Daily Session Note  Patient Details  Name: Corinthian Mizrahi MRN: [id number] Date of Birth: 01-12-1946  Today's Date: 09/01/2019 SLP Individual Time: 1505-1530 SLP Individual Time Calculation (min): 25 min  Short Term Goals: Week 3: SLP Short Term Goal 1 (Week 3): Pt will recall 2 safety precaution with Mod A multimodal cues. SLP Short Term Goal 2 (Week 3): Pt will use external aids to recall daily information with Mod A. SLP Short Term Goal 3 (Week 3): Pt will demonstrate use word finding strategies in functional communication exchanges with Mod I. SLP Short Term Goal 4 (Week 3): Pt will demonstate ability to problem solve during basic and familiar tasks with Min A. SLP Short Term Goal 5 (Week 3): Pt will identify 2 cognitive and 2 physical impairment with Mod A multimodal cues.  Skilled Therapeutic Interventions:  Pt was seen for skilled ST targeting cognitive goals.  SLP facilitated the session with a novel card game targeting problem solving and memory goals.  SLP provided pt with a written list of task rules and procedures to facilitate carryover within task.  Pt planned and executed a problem solving strategy within task with min assist verbal cues.  Pt was left in wheelchair with chair alarm set and call bell within reach.  Continue per current plan of care.    Pain Pain Assessment Pain Scale: 0-10 Pain Score: 0-No pain  Therapy/Group: Individual Therapy  Mishawn Didion, Melanee Spry 09/01/2019, 3:38 PM

## 2019-09-01 NOTE — Progress Notes (Signed)
Physical Therapy Weekly Progress Note  Patient Details  Name: Roger Ramirez MRN: [id number] Date of Birth: [birth year]  Beginning of progress report period: Aug 24, 2019 End of progress report period: Sep 01, 2019  Today's Date: 09/01/2019 PT Individual Time: 1137-1205 and 2111-5520 PT Individual Time Calculation (min): 28 min and 56 min  Patient has met 2 of 4 short term goals.  Roger Ramirez continues to demonstrate significant cognitive impairments impacting his mobility as well as poor trunk control, midline awareness, and R hemiparesis. He perform supine<>sit with min assist, continues to have poor trunk control in sitting with posterior LOB when fatigued, sit<>stands using RW with varying min/mod assist, stand pivot transfers using RW with varying min/mod assist, and ambulating up to 37f using RW with mod assist for balance and AD management. His level of assistance fluctuates with fatigue, attention to task, and time of day.  Patient continues to demonstrate the following deficits muscle weakness and muscle joint tightness, decreased cardiorespiratoy endurance, impaired timing and sequencing, abnormal tone, unbalanced muscle activation, motor apraxia, decreased coordination and decreased motor planning, decreased midline orientation and decreased motor planning, decreased initiation, decreased attention, decreased awareness, decreased problem solving, decreased safety awareness, decreased memory and delayed processing and decreased sitting balance, decreased standing balance, decreased postural control and decreased balance strategies and therefore will continue to benefit from skilled PT intervention to increase functional independence with mobility.  Patient progressing toward long term goals..  Continue plan of care.  PT Short Term Goals Week 2:  PT Short Term Goal 1 (Week 2): Pt will perform supine<>sit with min assist PT Short Term Goal 1 - Progress (Week 2): Met PT Short Term Goal 2  (Week 2): Pt will perform sit<>stand with min assist PT Short Term Goal 2 - Progress (Week 2): Progressing toward goal PT Short Term Goal 3 (Week 2): Pt will perform bed<>chair transfer with min assist PT Short Term Goal 3 - Progress (Week 2): Progressing toward goal PT Short Term Goal 4 (Week 2): Pt will ambulate at least 565fusing LRAD with mod assist of 1 PT Short Term Goal 4 - Progress (Week 2): Met Week 3:  PT Short Term Goal 1 (Week 3): = to LTGs based on ELOS  Skilled Therapeutic Interventions/Progress Updates:  Ambulation/gait training;Community reintegration;DME/adaptive equipment instruction;Neuromuscular re-education;Psychosocial support;Stair training;UE/LE Strength taining/ROM;Wheelchair propulsion/positioning;Balance/vestibular training;Discharge planning;Functional electrical stimulation;Pain management;Skin care/wound management;Therapeutic Activities;UE/LE Coordination activities;Cognitive remediation/compensation;Disease management/prevention;Functional mobility training;Patient/family education;Splinting/orthotics;Therapeutic Exercise;Visual/perceptual remediation/compensation   Session 1: Pt received supine in bed with NT exiting upon therapist's arrival. Pt noted to have been incontinent of bladder in brief and bed linen partly soiled. Supine>sitting R EOB, HOB flat and using bedrails, with min assist for bringing trunk forward due to posterior lean/LOB. Sitting EOB continues to require close supervision with intermittent min assist to prevent posterior trunk LOB. Sit>stand EOB>RW with mod assist for lifting into standing and upon standing pt continues to demonstrate strong R posterior lean despite cuing/facilitation so returned to sitting EOB. Transitioned to R squat pivot to w/c with heavy mod assist for lifting and pivoting hips. Sit<>stands at sink with min/mod assist for lifting/balance. Standing at sink with UE support on sink as needed with min/mod assist for balance due to R  posterior lean - during dual-task or without UE support pt has stronger R posterior lean requiring max cuing to attend to LOB and correct via cues to "bring stomach to sink." Performed peri-care with set-up assist and brief management with total assist. Gait training ~3060fsing RW  with min assist walking straight and mod assist when turning for balance and AD management due to R posterior lean/LOB - education/training on attending to increased R LE step length. Pt left seated in w/c with needs in reach, seat belt alarm on, telesitter in place, and RN present.  Session 2: Pt received sitting in w/c with NT present and pt agreeable to therapy session with encouragement. Sitting in w/c donned shirt and pants max assist for time management. Sit<>stand using RW with min assist required 2nd attempt to come to standing without posterior LOB plopping back into chair. Pulled up pants with min assist for balance due to R posterior lean.  Transported to/from gym in w/c for time management and energy conservation. R stand pivot to EOM using RW with min assist for balance - max cuing for R LE lateral stepping and AD management. Sit<>stands during session with min assist - tactile cuing for anterior weight shift - intermittently has posterior lean causing him to plop back down on mat with poor eccentric control. Standing dual-cognitive task challenge of creating easy comlpexity PEG board patterns - requires CGA progressed to mod assist for balance due to gradual onset of worsening R posterior lean although demonstrates ability to maintain balance longer prior to LOB compared to earlier in rehab stay - max cuing for attending to balance when lean/LOB occurs rather than sustaining attention on creating PEG board pattern. Gait training ~30f using RW with min assist for balance walking straight and mod assist for balance when turning - continues to demonstrate shuffled steps with decreased R LE step length - cuing for sustained  attention on task. Donned 7lb ankle weigh on RLE and repeated ambulation task with same assist and pt demonstrates less improvement today with the ankle weight due to increased distraction in gym therefore removed weight. Gait training using RW through agility ladder used as external target for increased step length but pt demonstrates significant dual task impairments with this task requiring mod assist for balance and increased assist for AD management as well as pt becoming fatigued. Transported back to room and left seated in w/c with needs in reach, seat belt alarm on, and telesitter in place.   Therapy Documentation Precautions:  Precautions Precautions: Fall Precaution Comments: posterior and right lean in standing Restrictions Weight Bearing Restrictions: No  Pain:   Session 1: No reports of pain throughout session.  Session 2: No reports of pain throughout session.  Therapy/Group: Individual Therapy  CTawana Scale PT, DPT 09/01/2019, 7:59 AM

## 2019-09-01 NOTE — Progress Notes (Signed)
[facility] PHYSICAL MEDICINE & REHABILITATION PROGRESS NOTE   Subjective/Complaints:  No issues overnight.  Oriented to person and place ROS: Patient denies CP, SOB, N/V/D     Objective:   No results found. No results for input(s): WBC, HGB, HCT, PLT in the last 72 hours. No results for input(s): NA, K, CL, CO2, GLUCOSE, BUN, CREATININE, CALCIUM in the last 72 hours.  Intake/Output Summary (Last 24 hours) at 09/01/2019 0946 Last data filed at 09/01/2019 0800 Gross per 24 hour  Intake 477 ml  Output 150 ml  Net 327 ml     Physical Exam: Vital Signs Blood pressure 139/90, pulse 73, temperature 97.8 F (36.6 C), resp. rate 18, height 5\' 9"  (1.753 m), weight 87.4 kg, SpO2 98 %.  General: No acute distress Mood and affect are appropriate Heart: Regular rate and rhythm no rubs murmurs or extra sounds Lungs: Clear to auscultation, breathing unlabored, no rales or wheezes Abdomen: Positive bowel sounds, soft nontender to palpation, nondistended Extremities: No clubbing, cyanosis, or edema Skin: No evidence of breakdown, no evidence of rash   Neurologic: slow processing. Follows basic commands. Cranial nerves II through XII intact, motor strength is 5/5 in Bilateral  deltoid, bicep, tricep, grip, 5/5 left and 3/5 Right hip flexor, knee extensors, ankle dorsiflexor and plantar flexor Functional mobility: posterior lean in standing Musculoskeletal: Full range of motion in all 4 extremities. No joint swelling  Assessment/Plan: 1. Functional deficits secondary to left ACA infarct which require 3+ hours per day of interdisciplinary therapy in a comprehensive inpatient rehab setting.  Physiatrist is providing close team supervision and 24 hour management of active medical problems listed below.  Physiatrist and rehab team continue to assess barriers to discharge/monitor patient progress toward functional and medical goals  Care Tool:  Bathing  Bathing activity did not occur:  Safety/medical concerns Body parts bathed by patient: Right arm, Left arm, Chest, Abdomen, Right upper leg, Left upper leg, Right lower leg, Left lower leg, Face   Body parts bathed by helper: Buttocks, Front perineal area     Bathing assist Assist Level: Moderate Assistance - Patient 50 - 74%     Upper Body Dressing/Undressing Upper body dressing   What is the patient wearing?: Pull over shirt    Upper body assist Assist Level: Supervision/Verbal cueing    Lower Body Dressing/Undressing Lower body dressing      What is the patient wearing?: Pants, Incontinence brief     Lower body assist Assist for lower body dressing: Moderate Assistance - Patient 50 - 74%     Toileting Toileting    Toileting assist Assist for toileting: Moderate Assistance - Patient 50 - 74%     Transfers Chair/bed transfer  Transfers assist  Chair/bed transfer activity did not occur: Safety/medical concerns  Chair/bed transfer assist level: Moderate Assistance - Patient 50 - 74%     Locomotion Ambulation   Ambulation assist   Ambulation activity did not occur: Safety/medical concerns  Assist level: Moderate Assistance - Patient 50 - 74% Assistive device: Walker-rolling Max distance: 64ft   Walk 10 feet activity   Assist  Walk 10 feet activity did not occur: Safety/medical concerns  Assist level: Moderate Assistance - Patient - 50 - 74% Assistive device: Walker-rolling   Walk 50 feet activity   Assist Walk 50 feet with 2 turns activity did not occur: Safety/medical concerns  Assist level: Moderate Assistance - Patient - 50 - 74% Assistive device: Walker-rolling    Walk 150 feet activity  Assist Walk 150 feet activity did not occur: Safety/medical concerns         Walk 10 feet on uneven surface  activity   Assist Walk 10 feet on uneven surfaces activity did not occur: Safety/medical concerns         Wheelchair     Assist Will patient use wheelchair at  discharge?: (TBD)             Wheelchair 50 feet with 2 turns activity    Assist            Wheelchair 150 feet activity     Assist          Blood pressure 139/90, pulse 73, temperature 97.8 F (36.6 C), resp. rate 18, height 5\' 9"  (1.753 m), weight 87.4 kg, SpO2 98 %.  Medical Problem List and Plan: 1.  Right side weakness with dysarthria secondary to left ACA territory acute infarction as well as history of CVA 2020 with cognitive deficits Cognition and RLE weakness are main issues              -patient may shower             -plan is now SNF - is medically stable   Continue CIR PT, OT, SLP   2.  Antithrombotics: -DVT/anticoagulation: Eliquis             -antiplatelet therapy: N/A 3. Pain Management: Tylenol as needed. Well controlled 4. Mood: Melatonin 3 mg nightly             -antipsychotic agents: N/A 5. Neuropsych: This patient is capable of making decisions on his own behalf. Poor concentration attention, initiation , as discussed with pt , methylphenidate trial not helpful, have discontinued   6. Skin/Wound Care: Routine skin checks 7. Fluids/Electrolytes/Nutrition: Routine in and outs. CMP ordered. 8.  History of saddle pulmonary emboli.  Continue Eliquis 9.  Diabetes mellitus.  Hemoglobin A1c 6.1.  SSI.  Patient on Glucophage 1000 mg twice daily prior to admission.  Resume as needed CBG (last 3)  Recent Labs    08/31/19 1559 08/31/19 2054 09/01/19 0611  GLUCAP 133* 124* 97       Controlled, 5/21 10.  Hypertension.  Avapro 75 mg daily.           Vitals:   08/31/19 1953 09/01/19 0424  BP: 113/70 139/90  Pulse: 65 73  Resp: 18 18  Temp: 98.4 F (36.9 C) 97.8 F (36.6 C)  SpO2: 94% 98%   Good control 5/21 11.  Hypothyroidism. Continue Synthroid 12.  Hyperlipidemia. Lipitor 13.  History of tobacco abuse.  Counseling    LOS: 16 days A FACE TO FACE EVALUATION WAS PERFORMED  6/21 09/01/2019, 9:46 AM

## 2019-09-02 ENCOUNTER — Inpatient Hospital Stay (HOSPITAL_COMMUNITY): Payer: Medicare Other | Admitting: Physical Therapy

## 2019-09-02 LAB — GLUCOSE, CAPILLARY
Glucose-Capillary: 105 mg/dL — ABNORMAL HIGH (ref 70–99)
Glucose-Capillary: 164 mg/dL — ABNORMAL HIGH (ref 70–99)
Glucose-Capillary: 180 mg/dL — ABNORMAL HIGH (ref 70–99)
Glucose-Capillary: 141 mg/dL — ABNORMAL HIGH (ref 70–99)

## 2019-09-02 LAB — TROPONIN I (HIGH SENSITIVITY)
Troponin I (High Sensitivity): 4335 ng/L (ref <18)
Troponin I (High Sensitivity): >27000 ng/L (ref <18)

## 2019-09-02 MED ORDER — ASPIRIN 81 MG PO CHEW
81.0000 mg | CHEWABLE_TABLET | Freq: Once | ORAL | Status: AC
  Administered 2019-09-02: 81 mg via ORAL
  Filled 2019-09-02: qty 1

## 2019-09-02 MED ORDER — SODIUM CHLORIDE 0.9 % IV SOLN
INTRAVENOUS | Status: DC | PRN
  Administered 2019-09-02: 250 mL via INTRAVENOUS

## 2019-09-02 MED ORDER — NITROGLYCERIN 0.4 MG SL SUBL
0.4000 mg | SUBLINGUAL_TABLET | SUBLINGUAL | Status: DC | PRN
  Administered 2019-09-02: 0.4 mg via SUBLINGUAL
  Filled 2019-09-02: qty 1

## 2019-09-02 MED ORDER — HEPARIN (PORCINE) 25000 UT/250ML-% IV SOLN
950.0000 [IU]/h | INTRAVENOUS | Status: DC
  Administered 2019-09-02: 1050 [IU]/h via INTRAVENOUS
  Administered 2019-09-03 – 2019-09-06 (×6): 1150 [IU]/h via INTRAVENOUS
  Administered 2019-09-07 – 2019-09-08 (×2): 1000 [IU]/h via INTRAVENOUS
  Administered 2019-09-09: 950 [IU]/h via INTRAVENOUS
  Filled 2019-09-02 (×9): qty 250

## 2019-09-02 MED ORDER — HEPARIN (PORCINE) 25000 UT/250ML-% IV SOLN
1050.0000 [IU]/h | INTRAVENOUS | Status: DC
  Filled 2019-09-02: qty 250

## 2019-09-02 NOTE — Progress Notes (Signed)
EKG CRITICAL VALUE     12 lead EKG performed.  Critical value noted. Regino Schultze, RN notified.   Alto [place], CCT 09/02/2019 12:14 PM

## 2019-09-02 NOTE — Significant Event (Signed)
Rapid Response Event Note  I received a call from nursing staff with questions about the "Code STEMI" policy. Per nursing, the patient had an acute onset of substernal chest pressure this morning around 1130 coupled with nausea. Per staff, MD came the bedside and an EKG was done - it revealed an inferior infarct. The patient was given NTG SL 0.4 mg x 1 and ASA 81 mg -- after which the chest pain was relieved. CARDS MD was called by Rehab MD and patient's family was present. Decision was made by patient's family to not go to the Cardiac Cath Lab at this and per Rehab staff, CARDS MD agreed with plan of care as well.   When I was initially called, I was on my way to see another critical patient and since the patient was not having chest pain now and no immediate acute intervention was being taken, I proceeded to my critical patient. Patient was already on Eliquis.   I came to the see at 1310, spoke with the nursing staff and Rehab MD. Patient was awake and talking, able to answer some questions, denied CP/SOB, was not in any distress. Skin warm and dry, VS - BP 131/64, HR 60-70s, 98% on RA, RR normal - 18. Lung sound clear throughout - again denies pain with inspiration. Patient was able to follow commands. Blood sugar per staff was normal.   Plan: -- If chest pain comes back - call Code STEMI -- Treat chest pain as needed  -- Bedrest for now -- Monitor VS  -- Call RRRN as needed   Call Time 1250 Arrival Time 1310 End Time 1350   Kensie Susman R

## 2019-09-02 NOTE — Progress Notes (Addendum)
ANTICOAGULATION CONSULT NOTE - Initial Consult  Pharmacy Consult for heparin Indication: chest pain/ACS  No Known Allergies  Patient Measurements: Height: 5\' 9"  (175.3 cm) Weight: 87.4 kg (192 lb 10.9 oz) IBW/kg (Calculated) : 70.7 Heparin Dosing Weight: 89.9 kg  Vital Signs: Temp: 97.4 F (36.3 C) (05/22 1124) Temp Source: Oral (05/22 0540) BP: 129/80 (05/22 1400) Pulse Rate: 63 (05/22 1400)  Labs: Recent Labs    09/02/19 1224  TROPONINIHS 4,335*    Estimated Creatinine Clearance: 77.3 mL/min (by C-G formula based on SCr of 0.89 mg/dL).   Medical History: Past Medical History:  Diagnosis Date  . Antiphospholipid syndrome (HCC)   . CVA (cerebral vascular accident) (HCC) 06/07/2019  . Diabetes mellitus without complication (HCC)   . Hypertension   . Hypothyroidism   . Pulmonary embolism [facility])     Assessment: 74 year old male presented with CVA 4/29 and transferred to CIR 5/5 now experiencing chest pain and troponins >4000. Pharmacy consulted to dose heparin. Apixaban for history of PE now on hold, last dose 5/22 0800.   Goal of Therapy:  Heparin level 0.3-0.7 units/ml Monitor platelets by anticoagulation protocol: Yes   Plan:  - No heparin bolus - Heparin infusion at 1050 units/hour to start at 09/02/19 2000 - 6-hour heparin and aPTT level at 09/03/19 0200 - Daily heparin level, CBC and aPTT  - Adjust heparin levels based on aPTT until levels coordinate - Monitor for signs and symptoms of bleed     Raymund Manrique L. 09/05/19, PharmD [facility] PGY1 Pharmacy Resident [phone] 09/02/19     2:15 PM  Please check AMION for all [facility] Pharmacy phone numbers After 10:00 PM, call the Main Pharmacy [phone]

## 2019-09-02 NOTE — Progress Notes (Addendum)
Physical Therapy Session Note  Patient Details  Name: Roger Ramirez MRN: [id number] Date of Birth: 1946/01/29  Today's Date: 09/02/2019 PT Individual Time: 0807-0900 PT Individual Time Calculation (min): 53 min   Short Term Goals: Week 3:  PT Short Term Goal 1 (Week 3): = to LTGs based on ELOS  Skilled Therapeutic Interventions/Progress Updates: Pt presents sitting up in bed finishing breakfast., but then agreeable to treatment.  Pt performed sup to sit transfer w/ mod A after bringing LEs off bed.  Pt sat EOB w/ occasional retropulsion but CGA to min to regain.  Pt lifted LEs to don pants and then pulls to knees.  Pt removed gown and placed scrub over head w/ slight retropulsion.  Pt required mod A for sit to stand w/ RW and mod to max A as pulls pants over hips w/ strong retropulsion and flexed posture.  Pt then states having BM.  Pt amb to BR w/ mod A, verbal cues for posture and use of visual aide of T-band to increase step length, esp. Right.  Pt required manual cues to negotiate ramped surface into BR.  Pt required max A for doffing pants and brief, w/ incontinent of BM and then continent into toilet.  Pt stood and required max A for competed peri-care, although does perform.  Pt required max A to manage clothing.  Pt amb out of BR to w/c w/ cueing for hand placement w/ safe stand to sit.  Pt stood at sink to wash hands w/ min to mod A and verbal cues for flexed posture/  Pt wheeled to gym.  Pt amb approx. 20' to Nu-step and performed at Level 2 for 3' w/o c/o.  Pt amb 20' back to w/c w/ mod A and cueing for posture and use of T-band on RW to maintain positioning and step length right LE, as well as foot clearance..  Pt returned to room and chair alarm on and all needs in place.  NT told of BM status for charting.     Therapy Documentation Precautions:  Precautions Precautions: Fall Precaution Comments: posterior and right lean in standing Restrictions Weight Bearing Restrictions:  No General:   Vital Signs: Therapy Vitals Temp: 98.2 F (36.8 C) Temp Source: Oral Pulse Rate: 75 Resp: 18 BP: 114/66 Patient Position (if appropriate): Lying Oxygen Therapy SpO2: 98 % O2 Device: Room Air Pain:no c/o pain. Pain Assessment Pain Scale: 0-10 Pain Score: 0-No pain Mobility:   :      Therapy/Group: Individual Therapy  Lucio Edward 09/02/2019, 9:03 AM

## 2019-09-02 NOTE — Progress Notes (Signed)
Patient complained of chest pain, mild nausea. MD notified and came to assess. Orders given for EKG. Patient not in acute distress, will continue to monitor. Consulting civil engineer notified.

## 2019-09-02 NOTE — Progress Notes (Deleted)
ANTICOAGULATION CONSULT NOTE - Initial Consult  Pharmacy Consult for heparin Indication: chest pain/ACS  No Known Allergies  Patient Measurements: Height: 5\' 9"  (175.3 cm) Weight: 87.4 kg (192 lb 10.9 oz) IBW/kg (Calculated) : 70.7 Heparin Dosing Weight: 89.9 kg  Vital Signs: Temp: 97.4 F (36.3 C) (05/22 1124) Temp Source: Oral (05/22 0540) BP: 131/64 (05/22 1330) Pulse Rate: 66 (05/22 1330)  Labs: Recent Labs    09/02/19 1224  TROPONINIHS 4,335*    Estimated Creatinine Clearance: 77.3 mL/min (by C-G formula based on SCr of 0.89 mg/dL).   Medical History: Past Medical History:  Diagnosis Date  . Antiphospholipid syndrome (HCC)   . CVA (cerebral vascular accident) (HCC) 06/07/2019  . Diabetes mellitus without complication (HCC)   . Hypertension   . Hypothyroidism   . Pulmonary embolism [facility])     Assessment: 74 year old male presented with CVA 4/29 and transferred to CIR 5/5 now experiencing chest pain and troponins >4000. Pharmacy consulted to dose heparin. Apixaban for history of PE now on hold, last dose 5/22 0800.   Goal of Therapy:  Heparin level 0.3-0.7 units/ml Monitor platelets by anticoagulation protocol: Yes   Plan:  - No heparin bolus - Heparin infusion at 1050 units/hour - 6-hour heparin and aPTT level at 2100 - Daily heparin level, CBC and aPTT  - Adjust heparin levels based on aPTT until levels coordinate - Monitor for signs and symptoms of bleed     Cybil Senegal L. 6/22, PharmD [facility] PGY1 Pharmacy Resident [phone] 09/02/19     2:02 PM  Please check AMION for all [facility] Pharmacy phone numbers After 10:00 PM, call the Main Pharmacy [phone]

## 2019-09-02 NOTE — Progress Notes (Signed)
Critical result reported to this RN. MD notified. Orders given.

## 2019-09-02 NOTE — Progress Notes (Signed)
[facility] PHYSICAL MEDICINE & REHABILITATION PROGRESS NOTE   Subjective/Complaints:  Initially seen this AM- pt was comfortable Called back to bedside ~ 11am and told pt c/o CP- pt said was L sided- slightly reproducible, but not all of it.   Got EKG- showed acute inferior MI-initial troponin showed elevation of 4.335 Gave NTG and ASA 81 mg- and pt reported chest pain "98%" better.  Called Cardiology- said COULD take to cath lab, however due to DNR status and recent CVA< at higher risk of stroke on table- asked me to speak with pt's family Spoke to daughter [name] initially- explained risks/benefits- she wanted to speak with brother and sister in law- Legrand Como and [employer] again to both of them with pt in the room.   At that point, pt's vitals were very normal- HR 61-65- BP 125/60s, RR- 20-22, and sats 96-98% on RA. Pt had good color- and although somewhat lethargic, falling asleep when he couldn't hear conversation [facility]), would wake easily.  Was clear chest pain had passed and felt good. Family was with me, at bedside during this time.   Family decided to not put pt through increased risk of stroke/hemorrhage and go to cath lab- however if pt got worse, would rethink possibility- spoke again to Dr Gwenlyn Found, Cards- decided to put pt on Heparin gtt and stop Eliquis for now, just in case might need cath lab in next 1-2 days.      ROS: limited due to cognition, Acute MI pt had this AM     Objective:   No results found. No results for input(s): WBC, HGB, HCT, PLT in the last 72 hours. No results for input(s): NA, K, CL, CO2, GLUCOSE, BUN, CREATININE, CALCIUM in the last 72 hours.  Intake/Output Summary (Last 24 hours) at 09/02/2019 1842 Last data filed at 09/02/2019 1834 Gross per 24 hour  Intake 1268.75 ml  Output 850 ml  Net 418.75 ml     Physical Exam: Vital Signs Blood pressure (!) 146/75, pulse 82, temperature 98.3 F (36.8 C), temperature source Oral, resp. rate 20,  height 5\' 9"  (1.753 m), weight 87.4 kg, SpO2 98 %.  General: awake, lethargic but woke to verbal stimuli; appropriate Ox2, NAD Mood and affect are appropriate/lethargic Heart: initially 2/6 murmur with RRR and occ extra heart sound- like PVC?; 2nd time listened- was RRR- rate low to mid 60s Lungs:CTA B/L- no W/R/R- good air movement Abdomen: Soft, NT, ND, (+)BS  Extremities: No clubbing, cyanosis, or edema Skin: No evidence of breakdown, no evidence of rash- good color in face- per daughter hand was cool to touch- I didn't find it so, but not hot   Neurologic: slowed processing- stable per staff Follows basic commands- still Also appears HOH . Cranial nerves II through XII intact, motor strength is 5/5 in Bilateral  deltoid, bicep, tricep, grip, 5/5 left and 3/5 Right hip flexor, knee extensors, ankle dorsiflexor and plantar flexor Functional mobility: posterior lean in standing Musculoskeletal: Full range of motion in all 4 extremities. No joint swelling  Assessment/Plan: 1. Functional deficits secondary to left ACA infarct which require 3+ hours per day of interdisciplinary therapy in a comprehensive inpatient rehab setting.  Physiatrist is providing close team supervision and 24 hour management of active medical problems listed below.  Physiatrist and rehab team continue to assess barriers to discharge/monitor patient progress toward functional and medical goals  Care Tool:  Bathing  Bathing activity did not occur: Safety/medical concerns Body parts bathed by patient: Right arm,  Left arm, Chest, Abdomen, Right upper leg, Left upper leg, Right lower leg, Left lower leg, Face   Body parts bathed by helper: Front perineal area, Buttocks     Bathing assist Assist Level: Moderate Assistance - Patient 50 - 74%     Upper Body Dressing/Undressing Upper body dressing   What is the patient wearing?: Pull over shirt    Upper body assist Assist Level: Supervision/Verbal cueing     Lower Body Dressing/Undressing Lower body dressing      What is the patient wearing?: Pants, Incontinence brief     Lower body assist Assist for lower body dressing: Moderate Assistance - Patient 50 - 74%     Toileting Toileting    Toileting assist Assist for toileting: Moderate Assistance - Patient 50 - 74%     Transfers Chair/bed transfer  Transfers assist  Chair/bed transfer activity did not occur: Safety/medical concerns  Chair/bed transfer assist level: Moderate Assistance - Patient 50 - 74%     Locomotion Ambulation   Ambulation assist   Ambulation activity did not occur: Safety/medical concerns  Assist level: Moderate Assistance - Patient 50 - 74% Assistive device: Walker-rolling Max distance: 20'   Walk 10 feet activity   Assist  Walk 10 feet activity did not occur: Safety/medical concerns  Assist level: Moderate Assistance - Patient - 50 - 74% Assistive device: Walker-rolling   Walk 50 feet activity   Assist Walk 50 feet with 2 turns activity did not occur: Safety/medical concerns  Assist level: Moderate Assistance - Patient - 50 - 74% Assistive device: Walker-rolling    Walk 150 feet activity   Assist Walk 150 feet activity did not occur: Safety/medical concerns         Walk 10 feet on uneven surface  activity   Assist Walk 10 feet on uneven surfaces activity did not occur: Safety/medical concerns         Wheelchair     Assist Will patient use wheelchair at discharge?: (TBD)             Wheelchair 50 feet with 2 turns activity    Assist            Wheelchair 150 feet activity     Assist          Blood pressure (!) 146/75, pulse 82, temperature 98.3 F (36.8 C), temperature source Oral, resp. rate 20, height 5\' 9"  (1.753 m), weight 87.4 kg, SpO2 98 %.  Medical Problem List and Plan: 1.  Right side weakness with dysarthria secondary to left ACA territory acute infarction as well as history of  CVA 2020 with cognitive deficits Cognition and RLE weakness are main issues              -patient may shower             -plan is now SNF - is medically stable   Continue CIR PT, OT, SLP  5/22- Acute inferior MI based on EKG- Troponin 4.335; f/u troponin >27k- no additional call from staff regarding any chest pain, any other cardiac Sx's - will monitor closely  2.  Antithrombotics: -DVT/anticoagulation: Eliquis 5/22- eliquis stopped for now; put on Heparin gtt just in case has to go to cath lab.             -antiplatelet therapy: N/A 3. Pain Management: Tylenol as needed. Well controlled 4. Mood: Melatonin 3 mg nightly             -antipsychotic agents: N/A  5. Neuropsych: This patient is capable of making decisions on his own behalf. Poor concentration attention, initiation , as discussed with pt , methylphenidate trial not helpful, have discontinued   6. Skin/Wound Care: Routine skin checks 7. Fluids/Electrolytes/Nutrition: Routine in and outs. CMP ordered. 8.  History of saddle pulmonary emboli.  Continue Eliquis 9.  Diabetes mellitus.  Hemoglobin A1c 6.1.  SSI.  Patient on Glucophage 1000 mg twice daily prior to admission.  Resume as needed CBG (last 3)  Recent Labs    09/02/19 0601 09/02/19 1141 09/02/19 1640  GLUCAP 105* 164* 180*       5/22- BGs controlled- con't meds 10.  Hypertension.  Avapro 75 mg daily.           Vitals:   09/02/19 1500 09/02/19 1712  BP: 116/62 (!) 146/75  Pulse:  82  Resp:  20  Temp:  98.3 F (36.8 C)  SpO2:  98%   5/22- BP controlled and in good levels today 11.  Hypothyroidism. Continue Synthroid 12.  Hyperlipidemia. Lipitor 13.  History of tobacco abuse.  Counseling  14. Acute inferior MI  5/22- detailed issues as above- given additional ASA 81 mg and 1 NTG- pain resolved after NTG- no more Sx's- vitals stable- asked staff to call Code STEMI if any additional chest pain- Dr Allyson Sabal, Cards, aware of pt and issues. Of note, pt is DNR- family  discussed POSSIBLY rescinding DNR for cath lab- however was NOT clear on plan. At this time have decided AGAINST cath lab and want to treat medically- no additional BP meds because BP is in good control.    I spent a total of 1.5 hours taking care of pt today in direct pt care, calling Cards consult and talking to Cards x3- speaking with daughter THEN also speaking with entire family in front of pt-      LOS: 17 days A FACE TO FACE EVALUATION WAS PERFORMED  Megan Lovorn 09/02/2019, 6:42 PM

## 2019-09-03 ENCOUNTER — Inpatient Hospital Stay (HOSPITAL_COMMUNITY): Payer: Medicare Other

## 2019-09-03 DIAGNOSIS — I10 Essential (primary) hypertension: Secondary | ICD-10-CM

## 2019-09-03 DIAGNOSIS — E7800 Pure hypercholesterolemia, unspecified: Secondary | ICD-10-CM

## 2019-09-03 DIAGNOSIS — D6861 Antiphospholipid syndrome: Secondary | ICD-10-CM

## 2019-09-03 DIAGNOSIS — F0150 Vascular dementia without behavioral disturbance: Secondary | ICD-10-CM

## 2019-09-03 DIAGNOSIS — Z8673 Personal history of transient ischemic attack (TIA), and cerebral infarction without residual deficits: Secondary | ICD-10-CM

## 2019-09-03 LAB — CBC
HCT: 40.4 % (ref 39.0–52.0)
Hemoglobin: 13.3 g/dL (ref 13.0–17.0)
MCH: 29.8 pg (ref 26.0–34.0)
MCHC: 32.9 g/dL (ref 30.0–36.0)
MCV: 90.6 fL (ref 80.0–100.0)
Platelets: 234 10*3/uL (ref 150–400)
RBC: 4.46 MIL/uL (ref 4.22–5.81)
RDW: 13.3 % (ref 11.5–15.5)
WBC: 10.4 10*3/uL (ref 4.0–10.5)
nRBC: 0 % (ref 0.0–0.2)

## 2019-09-03 LAB — HEPARIN LEVEL (UNFRACTIONATED): Heparin Unfractionated: 1 IU/mL — ABNORMAL HIGH (ref 0.30–0.70)

## 2019-09-03 LAB — GLUCOSE, CAPILLARY
Glucose-Capillary: 128 mg/dL — ABNORMAL HIGH (ref 70–99)
Glucose-Capillary: 155 mg/dL — ABNORMAL HIGH (ref 70–99)
Glucose-Capillary: 152 mg/dL — ABNORMAL HIGH (ref 70–99)
Glucose-Capillary: 117 mg/dL — ABNORMAL HIGH (ref 70–99)

## 2019-09-03 LAB — APTT
aPTT: 65 seconds — ABNORMAL HIGH (ref 24–36)
aPTT: 93 seconds — ABNORMAL HIGH (ref 24–36)

## 2019-09-03 NOTE — Progress Notes (Signed)
Patient has been alert and communicative throughout shift. Toileted with urinal but patient was unable to void. Shortly after, checked on patient and he had independently used urinal including managing pants and brief and hung full urinal on bedside with no spills. At least two incidences of independent use of urinal so far this shift.

## 2019-09-03 NOTE — Progress Notes (Signed)
ANTICOAGULATION CONSULT NOTE  Pharmacy Consult for heparin Indication: chest pain/ACS  No Known Allergies  Patient Measurements: Height: 5\' 9"  (175.3 cm) Weight: 87.4 kg (192 lb 10.9 oz) IBW/kg (Calculated) : 70.7 Heparin Dosing Weight: 89.9 kg  Vital Signs: Temp: 98.1 F (36.7 C) (05/23 0415) BP: 130/61 (05/23 0415) Pulse Rate: 57 (05/23 0415)  Labs: Recent Labs    09/02/19 1224 09/02/19 1444 09/03/19 0218 09/03/19 1140  HGB  --   --  13.3  --   HCT  --   --  40.4  --   PLT  --   --  234  --   APTT  --   --  65* 93*  HEPARINUNFRC  --   --  1.00*  --   TROPONINIHS 4,335* >27,000*  --   --     Estimated Creatinine Clearance: 77.3 mL/min (by C-G formula based on SCr of 0.89 mg/dL).   Medical History: Past Medical History:  Diagnosis Date  . Antiphospholipid syndrome (HCC)   . CVA (cerebral vascular accident) (HCC) 06/07/2019  . Diabetes mellitus without complication (HCC)   . Hypertension   . Hypothyroidism   . Pulmonary embolism [facility])     Assessment: 74 year old male presented with CVA 4/29 and transferred to CIR 5/5 now experiencing chest pain and troponins >4000. Pharmacy consulted to dose heparin. Apixaban for history of PE now on hold, last dose 5/22 0800.  aPTT now therapeutic at 0.93. Hgb 13.3, plt 234. No issues with infusion or bleeding reported per RN.   Goal of Therapy:  Heparin level 0.3-0.7 units/ml aPTT 66-102 seconds Monitor platelets by anticoagulation protocol: Yes   Plan:  - Continue heparin infusion at 1150 units/hr  - Daily aPTT, heparin level, and CBC - Adjust heparin levels based on aPTT until levels correlate  - Monitor for signs and symptoms of bleed    Roger Boody L. 6/22, PharmD [facility] PGY1 Pharmacy Resident [phone] 09/03/19    12:25 PM  Please check AMION for all [facility] Pharmacy phone numbers After 10:00 PM, call the Main Pharmacy [phone]

## 2019-09-03 NOTE — Progress Notes (Signed)
Called to patient room by tech who observed the IV tubing unhooked from the patient IV site where IV heparin was infusing. Ecchymosis noted to site with no warmth and patient reports slight pain to area but unable to rate due to cognitive impairment. IV removed and pressured held at site to control bleeding. No grimacing observed in the patient or other s/s of pain. Compression wrap and ice applied to area. IV consult placed to start new IV. Heparin stopped at this time pending new IV access.

## 2019-09-03 NOTE — Progress Notes (Signed)
ANTICOAGULATION CONSULT NOTE  Pharmacy Consult for heparin Indication: chest pain/ACS  No Known Allergies  Patient Measurements: Height: 5\' 9"  (175.3 cm) Weight: 87.4 kg (192 lb 10.9 oz) IBW/kg (Calculated) : 70.7 Heparin Dosing Weight: 89.9 kg  Vital Signs: Temp: 98.3 F (36.8 C) (05/22 2032) Temp Source: Oral (05/22 1712) BP: 125/85 (05/22 2032) Pulse Rate: 69 (05/22 2032)  Labs: Recent Labs    09/02/19 1224 09/02/19 1444 09/03/19 0218  HGB  --   --  13.3  HCT  --   --  40.4  PLT  --   --  234  APTT  --   --  65*  HEPARINUNFRC  --   --  1.00*  TROPONINIHS 4,335* >27,000*  --     Estimated Creatinine Clearance: 77.3 mL/min (by C-G formula based on SCr of 0.89 mg/dL).   Medical History: Past Medical History:  Diagnosis Date  . Antiphospholipid syndrome (HCC)   . CVA (cerebral vascular accident) (HCC) 06/07/2019  . Diabetes mellitus without complication (HCC)   . Hypertension   . Hypothyroidism   . Pulmonary embolism [facility])     Assessment: 74 year old male presented with CVA 4/29 and transferred to CIR 5/5 now experiencing chest pain and troponins >4000. Pharmacy consulted to dose heparin. Apixaban for history of PE now on hold, last dose 5/22 0800.  Heparin level falsely elevated as expected with recent apixaban dose, aPTT slightly subtherapeutic at 65, on 1050 units/hr. Hgb 13.3, plt 234. No s/sx of bleeding or infusion issues.    Goal of Therapy:  Heparin level 0.3-0.7 units/ml aPTT 66-102 seconds Monitor platelets by anticoagulation protocol: Yes   Plan:  - Increase heparin infusion to 1150 units/hr  - Confirm aPTT  in 8 hours  - Daily heparin level, CBC and aPTT  - Adjust heparin levels based on aPTT until levels coordinate - Monitor for signs and symptoms of bleed  6/22, PharmD, BCCCP Clinical Pharmacist  09/03/2019 3:52 AM  Please check AMION for all [facility] Pharmacy phone numbers After 10:00 PM, call Main Pharmacy [phone]

## 2019-09-03 NOTE — Progress Notes (Signed)
[facility] PHYSICAL MEDICINE & REHABILITATION PROGRESS NOTE   Subjective/Complaints:   Pt seen again this AM- reports feeling "Randie Heinz' and knows he had MI yesterday but says "doesn't feel like it".   No more CP and feels fine.   Spoke with daughter Efraim Kaufmann- called her back- she's concerned about antiphospholipid antibody syndrome and Eliquis- I spoke with Neuro- they say Eliquis is used off label now, and they have used it, however since had an MI within 4 weeks of CVA, on Eiliquis, thinks he likely will need Coumadin and keep INR 2.5-3.5- however need to check with Cards to see if they have any other meds like Brillenta, etc, they want to start.      ROS: Pt denies SOB, abd pain, CP, N/V/C/D, and vision changes      Objective:   No results found. Recent Labs    09/03/19 0218  WBC 10.4  HGB 13.3  HCT 40.4  PLT 234   No results for input(s): NA, K, CL, CO2, GLUCOSE, BUN, CREATININE, CALCIUM in the last 72 hours.  Intake/Output Summary (Last 24 hours) at 09/03/2019 1507 Last data filed at 09/03/2019 1310 Gross per 24 hour  Intake 1368.75 ml  Output 650 ml  Net 718.75 ml     Physical Exam: Vital Signs Blood pressure 129/72, pulse 72, temperature 98.3 F (36.8 C), resp. rate 18, height 5\' 9"  (1.753 m), weight 87.4 kg, SpO2 98 %.  General: more awake, sitting up in bed; watching TV, NAD Mood and affect are more appropriate, answering more questions, HOH Heart: RRR- no M/R/G Lungs:CTA B/L- no W/R/R- good air movement Abdomen: Soft, NT, ND, (+)BS   Extremities: No clubbing, cyanosis, or edema Skin: No evidence of breakdown, no evidence of rash-very good color esp in face   Neurologic:less slowed processing Follows basic commands- still Also appears HOH . Cranial nerves II through XII intact, motor strength is 5/5 in Bilateral  deltoid, bicep, tricep, grip, 5/5 left and 3/5 Right hip flexor, knee extensors, ankle dorsiflexor and plantar flexor Functional mobility:  posterior lean in standing Musculoskeletal: Full range of motion in all 4 extremities. No joint swelling  Assessment/Plan: 1. Functional deficits secondary to left ACA infarct which require 3+ hours per day of interdisciplinary therapy in a comprehensive inpatient rehab setting.  Physiatrist is providing close team supervision and 24 hour management of active medical problems listed below.  Physiatrist and rehab team continue to assess barriers to discharge/monitor patient progress toward functional and medical goals  Care Tool:  Bathing  Bathing activity did not occur: Safety/medical concerns Body parts bathed by patient: Right arm, Left arm, Chest, Abdomen, Right upper leg, Left upper leg, Right lower leg, Left lower leg, Face   Body parts bathed by helper: Front perineal area, Buttocks     Bathing assist Assist Level: Moderate Assistance - Patient 50 - 74%     Upper Body Dressing/Undressing Upper body dressing   What is the patient wearing?: Pull over shirt    Upper body assist Assist Level: Supervision/Verbal cueing    Lower Body Dressing/Undressing Lower body dressing      What is the patient wearing?: Pants, Incontinence brief     Lower body assist Assist for lower body dressing: Moderate Assistance - Patient 50 - 74%     Toileting Toileting    Toileting assist Assist for toileting: Moderate Assistance - Patient 50 - 74%     Transfers Chair/bed transfer  Transfers assist  Chair/bed transfer activity did not occur:  Safety/medical concerns  Chair/bed transfer assist level: Moderate Assistance - Patient 50 - 74%     Locomotion Ambulation   Ambulation assist   Ambulation activity did not occur: Safety/medical concerns  Assist level: Moderate Assistance - Patient 50 - 74% Assistive device: Walker-rolling Max distance: 20'   Walk 10 feet activity   Assist  Walk 10 feet activity did not occur: Safety/medical concerns  Assist level: Moderate  Assistance - Patient - 50 - 74% Assistive device: Walker-rolling   Walk 50 feet activity   Assist Walk 50 feet with 2 turns activity did not occur: Safety/medical concerns  Assist level: Moderate Assistance - Patient - 50 - 74% Assistive device: Walker-rolling    Walk 150 feet activity   Assist Walk 150 feet activity did not occur: Safety/medical concerns         Walk 10 feet on uneven surface  activity   Assist Walk 10 feet on uneven surfaces activity did not occur: Safety/medical concerns         Wheelchair     Assist Will patient use wheelchair at discharge?: (TBD)             Wheelchair 50 feet with 2 turns activity    Assist            Wheelchair 150 feet activity     Assist          Blood pressure 129/72, pulse 72, temperature 98.3 F (36.8 C), resp. rate 18, height 5\' 9"  (1.753 m), weight 87.4 kg, SpO2 98 %.  Medical Problem List and Plan: 1.  Right side weakness with dysarthria secondary to left ACA territory acute infarction as well as history of CVA 2020 with cognitive deficits Cognition and RLE weakness are main issues              -patient may shower             -plan is now SNF - is medically stable   Continue CIR PT, OT, SLP  5/22- Acute inferior MI based on EKG- Troponin 4.335; f/u troponin >27k- no additional call from staff regarding any chest pain, any other cardiac Sx's - will monitor closely   2.  Antithrombotics: -DVT/anticoagulation: Eliquis 5/22- eliquis stopped for now; put on Heparin gtt just in case has to go to cath lab.  5/23- on Heparin gtt in case needs to go to cath lab- once done with Heparin gtt- Dr 6/23 felt pt would best be treated with coumadin 2.5-3.5 INR- however needed to find out from Cards if any additional plans for ASA, Plavix, Brillenta, etc.             -antiplatelet therapy: N/A 3. Pain Management: Tylenol as needed. Well controlled 4. Mood: Melatonin 3 mg nightly              -antipsychotic agents: N/A 5. Neuropsych: This patient is capable of making decisions on his own behalf. Poor concentration attention, initiation , as discussed with pt , methylphenidate trial not helpful, have discontinued   6. Skin/Wound Care: Routine skin checks 7. Fluids/Electrolytes/Nutrition: Routine in and outs. CMP ordered. 8.  History of saddle pulmonary emboli.  Continue Eliquis 9.  Diabetes mellitus.  Hemoglobin A1c 6.1.  SSI.  Patient on Glucophage 1000 mg twice daily prior to admission.  Resume as needed CBG (last 3)  Recent Labs    09/02/19 2134 09/03/19 0613 09/03/19 1145  GLUCAP 141* 128* 155*       5/23- BGs controlled-  con't meds 10.  Hypertension.  Avapro 75 mg daily.           Vitals:   09/03/19 0415 09/03/19 1437  BP: 130/61 129/72  Pulse: (!) 57 72  Resp: 16 18  Temp: 98.1 F (36.7 C) 98.3 F (36.8 C)  SpO2: 97% 98%   5/23- BP and pulse controlled and doing well last 24 hours- con't regimen 11.  Hypothyroidism. Continue Synthroid 12.  Hyperlipidemia. Lipitor 13.  History of tobacco abuse.  Counseling  14. Acute inferior MI  5/22- detailed issues as above- given additional ASA 81 mg and 1 NTG- pain resolved after NTG- no more Sx's- vitals stable- asked staff to call Code STEMI if any additional chest pain- Dr Gwenlyn Found, Cards, aware of pt and issues. Of note, pt is DNR- family discussed POSSIBLY rescinding DNR for cath lab- however was NOT clear on plan. At this time have decided AGAINST cath lab and want to treat medically- no additional BP meds because BP is in good control.    I spent a total of 45 minutes on total care today- spoke with pt, his daughter, and 2 neurologists- was unable to reach Cards.    LOS: 18 days A FACE TO FACE EVALUATION WAS PERFORMED  Donnetta Gillin 09/03/2019, 3:07 PM

## 2019-09-03 NOTE — Consult Note (Signed)
Stroke Neurology Consult Note  Referring Physician: Dr Dagoberto Ligas    Reason for Consult: Acute MI in the setting of a recent stroke  HPI: Roger Ramirez is a 74 y.o. male with history of PE and antiphospholipid syndrome on Xarelto, a previous stroke in 2020, hypertension, hypothyroidism, and diabetes who was admitted to [facility] 08/10/19 as a code stroke for right-sided weakness, worse in lower extremity. He was found to have a left ACA infarct at left CC, possibly due to antiphospholipid syndrome despite being on Xarelto.  During his stay he was changed from [place] to Eliquis and eventually admitted to the [facility] on 08/16/19 for further therapy. Yesterday on rounds in the rehabilitation unit the patient noted chest pain. Dr Dagoberto Ligas obtained an ECG which was consistent with an acute MI. Dr Gwenlyn Found from Cardiology was contacted with initial plans to take the pt to the cath lab; however, due to the recent stroke, Eliquis therapy, and a DNR designation there were concerns with the risks involved. Dr Dagoberto Ligas contacted the patient's family to discuss the risks and benefits and a decision was made, by the family, to see how the pt progressed with an eye towards proceeding with cardiac cath if he began to deteriorate. Dr Gwenlyn Found suggested stopping the Eliquis and starting IV Heparin in case intervention was needed. Neurology was asked to follow up of pt and recommendation on [facility] options. Pt denies any worsening weakness, numbness, LOC, seizure, speech or vision changes.   Past Medical History Past Medical History:  Diagnosis Date  . Antiphospholipid syndrome ([place])   . CVA (cerebral vascular accident) ([place]) 06/07/2019  . Diabetes mellitus without complication ([place])   . Hypertension   . Hypothyroidism   . Pulmonary embolism [facility])     Surgical History History reviewed. No pertinent surgical history.  Family History  Family History  Problem Relation Age of Onset  . Hypertension Mother   .  Hypertension Father   . Dementia Maternal Grandmother   . Stroke Maternal Grandmother     Social History:   reports that he has quit smoking. His smoking use included cigarettes. He has a 20.00 pack-year smoking history. He has never used smokeless tobacco. He reports previous alcohol use. He reports that he does not use drugs.  Allergies:  No Known Allergies  Home Medications:  Medications Prior to Admission  Medication Sig Dispense Refill  . apixaban (ELIQUIS) 5 MG TABS tablet Take 1 tablet (5 mg total) by mouth 2 (two) times daily. 60 tablet   . atorvastatin (LIPITOR) 40 MG tablet Take 1 tablet (40 mg total) by mouth daily.    . irbesartan (AVAPRO) 150 MG tablet Take 75 mg by mouth daily.    Marland Kitchen levothyroxine (SYNTHROID) 75 MCG tablet Take 75 mcg by mouth daily.    . melatonin 3 MG TABS tablet Take 1 tablet (3 mg total) by mouth at bedtime as needed (sleep. prefer to give at 8 PM to avoid morning sleepiness).  0  . metFORMIN (GLUCOPHAGE) 1000 MG tablet Take 1,000 mg by mouth 2 (two) times daily.    . Omega-3 Fatty Acids (FISH OIL) 1200 MG CAPS Take 1,200 mg by mouth daily.    . vitamin B-12 1000 MCG tablet Take 1 tablet (1,000 mcg total) by mouth daily. 30 tablet 0    Hospital Medications . atorvastatin  40 mg Oral Daily  . docusate sodium  100 mg Oral BID  . feeding supplement (ENSURE ENLIVE)  237 mL Oral BID BM  .  insulin aspart  0-15 Units Subcutaneous TID WC  . irbesartan  75 mg Oral Daily  . levothyroxine  75 mcg Oral Daily  . melatonin  3 mg Oral QHS  . multivitamin with minerals  1 tablet Oral Daily    ROS: History obtained from the patient  General ROS: negative for - chills, fatigue, fever, night sweats, weight gain or weight loss Psychological ROS: negative for - behavioral disorder, hallucinations, memory difficulties, mood swings or suicidal ideation Ophthalmic ROS: negative for - blurry vision, double vision, eye pain or loss of vision ENT ROS: negative for -  epistaxis, nasal discharge, oral lesions, sore throat, tinnitus or vertigo Allergy and Immunology ROS: negative for - hives or itchy/watery eyes Hematological and Lymphatic ROS: negative for - bleeding problems, bruising or swollen lymph nodes Endocrine ROS: negative for - galactorrhea, hair pattern changes, polydipsia/polyuria or temperature intolerance Respiratory ROS: negative for - cough, hemoptysis, shortness of breath or wheezing Cardiovascular ROS: negative for - chest pain, dyspnea on exertion, edema or irregular heartbeat Gastrointestinal ROS: negative for - abdominal pain, diarrhea, hematemesis, nausea/vomiting or stool incontinence Genito-Urinary ROS: negative for - dysuria, hematuria, incontinence or urinary frequency/urgency Musculoskeletal ROS: negative for - joint swelling or muscular weakness Neurological ROS: as noted in HPI Dermatological ROS: negative for rash and skin lesion changes   Physical Examination:   Temp:  [98.1 F (36.7 C)-98.3 F (36.8 C)] 98.3 F (36.8 C) (05/23 1437) Pulse Rate:  [57-82] 72 (05/23 1437) Resp:  [16-20] 18 (05/23 1437) BP: (125-146)/(61-85) 129/72 (05/23 1437) SpO2:  [95 %-98 %] 98 % (05/23 1437)  General - Well nourished, well developed, in no apparent distress.  Ophthalmologic - fundi not visualized due to noncooperation.  Cardiovascular - Regular rhythm and rate.  Mental Status -  Level of arousal and orientation to time, place, and person were intact. Language including expression, naming, repetition, comprehension was assessed and found intact. Attention span and concentration were impaired, not able to spell WORLD backward. Fund of Knowledge was assessed and was impaired, not able to tell current and previous presidents  Cranial Nerves II - XII - II - Visual field intact OU. III, IV, VI - Extraocular movements intact. V - Facial sensation intact bilaterally. VII - Facial movement intact bilaterally. VIII - Hearing &  vestibular intact bilaterally. X - Palate elevates symmetrically. XI - Chin turning & shoulder shrug intact bilaterally. XII - Tongue protrusion intact.  Motor Strength - The patient's strength was symmetrical in all extremities and pronator drift was absent.  Bulk was normal and fasciculations were absent.   Motor Tone - Muscle tone was assessed at the neck and appendages and was normal.  Reflexes - The patient's reflexes were symmetrical in all extremities and he had no pathological reflexes.  Sensory - Light touch, temperature/pinprick were assessed and were symmetrical.    Coordination - The patient had normal movements in the hands and feet with no ataxia or dysmetria.  Tremor was absent.  Gait and Station - deferred.     LABORATORY STUDIES:  CBG: Recent Labs  Lab 09/02/19 0601 09/02/19 1141 09/02/19 1640 09/02/19 2134 09/03/19 0613  GLUCAP 105* 164* 180* 141* 128*    Lipid Panel:     Component Value Date/Time   CHOL 120 08/11/2019 0404   TRIG 139 08/11/2019 0404   HDL 35 (L) 08/11/2019 0404   CHOLHDL 3.4 08/11/2019 0404   VLDL 28 08/11/2019 0404   LDLCALC 57 08/11/2019 0404    HgbA1C:  Lab  Results  Component Value Date   HGBA1C 6.1 (H) 08/10/2019    ECG - SR rate 61 BPM c/w an acute inferior infarct.   IMAGING: No results found.   Assessment:  Roger Ramirez is a 74 y.o. male with history of a PE on Xarelto, antiphospholipid syndrome, a previous stroke in 2020, hypertension, hypothyroidism, diabetes and admission 08/10/19 with a left ACA stroke with right sided weakness. He was admitted to CIR on 08/16/19 but developed chest pain with an acute inferior MI on 09/02/19. He was subsequently readmitted to the NICU. Eliquis was stopped and he was placed on IV Heparin per the cardiologist's recommendation in case cardiac cath was indicated.  Acute STEMI  EKG showed acute inferior MI  Troponin 4,335-> greater than 27,000  Cardiology on board  Change  Eliquis to heparin IV  Plan for cardiac cath if needed  APL syndrome  Hx of PE  Xarelto changed to Eliquis in 08/2019, not able to switch to Pradaxa due to cost  Recommended to Coumadin if further thrombotic events last admission  Following with hematology  On heparin IV for current MI  Given stroke in 07/2019 and current MI, further antithrombotic regimen will need to be discussed with cardiology after cardiac cath.  From neurology standpoint, recommend Coumadin with INR 2.5-3.5.  History of stroke  05/2018 left CR small infarct, MRA neg and CUS neg. EF 50-55%. LDL 56 and A1C 8.4. put on Xarelto and ASA 81 along with lipitor 10  07/2019 admitted for right-sided weakness.  MRI showed acute left ACA territory infarct.  EF 60 to 65%.  LDL 57 and A1c 6.1.  Xarelto changed to Eliquis.  Not able to switch to Pradaxa due to cost.  Also recommended Coumadin if further thrombotic events.  Hypertension  Stable  Avoid low BP  BP goal normotensive  Hyperlipidemia  Home meds:  Atorvastatin 40, resumed in hospital  LDL 57, goal < 70  Continue statin at discharge  Diabetes type II Controlled  HgbA1c 6.1, goal < 7.0  CBGs  SSI  Close PCP follow up  Vascular dementia with behavioral Disturbance  Melatonin at hs  Monitor  Other Stroke Risk Factors  Advanced age  Obesity, Body mass index is 31.77 kg/m., recommend weight loss, diet and exercise as appropriate   Coronary artery disease - now with acute inferior MI.   Thank you for the consult, we will follow up with you.    Marvel Plan, MD PhD Stroke Neurology 09/03/2019 4:54 PM

## 2019-09-04 ENCOUNTER — Encounter (HOSPITAL_COMMUNITY): Payer: Self-pay | Admitting: Physical Medicine & Rehabilitation

## 2019-09-04 ENCOUNTER — Inpatient Hospital Stay (HOSPITAL_COMMUNITY): Payer: Medicare Other

## 2019-09-04 ENCOUNTER — Inpatient Hospital Stay (HOSPITAL_COMMUNITY): Payer: Medicare Other | Admitting: Speech Pathology

## 2019-09-04 ENCOUNTER — Inpatient Hospital Stay (HOSPITAL_COMMUNITY): Payer: Medicare Other | Admitting: Occupational Therapy

## 2019-09-04 DIAGNOSIS — I219 Acute myocardial infarction, unspecified: Secondary | ICD-10-CM

## 2019-09-04 DIAGNOSIS — I351 Nonrheumatic aortic (valve) insufficiency: Secondary | ICD-10-CM

## 2019-09-04 DIAGNOSIS — I2119 ST elevation (STEMI) myocardial infarction involving other coronary artery of inferior wall: Secondary | ICD-10-CM

## 2019-09-04 DIAGNOSIS — I350 Nonrheumatic aortic (valve) stenosis: Secondary | ICD-10-CM

## 2019-09-04 LAB — BASIC METABOLIC PANEL
Anion gap: 13 (ref 5–15)
BUN: 11 mg/dL (ref 8–23)
CO2: 27 mmol/L (ref 22–32)
Calcium: 9.6 mg/dL (ref 8.9–10.3)
Chloride: 100 mmol/L (ref 98–111)
Creatinine, Ser: 0.91 mg/dL (ref 0.61–1.24)
GFR calc Af Amer: >60 mL/min (ref >60)
GFR calc non Af Amer: >60 mL/min (ref >60)
Glucose, Bld: 107 mg/dL — ABNORMAL HIGH (ref 70–99)
Potassium: 4.3 mmol/L (ref 3.5–5.1)
Sodium: 140 mmol/L (ref 135–145)

## 2019-09-04 LAB — CBC
HCT: 42.4 % (ref 39.0–52.0)
Hemoglobin: 14 g/dL (ref 13.0–17.0)
MCH: 30.5 pg (ref 26.0–34.0)
MCHC: 33 g/dL (ref 30.0–36.0)
MCV: 92.4 fL (ref 80.0–100.0)
Platelets: 226 10*3/uL (ref 150–400)
RBC: 4.59 MIL/uL (ref 4.22–5.81)
RDW: 13.6 % (ref 11.5–15.5)
WBC: 6.8 10*3/uL (ref 4.0–10.5)
nRBC: 0 % (ref 0.0–0.2)

## 2019-09-04 LAB — GLUCOSE, CAPILLARY
Glucose-Capillary: 99 mg/dL (ref 70–99)
Glucose-Capillary: 102 mg/dL — ABNORMAL HIGH (ref 70–99)
Glucose-Capillary: 113 mg/dL — ABNORMAL HIGH (ref 70–99)
Glucose-Capillary: 113 mg/dL — ABNORMAL HIGH (ref 70–99)

## 2019-09-04 LAB — HEPARIN LEVEL (UNFRACTIONATED): Heparin Unfractionated: 0.76 IU/mL — ABNORMAL HIGH (ref 0.30–0.70)

## 2019-09-04 LAB — ECHOCARDIOGRAM COMPLETE
Height: 69 in
Weight: 3082.91 oz

## 2019-09-04 LAB — APTT: aPTT: 76 seconds — ABNORMAL HIGH (ref 24–36)

## 2019-09-04 MED ORDER — CLOPIDOGREL BISULFATE 75 MG PO TABS
75.0000 mg | ORAL_TABLET | Freq: Every day | ORAL | Status: DC
  Administered 2019-09-04 – 2019-09-11 (×8): 75 mg via ORAL
  Filled 2019-09-04 (×8): qty 1

## 2019-09-04 MED ORDER — WARFARIN - PHARMACIST DOSING INPATIENT: Freq: Every day | Status: DC

## 2019-09-04 MED ORDER — WARFARIN SODIUM 3 MG PO TABS
6.0000 mg | ORAL_TABLET | Freq: Once | ORAL | Status: AC
  Administered 2019-09-04: 6 mg via ORAL
  Filled 2019-09-04: qty 2

## 2019-09-04 MED ORDER — METOPROLOL SUCCINATE ER 25 MG PO TB24
25.0000 mg | ORAL_TABLET | Freq: Every day | ORAL | Status: DC
  Administered 2019-09-04 – 2019-09-11 (×8): 25 mg via ORAL
  Filled 2019-09-04 (×8): qty 1

## 2019-09-04 NOTE — Progress Notes (Signed)
ANTICOAGULATION CONSULT NOTE - Initial Consult  Pharmacy Consult for Coumadin Indication: hx APLS/PE/hx CVA w/ new CVA    No Known Allergies  Patient Measurements: Height: 5\' 9"  (175.3 cm) Weight: 87.4 kg (192 lb 10.9 oz) IBW/kg (Calculated) : 70.7 Heparin Dosing Weight:   Vital Signs: Temp: 98.3 F (36.8 C) (05/24 0423) BP: 144/67 (05/24 0423) Pulse Rate: 71 (05/24 0423)  Labs: Recent Labs    09/02/19 1224 09/02/19 1444 09/03/19 0218 09/03/19 1140 09/04/19 0724  HGB  --   --  13.3  --  14.0  HCT  --   --  40.4  --  42.4  PLT  --   --  234  --  226  APTT  --   --  65* 93* 76*  HEPARINUNFRC  --   --  1.00*  --  0.76*  CREATININE  --   --   --   --  0.91  TROPONINIHS 4,335* >27,000*  --   --   --     Estimated Creatinine Clearance: 75.6 mL/min (by C-G formula based on SCr of 0.91 mg/dL).   Medical History: Past Medical History:  Diagnosis Date  . Antiphospholipid syndrome ([facility])   . Cognitive deficits   . CVA (cerebral vascular accident) ([facility]) 2020   2020 and 07/2019  . Diabetes mellitus ([facility])   . Hypertension   . Hypothyroidism   . Moderate aortic stenosis   . Pulmonary embolism ([facility])   . Tobacco abuse     Assessment:  Anticoag: Xarelto PTA for hx APLS/PE/hx CVA w/ new CVA  - Neuro wanted Pradaxa but insurance would not cover. - Started Eliquis 5/5-5/22. Edu done w/ dtr before CIR AVS in. D/c ASA. - 5/22: hold apix and start IV heparin - 5/23: no additional chest pain reported.  - 5/24: HL 0.76 remains slightly elevated from residual Eliquis, aPTT 76 in goal. CBC WNL. INR may also be falsely elevated due to residual Eliquis, but start Coumadin today. INR goal 2-2.5  Goal of Therapy:  INR 2-2.5 Monitor platelets by anticoagulation protocol: Yes   Plan:  - Con't IV heparin until INR>=2 - Start Coumadin 6mg  po x1 today. - Add Daily INR   Roger Ramirez S. 06-16-1970, PharmD, BCPS Clinical Staff Pharmacist [email or web address] , Merilynn Finland 09/04/2019,12:19 PM

## 2019-09-04 NOTE — Progress Notes (Signed)
Speech Language Pathology Daily Session Note  Patient Details  Name: Roger Ramirez MRN: [id number] Date of Birth: January 05, 1946  Today's Date: 09/04/2019 SLP Individual Time: 0730-0828 SLP Individual Time Calculation (min): 58 min  Short Term Goals: Week 3: SLP Short Term Goal 1 (Week 3): Pt will recall 2 safety precaution with Mod A multimodal cues. SLP Short Term Goal 2 (Week 3): Pt will use external aids to recall daily information with Mod A. SLP Short Term Goal 3 (Week 3): Pt will demonstrate use word finding strategies in functional communication exchanges with Mod I. SLP Short Term Goal 4 (Week 3): Pt will demonstate ability to problem solve during basic and familiar tasks with Min A. SLP Short Term Goal 5 (Week 3): Pt will identify 2 cognitive and 2 physical impairment with Mod A multimodal cues.  Skilled Therapeutic Interventions: Pt was seen for skilled ST targeting cognition. SLP facilitated session by provided pt with a time constraint (30 minutes) and reducing environmental distractions in order to maximize pt's sustained attention to PO intake. Overall Min A verbal cues provided for redirection to task and re-initiate self feeding in moments of distractibility. Pt's daughter arrived and education provided regarding full supervision requirements and training to provide supervision when able. In functional conversation regarding safety, pt recalled 1 precaution with Max A verbal cues. He became increasingly fatigued near end of session. Pt left sitting in bed with alarm set and needs within reach. Continue per current plan of care.        Pain Pain Assessment Pain Scale: 0-10 Pain Score: 0-No pain  Therapy/Group: Individual Therapy  [name] 09/04/2019, 6:58 AM

## 2019-09-04 NOTE — Progress Notes (Addendum)
Occupational Therapy Session Note  Patient Details  Name: Roger Ramirez MRN: [id number] Date of Birth: Jan 14, 1946  Today's Date: 09/04/2019 OT Individual Time:  9:05am- 10:00am      Short Term Goals: Week 3:  OT Short Term Goal 1 (Week 3): Continue working on established LTGs set at [employer].  Skilled Therapeutic Interventions/Progress Updates:   Pt in bed upon OT arrival.  Pt agreeable to sinkside self care this session.  Pt noted with incontinence of urine episode leaking onto bed sheets therefore completed removal of brief and pericare at bed level.  Pt required min assist to roll left and right using bed rails and total assist to doff brief.  Instructed pt in washing front periarea with step by step VCs and total assist needed for bathing buttocks.  Supine to sit with min assist and mod VCs to facilitate correct body mechanics.  Pt completed SPT with min-mod assist to maintain balance and Max VCs/TCs to safely manage RW due to pt not attending to right side of RW.  Pt attempting to wash UB with dry wash cloth needing VCs for problem solving.  Pt frequently seeking visual affirmation of proper sequencing of tasks during UB/LB bathing.  OT provided intermittent simple VCs to facilitate more pt initiation and sequencing to next step.  Pt needing min assist for UB and LB bathing to wash back and hold RLE in figure 4 position to wash foot. Supervision to donn tshirt over head with nursing present to manage IV.  Mod assist to donn brief and pants for threading over feet and pt able to pull up over knees. On first attempt to STS, pt losing balance posteriorly with LLE sliding forward.  Instructed pt on improved positioning with second STS at sink, pt requiring min assist.  Pt completed oral hygiene and combing hair with supervision.  Re-orientation training to day of the week with pt requiring max VCs to identify correct day.  Pt exhibiting poor awareness of current functional capacity and need  for assist.  Pt sitting up in w/c, Telesitter on, seat belt alarm donned, needs within reach.  Therapy Documentation Precautions:  Precautions Precautions: Fall Precaution Comments: posterior and right lean in standing Restrictions Weight Bearing Restrictions: No   Pain: Pain Assessment Pain Scale: 0-10 Pain Score: 0-No pain ADL: ADL Eating: Set up Grooming: Supervision/safety Where Assessed-Grooming: Sitting at sink Upper Body Bathing: Maximal assistance Where Assessed-Upper Body Bathing: Edge of bed Lower Body Bathing: Dependent Where Assessed-Lower Body Bathing: Edge of bed Upper Body Dressing: Supervision/safety Where Assessed-Upper Body Dressing: Wheelchair Lower Body Dressing: Dependent Where Assessed-Lower Body Dressing: Edge of bed   Therapy/Group: Individual Therapy  Amie Critchley 09/04/2019, 1:08 PM

## 2019-09-04 NOTE — Progress Notes (Signed)
STROKE TEAM PROGRESS NOTE   INTERVAL HISTORY His physical therapist is at his side in the gym.  Patient has been found to have a myocardial infarction by cardiology but not felt to be candidate for emergent revascularization and conservative medical management with Plavix has been recommended.  Vitals:   09/03/19 0415 09/03/19 1437 09/03/19 1953 09/04/19 0423  BP: 130/61 129/72 125/66 (!) 144/67  Pulse: (!) 57 72 72 71  Resp: 16 18 17 16   Temp: 98.1 F (36.7 C) 98.3 F (36.8 C) 98.2 F (36.8 C) 98.3 F (36.8 C)  TempSrc:   Oral   SpO2: 97% 98% 98% 98%  Weight:      Height:        CBC:  Recent Labs  Lab 09/03/19 0218 09/04/19 0724  WBC 10.4 6.8  HGB 13.3 14.0  HCT 40.4 42.4  MCV 90.6 92.4  PLT 234 716    Basic Metabolic Panel: No results for input(s): NA, K, CL, CO2, GLUCOSE, BUN, CREATININE, CALCIUM, MG, PHOS in the last 168 hours. Lipid Panel:     Component Value Date/Time   CHOL 120 08/11/2019 0404   TRIG 139 08/11/2019 0404   HDL 35 (L) 08/11/2019 0404   CHOLHDL 3.4 08/11/2019 0404   VLDL 28 08/11/2019 0404   LDLCALC 57 08/11/2019 0404   HgbA1c:  Lab Results  Component Value Date   HGBA1C 6.1 (H) 08/10/2019   Urine Drug Screen: No results found for: LABOPIA, COCAINSCRNUR, LABBENZ, AMPHETMU, THCU, LABBARB  Alcohol Level No results found for: ETH  IMAGING past 24 hours No results found.  PHYSICAL EXAM Pleasant elderly Caucasian male not in distress. . Afebrile. Head is nontraumatic. Neck is supple without bruit.    Cardiac exam no murmur or gallop. Lungs are clear to auscultation. Distal pulses are well felt. Neurological Exam :    Awake  Alert oriented x 3.  Diminished attention, registration and recall.  Normal speech and language.eye movements full without nystagmus.fundi were not visualized. Vision acuity and fields appear normal. Hearing is normal. Palatal movements are normal. Face symmetric. Tongue midline. Normal strength, tone, reflexes and  coordination. Normal sensation. Gait deferred.   ASSESSMENT/PLAN Mr. Livan Hires is a 74 y.o. male with history of a PE on Xarelto, antiphospholipid syndrome, a previous stroke in 2020, hypertension, hypothyroidism, diabetes and admission 08/10/19 with a left ACA stroke with right sided weakness. He was admitted to CIR on 08/16/19 but developed chest pain with an acute inferior MI on 09/02/19. He was subsequently readmitted to the NICU. Eliquis was stopped and he was placed on IV Heparin per the cardiologist's recommendation in case cardiac cath was indicated.  Acute STEMI  EKG showed acute inferior MI  Troponin 4,335-> greater than 27,000  Cardiology on board  Changed Eliquis to heparin IV  Plan for cardiac cath if needed  Antiphospholipid Syndrome  Hx PE    Xarelto changed to Eliquis 08/2019 (no Pradaxa d/t cost)  Recommended warfarin during last admission if further thrombotic events  History ofstroke  05/2018 left CR small infarct, MRA neg and CUS neg. EF 50-55%. LDL 56 and A1C 8.4. put on Xarelto and ASA 81 along with lipitor 10  07/2019 admitted for right-sided weakness.  MRI showed acute left ACA territory infarct.  EF 60 to 65%.  LDL 57 and A1c 6.1.  Xarelto changed to Eliquis.  Not able to switch to Pradaxa due to cost.  Also recommended Coumadin if further thrombotic events.  Hypertension  Stable  Avoid  low BP  BP goal normotensive  Hyperlipidemia  Home meds: lipitor and fish oil, resumed in hospital  LDL 57, goal < 70  Continue statin at discharge  Diabetes type II Uncontrolled  HgbA1c 6.1, goal < 7.0  CBGs Recent Labs    09/03/19 1636 09/03/19 2106 09/04/19 0612  GLUCAP 152* 117* 99      SSI  Close PCP follow up  Vascular dementia with behavioral Disturbance  Melatonin at hs  Monitor  Other Stroke Risk Factors  Advanced age  Former Cigarette smoker  Hx ETOH use  Overweight, Body mass index is 28.45 kg/m., recommend weight  loss, diet and exercise as appropriate   Hx stroke/TIA   Family hx stroke (maternal grandmother)  Coronary artery disease - now with acute inferior MI  Other Active Problems  Hypothyroidism on synthroid  Hospital day # 19  Patient has had acute myocardial infarction now while on Eliquis and needs addition of antiplatelet therapy for medical management and agree with adding Plavix.  Continue IV heparin but start warfarin with target INR 2-3.  Discussed with patient, therapist and Dr. Wynn Banker and answered questions.  Greater than 50% time during this 25-minute visit was spent on counseling and coordination of care and discussion with care team.  Stroke team will sign off.  Kindly call for questions. Delia Heady, MD To contact Stroke Continuity provider, please refer to [email or web address]. After hours, contact General Neurology

## 2019-09-04 NOTE — Progress Notes (Addendum)
[facility] PHYSICAL MEDICINE & REHABILITATION PROGRESS NOTE   Subjective/Complaints:  Appreciate events of the weekend, appreciate Neuro assist Pt does not recall having MI until he was remeinded Seen during SLP session , no change in mentation or swallowing      ROS: Pt denies SOB, abd pain, CP, N/V/C/D, no increased weakness       Objective:   No results found. Recent Labs    09/03/19 0218  WBC 10.4  HGB 13.3  HCT 40.4  PLT 234   No results for input(s): NA, K, CL, CO2, GLUCOSE, BUN, CREATININE, CALCIUM in the last 72 hours.  Intake/Output Summary (Last 24 hours) at 09/04/2019 0814 Last data filed at 09/03/2019 1837 Gross per 24 hour  Intake 942.12 ml  Output 600 ml  Net 342.12 ml     Physical Exam: Vital Signs Blood pressure (!) 144/67, pulse 71, temperature 98.3 F (36.8 C), resp. rate 16, height 5\' 9"  (1.753 m), weight 87.4 kg, SpO2 98 %.   General: No acute distress Mood and affect are appropriate Heart: Regular rate and rhythm no rubs murmurs or extra sounds Lungs: Clear to auscultation, breathing unlabored, no rales or wheezes Abdomen: Positive bowel sounds, soft nontender to palpation, nondistended Extremities: No clubbing, cyanosis, or edema Skin: No evidence of breakdown, no evidence of rash Neurologic: slow processing oriented to person and place  . Cranial nerves II through XII intact, motor strength is 5/5 in Bilateral  deltoid, bicep, tricep, grip, 5/5 left and 3/5 Right hip flexor, knee extensors, ankle dorsiflexor and plantar flexor Functional mobility: posterior lean in standing Musculoskeletal: Full range of motion in all 4 extremities. No joint swelling  Assessment/Plan: 1. Functional deficits secondary to left ACA infarct which require 3+ hours per day of interdisciplinary therapy in a comprehensive inpatient rehab setting.  Physiatrist is providing close team supervision and 24 hour management of active medical problems listed  below.  Physiatrist and rehab team continue to assess barriers to discharge/monitor patient progress toward functional and medical goals  Care Tool:  Bathing  Bathing activity did not occur: Safety/medical concerns Body parts bathed by patient: Right arm, Left arm, Chest, Abdomen, Right upper leg, Left upper leg, Right lower leg, Left lower leg, Face   Body parts bathed by helper: Front perineal area, Buttocks     Bathing assist Assist Level: Moderate Assistance - Patient 50 - 74%     Upper Body Dressing/Undressing Upper body dressing   What is the patient wearing?: Pull over shirt    Upper body assist Assist Level: Supervision/Verbal cueing    Lower Body Dressing/Undressing Lower body dressing      What is the patient wearing?: Pants, Incontinence brief     Lower body assist Assist for lower body dressing: Moderate Assistance - Patient 50 - 74%     Toileting Toileting    Toileting assist Assist for toileting: Moderate Assistance - Patient 50 - 74%     Transfers Chair/bed transfer  Transfers assist  Chair/bed transfer activity did not occur: Safety/medical concerns  Chair/bed transfer assist level: Moderate Assistance - Patient 50 - 74%     Locomotion Ambulation   Ambulation assist   Ambulation activity did not occur: Safety/medical concerns  Assist level: Moderate Assistance - Patient 50 - 74% Assistive device: Walker-rolling Max distance: 20'   Walk 10 feet activity   Assist  Walk 10 feet activity did not occur: Safety/medical concerns  Assist level: Moderate Assistance - Patient - 50 - 74% Assistive device:  Walker-rolling   Walk 50 feet activity   Assist Walk 50 feet with 2 turns activity did not occur: Safety/medical concerns  Assist level: Moderate Assistance - Patient - 50 - 74% Assistive device: Walker-rolling    Walk 150 feet activity   Assist Walk 150 feet activity did not occur: Safety/medical concerns         Walk 10  feet on uneven surface  activity   Assist Walk 10 feet on uneven surfaces activity did not occur: Safety/medical concerns         Wheelchair     Assist Will patient use wheelchair at discharge?: (TBD)             Wheelchair 50 feet with 2 turns activity    Assist            Wheelchair 150 feet activity     Assist          Blood pressure (!) 144/67, pulse 71, temperature 98.3 F (36.8 C), resp. rate 16, height 5\' 9"  (1.753 m), weight 87.4 kg, SpO2 98 %.  Medical Problem List and Plan: 1.  Right side weakness with dysarthria secondary to left ACA territory acute infarction as well as history of CVA 2020 with cognitive deficits Cognition and RLE weakness are main issues              -patient may shower   Continue CIR PT, OT, SLP reduced schedule, ? Exercise tolerance post MI   5/22- Acute inferior MI based on EKG- Troponin 4.335; f/u troponin >27k- no note from cardiology over the weekend, discussed with cardiology PA today ? Need for [facility] by cardiology ? Need for antiplatelet agent, Plavix agreed upon by neuro and cardiology ? Need for cath, per cardiology none   2.  Antithrombotics: -DVT/anticoagulation: Heparin Has  antiphospholipid antibody syndrome with history of DVT/PE.  Consultation with neuro as well as cardiology, currently on IV heparin, will ask pharmacy to consult on warfarin dosing with INR goal of 2.0-2.5.  Also will add Plavix 75 mg/day 3. Pain Management: Tylenol as needed. Well controlled 4. Mood: Melatonin 3 mg nightly             -antipsychotic agents: N/A 5. Neuropsych: This patient is capable of making decisions on his own behalf. Poor concentration attention, initiation , as discussed with pt , methylphenidate trial not helpful, have discontinued   6. Skin/Wound Care: Routine skin checks 7. Fluids/Electrolytes/Nutrition: Routine in and outs. CMP ordered. 8.  History of saddle pulmonary emboli.  Continue Eliquis 9.   Diabetes mellitus.  Hemoglobin A1c 6.1.  SSI.  Patient on Glucophage 1000 mg twice daily prior to admission.  Resume as needed CBG (last 3)  Recent Labs    09/03/19 1636 09/03/19 2106 09/04/19 0612  GLUCAP 152* 117* 99       5/23- BGs controlled- con't meds 10.  Hypertension.  Avapro 75 mg daily.           Vitals:   09/03/19 1953 09/04/19 0423  BP: 125/66 (!) 144/67  Pulse: 72 71  Resp: 17 16  Temp: 98.2 F (36.8 C) 98.3 F (36.8 C)  SpO2: 98% 98%   5/23- BP and pulse controlled  11.  Hypothyroidism. Continue Synthroid 12.  Hyperlipidemia. Lipitor 13.  History of tobacco abuse.  Counseling  14. Acute inferior MI- CP resolved - on IV heparin, hx APA, Neuro rec warfarin   5/22- detailed issues as above- given additional ASA 81 mg and 1 NTG-  pain resolved after NTG- no more Sx's- vitals stable- asked staff to call Code STEMI if any additional chest pain- Dr Gwenlyn Found, Cards, aware of pt and issues. Of note, pt is DNR- family discussed POSSIBLY rescinding DNR for cath lab- however was NOT clear on plan. At this time have decided AGAINST cath lab and want to treat medically- no additional BP meds because BP is in good control.    LOS: 19 days A FACE TO [place] Anslie Spadafora 09/04/2019, 8:14 AM

## 2019-09-04 NOTE — Progress Notes (Signed)
Physical Therapy Session Note  Patient Details  Name: Roger Ramirez MRN: [id number] Date of Birth: 12-22-1945  Today's Date: 09/04/2019 PT Individual Time: 1130-1200 and 1330-1412 PT Individual Time Calculation (min): 30 min and 42 min    Short Term Goals: Week 3:  PT Short Term Goal 1 (Week 3): = to LTGs based on ELOS  Skilled Therapeutic Interventions/Progress Updates:    Session 1:  Pt seated in w/c upon PT arrival, agreeable to therapy tx and denies pain. Pt with some confusion, requiring reorientation this session. Pt transported to the gym in w/c. Pt performed stand pivot to the mat with RW and mod assist, posterior lean with cues to correct and cues for foot placement. Pt worked on standing balance and foot clearance to perform toe taps on aerobic step x 2 trials with UE support on RW. Pt reports that he "just pooped." Pt transported back to the room in w/c and used stedy for sit<>stands min assist while therapist assisted with clothing management, pericare, and changing brief. Pt left in w/c with needs in reach and chair alarm set at end of session.   Session 2: Pt seated in w/c upon PT arrival, agreeable to therapy tx and denies pain. Pt continues to have difficulty with orientation, "We are right here," "I has some kind of problem." Therapist reoriented pt and explained where we are/why. Pt transported to the gym in w/c. During patients first sit<>stand, pt with complete LOB back into w/c secondary to posterior lean. Pt ambulated x 25 ft this session with RW and mod assist, cues for increased step length, cues for RW management and upright posture. Pt worked on standing balance with single UE support while performing horseshoe toss activity, increased time to completed with slow initiation of throwing task while standing, finished task while sitting. Pt ambulated x 30 ft this session with RW and min assist, continued cues for step length and attention. Pt performed 2 x 10 step ups on the 6  inch step this session using B handrails working on endurance and LE strengthening, cues for techniques, min assist. Pt transported back to room at end of session and transferred to bed with min assist using RW. Sit>supine min assist, left with needs in reach and bed alarm set.    Therapy Documentation Precautions:  Precautions Precautions: Fall Precaution Comments: posterior and right lean in standing Restrictions Weight Bearing Restrictions: No   Therapy/Group: Individual Therapy  Cresenciano Genre, PT, DPT, CSRS 09/04/2019, 7:46 AM

## 2019-09-04 NOTE — Progress Notes (Signed)
ANTICOAGULATION CONSULT NOTE - Follow Up Consult  Pharmacy Consult for Heparin Indication: APLS/PE/hx CVA w/ new CVA and CP  No Known Allergies  Patient Measurements: Height: 5\' 9"  (175.3 cm) Weight: 87.4 kg (192 lb 10.9 oz) IBW/kg (Calculated) : 70.7 Heparin Dosing Weight: 87.4 kg  Vital Signs: Temp: 98.3 F (36.8 C) (05/24 0423) BP: 144/67 (05/24 0423) Pulse Rate: 71 (05/24 0423)  Labs: Recent Labs    09/02/19 1224 09/02/19 1444 09/03/19 0218 09/03/19 1140 09/04/19 0724  HGB  --   --  13.3  --  14.0  HCT  --   --  40.4  --  42.4  PLT  --   --  234  --  226  APTT  --   --  65* 93* 76*  HEPARINUNFRC  --   --  1.00*  --  0.76*  CREATININE  --   --   --   --  0.91  TROPONINIHS 4,335* >27,000*  --   --   --     Estimated Creatinine Clearance: 75.6 mL/min (by C-G formula based on SCr of 0.91 mg/dL).    Assessment: Anticoag: Xarelto PTA for hx APLS/PE/hx CVA w/ new CVA  - Neuro wanted Pradaxa but insurance would not cover. - Started Eliquis 5/5-5/22. Edu done w/ dtr before CIR AVS in. D/c ASA. - 5/22: hold apix>hep gtt for ACS started at 5/23 2000 - 5/23: no additional chest pain reported. "at least a few days- per Neuro, they want to change to COumadin, but not clear what Cards wants to do.  Family does not want cath - 5/24: HL 0.76 remains slightly elevated from residual Eliquis, aPTT 76 in goal. CBC WNL.   Goal of Therapy:  aPTT 66-102 seconds Monitor platelets by anticoagulation protocol: Yes   Plan:  - Hep for ACS at 1150 units/hour - Daily HL, CBC and aPTT - Holding apixaban >> follow-up resuming apix vs coumadin   Jaquelinne Glendening S. 06-16-1970, PharmD, BCPS Clinical Staff Pharmacist [email or web address] Merilynn Finland, Pollyann Roa Stillinger 09/04/2019,9:05 AM

## 2019-09-04 NOTE — Progress Notes (Signed)
*  PRELIMINARY RESULTS* Echocardiogram 2D Echocardiogram has been performed.  Stacey Drain 09/04/2019, 3:15 PM

## 2019-09-04 NOTE — Consult Note (Signed)
Cardiology Consultation:   Patient ID: Roger Ramirez MRN: [id number]; DOB: 06/28/1945  Admit date: 08/16/2019 Date of Consult: 09/04/2019  Primary Care Provider: Renford Dills, MD Primary Cardiologist: Peter Swaziland, MD  Primary Electrophysiologist:  None    Patient Profile:   Roger Ramirez is a 74 y.o. male with a hx of saddle PE from presumed antiphospholipid antibody syndrome on anticoagulation prior to admission, tobacco abuse, HTN, DM, CVA 05/2018 with reported cognitive deficits with recent recurrent CVA 08/10/19, moderate AS by echo 07/2019 who is being seen today for the evaluation of myocardial infarction at the request of Dr. Bertram Gala.  History of Present Illness:   Roger Ramirez has no formal prior cardiac history. He presented to the hospital recently on 08/10/19 with right hemiparesis, dysarthria and AMS. CT head was nonacute. MRI showed acute infarction affecting the left body of the corpus callosum. CT angiogram head and neck with atherosclerotic irregularity of the left A3 ACA with high-grade stenosis with suspected subsequent occlusion. Echocardiogram showed EF 60-65%, grade 1 DD, elevated LVEDP, moderate aortic stenosis. Patient did not receive TPA. He was changed to Eliquis + ASA due to insurance coverage with recommendation to consider Coumadin with higher INR goal if he had a recurrent stroke in the future. DC summary stated once on Eliquis that aspirin was discontinued due to no additional benefit but increased risk of bleeding. He was discharged to CIR on 08/16/19. Post-stroke he has continued to have significant cognitive impairments impacting his mobility as well as right hemiparesis.   On 09/02/19, the patient complained of chest pain and mild nausea. EKG revealed inferior ST elevation. He received 81mg  ASA. The rehab medicine team spoke with cardiology team/Dr. who said they could take to cath lab but due to DNR status & recent CVA would be at higher risk of stroke on table  and asked the rehab medicine team to speak with the patient's family. Per the discussions that ensued, the family decided not to put patient through increase risk of stroke/hemorrhage and conservative management was employed. Eliquis was held and he was placed on heparin drip in case of clinical deterioration. He has remained stable clinically since that time. Troponin trended 4335 -> 27k. Labs otherwise show normal Hgb, creatinine; recent LFTs wnl, LDL 57. Neurology's last note indicates, "Given stroke in 07/2019 and current MI, further antithrombotic regimen will need to be discussed with cardiology after cardiac cath.  From neurology standpoint, recommend Coumadin with INR 2.5-3.5."  The patient is seen today in his room by himself. His answers are very brief, mostly yes/no. He does not recall ever having chest pain and has no complaints today. He denies any CP or SOB. No edema. He indicates Missy and 08/2019 are his children.   Past Medical History:  Diagnosis Date  . Antiphospholipid syndrome (HCC)   . Cognitive deficits   . CVA (cerebral vascular accident) (HCC) 2020   2020 and 07/2019  . Diabetes mellitus (HCC)   . Hypertension   . Hypothyroidism   . Moderate aortic stenosis   . Pulmonary embolism (HCC)   . Tobacco abuse     History reviewed. No pertinent surgical history. Patient does not recall.  Home Medications:  Prior to Admission medications   Medication Sig Start Date End Date Taking? Authorizing Provider  apixaban (ELIQUIS) 5 MG TABS tablet Take 1 tablet (5 mg total) by mouth 2 (two) times daily. 08/16/19   10/16/19, MD  atorvastatin (LIPITOR) 40 MG tablet Take 1 tablet (40 mg  total) by mouth daily. 08/17/19   Dorcas Carrow, MD  irbesartan (AVAPRO) 150 MG tablet Take 75 mg by mouth daily. 03/29/18   [provider]  levothyroxine (SYNTHROID) 75 MCG tablet Take 75 mcg by mouth daily. 05/21/19   [provider]  melatonin 3 MG TABS tablet Take 1 tablet (3 mg  total) by mouth at bedtime as needed (sleep. prefer to give at 8 PM to avoid morning sleepiness). 08/16/19   Dorcas Carrow, MD  metFORMIN (GLUCOPHAGE) 1000 MG tablet Take 1,000 mg by mouth 2 (two) times daily. 06/06/18   [provider]  Omega-3 Fatty Acids (FISH OIL) 1200 MG CAPS Take 1,200 mg by mouth daily.    [provider]  vitamin B-12 1000 MCG tablet Take 1 tablet (1,000 mcg total) by mouth daily. 06/08/18   Albertine Grates, MD    Inpatient Medications: Scheduled Meds: . atorvastatin  40 mg Oral Daily  . docusate sodium  100 mg Oral BID  . feeding supplement (ENSURE ENLIVE)  237 mL Oral BID BM  . insulin aspart  0-15 Units Subcutaneous TID WC  . irbesartan  75 mg Oral Daily  . levothyroxine  75 mcg Oral Daily  . melatonin  3 mg Oral QHS  . multivitamin with minerals  1 tablet Oral Daily   Continuous Infusions: . sodium chloride Stopped (09/02/19 1536)  . heparin 1,150 Units/hr (09/03/19 2320)   PRN Meds: sodium chloride, acetaminophen **OR** acetaminophen, bisacodyl, HYDROcodone-acetaminophen, nitroGLYCERIN, ondansetron **OR** ondansetron (ZOFRAN) IV, polyethylene glycol, sorbitol  Allergies:   No Known Allergies  Social History:   Social History   Socioeconomic History  . Marital status: Divorced    Spouse name: Not on file  . Number of children: Not on file  . Years of education: Not on file  . Highest education level: Not on file  Occupational History  . Occupation: retired  Tobacco Use  . Smoking status: Former Smoker    Packs/day: 1.00    Years: 20.00    Pack years: 20.00    Types: Cigarettes  . Smokeless tobacco: Never Used  . Tobacco comment: Quit 1970's  Substance and Sexual Activity  . Alcohol use: Not Currently    Alcohol/week: 0.0 standard drinks    Comment: Occasional beer / 1 every 2 or 3 weeks  . Drug use: No  . Sexual activity: Not on file  Other Topics Concern  . 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 Difficulty of Paying Living Expenses:   Food Insecurity:   . Worried About Programme researcher, broadcasting/film/video in the Last Year:   . Barista in the Last Year:   Transportation Needs:   . Freight forwarder (Medical):   Marland Kitchen Lack of Transportation (Non-Medical):   Physical Activity:   . Days of Exercise per Week:   . Minutes of Exercise per Session:   Stress:   . Feeling of Stress :   Social Connections:   . Frequency of Communication with Friends and Family:   . Frequency of Social Gatherings with Friends and Family:   . Attends Religious Services:   . Active Member of Clubs or Organizations:   . Attends Banker Meetings:   Marland Kitchen Marital Status:   Intimate Partner Violence:   .  Fear of Current or Ex-Partner:   . Emotionally Abused:   Marland Kitchen Physically Abused:   . Sexually Abused:     Family History:   Family History  Problem Relation Age of Onset  . Hypertension Mother   . Hypertension Father   . Dementia Maternal Grandmother   . Stroke Maternal Grandmother      ROS:  Please see the history of present illness.  All other ROS reviewed and negative.     Physical Exam/Data:   Vitals:   09/03/19 0415 09/03/19 1437 09/03/19 1953 09/04/19 0423  BP: 130/61 129/72 125/66 (!) 144/67  Pulse: (!) 57 72 72 71  Resp: 16 18 17 16   Temp: 98.1 F (36.7 C) 98.3 F (36.8 C) 98.2 F (36.8 C) 98.3 F (36.8 C)  TempSrc:   Oral   SpO2: 97% 98% 98% 98%  Weight:      Height:        Intake/Output Summary (Last 24 hours) at 09/04/2019 1026 Last data filed at 09/03/2019 1837 Gross per 24 hour  Intake 942.12 ml  Output 600 ml  Net 342.12 ml   Last 3 Weights 08/25/2019 08/16/2019 08/12/2019  Weight (lbs) 192 lb 10.9 oz 206 lb 6.4 oz 215 lb 2.7 oz  Weight (kg) 87.4 kg 93.622 kg 97.6 kg     Body mass index is 28.45 kg/m.  General: Well  developed elderly WM in no acute distress. Head: Normocephalic, atraumatic, sclera non-icteric, no xanthomas, nares are without discharge. Neck: Negative for carotid bruits. JVP not elevated. Lungs: Clear bilaterally to auscultation without wheezes, rales, or rhonchi. Breathing is unlabored. Heart: RRR S1 S2 without murmurs, rubs, or gallops.  Abdomen: Soft, non-tender, non-distended with normoactive bowel sounds. No rebound/guarding. Extremities: No clubbing or cyanosis. No edema. Distal pedal pulses are 2+ and equal bilaterally. Neuro: Alert and oriented to [facility], self - not year, date, or month. Follows basic commands but with slow processing. Grip strength equal bilaterally Psych:  Flat affect   EKG:  The EKG was personally reviewed and demonstrates:  09/02/19 at 11:50 - NSR 61bpm with acute ST elevation II, III, avF up to 1.57mm in lead III with reciprocal ST depression I, avL (repeat EKG at 12pm appeared similar) 9:48: NSR 80bpm, left axis deviation, scooped ST/TWI inferiorly with subtle ST sagging I, avL, QTc 437ms  Telemetry:  Telemetry was personally reviewed and demonstrates: N/A  Relevant CV Studies: Pre-MI Echo 08/10/19 1. Left ventricular ejection fraction, by estimation, is 60 to 65%. The  left ventricle has normal function. The left ventricle has no regional  wall motion abnormalities. Left ventricular diastolic parameters are  consistent with Grade I diastolic  dysfunction (impaired relaxation). Elevated left ventricular end-diastolic  pressure.  2. Right ventricular systolic function is normal. The right ventricular  size is normal.  3. The mitral valve is normal in structure. Trivial mitral valve  regurgitation. No evidence of mitral stenosis.  4. The aortic valve was not well visualized. Aortic valve regurgitation  is not visualized. Moderate aortic valve stenosis. Aortic valve mean  gradient measures 23.7 mmHg. Aortic valve Vmax measures 3.19 m/s.  5. The inferior  vena cava is normal in size with greater than 50%  respiratory variability, suggesting right atrial pressure of 3 mmHg.   Laboratory Data:  High Sensitivity Troponin:   Recent Labs  Lab 09/02/19 1224 09/02/19 1444  TROPONINIHS 4,335* >27,000*     Chemistry Recent Labs  Lab 09/04/19 0724  NA 140  K 4.3  CL  100  CO2 27  GLUCOSE 107*  BUN 11  CREATININE 0.91  CALCIUM 9.6  GFRNONAA >60  GFRAA >60  ANIONGAP 13    No results for input(s): PROT, ALBUMIN, AST, ALT, ALKPHOS, BILITOT in the last 168 hours. Hematology Recent Labs  Lab 09/03/19 0218 09/04/19 0724  WBC 10.4 6.8  RBC 4.46 4.59  HGB 13.3 14.0  HCT 40.4 42.4  MCV 90.6 92.4  MCH 29.8 30.5  MCHC 32.9 33.0  RDW 13.3 13.6  PLT 234 226   BNPNo results for input(s): BNP, PROBNP in the last 168 hours.  DDimer No results for input(s): DDIMER in the last 168 hours.   Radiology/Studies:  No results found.     TIMI Risk Score for ST  Elevation MI:   The patient's TIMI risk score is 4, which indicates a 7.3% risk of all cause mortality at 30 days.    Assessment and Plan:   1. Recent recurrent CVA (with history of antiphospholipid antibody syndrome and saddle PE) - was on Xarelto prior to recent admission, discharged to CIR on Eliquis. Neurology now recommending consideration of switching to Coumadin with higher INR goal in the context of MI. Remains on heparin for now. Still with significant cognitive/memory deficits.  2. Acute inferior STEMI - had chest pain and EKG changes on 09/02/19, managed conservatively per notes indicating discussion between rehab team/Dr. Allyson Sabal and then family. Will discuss with Dr. Swaziland regarding further workup. Will also need to involve neurology in plans given their input about potential change to Coumadin. Will repeat echocardiogram to assess post-MI LV function. Continue statin. Will discuss BB with MD - HR mostly 60s and above, one episode of 57 per VS review.  3. Moderate aortic  stenosis - continue to follow clinically for now. Anticipate conservative approach. No indication for intervention.  4. Essential HTN - blood pressure reasonably controlled the last few checks, mostly in the 120s systolic with last check 144/67. Continue to monitor.  5. HLD - last LDL at goal. Continue statin.  For questions or updates, please contact [facility] Please consult [email or web address] for contact info under     Signed, Laurann Montana, PA-C  09/04/2019 10:26 AM

## 2019-09-05 ENCOUNTER — Inpatient Hospital Stay (HOSPITAL_COMMUNITY): Payer: Medicare Other | Admitting: Physical Therapy

## 2019-09-05 ENCOUNTER — Inpatient Hospital Stay (HOSPITAL_COMMUNITY): Payer: Medicare Other | Admitting: Speech Pathology

## 2019-09-05 ENCOUNTER — Inpatient Hospital Stay (HOSPITAL_COMMUNITY): Payer: Medicare Other | Admitting: Occupational Therapy

## 2019-09-05 DIAGNOSIS — I213 ST elevation (STEMI) myocardial infarction of unspecified site: Secondary | ICD-10-CM

## 2019-09-05 LAB — CBC
HCT: 42.2 % (ref 39.0–52.0)
Hemoglobin: 13.9 g/dL (ref 13.0–17.0)
MCH: 30.3 pg (ref 26.0–34.0)
MCHC: 32.9 g/dL (ref 30.0–36.0)
MCV: 92.1 fL (ref 80.0–100.0)
Platelets: 241 10*3/uL (ref 150–400)
RBC: 4.58 MIL/uL (ref 4.22–5.81)
RDW: 13.6 % (ref 11.5–15.5)
WBC: 8.7 10*3/uL (ref 4.0–10.5)
nRBC: 0 % (ref 0.0–0.2)

## 2019-09-05 LAB — APTT: aPTT: 69 seconds — ABNORMAL HIGH (ref 24–36)

## 2019-09-05 LAB — HEPARIN LEVEL (UNFRACTIONATED): Heparin Unfractionated: 0.8 IU/mL — ABNORMAL HIGH (ref 0.30–0.70)

## 2019-09-05 LAB — GLUCOSE, CAPILLARY
Glucose-Capillary: 107 mg/dL — ABNORMAL HIGH (ref 70–99)
Glucose-Capillary: 148 mg/dL — ABNORMAL HIGH (ref 70–99)
Glucose-Capillary: 103 mg/dL — ABNORMAL HIGH (ref 70–99)
Glucose-Capillary: 120 mg/dL — ABNORMAL HIGH (ref 70–99)

## 2019-09-05 LAB — PROTIME-INR
INR: 1 (ref 0.8–1.2)
Prothrombin Time: 13 seconds (ref 11.4–15.2)

## 2019-09-05 MED ORDER — WARFARIN SODIUM 3 MG PO TABS
6.0000 mg | ORAL_TABLET | Freq: Once | ORAL | Status: AC
  Administered 2019-09-05: 6 mg via ORAL
  Filled 2019-09-05: qty 2

## 2019-09-05 NOTE — Plan of Care (Signed)
  Problem: RH Problem Solving Goal: LTG Patient will demonstrate problem solving for (SLP) Description: LTG:  Patient will demonstrate problem solving for basic/complex daily situations with cues  (SLP) Flowsheets (Taken 09/05/2019 1356) LTG Patient will demonstrate problem solving for: Moderate Assistance - Patient 50 - 74%   Problem: RH Memory Goal: LTG Patient will use memory compensatory aids to (SLP) Description: LTG:  Patient will use memory compensatory aids to recall biographical/new, daily complex information with cues (SLP) Flowsheets (Taken 09/05/2019 1356) LTG: Patient will use memory compensatory aids to (SLP): Maximal Assistance - Patient 25 - 49%   Problem: RH Awareness Goal: LTG: Patient will demonstrate awareness during functional activites type of (SLP) Description: LTG: Patient will demonstrate awareness during functional activites type of (SLP) Flowsheets (Taken 09/05/2019 1356) LTG: Patient will demonstrate awareness during cognitive/linguistic activities with assistance of (SLP): Maximal Assistance - Patient 25 - 49%  Goals downgraded due to slower than anticipated progress

## 2019-09-05 NOTE — Progress Notes (Signed)
Patient ID: Roger Ramirez, male   DOB: Aug 14, 1945, 74 y.o.   MRN: [id number]  Bed offer made from [facility] and [facility]

## 2019-09-05 NOTE — Progress Notes (Signed)
Patient ID: Roger Ramirez, male   DOB: 04-30-1945, 74 y.o.   MRN: [id number]  Bed offer accepted for [facility] for daughter. Sw will follow up with questions and concerns

## 2019-09-05 NOTE — Progress Notes (Signed)
ANTICOAGULATION CONSULT NOTE - Follow Up Consult  Pharmacy Consult for Heparin + Coumadin Indication: APLS/PE/hx CVA w/ new CVA and CP  No Known Allergies  Patient Measurements: Height: 5\' 9"  (175.3 cm) Weight: 87.4 kg (192 lb 10.9 oz) IBW/kg (Calculated) : 70.7 Heparin Dosing Weight: 87.4 kg  Vital Signs: Temp: 98.3 F (36.8 C) (05/25 0445) BP: 103/64 (05/25 0445) Pulse Rate: 56 (05/25 0445)  Labs: Recent Labs    09/02/19 1224 09/02/19 1444 09/03/19 0218 09/03/19 0218 09/03/19 1140 09/04/19 0724 09/05/19 0435  HGB  --   --  13.3   < >  --  14.0 13.9  HCT  --   --  40.4  --   --  42.4 42.2  PLT  --   --  234  --   --  226 241  APTT  --   --  65*   < > 93* 76* 69*  LABPROT  --   --   --   --   --   --  13.0  INR  --   --   --   --   --   --  1.0  HEPARINUNFRC  --   --  1.00*  --   --  0.76* 0.80*  CREATININE  --   --   --   --   --  0.91  --   TROPONINIHS 4,335* >27,000*  --   --   --   --   --    < > = values in this interval not displayed.    Estimated Creatinine Clearance: 75.6 mL/min (by C-G formula based on SCr of 0.91 mg/dL).    Assessment: Anticoag: Xarelto PTA for hx APLS/PE/hx CVA + new CVA  - Neuro wanted Pradaxa but insurance would not cover. - Started Eliquis 5/5-5/22. Edu done w/ dtr before CIR AVS in. D/c ASA. - 5/22: hold apix and start IV heparin for CP - 5/24: Start Coumadin.  - 5/25: HL 0.8 elevated from Eliquis, aPTT 69 in goal. INR 1. CBC WNL.  INR goal 2-2.5   Goal of Therapy:  aPTT 66-102 seconds Monitor platelets by anticoagulation protocol: Yes  INR 2-2.5   Plan:  - Hep at 1150 units/hour until INR>=2 - Daily HL, CBC and aPTT, INR - Start Coumadin 6mg  po x1 today.   Kenneth Cuaresma S. 01-14-1977, PharmD, BCPS Clinical Staff Pharmacist [email or web address] , Merilynn Finland 09/05/2019,8:29 AM

## 2019-09-05 NOTE — Progress Notes (Signed)
Physical Therapy Session Note  Patient Details  Name: Roger Ramirez MRN: [id number] Date of Birth: October 13, 1945  Today's Date: 09/05/2019 PT Individual Time: 0905-1020 PT Individual Time Calculation (min): 75 min   Short Term Goals: Week 3:  PT Short Term Goal 1 (Week 3): = to LTGs based on ELOS  Skilled Therapeutic Interventions/Progress Updates: Pt presented in bed agreeable to therapy. Pt denies pain during session. Pt noted to have soiled brief. Performed supine to sit with modA for BLE management and minA for truncal support. Performed stand pivot transfer with minA to w/c. Pt transported to bathroom and performed stand pivot to toilet with use of wall rail and minA (+void/BM). PTA performed peri-care and LB clothing management total A for time management. Performed hand hygiene at time w/c level then pt transported to rehab gym for energy conservation. Performed stand pivot to mat minA and participated in hamstring stretching 1 min x 2 bilaterally. Pt then took seated break with PTA providing cues to maintain erect posture as cardiac PA and Pharmacist spoke with pt. Pt noted to have significant increased posterior lean after 1-2 min requiring tactile cues from PTA for correction. Once completed pt participated in standing balance while reaching and placing horseshoes on mirror for emphasis on anterior lean and maintaining midline. Pt able to place no more than 2 horseshoes before requiring mod multimodal cues for correction for increased wt through forefoot. On second attempt PTA placed red wedge behind heel to facilitate anterior wt shifting. Pt was able to improve anterior shifting but then noted  R knee flexion with heavy lean requiring max cues for correction. Pt performed stand pivot back to w/c with minA and cues for increasing step length as well as to properly align with w/c. Pt transported back to room and remained in w/c with belt alarm placed, call bell within reach and needs met.       Therapy Documentation Precautions:  Precautions Precautions: Fall Precaution Comments: posterior and right lean in standing Restrictions Weight Bearing Restrictions: No General:   Vital Signs: Therapy Vitals Temp: 97.7 F (36.5 C) Pulse Rate: (!) 56 Resp: 18 BP: 134/70 Patient Position (if appropriate): Lying Oxygen Therapy SpO2: 95 % O2 Device: Room Air Pain: Pain Assessment Pain Scale: 0-10 Pain Score: 0-No pain   Therapy/Group: Individual Therapy  Ayza Ripoll  Jaimeson Gopal, PTA  09/05/2019, 3:20 PM

## 2019-09-05 NOTE — Progress Notes (Signed)
Occupational Therapy Session Note  Patient Details  Name: Roger Ramirez MRN: [id number] Date of Birth: [birth year]  Today's Date: 09/05/2019 OT Individual Time: 1110-1206 OT Individual Time Calculation (min): 56 min    Short Term Goals: Week 3:  OT Short Term Goal 1 (Week 3): Continue working on established LTGs set at [facility].  Skilled Therapeutic Interventions/Progress Updates:    Pt worked on cleaning peri area and changing brief sit to stand at the sink.  He needed mod assist for sit to stand from the wheelchair as well as for standing balance when completing peri washing and pulling his brief and pants over his hips.  Increased LOB to the right and posteriorly in standing.  He next washed his hands at the sink from sitting position and therapist took him down to the therapy gym.  Had him work on standing at the high low table while engaged in the PVC pipe puzzle.  He demonstrated frequent posterior LOB and LOB to the right requiring mod to max assist to correct.  He was unable to divide his attention between his balance and the task, so when he would lose his balance, there would be no attempt to correct. He was able to complete the two puzzles from a cognitive standpoint with no more than min instructional cueing and use of the diagram for reference.   Finished session with transfer back to the room with pt sitting up in the wheelchair with call button and phone in reach with safety belt in place.    Therapy Documentation Precautions:  Precautions Precautions: Fall Precaution Comments: posterior and right lean in standing Restrictions Weight Bearing Restrictions: No  Pain: Pain Assessment Pain Scale: Faces Pain Score: 0-No pain ADL: See Care Tool for some details of mobility  Therapy/Group: Individual Therapy  Ival Pacer OTR/L 09/05/2019, 12:33 PM

## 2019-09-05 NOTE — Progress Notes (Signed)
Pt disconnected IV heparin drip for unknown amount of time. Stated he disconnected it, but does not remember how long. Some wet area noted on pt's clothing (possibly from heparin run). Heparin drip resumed at the same rate. Pharmacy made aware, Jesusita Oka PA made aware. No new orders at this time. Continue plan of care.    Marylu Lund, RN

## 2019-09-05 NOTE — Progress Notes (Signed)
Progress Note  Patient Name: Roger Ramirez Date of Encounter: 09/05/2019  Primary Cardiologist: Peter Swaziland, MD   Subjective   Seen while doing PT. No chest pain or dyspnea.   Inpatient Medications    Scheduled Meds:  atorvastatin  40 mg Oral Daily   clopidogrel  75 mg Oral Daily   docusate sodium  100 mg Oral BID   feeding supplement (ENSURE ENLIVE)  237 mL Oral BID BM   insulin aspart  0-15 Units Subcutaneous TID WC   irbesartan  75 mg Oral Daily   levothyroxine  75 mcg Oral Daily   melatonin  3 mg Oral QHS   metoprolol succinate  25 mg Oral Daily   multivitamin with minerals  1 tablet Oral Daily   warfarin  6 mg Oral ONCE-1600   Warfarin - Pharmacist Dosing Inpatient   Does not apply q1600   Continuous Infusions:  sodium chloride Stopped (09/02/19 1536)   heparin 1,150 Units/hr (09/04/19 1952)   PRN Meds: sodium chloride, acetaminophen **OR** acetaminophen, bisacodyl, HYDROcodone-acetaminophen, nitroGLYCERIN, ondansetron **OR** ondansetron (ZOFRAN) IV, polyethylene glycol, sorbitol   Vital Signs    Vitals:   09/03/19 1953 09/04/19 0423 09/04/19 1948 09/05/19 0445  BP: 125/66 (!) 144/67 109/68 103/64  Pulse: 72 71 67 (!) 56  Resp: 17 16 16 16   Temp: 98.2 F (36.8 C) 98.3 F (36.8 C) 98 F (36.7 C) 98.3 F (36.8 C)  TempSrc: Oral     SpO2: 98% 98% 97% 98%  Weight:      Height:        Intake/Output Summary (Last 24 hours) at 09/05/2019 1014 Last data filed at 09/04/2019 1730 Gross per 24 hour  Intake 360 ml  Output --  Net 360 ml   Last 3 Weights 08/25/2019 08/16/2019 08/12/2019  Weight (lbs) 192 lb 10.9 oz 206 lb 6.4 oz 215 lb 2.7 oz  Weight (kg) 87.4 kg 93.622 kg 97.6 kg      Telemetry    Not on tele  ECG    No new tracing   Physical Exam   GEN: No acute distress.   Neck: No JVD Cardiac: RRR, no murmurs, rubs, or gallops.  Respiratory: Clear to auscultation bilaterally. GI: Soft, nontender, non-distended  MS: No edema; No  deformity. Neuro:  Nonfocal  Psych: Normal affect   Labs    High Sensitivity Troponin:   Recent Labs  Lab 09/02/19 1224 09/02/19 1444  TROPONINIHS 4,335* >27,000*      Chemistry Recent Labs  Lab 09/04/19 0724  NA 140  K 4.3  CL 100  CO2 27  GLUCOSE 107*  BUN 11  CREATININE 0.91  CALCIUM 9.6  GFRNONAA >60  GFRAA >60  ANIONGAP 13     Hematology Recent Labs  Lab 09/03/19 0218 09/04/19 0724 09/05/19 0435  WBC 10.4 6.8 8.7  RBC 4.46 4.59 4.58  HGB 13.3 14.0 13.9  HCT 40.4 42.4 42.2  MCV 90.6 92.4 92.1  MCH 29.8 30.5 30.3  MCHC 32.9 33.0 32.9  RDW 13.3 13.6 13.6  PLT 234 226 241    Radiology    ECHOCARDIOGRAM COMPLETE  Result Date: 09/04/2019    ECHOCARDIOGRAM REPORT   Patient Name:   Roger Ramirez Date of Exam: 09/04/2019 Medical Rec #:  09/06/2019     Height:       69.0 in Accession #:    [id number]    Weight:       192.7 lb Date of Birth:  1945/08/23  BSA:          2.034 m Patient Age:    74 years      BP:           144/67 mmHg Patient Gender: M             HR:           71 bpm. Exam Location:  Inpatient Procedure: 2D Echo, Cardiac Doppler and Color Doppler Indications:    Acute MI  History:        Patient has prior history of Echocardiogram examinations, most                 recent 08/10/2019. Risk Factors:Hypertension, Dyslipidemia and                 Diabetes. Brainstem infarct, acute,Cognitive dysfunction,Tobacco                 abuse,Moderate aortic stenosis (From Hx).  Sonographer:    Celesta Gentile RCS Referring Phys: [phone] DAYNA N DUNN IMPRESSIONS  1. Left ventricular ejection fraction, by estimation, is 55 to 60%. The left ventricle has normal function. The left ventricle has no regional wall motion abnormalities. There is mild left ventricular hypertrophy. Left ventricular diastolic parameters are consistent with Grade I diastolic dysfunction (impaired relaxation).  2. Right ventricular systolic function is normal. The right ventricular size is normal.  3. The  mitral valve is normal in structure. No evidence of mitral valve regurgitation.  4. The aortic valve is abnormal. Aortic valve regurgitation is mild. Moderate aortic valve stenosis. FINDINGS  Left Ventricle: Left ventricular ejection fraction, by estimation, is 55 to 60%. The left ventricle has normal function. The left ventricle has no regional wall motion abnormalities. The left ventricular internal cavity size was normal in size. There is  mild left ventricular hypertrophy. Left ventricular diastolic parameters are consistent with Grade I diastolic dysfunction (impaired relaxation). Right Ventricle: The right ventricular size is normal. No increase in right ventricular wall thickness. Right ventricular systolic function is normal. Left Atrium: Left atrial size was normal in size. Right Atrium: Right atrial size was normal in size. Pericardium: There is no evidence of pericardial effusion. Mitral Valve: The mitral valve is normal in structure. No evidence of mitral valve regurgitation. Tricuspid Valve: The tricuspid valve is grossly normal. Tricuspid valve regurgitation is trivial. Aortic Valve: The aortic valve is abnormal. . There is moderate thickening and moderate calcification of the aortic valve. Aortic valve regurgitation is mild. Moderate aortic stenosis is present. Mild aortic valve annular calcification. There is moderate  thickening of the aortic valve. There is moderate calcification of the aortic valve. Aortic valve mean gradient measures 21.3 mmHg. Aortic valve peak gradient measures 35.3 mmHg. Aortic valve area, by VTI measures 0.95 cm. Pulmonic Valve: The pulmonic valve was grossly normal. Pulmonic valve regurgitation is not visualized. Aorta: The aortic root and ascending aorta are structurally normal, with no evidence of dilitation. IAS/Shunts: The atrial septum is grossly normal.  LEFT VENTRICLE PLAX 2D LVIDd:         4.30 cm     Diastology LVIDs:         2.50 cm     LV e' lateral:   6.96 cm/s  LV PW:         1.20 cm     LV E/e' lateral: 6.1 LV IVS:        1.30 cm     LV e' medial:    3.37  cm/s LVOT diam:     2.10 cm     LV E/e' medial:  12.6 LV SV:         62 LV SV Index:   30 LVOT Area:     3.46 cm  LV Volumes (MOD) LV vol d, MOD A2C: 65.7 ml LV vol d, MOD A4C: 66.7 ml LV vol s, MOD A2C: 27.8 ml LV vol s, MOD A4C: 24.7 ml LV SV MOD A2C:     37.9 ml LV SV MOD A4C:     66.7 ml LV SV MOD BP:      41.0 ml RIGHT VENTRICLE RV S prime:     7.62 cm/s TAPSE (M-mode): 1.5 cm LEFT ATRIUM             Index LA diam:        3.00 cm 1.48 cm/m LA Vol (A2C):   38.3 ml 18.83 ml/m LA Vol (A4C):   45.7 ml 22.47 ml/m LA Biplane Vol: 42.5 ml 20.90 ml/m  AORTIC VALVE AV Area (Vmax):    0.97 cm AV Area (Vmean):   0.97 cm AV Area (VTI):     0.95 cm AV Vmax:           297.00 cm/s AV Vmean:          216.667 cm/s AV VTI:            0.650 m AV Peak Grad:      35.3 mmHg AV Mean Grad:      21.3 mmHg LVOT Vmax:         82.90 cm/s LVOT Vmean:        60.900 cm/s LVOT VTI:          0.178 m LVOT/AV VTI ratio: 0.27  AORTA Ao Root diam: 3.40 cm MITRAL VALVE MV Area (PHT): 2.32 cm    SHUNTS MV Decel Time: 326 msec    Systemic VTI:  0.18 m MV E velocity: 42.57 cm/s  Systemic Diam: 2.10 cm MV A velocity: 84.27 cm/s MV E/A ratio:  0.51 Kristeen Miss MD Electronically signed by Kristeen Miss MD Signature Date/Time: 09/04/2019/4:13:42 PM    Final     Cardiac Studies   Echo 09/04/19 1. Left ventricular ejection fraction, by estimation, is 55 to 60%. The  left ventricle has normal function. The left ventricle has no regional  wall motion abnormalities. There is mild left ventricular hypertrophy.  Left ventricular diastolic parameters  are consistent with Grade I diastolic dysfunction (impaired relaxation).  2. Right ventricular systolic function is normal. The right ventricular  size is normal.  3. The mitral valve is normal in structure. No evidence of mitral valve  regurgitation.  4. The aortic valve is abnormal. Aortic  valve regurgitation is mild.  Moderate aortic valve stenosis.   Patient Profile     Harvis Mabus is a 74 y.o. male with a hx of saddle PE from presumed antiphospholipid antibody syndrome on anticoagulation prior to admission, tobacco abuse, HTN, DM, CVA 05/2018 with reported cognitive deficits with recent recurrent CVA 08/10/19, moderate AS by echo 07/2019 who is being seen for the evaluation of myocardial infarction at the request of Dr. Bertram Gala.   Assessment & Plan    1. Recent acute inferior STEMI on 5/21 - Recommended medical therapy due to multiple co morbidities. Now asymptomatic and tolerating PT. Seen personally. - Continue Plavix, statin and BB  2. Recent recurrent CVA (with history of antiphospholipid antibody syndrome and saddle PE)  - Xarelto  transitioned to Coumadin   3. HTN - BP soft - Stop ARB to allow permissive hypertension - Continue BB  4. HLD - Continue statin  - 08/11/2019: Cholesterol 120; HDL 35; LDL Cholesterol 57; Triglycerides 139; VLDL 28  [facility] will sign off.   Medication Recommendations:  Continue Toprol XL 25mg  qd daily, Plavix 75mg  qd, Lipitor 40mg  qd and coumadin per pharmacy  Other recommendations (labs, testing, etc):  Stop Avapro Follow up as an outpatient:  Please arrange follow with Dr. Martinique after discharge   For questions or updates, please contact [facility] Please consult [email or web address] for contact info under        SignedLeanor Kail, PA  09/05/2019, 10:14 AM

## 2019-09-05 NOTE — Progress Notes (Signed)
Speech Language Pathology Daily Session Note  Patient Details  Name: Roger Ramirez MRN: [id number] Date of Birth: 1945/06/07  Today's Date: 09/05/2019 SLP Individual Time: 1300-1355 SLP Individual Time Calculation (min): 55 min  Short Term Goals: Week 3: SLP Short Term Goal 1 (Week 3): Pt will recall 2 safety precaution with Mod A multimodal cues. SLP Short Term Goal 2 (Week 3): Pt will use external aids to recall daily information with Mod A. SLP Short Term Goal 3 (Week 3): Pt will demonstrate use word finding strategies in functional communication exchanges with Mod I. SLP Short Term Goal 4 (Week 3): Pt will demonstate ability to problem solve during basic and familiar tasks with Min A. SLP Short Term Goal 5 (Week 3): Pt will identify 2 cognitive and 2 physical impairment with Mod A multimodal cues.  Skilled Therapeutic Interventions: Pt was seen for skilled ST targeting cognitive-linguistic goals. Pt required only Supervision A for functional problem solving and sequencing during a basic calendar creation task. Increased Mod A verbal and visual required for problem solving during a 3-step action card sequencing task. Pt did demonstrate functional basic problem solving of familiar tasks such as use of remote function and how to untangle his arm from IV line with only Min A verbal cues. He sustained his attention appropriately but required Min A for word finding in functional conversation and awareness of verbal errors made. Pt continues to required Mod-Max A verbal cues to name any physical or cognitive impairment and to verbalize any safety precautions.  Pt left sitting in wheelchair with alarm set and needs within reach, telesitter active. Continue per current plan of care.        Pain Pain Assessment Pain Scale: Faces Pain Score: 0-No pain  Therapy/Group: Individual Therapy  [name] 09/05/2019, 6:52 AM

## 2019-09-05 NOTE — Discharge Instructions (Signed)
Inpatient Rehab Discharge Instructions  Amori Colomb Discharge date and time: No discharge date for patient encounter.   Activities/Precautions/ Functional Status: Activity: activity as tolerated Diet: diabetic diet Wound Care: none needed Functional status:  ___ No restrictions     ___ Walk up steps independently ___ 24/7 supervision/assistance   ___ Walk up steps with assistance ___ Intermittent supervision/assistance  ___ Bathe/dress independently ___ Walk with walker     _x__ Bathe/dress with assistance ___ Walk Independently    ___ Shower independently ___ Walk with assistance    ___ Shower with assistance ___ No alcohol     ___ Return to work/school ________  Special Instructions: No driving smoking or alcohol STROKE/TIA DISCHARGE INSTRUCTIONS SMOKING Cigarette smoking nearly doubles your risk of having a stroke & is the single most alterable risk factor  If you smoke or have smoked in the last 12 months, you are advised to quit smoking for your health.  Most of the excess cardiovascular risk related to smoking disappears within a year of stopping.  Ask you doctor about anti-smoking medications  [place] Quit Line: 1-800-QUIT NOW  Free Smoking Cessation Classes [phone]  CHOLESTEROL Know your levels; limit fat & cholesterol in your diet  Lipid Panel     Component Value Date/Time   CHOL 120 08/11/2019 0404   TRIG 139 08/11/2019 0404   HDL 35 (L) 08/11/2019 0404   CHOLHDL 3.4 08/11/2019 0404   VLDL 28 08/11/2019 0404   LDLCALC 57 08/11/2019 0404      Many patients benefit from treatment even if their cholesterol is at goal.  Goal: Total Cholesterol (CHOL) less than 160  Goal:  Triglycerides (TRIG) less than 150  Goal:  HDL greater than 40  Goal:  LDL (LDLCALC) less than 100   BLOOD PRESSURE American Stroke Association blood pressure target is less that 120/80 mm/Hg  Your discharge blood pressure is:  BP: (!) 156/71  Monitor your blood pressure  Limit your  salt and alcohol intake  Many individuals will require more than one medication for high blood pressure  DIABETES (A1c is a blood sugar average for last 3 months) Goal HGBA1c is under 7% (HBGA1c is blood sugar average for last 3 months)  Diabetes:     Lab Results  Component Value Date   HGBA1C 6.1 (H) 08/10/2019     Your HGBA1c can be lowered with medications, healthy diet, and exercise.  Check your blood sugar as directed by your physician  Call your physician if you experience unexplained or low blood sugars.  PHYSICAL ACTIVITY/REHABILITATION Goal is 30 minutes at least 4 days per week  Activity: Increase activity slowly, Therapies: Physical Therapy: Home Health Return to work:   Activity decreases your risk of heart attack and stroke and makes your heart stronger.  It helps control your weight and blood pressure; helps you relax and can improve your mood.  Participate in a regular exercise program.  Talk with your doctor about the best form of exercise for you (dancing, walking, swimming, cycling).  DIET/WEIGHT Goal is to maintain a healthy weight  Your discharge diet is:  Diet Order            Diet heart healthy/carb modified Room service appropriate? Yes; Fluid consistency: Thin  Diet effective ____              liquids Your height is:  Height: 5\' 9"  (175.3 cm) Your current weight is: Weight: 93.6 kg Your Body Mass Index (BMI) is:  BMI (  Calculated): 30.47  Following the type of diet specifically designed for you will help prevent another stroke.  Your goal weight range is:    Your goal Body Mass Index (BMI) is 19-24.  Healthy food habits can help reduce 3 risk factors for stroke:  High cholesterol, hypertension, and excess weight.  RESOURCES Stroke/Support Group:  Call [phone]   STROKE EDUCATION PROVIDED/REVIEWED AND GIVEN TO PATIENT Stroke warning signs and symptoms How to activate emergency medical system (call 911). Medications prescribed at  discharge. Need for follow-up after discharge. Personal risk factors for stroke. Pneumonia vaccine given:  Flu vaccine given:  My questions have been answered, the writing is legible, and I understand these instructions.  I will adhere to these goals & educational materials that have been provided to me after my discharge from the hospital.      My questions have been answered and I understand these instructions. I will adhere to these goals and the provided educational materials after my discharge from the hospital.  Patient/Caregiver Signature _______________________________ Date __________  Clinician Signature _______________________________________ Date __________  Please bring this form and your medication list with you to all your follow-up doctor's appointments.   =================================================================================================================  Information on my medicine - Coumadin   (Warfarin)  Why was Coumadin prescribed for you? Coumadin was prescribed for you because you have a blood clot or a medical condition that can cause an increased risk of forming blood clots. Blood clots can cause serious health problems by blocking the flow of blood to the heart, lung, or brain. Coumadin can prevent harmful blood clots from forming. As a reminder your indication for Coumadin is:   Stroke Prevention Because Of Atrial Fibrillation  What test will check on my response to Coumadin? While on Coumadin (warfarin) you will need to have an INR test regularly to ensure that your dose is keeping you in the desired range. The INR (international normalized ratio) number is calculated from the result of the laboratory test called prothrombin time (PT).  If an INR APPOINTMENT HAS NOT ALREADY BEEN MADE FOR YOU please schedule an appointment to have this lab work done by your health care provider within 7 days. Your INR goal is usually a number between:  2 to 3 or  your provider may give you a more narrow range like 2-2.5.  Ask your health care provider during an office visit what your goal INR is.  What  do you need to  know  About  COUMADIN? Take Coumadin (warfarin) exactly as prescribed by your healthcare provider about the same time each day.  DO NOT stop taking without talking to the doctor who prescribed the medication.  Stopping without other blood clot prevention medication to take the place of Coumadin may increase your risk of developing a new clot or stroke.  Get refills before you run out.  What do you do if you miss a dose? If you miss a dose, take it as soon as you remember on the same day then continue your regularly scheduled regimen the next day.  Do not take two doses of Coumadin at the same time.  Important Safety Information A possible side effect of Coumadin (Warfarin) is an increased risk of bleeding. You should call your healthcare provider right away if you experience any of the following: ? Bleeding from an injury or your nose that does not stop. ? Unusual colored urine (red or dark brown) or unusual colored stools (red or black). ? Unusual bruising for unknown  reasons. ? A serious fall or if you hit your head (even if there is no bleeding).  Some foods or medicines interact with Coumadin (warfarin) and might alter your response to warfarin. To help avoid this: ? Eat a balanced diet, maintaining a consistent amount of Vitamin K. ? Notify your provider about major diet changes you plan to make. ? Avoid alcohol or limit your intake to 1 drink for women and 2 drinks for men per day. (1 drink is 5 oz. wine, 12 oz. beer, or 1.5 oz. liquor.)  Make sure that ANY health care provider who prescribes medication for you knows that you are taking Coumadin (warfarin).  Also make sure the healthcare provider who is monitoring your Coumadin knows when you have started a new medication including herbals and non-prescription products.  Coumadin  (Warfarin)  Major Drug Interactions  Increased Warfarin Effect Decreased Warfarin Effect  Alcohol (large quantities) Antibiotics (esp. Septra/Bactrim, Flagyl, Cipro) Amiodarone (Cordarone) Aspirin (ASA) Cimetidine (Tagamet) Megestrol (Megace) NSAIDs (ibuprofen, naproxen, etc.) Piroxicam (Feldene) Propafenone (Rythmol SR) Propranolol (Inderal) Isoniazid (INH) Posaconazole (Noxafil) Barbiturates (Phenobarbital) Carbamazepine (Tegretol) Chlordiazepoxide (Librium) Cholestyramine (Questran) Griseofulvin Oral Contraceptives Rifampin Sucralfate (Carafate) Vitamin K   Coumadin (Warfarin) Major Herbal Interactions  Increased Warfarin Effect Decreased Warfarin Effect  Garlic Ginseng Ginkgo biloba Coenzyme Q10 Green tea St. Johns wort    Coumadin (Warfarin) FOOD Interactions  Eat a consistent number of servings per week of foods HIGH in Vitamin K (1 serving =  cup)  Collards (cooked, or boiled & drained) Kale (cooked, or boiled & drained) Mustard greens (cooked, or boiled & drained) Parsley *serving size only =  cup Spinach (cooked, or boiled & drained) Swiss chard (cooked, or boiled & drained) Turnip greens (cooked, or boiled & drained)  Eat a consistent number of servings per week of foods MEDIUM-HIGH in Vitamin K (1 serving = 1 cup)  Asparagus (cooked, or boiled & drained) Broccoli (cooked, boiled & drained, or raw & chopped) Brussel sprouts (cooked, or boiled & drained) *serving size only =  cup Lettuce, raw (green leaf, endive, romaine) Spinach, raw Turnip greens, raw & chopped   These websites have more information on Coumadin (warfarin):  http://www.king-russell.com/; https://www.hines.net/;

## 2019-09-05 NOTE — Progress Notes (Signed)
[place] PHYSICAL MEDICINE & REHABILITATION PROGRESS NOTE   Subjective/Complaints:  Appreciate cardiology and neuro input .  ECHO reviewed pt does not recall having CP  Able to tolerate therapy on reduced schedule      ROS: Pt denies SOB, abd pain, CP, N/V/C/D, no increased weakness       Objective:   ECHOCARDIOGRAM COMPLETE  Result Date: 09/04/2019    ECHOCARDIOGRAM REPORT   Patient Name:   Roger Ramirez Date of Exam: 09/04/2019 Medical Rec #:  [id number]     Height:       69.0 in Accession #:    [id number]    Weight:       192.7 lb Date of Birth:  [birth year]     BSA:          2.034 m Patient Age:    74 years      BP:           144/67 mmHg Patient Gender: M             HR:           71 bpm. Exam Location:  Inpatient Procedure: 2D Echo, Cardiac Doppler and Color Doppler Indications:    Acute MI  History:        Patient has prior history of Echocardiogram examinations, most                 recent 08/10/2019. Risk Factors:Hypertension, Dyslipidemia and                 Diabetes. Brainstem infarct, acute,Cognitive dysfunction,Tobacco                 abuse,Moderate aortic stenosis (From Hx).  Sonographer:    Alvino Chapel RCS Referring Phys: [phone] [place]  1. Left ventricular ejection fraction, by estimation, is 55 to 60%. The left ventricle has normal function. The left ventricle has no regional wall motion abnormalities. There is mild left ventricular hypertrophy. Left ventricular diastolic parameters are consistent with Grade I diastolic dysfunction (impaired relaxation).  2. Right ventricular systolic function is normal. The right ventricular size is normal.  3. The mitral valve is normal in structure. No evidence of mitral valve regurgitation.  4. The aortic valve is abnormal. Aortic valve regurgitation is mild. Moderate aortic valve stenosis. FINDINGS  Left Ventricle: Left ventricular ejection fraction, by estimation, is 55 to 60%. The left ventricle has normal function. The left  ventricle has no regional wall motion abnormalities. The left ventricular internal cavity size was normal in size. There is  mild left ventricular hypertrophy. Left ventricular diastolic parameters are consistent with Grade I diastolic dysfunction (impaired relaxation). Right Ventricle: The right ventricular size is normal. No increase in right ventricular wall thickness. Right ventricular systolic function is normal. Left Atrium: Left atrial size was normal in size. Right Atrium: Right atrial size was normal in size. Pericardium: There is no evidence of pericardial effusion. Mitral Valve: The mitral valve is normal in structure. No evidence of mitral valve regurgitation. Tricuspid Valve: The tricuspid valve is grossly normal. Tricuspid valve regurgitation is trivial. Aortic Valve: The aortic valve is abnormal. . There is moderate thickening and moderate calcification of the aortic valve. Aortic valve regurgitation is mild. Moderate aortic stenosis is present. Mild aortic valve annular calcification. There is moderate  thickening of the aortic valve. There is moderate calcification of the aortic valve. Aortic valve mean gradient measures 21.3 mmHg. Aortic valve peak gradient measures 35.3 mmHg. Aortic valve  area, by VTI measures 0.95 cm. Pulmonic Valve: The pulmonic valve was grossly normal. Pulmonic valve regurgitation is not visualized. Aorta: The aortic root and ascending aorta are structurally normal, with no evidence of dilitation. IAS/Shunts: The atrial septum is grossly normal.  LEFT VENTRICLE PLAX 2D LVIDd:         4.30 cm     Diastology LVIDs:         2.50 cm     LV e' lateral:   6.96 cm/s LV PW:         1.20 cm     LV E/e' lateral: 6.1 LV IVS:        1.30 cm     LV e' medial:    3.37 cm/s LVOT diam:     2.10 cm     LV E/e' medial:  12.6 LV SV:         62 LV SV Index:   30 LVOT Area:     3.46 cm  LV Volumes (MOD) LV vol d, MOD A2C: 65.7 ml LV vol d, MOD A4C: 66.7 ml LV vol s, MOD A2C: 27.8 ml LV vol s,  MOD A4C: 24.7 ml LV SV MOD A2C:     37.9 ml LV SV MOD A4C:     66.7 ml LV SV MOD BP:      41.0 ml RIGHT VENTRICLE RV S prime:     7.62 cm/s TAPSE (M-mode): 1.5 cm LEFT ATRIUM             Index LA diam:        3.00 cm 1.48 cm/m LA Vol (A2C):   38.3 ml 18.83 ml/m LA Vol (A4C):   45.7 ml 22.47 ml/m LA Biplane Vol: 42.5 ml 20.90 ml/m  AORTIC VALVE AV Area (Vmax):    0.97 cm AV Area (Vmean):   0.97 cm AV Area (VTI):     0.95 cm AV Vmax:           297.00 cm/s AV Vmean:          216.667 cm/s AV VTI:            0.650 m AV Peak Grad:      35.3 mmHg AV Mean Grad:      21.3 mmHg LVOT Vmax:         82.90 cm/s LVOT Vmean:        60.900 cm/s LVOT VTI:          0.178 m LVOT/AV VTI ratio: 0.27  AORTA Ao Root diam: 3.40 cm MITRAL VALVE MV Area (PHT): 2.32 cm    SHUNTS MV Decel Time: 326 msec    Systemic VTI:  0.18 m MV E velocity: 42.57 cm/s  Systemic Diam: 2.10 cm MV A velocity: 84.27 cm/s MV E/A ratio:  0.51 Kristeen Miss MD Electronically signed by Kristeen Miss MD Signature Date/Time: 09/04/2019/4:13:42 PM    Final    Recent Labs    09/04/19 0724 09/05/19 0435  WBC 6.8 8.7  HGB 14.0 13.9  HCT 42.4 42.2  PLT 226 241   Recent Labs    09/04/19 0724  NA 140  K 4.3  CL 100  CO2 27  GLUCOSE 107*  BUN 11  CREATININE 0.91  CALCIUM 9.6    Intake/Output Summary (Last 24 hours) at 09/05/2019 0740 Last data filed at 09/04/2019 1730 Gross per 24 hour  Intake 360 ml  Output --  Net 360 ml     Physical Exam: Vital Signs  Blood pressure 103/64, pulse (!) 56, temperature 98.3 F (36.8 C), resp. rate 16, height 5\' 9"  (1.753 m), weight 87.4 kg, SpO2 98 %.   General: No acute distress Mood and affect are appropriate Heart: Regular rate and rhythm no rubs murmurs or extra sounds Lungs: Clear to auscultation, breathing unlabored, no rales or wheezes Abdomen: Positive bowel sounds, soft nontender to palpation, nondistended Extremities: No clubbing, cyanosis, or edema Skin: No evidence of breakdown, no  evidence of rash Neurologic: slow processing oriented to person and place  . Cranial nerves II through XII intact, motor strength is 5/5 in Bilateral  deltoid, bicep, tricep, grip, 5/5 left and 3/5 Right hip flexor, knee extensors, ankle dorsiflexor and plantar flexor Functional mobility: posterior lean in standing Musculoskeletal: Full range of motion in all 4 extremities. No joint swelling  Assessment/Plan: 1. Functional deficits secondary to left ACA infarct which require 3+ hours per day of interdisciplinary therapy in a comprehensive inpatient rehab setting.  Physiatrist is providing close team supervision and 24 hour management of active medical problems listed below.  Physiatrist and rehab team continue to assess barriers to discharge/monitor patient progress toward functional and medical goals  Care Tool:  Bathing  Bathing activity did not occur: Safety/medical concerns Body parts bathed by patient: Right arm, Left arm, Chest, Abdomen, Front perineal area, Right upper leg, Left upper leg, Face   Body parts bathed by helper: Right lower leg, Left lower leg, Buttocks     Bathing assist Assist Level: Moderate Assistance - Patient 50 - 74%     Upper Body Dressing/Undressing Upper body dressing   What is the patient wearing?: Pull over shirt    Upper body assist Assist Level: Supervision/Verbal cueing    Lower Body Dressing/Undressing Lower body dressing      What is the patient wearing?: Incontinence brief, Pants     Lower body assist Assist for lower body dressing: Moderate Assistance - Patient 50 - 74%     Toileting Toileting    Toileting assist Assist for toileting: Moderate Assistance - Patient 50 - 74%     Transfers Chair/bed transfer  Transfers assist  Chair/bed transfer activity did not occur: Safety/medical concerns  Chair/bed transfer assist level: Minimal Assistance - Patient > 75%     Locomotion Ambulation   Ambulation assist   Ambulation  activity did not occur: Safety/medical concerns  Assist level: Moderate Assistance - Patient 50 - 74% Assistive device: Walker-rolling Max distance: 30 ft   Walk 10 feet activity   Assist  Walk 10 feet activity did not occur: Safety/medical concerns  Assist level: Moderate Assistance - Patient - 50 - 74% Assistive device: Walker-rolling   Walk 50 feet activity   Assist Walk 50 feet with 2 turns activity did not occur: Safety/medical concerns  Assist level: Moderate Assistance - Patient - 50 - 74% Assistive device: Walker-rolling    Walk 150 feet activity   Assist Walk 150 feet activity did not occur: Safety/medical concerns         Walk 10 feet on uneven surface  activity   Assist Walk 10 feet on uneven surfaces activity did not occur: Safety/medical concerns         Wheelchair     Assist Will patient use wheelchair at discharge?: (TBD)             Wheelchair 50 feet with 2 turns activity    Assist            Wheelchair 150 feet activity  Assist          Blood pressure 103/64, pulse (!) 56, temperature 98.3 F (36.8 C), resp. rate 16, height 5\' 9"  (1.753 m), weight 87.4 kg, SpO2 98 %.  Medical Problem List and Plan: 1.  Right side weakness with dysarthria secondary to left ACA territory acute infarction as well as history of CVA 2020 with cognitive deficits Cognition and RLE weakness are main issues              -patient may shower   Continue CIR PT, OT, SLP reduced schedule, ? Exercise tolerance post MI- ECHO without sig loss of EF    5/22- Acute inferior MI based on EKG- Troponin 4.335; f/u troponin >27k- no   Team conf in am  2.  Antithrombotics: -DVT/anticoagulation: Heparin Has  antiphospholipid antibody syndrome with history of DVT/PE.  Consultation with neuro as well as cardiology, currently on IV heparin, will ask pharmacy to consult on warfarin dosing with INR goal of 2.0-2.5.  Also will add Plavix 75 mg/day 3.  Pain Management: Tylenol as needed. Well controlled 4. Mood: Melatonin 3 mg nightly             -antipsychotic agents: N/A 5. Neuropsych: This patient is capable of making decisions on his own behalf. Poor concentration attention, initiation , , methylphenidate trial not helpful, have discontinued   6. Skin/Wound Care: Routine skin checks 7. Fluids/Electrolytes/Nutrition: Routine in and outs. CMP ordered. 8.  History of saddle pulmonary emboli.  Continue Eliquis 9.  Diabetes mellitus.  Hemoglobin A1c 6.1.  SSI.  Patient on Glucophage 1000 mg twice daily prior to admission.  Resume as needed CBG (last 3)  Recent Labs    09/04/19 1639 09/04/19 2112 09/05/19 0631  GLUCAP 113* 113* 107*       5/25- BGs controlled- con't meds 10.  Hypertension.  Avapro 75 mg daily.           Vitals:   09/04/19 1948 09/05/19 0445  BP: 109/68 103/64  Pulse: 67 (!) 56  Resp: 16 16  Temp: 98 F (36.7 C) 98.3 F (36.8 C)  SpO2: 97% 98%   5/25- BP and pulse controlled  11.  Hypothyroidism. Continue Synthroid 12.  Hyperlipidemia. Lipitor 13.  History of tobacco abuse.  Counseling  14. Acute inferior MI- CP resolved - on IV heparin, hx APA, Neuro rec warfarin , Plavix added per cardiology     LOS: 20 days A FACE TO FACE EVALUATION WAS PERFORMED  6/25 09/05/2019, 7:40 AM

## 2019-09-05 NOTE — Progress Notes (Signed)
Patient ID: Roger Ramirez, male   DOB: 09-Dec-1945, 74 y.o.   MRN: [id number]  SW followed up with daughter to inform her of the SNF approvals and declines. Daughter asked SW to send of referral. [facility] daughters request.

## 2019-09-06 ENCOUNTER — Inpatient Hospital Stay (HOSPITAL_COMMUNITY): Payer: Medicare Other

## 2019-09-06 ENCOUNTER — Inpatient Hospital Stay (HOSPITAL_COMMUNITY): Payer: Medicare Other | Admitting: Occupational Therapy

## 2019-09-06 ENCOUNTER — Inpatient Hospital Stay (HOSPITAL_COMMUNITY): Payer: Medicare Other | Admitting: Speech Pathology

## 2019-09-06 LAB — CBC
HCT: 41.7 % (ref 39.0–52.0)
Hemoglobin: 13.9 g/dL (ref 13.0–17.0)
MCH: 30.5 pg (ref 26.0–34.0)
MCHC: 33.3 g/dL (ref 30.0–36.0)
MCV: 91.4 fL (ref 80.0–100.0)
Platelets: 238 10*3/uL (ref 150–400)
RBC: 4.56 MIL/uL (ref 4.22–5.81)
RDW: 13.5 % (ref 11.5–15.5)
WBC: 10.1 10*3/uL (ref 4.0–10.5)
nRBC: 0 % (ref 0.0–0.2)

## 2019-09-06 LAB — SARS CORONAVIRUS 2 (TAT 6-24 HRS): SARS Coronavirus 2: NEGATIVE

## 2019-09-06 LAB — GLUCOSE, CAPILLARY
Glucose-Capillary: 113 mg/dL — ABNORMAL HIGH (ref 70–99)
Glucose-Capillary: 169 mg/dL — ABNORMAL HIGH (ref 70–99)
Glucose-Capillary: 117 mg/dL — ABNORMAL HIGH (ref 70–99)
Glucose-Capillary: 131 mg/dL — ABNORMAL HIGH (ref 70–99)

## 2019-09-06 LAB — APTT: aPTT: 97 seconds — ABNORMAL HIGH (ref 24–36)

## 2019-09-06 LAB — PROTIME-INR
INR: 1.1 (ref 0.8–1.2)
Prothrombin Time: 13.9 seconds (ref 11.4–15.2)

## 2019-09-06 LAB — HEPARIN LEVEL (UNFRACTIONATED): Heparin Unfractionated: 0.68 IU/mL (ref 0.30–0.70)

## 2019-09-06 MED ORDER — HYDROCODONE-ACETAMINOPHEN 5-325 MG PO TABS: 1.0000 | ORAL_TABLET | ORAL | 0 refills | Status: DC | PRN

## 2019-09-06 MED ORDER — NITROGLYCERIN 0.4 MG SL SUBL: 0.4000 mg | SUBLINGUAL_TABLET | SUBLINGUAL | 12 refills | Status: DC | PRN

## 2019-09-06 MED ORDER — METOPROLOL SUCCINATE ER 25 MG PO TB24: 25.0000 mg | ORAL_TABLET | Freq: Every day | ORAL | Status: DC

## 2019-09-06 MED ORDER — CLOPIDOGREL BISULFATE 75 MG PO TABS: 75.0000 mg | ORAL_TABLET | Freq: Every day | ORAL | Status: DC

## 2019-09-06 MED ORDER — ADULT MULTIVITAMIN W/MINERALS CH: 1.0000 | ORAL_TABLET | Freq: Every day | ORAL | Status: DC

## 2019-09-06 MED ORDER — DOCUSATE SODIUM 100 MG PO CAPS: 100.0000 mg | ORAL_CAPSULE | Freq: Two times a day (BID) | ORAL | 0 refills | Status: DC

## 2019-09-06 MED ORDER — WARFARIN SODIUM 3 MG PO TABS
6.0000 mg | ORAL_TABLET | Freq: Once | ORAL | Status: AC
  Administered 2019-09-06: 6 mg via ORAL
  Filled 2019-09-06: qty 2

## 2019-09-06 MED ORDER — ACETAMINOPHEN 325 MG PO TABS: 650.0000 mg | ORAL_TABLET | Freq: Four times a day (QID) | ORAL | Status: DC | PRN

## 2019-09-06 MED ORDER — MELATONIN 3 MG PO TABS: 3.0000 mg | ORAL_TABLET | Freq: Every day | ORAL | 0 refills | Status: DC

## 2019-09-06 NOTE — Progress Notes (Signed)
[place] PHYSICAL MEDICINE & REHABILITATION PROGRESS NOTE   Subjective/Complaints:  Working on cognition with SLP, no c/os Discussed anticoag plan with pt     ROS: Pt denies SOB, abd pain, CP, N/V/C/D, no increased weakness       Objective:   ECHOCARDIOGRAM COMPLETE  Result Date: 09/04/2019    ECHOCARDIOGRAM REPORT   Patient Name:   Roger Ramirez Date of Exam: 09/04/2019 Medical Rec #:  [id number]     Height:       69.0 in Accession #:    [id number]    Weight:       192.7 lb Date of Birth:  1946-03-19     BSA:          2.034 m Patient Age:    74 years      BP:           144/67 mmHg Patient Gender: M             HR:           71 bpm. Exam Location:  Inpatient Procedure: 2D Echo, Cardiac Doppler and Color Doppler Indications:    Acute MI  History:        Patient has prior history of Echocardiogram examinations, most                 recent 08/10/2019. Risk Factors:Hypertension, Dyslipidemia and                 Diabetes. Brainstem infarct, acute,Cognitive dysfunction,Tobacco                 abuse,Moderate aortic stenosis (From Hx).  Sonographer:    Alvino Chapel RCS Referring Phys: [phone] [place]  1. Left ventricular ejection fraction, by estimation, is 55 to 60%. The left ventricle has normal function. The left ventricle has no regional wall motion abnormalities. There is mild left ventricular hypertrophy. Left ventricular diastolic parameters are consistent with Grade I diastolic dysfunction (impaired relaxation).  2. Right ventricular systolic function is normal. The right ventricular size is normal.  3. The mitral valve is normal in structure. No evidence of mitral valve regurgitation.  4. The aortic valve is abnormal. Aortic valve regurgitation is mild. Moderate aortic valve stenosis. FINDINGS  Left Ventricle: Left ventricular ejection fraction, by estimation, is 55 to 60%. The left ventricle has normal function. The left ventricle has no regional wall motion abnormalities. The  left ventricular internal cavity size was normal in size. There is  mild left ventricular hypertrophy. Left ventricular diastolic parameters are consistent with Grade I diastolic dysfunction (impaired relaxation). Right Ventricle: The right ventricular size is normal. No increase in right ventricular wall thickness. Right ventricular systolic function is normal. Left Atrium: Left atrial size was normal in size. Right Atrium: Right atrial size was normal in size. Pericardium: There is no evidence of pericardial effusion. Mitral Valve: The mitral valve is normal in structure. No evidence of mitral valve regurgitation. Tricuspid Valve: The tricuspid valve is grossly normal. Tricuspid valve regurgitation is trivial. Aortic Valve: The aortic valve is abnormal. . There is moderate thickening and moderate calcification of the aortic valve. Aortic valve regurgitation is mild. Moderate aortic stenosis is present. Mild aortic valve annular calcification. There is moderate  thickening of the aortic valve. There is moderate calcification of the aortic valve. Aortic valve mean gradient measures 21.3 mmHg. Aortic valve peak gradient measures 35.3 mmHg. Aortic valve area, by VTI measures 0.95 cm. Pulmonic Valve: The pulmonic valve was  grossly normal. Pulmonic valve regurgitation is not visualized. Aorta: The aortic root and ascending aorta are structurally normal, with no evidence of dilitation. IAS/Shunts: The atrial septum is grossly normal.  LEFT VENTRICLE PLAX 2D LVIDd:         4.30 cm     Diastology LVIDs:         2.50 cm     LV e' lateral:   6.96 cm/s LV PW:         1.20 cm     LV E/e' lateral: 6.1 LV IVS:        1.30 cm     LV e' medial:    3.37 cm/s LVOT diam:     2.10 cm     LV E/e' medial:  12.6 LV SV:         62 LV SV Index:   30 LVOT Area:     3.46 cm  LV Volumes (MOD) LV vol d, MOD A2C: 65.7 ml LV vol d, MOD A4C: 66.7 ml LV vol s, MOD A2C: 27.8 ml LV vol s, MOD A4C: 24.7 ml LV SV MOD A2C:     37.9 ml LV SV MOD A4C:      66.7 ml LV SV MOD BP:      41.0 ml RIGHT VENTRICLE RV S prime:     7.62 cm/s TAPSE (M-mode): 1.5 cm LEFT ATRIUM             Index LA diam:        3.00 cm 1.48 cm/m LA Vol (A2C):   38.3 ml 18.83 ml/m LA Vol (A4C):   45.7 ml 22.47 ml/m LA Biplane Vol: 42.5 ml 20.90 ml/m  AORTIC VALVE AV Area (Vmax):    0.97 cm AV Area (Vmean):   0.97 cm AV Area (VTI):     0.95 cm AV Vmax:           297.00 cm/s AV Vmean:          216.667 cm/s AV VTI:            0.650 m AV Peak Grad:      35.3 mmHg AV Mean Grad:      21.3 mmHg LVOT Vmax:         82.90 cm/s LVOT Vmean:        60.900 cm/s LVOT VTI:          0.178 m LVOT/AV VTI ratio: 0.27  AORTA Ao Root diam: 3.40 cm MITRAL VALVE MV Area (PHT): 2.32 cm    SHUNTS MV Decel Time: 326 msec    Systemic VTI:  0.18 m MV E velocity: 42.57 cm/s  Systemic Diam: 2.10 cm MV A velocity: 84.27 cm/s MV E/A ratio:  0.51 Mertie Moores MD Electronically signed by Mertie Moores MD Signature Date/Time: 09/04/2019/4:13:42 PM    Final    Recent Labs    09/05/19 0435 09/06/19 0028  WBC 8.7 10.1  HGB 13.9 13.9  HCT 42.2 41.7  PLT 241 238   Recent Labs    09/04/19 0724  NA 140  K 4.3  CL 100  CO2 27  GLUCOSE 107*  BUN 11  CREATININE 0.91  CALCIUM 9.6    Intake/Output Summary (Last 24 hours) at 09/06/2019 0853 Last data filed at 09/06/2019 0824 Gross per 24 hour  Intake 680 ml  Output 325 ml  Net 355 ml     Physical Exam: Vital Signs Blood pressure 130/67, pulse 66, temperature 98 F (36.7 C), temperature  source Oral, resp. rate 18, height _0  (1.753 m), weight 87.4 kg, SpO2 98 %.    General: No acute distress Mood and affect are appropriate Heart: Regular rate and rhythm no rubs murmurs or extra sounds Lungs: Clear to auscultation, breathing unlabored, no rales or wheezes Abdomen: Positive bowel sounds, soft nontender to palpation, nondistended Extremities: No clubbing, cyanosis, or edema Skin: No evidence of breakdown, no evidence of rash  . Cranial nerves  II through XII intact, motor strength is 5/5 in Bilateral  deltoid, bicep, tricep, grip, 5/5 left and 3/5 Right hip flexor, knee extensors, ankle dorsiflexor and plantar flexor Functional mobility: posterior lean in standing Musculoskeletal: Full range of motion in all 4 extremities. No joint swelling  Assessment/Plan: 1. Functional deficits secondary to left ACA infarct which require 3+ hours per day of interdisciplinary therapy in a comprehensive inpatient rehab setting.  Physiatrist is providing close team supervision and 24 hour management of active medical problems listed below.  Physiatrist and rehab team continue to assess barriers to discharge/monitor patient progress toward functional and medical goals  Care Tool:  Bathing  Bathing activity did not occur: Safety/medical concerns Body parts bathed by patient: Right arm, Left arm, Chest, Abdomen, Front perineal area, Right upper leg, Left upper leg, Face   Body parts bathed by helper: Front perineal area, Buttocks Body parts n/a: Right arm, Left arm, Chest, Abdomen, Right upper leg, Left lower leg, Right lower leg, Left upper leg, Face(not attempted this session)   Bathing assist Assist Level: Moderate Assistance - Patient 50 - 74%     Upper Body Dressing/Undressing Upper body dressing   What is the patient wearing?: Pull over shirt    Upper body assist Assist Level: Supervision/Verbal cueing    Lower Body Dressing/Undressing Lower body dressing      What is the patient wearing?: Incontinence brief, Pants     Lower body assist Assist for lower body dressing: Moderate Assistance - Patient 50 - 74%     Toileting Toileting    Toileting assist Assist for toileting: Moderate Assistance - Patient 50 - 74%     Transfers Chair/bed transfer  Transfers assist  Chair/bed transfer activity did not occur: Safety/medical concerns  Chair/bed transfer assist level: Minimal Assistance - Patient > 75%      Locomotion Ambulation   Ambulation assist   Ambulation activity did not occur: Safety/medical concerns  Assist level: Moderate Assistance - Patient 50 - 74% Assistive device: Walker-rolling Max distance: 30 ft   Walk 10 feet activity   Assist  Walk 10 feet activity did not occur: Safety/medical concerns  Assist level: Moderate Assistance - Patient - 50 - 74% Assistive device: Walker-rolling   Walk 50 feet activity   Assist Walk 50 feet with 2 turns activity did not occur: Safety/medical concerns  Assist level: Moderate Assistance - Patient - 50 - 74% Assistive device: Walker-rolling    Walk 150 feet activity   Assist Walk 150 feet activity did not occur: Safety/medical concerns         Walk 10 feet on uneven surface  activity   Assist Walk 10 feet on uneven surfaces activity did not occur: Safety/medical concerns         Wheelchair     Assist Will patient use wheelchair at discharge?: (TBD)             Wheelchair 50 feet with 2 turns activity    Assist  Wheelchair 150 feet activity     Assist          Blood pressure 130/67, pulse 66, temperature 98 F (36.7 C), temperature source Oral, resp. rate 18, height _0  (1.753 m), weight 87.4 kg, SpO2 98 %.  Medical Problem List and Plan: 1.  Right side weakness with dysarthria secondary to left ACA territory acute infarction as well as history of CVA 2020 with cognitive deficits Cognition and RLE weakness are main issues              -patient may shower Ready for D/C once off heparin drip   Continue CIR PT, OT, SLP reduced schedule, ? Exercise tolerance post MI- ECHO without sig loss of EF    5/22- Acute inferior MI based on EKG- Troponin 4.335; f/u troponin >27k- no   Team conference today please see physician documentation under team conference tab, met with team  to discuss problems,progress, and goals. Formulized individual treatment plan based on medical history,  underlying problem and comorbidities. 2.  Antithrombotics: -DVT/anticoagulation: Heparin Has  antiphospholipid antibody syndrome with history of DVT/PE.  Consultation with neuro as well as cardiology, currently on IV heparin, will ask pharmacy to consult on warfarin dosing with INR goal of 2.0-2.5. addedPlavix 75 mg/day 3. Pain Management: Tylenol as needed. Well controlled 4. Mood: Melatonin 3 mg nightly             -antipsychotic agents: N/A 5. Neuropsych: This patient is capable of making decisions on his own behalf. Poor concentration attention, initiation , , methylphenidate trial not helpful, have discontinued   6. Skin/Wound Care: Routine skin checks 7. Fluids/Electrolytes/Nutrition: Routine in and outs. CMP ordered. 8.  History of saddle pulmonary emboli.  Continue Eliquis 9.  Diabetes mellitus.  Hemoglobin A1c 6.1.  SSI.  Patient on Glucophage 1000 mg twice daily prior to admission.  Resume as needed CBG (last 3)  Recent Labs    09/05/19 1732 09/05/19 2106 09/06/19 0624  GLUCAP 103* 120* 113*       5/26- BGs controlled- con't meds 10.  Hypertension.  Avapro 75 mg daily.           Vitals:   09/06/19 0327 09/06/19 0815  BP: 113/60 130/67  Pulse: (!) 51 66  Resp: 18   Temp: 98 F (36.7 C)   SpO2: 97% 98%   5/26- BP and pulse controlled  11.  Hypothyroidism. Continue Synthroid 12.  Hyperlipidemia. Lipitor 13.  History of tobacco abuse.  Counseling  14. Acute inferior MI- CP resolved - on IV heparin, hx APA, Neuro rec warfarin , Plavix added per cardiology  15.  Daily cbc for now given multiple anticoagulants in transition  Hgb stable   LOS: 21 days A FACE TO Hunker E Kirsteins 09/06/2019, 8:53 AM

## 2019-09-06 NOTE — Discharge Summary (Signed)
Physician Discharge Summary  Patient ID: Roger Ramirez MRN: [id number] DOB/AGE: 76-Aug-1947 76 y.o.  Admit date: 08/16/2019 Discharge date: 09/08/2019  Discharge Diagnoses:  Principal Problem:   Brainstem infarct, acute [facility]) Active Problems:   Acute myocardial infarction (HCC) Pain management Hypertension Diabetes mellitus History of saddle pulmonary emboli Hypothyroidism Hyperlipidemia History of tobacco abuse Acute inferior myocardial infarction  Discharged Condition: Stable  Significant Diagnostic Studies: CT ANGIO HEAD W OR WO CONTRAST  Result Date: 08/10/2019 CLINICAL DATA:  Code stroke follow-up, right facial droop and leg weakness EXAM: CT ANGIOGRAPHY HEAD AND NECK TECHNIQUE: Multidetector CT imaging of the head and neck was performed using the standard protocol during bolus administration of intravenous contrast. Multiplanar CT image reconstructions and MIPs were obtained to evaluate the vascular anatomy. Carotid stenosis measurements (when applicable) are obtained utilizing NASCET criteria, using the distal internal carotid diameter as the denominator. CONTRAST:  37mL OMNIPAQUE IOHEXOL 350 MG/ML SOLN COMPARISON:  None. FINDINGS: CTA NECK Aortic arch: Calcified plaque along the arch and patent great vessel origins. Common origin of the innominate and left common carotid arteries. There is irregular noncalcified plaque along the left subclavian origin with mild stenosis. Right carotid system: Patent. Primarily calcified plaque along the proximal ICA causing less than 50% stenosis. Left carotid system: Patent. Atherosclerotic wall thickening along the common carotid. Primarily calcified plaque along the proximal ICA causing less than 50% stenosis. Vertebral arteries: Patent. Skeleton: Degenerative changes of the cervical spine. There is partial fusion of the C4 and C5 vertebral bodies. Other neck: No mass or adenopathy. Upper chest: No apical lung mass. Review of the MIP images confirms  the above findings CTA HEAD Anterior circulation: Intracranial internal carotid arteries are patent with calcified plaque causing mild to moderate stenosis. Middle cerebral arteries are patent with mild atherosclerotic irregularity more distally. Right anterior cerebral artery is patent. The proximal left anterior cerebral artery is patent. There is atherosclerotic irregularity of the left A3 ACA with high-grade stenosis and probable subsequent occlusion. Posterior circulation: Intracranial vertebral arteries, basilar artery, and posterior cerebral arteries are patent. Small posterior communicating arteries are present. There is short segment high-grade stenosis of the proximal left P2 PCA. Venous sinuses: As permitted by contrast timing, patent. Review of the MIP images confirms the above findings IMPRESSION: Atherosclerotic irregularity of left A3 ACA with high-grade stenosis and suspected subsequent occlusion. Short segment high-grade stenosis proximal left P2 PCA. Calcified plaque at the ICA origins causing less than 50% stenosis. Initial is results were discussed by telephone at the time of interpretation on 08/10/2019 at 9:54 am to provider Dr. Laurence Slate, who verbally acknowledged these results. Electronically Signed   By: Guadlupe Spanish M.D.   On: 08/10/2019 10:07   CT ANGIO NECK W OR WO CONTRAST  Result Date: 08/10/2019 CLINICAL DATA:  Code stroke follow-up, right facial droop and leg weakness EXAM: CT ANGIOGRAPHY HEAD AND NECK TECHNIQUE: Multidetector CT imaging of the head and neck was performed using the standard protocol during bolus administration of intravenous contrast. Multiplanar CT image reconstructions and MIPs were obtained to evaluate the vascular anatomy. Carotid stenosis measurements (when applicable) are obtained utilizing NASCET criteria, using the distal internal carotid diameter as the denominator. CONTRAST:  63mL OMNIPAQUE IOHEXOL 350 MG/ML SOLN COMPARISON:  None. FINDINGS: CTA NECK Aortic  arch: Calcified plaque along the arch and patent great vessel origins. Common origin of the innominate and left common carotid arteries. There is irregular noncalcified plaque along the left subclavian origin with mild stenosis. Right carotid system: Patent.  Primarily calcified plaque along the proximal ICA causing less than 50% stenosis. Left carotid system: Patent. Atherosclerotic wall thickening along the common carotid. Primarily calcified plaque along the proximal ICA causing less than 50% stenosis. Vertebral arteries: Patent. Skeleton: Degenerative changes of the cervical spine. There is partial fusion of the C4 and C5 vertebral bodies. Other neck: No mass or adenopathy. Upper chest: No apical lung mass. Review of the MIP images confirms the above findings CTA HEAD Anterior circulation: Intracranial internal carotid arteries are patent with calcified plaque causing mild to moderate stenosis. Middle cerebral arteries are patent with mild atherosclerotic irregularity more distally. Right anterior cerebral artery is patent. The proximal left anterior cerebral artery is patent. There is atherosclerotic irregularity of the left A3 ACA with high-grade stenosis and probable subsequent occlusion. Posterior circulation: Intracranial vertebral arteries, basilar artery, and posterior cerebral arteries are patent. Small posterior communicating arteries are present. There is short segment high-grade stenosis of the proximal left P2 PCA. Venous sinuses: As permitted by contrast timing, patent. Review of the MIP images confirms the above findings IMPRESSION: Atherosclerotic irregularity of left A3 ACA with high-grade stenosis and suspected subsequent occlusion. Short segment high-grade stenosis proximal left P2 PCA. Calcified plaque at the ICA origins causing less than 50% stenosis. Initial is results were discussed by telephone at the time of interpretation on 08/10/2019 at 9:54 am to provider Dr. Laurence Slate, who verbally  acknowledged these results. Electronically Signed   By: Guadlupe Spanish M.D.   On: 08/10/2019 10:07   MR BRAIN WO CONTRAST  Result Date: 08/15/2019 CLINICAL DATA:  Stroke with worsening symptoms.  Recent stroke. EXAM: MRI HEAD WITHOUT CONTRAST TECHNIQUE: Multiplanar, multiecho pulse sequences of the brain and surrounding structures were obtained without intravenous contrast. COMPARISON:  MRI 08/10/2019. FINDINGS: Brain: Interval extension of left anterior cerebral artery territory infarct. Large area of infarct now in the body of the corpus callosum on the left. There is also infarct in the left posterior frontal medial lobe compatible with distal left MCA infarct. Note is made of severe stenosis left A3 segment on CTA. Generalized atrophy which is severe in the temporal lobes. Ventricular enlargement consistent with volume loss in the brain. Negative for hemorrhage or mass lesion. Vascular: Normal arterial flow voids Skull and upper cervical spine: No focal skeletal lesion. Sinuses/Orbits: Paranasal sinuses clear.  No orbital lesion Other: None IMPRESSION: Left anterior cerebral artery territory infarct has progressed since the recent MRI of 08/10/2019. No associated hemorrhage Generalized atrophy which is severe in the temporal lobes. Chronic microvascular ischemic change in the white matter. Electronically Signed   By: Marlan Palau M.D.   On: 08/15/2019 18:37   MR BRAIN WO CONTRAST  Result Date: 08/10/2019 CLINICAL DATA:  Right facial droop, slurred speech and confusion today. Negative acute CT evaluation. EXAM: MRI HEAD WITHOUT CONTRAST TECHNIQUE: Multiplanar, multiecho pulse sequences of the brain and surrounding structures were obtained without intravenous contrast. COMPARISON:  CT studies earlier same day.  MRI 06/06/2018. FINDINGS: Brain: Brain atrophy with some temporal lobe predominance. Diffusion imaging shows acute infarction in the left body of the corpus callosum. No other acute infarction.  Elsewhere, there are moderate chronic small-vessel ischemic changes of the hemispheric white matter. No mass lesion, hemorrhage, hydrocephalus or extra-axial collection. Vascular: Major vessels at the base of the brain show flow. Skull and upper cervical spine: Negative Sinuses/Orbits: Clear/normal Other: None IMPRESSION: Acute infarction affecting the left body of the corpus callosum. Brain atrophy with temporal lobe predominance. Chronic small-vessel ischemic changes  affecting the cerebral hemispheric white matter. Electronically Signed   By: Nelson Chimes M.D.   On: 08/10/2019 10:57   ECHOCARDIOGRAM COMPLETE  Result Date: 09/04/2019    ECHOCARDIOGRAM REPORT   Patient Name:   Roger Ramirez Date of Exam: 09/04/2019 Medical Rec #:  [id number]     Height:       69.0 in Accession #:    [id number]    Weight:       192.7 lb Date of Birth:  1945/08/30     BSA:          2.034 m Patient Age:    61 years      BP:           144/67 mmHg Patient Gender: M             HR:           71 bpm. Exam Location:  Inpatient Procedure: 2D Echo, Cardiac Doppler and Color Doppler Indications:    Acute MI  History:        Patient has prior history of Echocardiogram examinations, most                 recent 08/10/2019. Risk Factors:Hypertension, Dyslipidemia and                 Diabetes. Brainstem infarct, acute,Cognitive dysfunction,Tobacco                 abuse,Moderate aortic stenosis (From Hx).  Sonographer:    Alvino Chapel RCS Referring Phys: [id number] [place]  1. Left ventricular ejection fraction, by estimation, is 55 to 60%. The left ventricle has normal function. The left ventricle has no regional wall motion abnormalities. There is mild left ventricular hypertrophy. Left ventricular diastolic parameters are consistent with Grade I diastolic dysfunction (impaired relaxation).  2. Right ventricular systolic function is normal. The right ventricular size is normal.  3. The mitral valve is normal in structure. No evidence  of mitral valve regurgitation.  4. The aortic valve is abnormal. Aortic valve regurgitation is mild. Moderate aortic valve stenosis. FINDINGS  Left Ventricle: Left ventricular ejection fraction, by estimation, is 55 to 60%. The left ventricle has normal function. The left ventricle has no regional wall motion abnormalities. The left ventricular internal cavity size was normal in size. There is  mild left ventricular hypertrophy. Left ventricular diastolic parameters are consistent with Grade I diastolic dysfunction (impaired relaxation). Right Ventricle: The right ventricular size is normal. No increase in right ventricular wall thickness. Right ventricular systolic function is normal. Left Atrium: Left atrial size was normal in size. Right Atrium: Right atrial size was normal in size. Pericardium: There is no evidence of pericardial effusion. Mitral Valve: The mitral valve is normal in structure. No evidence of mitral valve regurgitation. Tricuspid Valve: The tricuspid valve is grossly normal. Tricuspid valve regurgitation is trivial. Aortic Valve: The aortic valve is abnormal. . There is moderate thickening and moderate calcification of the aortic valve. Aortic valve regurgitation is mild. Moderate aortic stenosis is present. Mild aortic valve annular calcification. There is moderate  thickening of the aortic valve. There is moderate calcification of the aortic valve. Aortic valve mean gradient measures 21.3 mmHg. Aortic valve peak gradient measures 35.3 mmHg. Aortic valve area, by VTI measures 0.95 cm. Pulmonic Valve: The pulmonic valve was grossly normal. Pulmonic valve regurgitation is not visualized. Aorta: The aortic root and ascending aorta are structurally normal, with no evidence of dilitation. IAS/Shunts: The atrial  septum is grossly normal.  LEFT VENTRICLE PLAX 2D LVIDd:         4.30 cm     Diastology LVIDs:         2.50 cm     LV e' lateral:   6.96 cm/s LV PW:         1.20 cm     LV E/e' lateral: 6.1  LV IVS:        1.30 cm     LV e' medial:    3.37 cm/s LVOT diam:     2.10 cm     LV E/e' medial:  12.6 LV SV:         62 LV SV Index:   30 LVOT Area:     3.46 cm  LV Volumes (MOD) LV vol d, MOD A2C: 65.7 ml LV vol d, MOD A4C: 66.7 ml LV vol s, MOD A2C: 27.8 ml LV vol s, MOD A4C: 24.7 ml LV SV MOD A2C:     37.9 ml LV SV MOD A4C:     66.7 ml LV SV MOD BP:      41.0 ml RIGHT VENTRICLE RV S prime:     7.62 cm/s TAPSE (M-mode): 1.5 cm LEFT ATRIUM             Index LA diam:        3.00 cm 1.48 cm/m LA Vol (A2C):   38.3 ml 18.83 ml/m LA Vol (A4C):   45.7 ml 22.47 ml/m LA Biplane Vol: 42.5 ml 20.90 ml/m  AORTIC VALVE AV Area (Vmax):    0.97 cm AV Area (Vmean):   0.97 cm AV Area (VTI):     0.95 cm AV Vmax:           297.00 cm/s AV Vmean:          216.667 cm/s AV VTI:            0.650 m AV Peak Grad:      35.3 mmHg AV Mean Grad:      21.3 mmHg LVOT Vmax:         82.90 cm/s LVOT Vmean:        60.900 cm/s LVOT VTI:          0.178 m LVOT/AV VTI ratio: 0.27  AORTA Ao Root diam: 3.40 cm MITRAL VALVE MV Area (PHT): 2.32 cm    SHUNTS MV Decel Time: 326 msec    Systemic VTI:  0.18 m MV E velocity: 42.57 cm/s  Systemic Diam: 2.10 cm MV A velocity: 84.27 cm/s MV E/A ratio:  0.51 Kristeen Miss MD Electronically signed by Kristeen Miss MD Signature Date/Time: 09/04/2019/4:13:42 PM    Final    ECHOCARDIOGRAM COMPLETE  Result Date: 08/10/2019    ECHOCARDIOGRAM REPORT   Patient Name:   Roger Ramirez Date of Exam: 08/10/2019 Medical Rec #:  [id number]     Height:       69.0 in Accession #:    [id number]    Weight:       214.9 lb Date of Birth:  December 05, 1945     BSA:          2.130 m Patient Age:    74 years      BP:           189/93 mmHg Patient Gender: M             HR:           66 bpm. Exam Location:  Inpatient Procedure: 2D Echo, Cardiac Doppler and Color Doppler Indications:    Stroke 434.91 / I163.9  History:        Patient has prior history of Echocardiogram examinations, most                 recent 06/06/2018. Risk  Factors:Hypertension and Diabetes.                 Pulmonary Embolism.  Sonographer:    Elmarie Shiley Dance Referring Phys: [id number] JENNIFER YATES IMPRESSIONS  1. Left ventricular ejection fraction, by estimation, is 60 to 65%. The left ventricle has normal function. The left ventricle has no regional wall motion abnormalities. Left ventricular diastolic parameters are consistent with Grade I diastolic dysfunction (impaired relaxation). Elevated left ventricular end-diastolic pressure.  2. Right ventricular systolic function is normal. The right ventricular size is normal.  3. The mitral valve is normal in structure. Trivial mitral valve regurgitation. No evidence of mitral stenosis.  4. The aortic valve was not well visualized. Aortic valve regurgitation is not visualized. Moderate aortic valve stenosis. Aortic valve mean gradient measures 23.7 mmHg. Aortic valve Vmax measures 3.19 m/s.  5. The inferior vena cava is normal in size with greater than 50% respiratory variability, suggesting right atrial pressure of 3 mmHg. FINDINGS  Left Ventricle: Left ventricular ejection fraction, by estimation, is 60 to 65%. The left ventricle has normal function. The left ventricle has no regional wall motion abnormalities. The left ventricular internal cavity size was normal in size. There is  no left ventricular hypertrophy. Left ventricular diastolic parameters are consistent with Grade I diastolic dysfunction (impaired relaxation). Elevated left ventricular end-diastolic pressure. Right Ventricle: The right ventricular size is normal. No increase in right ventricular wall thickness. Right ventricular systolic function is normal. Left Atrium: Left atrial size was normal in size. Right Atrium: Right atrial size was normal in size. Pericardium: There is no evidence of pericardial effusion. Mitral Valve: The mitral valve is normal in structure. Normal mobility of the mitral valve leaflets. Trivial mitral valve regurgitation. No evidence  of mitral valve stenosis. Tricuspid Valve: The tricuspid valve is normal in structure. Tricuspid valve regurgitation is not demonstrated. No evidence of tricuspid stenosis. Aortic Valve: The aortic valve was not well visualized. . There is moderate thickening and moderate calcification of the aortic valve. Aortic valve regurgitation is not visualized. Moderate aortic stenosis is present. There is moderate thickening of the aortic valve. There is moderate calcification of the aortic valve. Aortic valve mean gradient measures 23.7 mmHg. Aortic valve peak gradient measures 40.6 mmHg. Aortic valve area, by VTI measures 0.50 cm. Pulmonic Valve: The pulmonic valve was normal in structure. Pulmonic valve regurgitation is not visualized. No evidence of pulmonic stenosis. Aorta: The aortic root is normal in size and structure. Venous: The inferior vena cava is normal in size with greater than 50% respiratory variability, suggesting right atrial pressure of 3 mmHg. IAS/Shunts: No atrial level shunt detected by color flow Doppler.  LEFT VENTRICLE PLAX 2D LVIDd:         4.98 cm  Diastology LVIDs:         3.81 cm  LV e' lateral:   10.80 cm/s LV PW:         1.08 cm  LV E/e' lateral: 5.2 LV IVS:        0.94 cm  LV e' medial:    5.44 cm/s LVOT diam:     1.50 cm  LV E/e' medial:  10.3 LV SV:  37 LV SV Index:   17 LVOT Area:     1.77 cm  RIGHT VENTRICLE            IVC RV Basal diam:  2.13 cm    IVC diam: 1.76 cm RV S prime:     9.25 cm/s TAPSE (M-mode): 1.7 cm LEFT ATRIUM             Index       RIGHT ATRIUM           Index LA diam:        3.10 cm 1.46 cm/m  RA Area:     10.10 cm LA Vol (A2C):   43.7 ml 20.51 ml/m RA Volume:   16.60 ml  7.79 ml/m LA Vol (A4C):   25.4 ml 11.92 ml/m LA Biplane Vol: 34.5 ml 16.20 ml/m  AORTIC VALVE AV Area (Vmax):    0.49 cm AV Area (Vmean):   0.42 cm AV Area (VTI):     0.50 cm AV Vmax:           318.67 cm/s AV Vmean:          227.667 cm/s AV VTI:            0.736 m AV Peak Grad:       40.6 mmHg AV Mean Grad:      23.7 mmHg LVOT Vmax:         88.70 cm/s LVOT Vmean:        54.700 cm/s LVOT VTI:          0.207 m LVOT/AV VTI ratio: 0.28  AORTA Ao Root diam: 3.40 cm MITRAL VALVE MV Area (PHT): 3.12 cm     SHUNTS MV Decel Time: 243 msec     Systemic VTI:  0.21 m MV E velocity: 55.80 cm/s   Systemic Diam: 1.50 cm MV A velocity: 112.00 cm/s MV E/A ratio:  0.50 Chilton Si MD Electronically signed by Chilton Si MD Signature Date/Time: 08/10/2019/6:25:01 PM    Final    CT HEAD CODE STROKE WO CONTRAST  Result Date: 08/10/2019 CLINICAL DATA:  Code stroke. Right-sided facial droop and right leg weakness EXAM: CT HEAD WITHOUT CONTRAST TECHNIQUE: Contiguous axial images were obtained from the base of the skull through the vertex without intravenous contrast. COMPARISON:  06/06/2018 brain MRI FINDINGS: Brain: No evidence of acute infarction, hemorrhage, hydrocephalus, extra-axial collection or mass lesion/mass effect. Atrophy that is severe in the temporal lobes. Chronic small vessel ischemia with confluent low-density in the deep cerebral white matter and remote lacunar infarct at the right caudate head. Vascular: Atherosclerotic calcification.  No hyperdense vessel Skull: Normal. Negative for fracture or focal lesion. Sinuses/Orbits: Negative Other: These results were communicated to Dr. Laurence Slate at 9:34 amon 4/29/2021by text page via the [facility] messaging system. ASPECTS [facility] Stroke Program Early CT Score) - Ganglionic level infarction (caudate, lentiform nuclei, internal capsule, insula, M1-M3 cortex): 7 - Supraganglionic infarction (M4-M6 cortex): 3 Total score (0-10 with 10 being normal): 10 IMPRESSION: 1. No acute finding. 2. Severe temporal lobe atrophy. 3. Chronic small vessel ischemia. Electronically Signed   By: Marnee Spring M.D.   On: 08/10/2019 09:35    Labs:  Basic Metabolic Panel: Recent Labs  Lab 09/04/19 0724  NA 140  K 4.3  CL 100  CO2 27  GLUCOSE 107*  BUN 11   CREATININE 0.91  CALCIUM 9.6    CBC: Recent Labs  Lab 09/04/19 0724 09/05/19 0435 09/06/19 0028  WBC 6.8  8.7 10.1  HGB 14.0 13.9 13.9  HCT 42.4 42.2 41.7  MCV 92.4 92.1 91.4  PLT 226 241 238    CBG: Recent Labs  Lab 09/05/19 2106 09/06/19 0624 09/06/19 1127 09/06/19 1640 09/06/19 2055  GLUCAP 120* 113* 169* 117* 131*   Family history.  Mother and father with hypertension.  Denies any diabetes mellitus colon cancer or rectal cancer  Brief HPI:   Roger Ramirez is a 74 y.o. right-handed male with history of saddle pulmonary emboli from presumed antiphospholipid antibody syndrome maintained on Xarelto, tobacco abuse, hypertension, diabetes mellitus, CVA 2020 with reported cognitive deficits.  Patient lives alone reportedly independent prior to admission.  He has a son and daughter who check on him routinely.  Presented 08/10/2019 with right hemiparesis dysarthria and altered mental status.  Cranial CT scan unremarkable for acute changes.  MRI showed acute infarction affecting the left body of the corpus callosum.  CT angiogram of the head and neck with atherosclerotic irregularity of the left A3 ACA high-grade stenosis with suspected subsequent occlusion.  Echocardiogram with ejection fraction 65%.  Patient did not receive TPA.  Follow-up MRI showed progression left anterior cerebral artery infarction since the MRI of 08/10/2019 no associated hemorrhage.  Neurology follow-up initially on Eliquis for CVA prophylaxis.  Patient was admitted for a comprehensive rehab program.   Hospital Course: Roger Ramirez was admitted to rehab 08/16/2019 for inpatient therapies to consist of PT, ST and OT at least three hours five days a week. Past admission physiatrist, therapy team and rehab RN have worked together to provide customized collaborative inpatient rehab.  Pertaining to patient's left ACA infarction as well as history of CVA 2020 noted cognitive deficits followed by neurology services.  Latest  cranial CT scan 09/10/2019 showed no acute hemorrhage with noted recent left ACA infarction stable atrophy with temporal predominance.  Hospital course complicated by acute inferior myocardial infarction troponin elevated 4.35 elevating to greater than 27 cardiology services consulted with noted history of antiphospholipid antibody syndrome and history of pulmonary emboli he was placed on intravenous heparin as well as Coumadin with continued Plavix therapy as directed.  No bleeding episodes noted.  His blood pressures overall stayed controlled maintained on Toprol.  He did have a history of diabetes mellitus hemoglobin A1c of 6.1 blood sugars overall controlled.  Lipitor ongoing for hyperlipidemia.  Hormone supplement for hypothyroidism.  He did have a history of tobacco abuse receiving counsel regards to cessation of nicotine products.   Blood pressures were monitored on TID basis and controlled  Diabetes has been monitored with ac/hs CBG checks and SSI was use prn for tighter BS control.   He/ has made gains during rehab stay and is attending therapies  He/ will continue to receive follow up therapies   after discharge  Rehab course: During patient's stay in rehab weekly team conferences were held to monitor patient's progress, set goals and discuss barriers to discharge. At admission, patient required min mod assist 60 feet rolling walker, max assist sit to stand.  Max assist upper body bathing max assist lower body bathing mod assist upper body dressing max is lower body dressing  Physical exam.  Blood pressure 120/67 pulse 78 temperature 97.9 respirations 20 oxygen saturation 96% room air Constitutional well-developed well-nourished HEENT Head.  Normocephalic and atraumatic Eyes.  Pupils round and reactive to light no discharge.nystagmus Neck.  Supple nontender no JVD without thyromegaly Cardiac regular rate rhythm without any extra sounds or murmur heard Respiratory effort normal no  respiratory distress without wheeze Abdomen.  Soft nontender positive bowel sounds without rebound Neurological.  He was alert dysarthric some decrease in attention and limited awareness and follow commands.  Provides his name and place. Left lower extremity 4+/5 proximal to distal Right upper extremity 3+/5 proximal distal Right lower extremity hip flexion knee extension 2/5, ankle dorsiflexion 3/5 with apraxia  He/  has had improvement in activity tolerance, balance, postural control as well as ability to compensate for deficits. He/ has had improvement in functional use RUE/LUE  and RLE/LLE as well as improvement in awareness.  Perform supine to sit moderate assist bilateral lower extremity management.  Performed stand pivot transfers with minimal assist.  Patient transported to the bathroom perform stand pivot to the toilet with use of wall rail and minimal assistance.  Perform pericare lower body clothing management total assist for time management.  Perform hand hygiene at the sink.  Patient noted to have significant increased posterior lean.  He needed moderate assist for sit to stand for ADLs from the wheelchair some increased loss of balance.  Demonstrated frequent posterior loss of balance as noted.  Patient required supervision assist for functional problem solving and sequencing during a basic calendar creation task.  Increase to moderate assist verbal and visual required for problem solving during the three-step action card sequencing task.  He was able to sustain attention appropriate but required minimal assist for word finding and functional conversation and awareness of verbal errors made.  Due to limited gains and limited social support was felt skilled nursing facility was needed with bed becoming available 09/08/2019.       Disposition: Discharged to skilled nursing facility    Diet: Carb modified  Special Instructions: No smoking or alcohol  Patient will need weekly drawls  INR to monitor Coumadin therapy  Labs 09/11/2019.  Prothrombin time 23 INR 2.1  Medications at discharge 1.  Tylenol as needed 2.  Lipitor 40 mg p.o. daily 3.  Plavix 75 mg p.o. daily 4.  Colace 100 mg p.o. twice daily 5.  Hydrocodone 1-2 tabs every 4 hours as needed pain 6.  Synthroid 75 mcg p.o. daily 7.  Melatonin 3 mg nightly 8.  Toprol-XL 25 mg p.o. daily 9.  Multivitamin daily 10.  Nitroglycerin as needed chest pain 11.  Coumadin with latest Coumadin dose 4 mg daily adjusted accordingly for INR 2-2 0.5 12.Vitamin B 12 100 mcg daily    30-35 minutes were spent completing discharge summary and discharge planning  Discharge Instructions    Ambulatory referral to Neurology   Complete by: As directed    An appointment is requested in approximately 4 weeks left ACA territory infarction   Ambulatory referral to Physical Medicine Rehab   Complete by: As directed    Follow up one month SNF left ACA infarction       Contact information for follow-up providers    Kirsteins, Victorino Sparrow, MD Follow up.   Specialty: Physical Medicine and Rehabilitation Why: Office to call for appointment Contact information: [address] [place] [address] [phone]        [place], Peter M, MD Follow up.   Specialty: Cardiology Why: Call for appointment Contact information: [address] [place] [address] [phone]            Contact information for after-discharge care    Destination    [facility]erred [facility] .   Service: Skilled Nursing Contact information: [address] [place] [address] [phone]                  Signed: Mcarthur Rossetti Teaghan Melrose 09/07/2019, 4:39 AM

## 2019-09-06 NOTE — Care Management (Signed)
Patient ID: Roger Ramirez, male   DOB: [birth year], 74 y.o.   MRN: [id number] Met with patient's nurse to review nursing concerns. Noted incontinence improved with timed toileting. Still impulsive and attempting to get up without assistance; currently using telesitter. Plan to discharge to SNF at end of the week; goal to transition away from telesitter tomorrow morning and use other means for distraction and maintenance of safety including busy apron, hand games and fidget toys; patient likes to play cards or keep at nursing station between therapy sessions.

## 2019-09-06 NOTE — Progress Notes (Signed)
Occupational Therapy Session Note  Patient Details  Name: Roger Ramirez MRN: [id number] Date of Birth: 10/24/1945  Today's Date: 09/06/2019 OT Individual Time: 1350-1444 OT Individual Time Calculation (min): 54 min    Short Term Goals: Week 3:  OT Short Term Goal 1 (Week 3): Continue working on established LTGs set at [facility].  Skilled Therapeutic Interventions/Progress Updates:    Pt worked on ADL retraining sit to stand at the sink this session.  Mod assist for transfer from supine to sit EOB to start session with mod assist to transfer to the wheelchair with use of the RW for support.  He completed UB bathing and dressing with supervision and then needed mod assist for LB bathing and dressing sit to stand secondary to LOB posteriorly and to the right.  He needed mod questioning cueing to sequence through task secondary to decreased cognitive processing.  He completed session with donning of the gripper socks with min guard assist secondary to right lean in sitting when reaching down toward the right foot.  He transferred to the bed with mod assist and max instructional cueing for use of the walker and for reaching back to the bed.  Pt left with call button and phone in reach with safety alarm in place.    Therapy Documentation Precautions:  Precautions Precautions: Fall Precaution Comments: posterior and right lean in standing Restrictions Weight Bearing Restrictions: No  Pain: Pain Assessment Pain Scale: Faces Pain Score: 0-No pain ADL: See Care tool Section for some details of mobility and selfcare  Therapy/Group: Individual Therapy  Icy Fuhrmann OTR/L 09/06/2019, 2:46 PM

## 2019-09-06 NOTE — Patient Care Conference (Signed)
Inpatient RehabilitationTeam Conference and Plan of Care Update Date: 09/06/2019   Time: 2:30 PM    Patient Name: Roger Ramirez      Medical Record Number: [id number]  Date of Birth: 1945/05/30 Sex: Male         Room/Bed: 4W21C/4W21C-01 Payor Info: Payor: [employer] MEDICARE / Plan: [facility] MEDICARE / Product Type: *No Product type* /    Admit Date/Time:  08/16/2019  3:38 PM  Primary Diagnosis:  Brainstem infarct, acute [facility])  Patient Active Problem List   Diagnosis Date Noted  . Acute myocardial infarction (HCC)   . Brainstem infarct, acute (HCC) 08/18/2019  . Resolved ischemic cerebrovascular accident (CVA) involving left anterior cerebral artery territory in remote past 08/16/2019  . Benign essential HTN   . Acute ischemic stroke (HCC)   . History of CVA (cerebrovascular accident)   . History of pulmonary embolism   . Tobacco abuse   . Essential hypertension   . Obesity (BMI 30.0-34.9) 08/10/2019  . Cerebral infarction (HCC)   . CVA (cerebral vascular accident) (HCC) 06/06/2018  . Antiphospholipid syndrome (HCC) 06/06/2018  . Diabetes mellitus type 2, noninsulin dependent (HCC) 06/06/2018  . Hypercholesterolemia with hypertriglyceridemia 01/23/2016  . Impaired fasting glucose 01/23/2016  . B12 deficiency 01/23/2016  . Hypothyroid 01/23/2016  . Cognitive dysfunction 01/23/2016  . Pleuritic chest pain   . Leg DVT (deep venous thromboembolism), acute (HCC)   . Near syncope   . Elevated BP   . History of pulmonary embolus (PE)   . Pulmonary embolism (HCC) 12/24/2014    Expected Discharge Date: Expected Discharge Date: (SNF pending)  Team Members Present: Physician leading conference: Dr. Claudette Laws Care Coodinator Present: Cheyenne Adas, RN, BSN, CRRN;Christina Vita Barley, BSW Nurse Present: Otilio Carpen, RN PT Present: Woodfin Ganja, PT OT Present: Perrin Maltese, OT SLP Present: Suzzette Righter, CF-SLP PPS Coordinator present : Edson Snowball, PT     Current  Status/Progress Goal Weekly Team Focus  Bowel/Bladder   Patient is Incontinent x2. LBM 5/24  timed toileting while awake. Q2 checks at bedtime  Assess toileting needs Qshift and PRN   Swallow/Nutrition/ Hydration   Supervision due to cognition - sustained attention to intake improving slightly     work toward decreasing supervision needs   ADL's   Supervision for UB selfcare with mod assist for LB selfcare sit to stand.  Mod assist for functional transfers to the walk-in shower and for toilet transfers.  Still with increased LOB posteriorly and to the right in standing.  Decreased memory from session to session also noted.  min assist overall  selfcare retraining, balance retraining, transfer training, DME education, cognitive retraining, DME education, pt/family education   Mobility   min assist bed mobility, min-mod assist sit<>stands with RW, min-mod assist for gait 30-40 ft, continues with posterior lean and poor attention/initiation  supervision bed mobility, CGA transfers, min assist ambulation and stairs  attention, initiation, activity tolerance, balance, transfers, gait   Communication   Supervision-Min A  Supervision  carryover word finding in conversation, expressing wants/needs, verbal initiaiton   Safety/Cognition/ Behavioral Observations  Min-Mod A depending on complexity of task  Min-Mod A  recall with aids, familiar basic problem soving, intellectual awareness, sustained attention   Pain   Denies pain  To remain pain free.  assess pain qshift and PRN   Skin   MASD to groin.  skin protectant with brief changes.  To keep patient dry and free of further breakdown  assess skin qshift and PRN  Rehab Goals Patient on target to meet rehab goals: Yes Rehab Goals Revised: on target with physical goals *See Care Plan and progress notes for long and short-term goals.     Barriers to Discharge  Current Status/Progress Possible Resolutions Date Resolved   Nursing                   PT  Inaccessible home environment;Home environment access/layout;Decreased caregiver support;Lack of/limited family support                 OT                  SLP                Care Coordinator Medical stability              Discharge Planning/Teaching Needs:  Goal to discharge to SNF      Team Discussion:  Progress limited by fatigue and medical issues. Cardiology consult for MI; continue heparin drip and coumadin. Continent with timed toileting with only ocassional incontinence noted. Continue to work on decreased memory and poor carryover; requires max cues for sequencing.  Revisions to Treatment Plan:  Goals downgraded due to limited functional progression. Discharge plan to SNF pending medical clearance.    Medical Summary Current Status: No improvement on Ritalin this has been discontinued, patient had MI 5 days ago, neurology and cardiology assessed patient Weekly Focus/Goal: Monitor exercise tolerance status post MI, transition from IV heparin to warfarin  Barriers to Discharge: Medical stability;Other (comments)  Barriers to Discharge Comments: On IV heparin Possible Resolutions to Barriers: Transition to warfarin per pharmacy protocol   Continued Need for Acute Rehabilitation Level of Care: The patient requires daily medical management by a physician with specialized training in physical medicine and rehabilitation for the following reasons: Direction of a multidisciplinary physical rehabilitation program to maximize functional independence : Yes Medical management of patient stability for increased activity during participation in an intensive rehabilitation regime.: Yes Analysis of laboratory values and/or radiology reports with any subsequent need for medication adjustment and/or medical intervention. : Yes   I attest that I was present, lead the team conference, and concur with the assessment and plan of the team.   Dorien Chihuahua B 09/06/2019, 2:30 PM

## 2019-09-06 NOTE — Progress Notes (Signed)
Speech Language Pathology Daily Session Note  Patient Details  Name: Roger Ramirez MRN: [id number] Date of Birth: 02-22-1946  Today's Date: 09/06/2019 SLP Individual Time: 0830-0928 SLP Individual Time Calculation (min): 58 min  Short Term Goals: Week 3: SLP Short Term Goal 1 (Week 3): Pt will recall 2 safety precaution with Mod A multimodal cues. SLP Short Term Goal 2 (Week 3): Pt will use external aids to recall daily information with Mod A. SLP Short Term Goal 3 (Week 3): Pt will demonstrate use word finding strategies in functional communication exchanges with Mod I. SLP Short Term Goal 4 (Week 3): Pt will demonstate ability to problem solve during basic and familiar tasks with Min A. SLP Short Term Goal 5 (Week 3): Pt will identify 2 cognitive and 2 physical impairment with Mod A multimodal cues.  Skilled Therapeutic Interventions: Pt was seen for skilled ST targeting cognition. Pt used calendar to orient to date with Min A, and with slight modifications to schedule, able to recall appointment times and disciplines for the day with Min A verbal and visual cues. Pt required Max A faded to Mod A for problem solving and recall during a familiar mildly complex card task. Visual aid aided in recall throughout task. Pt verbally sequenced steps of a stand/pivot transfer and safety precautions throughout these transfers with Mod A verbal cueing. Pt left laying in bed with alarm set and needs within reach, call bell in hand. Continue per current plan of care.      Pain Pain Assessment Pain Scale: 0-10 Pain Score: 0-No pain  Therapy/Group: Individual Therapy  Roger Ramirez 09/06/2019, 6:58 AM

## 2019-09-06 NOTE — Progress Notes (Signed)
Pt did fairly well without telesitter throughout shift. Seen 2-3 time attempting to get out of bed and wheelchair, but able to redirect. Pt currently in bed resting with all needs within reach.   Marylu Lund, RN

## 2019-09-06 NOTE — Progress Notes (Signed)
ANTICOAGULATION CONSULT NOTE - Follow Up Consult  Pharmacy Consult for Heparin + Coumadin Indication: APLS/PE/hx CVA w/ new CVA and CP  No Known Allergies  Patient Measurements: Height: 5\' 9"  (175.3 cm) Weight: 87.4 kg (192 lb 10.9 oz) IBW/kg (Calculated) : 70.7 Heparin Dosing Weight: 87.4 kg  Vital Signs: Temp: 98.7 F (37.1 C) (05/25 2000) BP: 100/66 (05/25 2000) Pulse Rate: 67 (05/25 2000)  Labs: Recent Labs    09/04/19 0724 09/04/19 0724 09/05/19 0435 09/06/19 0028  HGB 14.0   < > 13.9 13.9  HCT 42.4  --  42.2 41.7  PLT 226  --  241 238  APTT 76*  --  69* 97*  LABPROT  --   --  13.0 13.9  INR  --   --  1.0 1.1  HEPARINUNFRC 0.76*  --  0.80* 0.68  CREATININE 0.91  --   --   --    < > = values in this interval not displayed.    Estimated Creatinine Clearance: 75.6 mL/min (by C-G formula based on SCr of 0.91 mg/dL).   Assessment: Anticoag: Xarelto PTA for hx APLS/PE/hx CVA + new CVA  - Neuro wanted Pradaxa but insurance would not cover. - Started Eliquis 5/5-5/22. Edu done w/ dtr before CIR AVS in. D/c ASA. - 5/22: hold apix and start IV heparin for CP - 5/24: Start Coumadin. INR goal 2-2.5  Heparin level therapeutic (0.68) and PTT (97 sec) also therapeutic and now correlating.  Goal of Therapy:  Heparin level 0.3-0.7 units/ml Monitor platelets by anticoagulation protocol: Yes  INR 2-2.5   Plan:  - Continue heparin at 1150 units/hour - D/c PTT. Monitor heparin levels.   6/24, PharmD, BCPS Please see amion for complete clinical pharmacist phone list 09/06/2019,1:24 AM

## 2019-09-06 NOTE — Progress Notes (Signed)
telesitter discontinued at 0820. Pt calm and resting in bed at this time. Will continue to monitor throughout shift.   Marylu Lund, RN

## 2019-09-06 NOTE — Progress Notes (Signed)
Pt was found messing with the IV heparin.Pt was educated on the importance of not messing with the IV. IV site was wrapped with the gauze.

## 2019-09-06 NOTE — Progress Notes (Signed)
Team Conference Report to Patient/Family  Team Conference discussion was reviewed with the patient and caregiver, including goals, any changes in plan of care and target discharge date.  Patient and caregiver express understanding and are in agreement.  The patient has a target discharge date of (SNF pending).  [name] 09/06/2019, 3:12 PM Patient ID: Roger Ramirez, male   DOB: 23-Aug-1945, 74 y.o.   MRN: [id number]

## 2019-09-06 NOTE — Progress Notes (Signed)
ANTICOAGULATION CONSULT NOTE - Follow Up Consult  Pharmacy Consult for Coumadin Indication: APLS/PE/hx CVA + new CVA  No Known Allergies  Patient Measurements: Height: 5\' 9"  (175.3 cm) Weight: 87.4 kg (192 lb 10.9 oz) IBW/kg (Calculated) : 70.7 Heparin Dosing Weight:   Vital Signs: Temp: 98 F (36.7 C) (05/26 0327) Temp Source: Oral (05/26 0327) BP: 113/60 (05/26 0327) Pulse Rate: 51 (05/26 0327)  Labs: Recent Labs    09/04/19 0724 09/04/19 0724 09/05/19 0435 09/06/19 0028  HGB 14.0   < > 13.9 13.9  HCT 42.4  --  42.2 41.7  PLT 226  --  241 238  APTT 76*  --  69* 97*  LABPROT  --   --  13.0 13.9  INR  --   --  1.0 1.1  HEPARINUNFRC 0.76*  --  0.80* 0.68  CREATININE 0.91  --   --   --    < > = values in this interval not displayed.    Estimated Creatinine Clearance: 75.6 mL/min (by C-G formula based on SCr of 0.91 mg/dL).  Assessment: Anticoag: Xarelto PTA for hx APLS/PE/hx CVA + new CVA.- INR goal 2-2.5 - Neuro wanted Pradaxa but insurance would not cover. - Started Eliquis 5/5-5/22. Edu done w/ dtr before CIR AVS in. D/c ASA. - 5/22: hold apix and start IV heparin for CP - 5/24: Start Coumadin.  - 5/26: HL 0.68 in goal. APTT 97 in goal. Levels correlating.CBC remains WNL.INR 1.1 after 2 doses.  Goal of Therapy:  INR 2-2.5 Monitor platelets by anticoagulation protocol: Yes   Plan:  - Hep at 1150 units/hour until INR>=2.  - Daily HL, CBC, INR - Start Coumadin 6mg  po x1 again today.   Roger Reine S. 08-23-1986, PharmD, BCPS Clinical Staff Pharmacist [email or web address] , Merilynn Finland 09/06/2019,7:21 AM

## 2019-09-06 NOTE — Progress Notes (Signed)
Physical Therapy Session Note  Patient Details  Name: Roger Ramirez MRN: [id number] Date of Birth: 08-Nov-1945  Today's Date: 09/06/2019 PT Individual Time: [phone] and [phone] PT Individual Time Calculation (min): 43 min and 30 min    Short Term Goals: Week 3:  PT Short Term Goal 1 (Week 3): = to LTGs based on ELOS  Skilled Therapeutic Interventions/Progress Updates:   Session 1:  Pt supine in bed upon PT arrival, agreeable to therapy tx and denies pain. Pt transferred to sitting EOB with mod assist, initial posterior lean with cues to correct. Pt noted to be incontinent of bladder. Ambulated from bed>bathroom with RW and min assist x 10 ft and second person for IV pole management. Doffed soiled brief while pt standing, pt seated on toilet unable to further void. Pt performed sit<>stand from commode with RW and min assist, maintained standing balance min assist while therapist donned clean brief. Ambulated from bathroom>bed x 10 ft with min-mod assist and RW, cues for sequencing, and RW management. Pt demonstrating increased R lean in sitting and standing with fatigue. Sitting EOB while therapist looped legs through pants, sit<>stand to pull pants over hips, in standing pt with R lean. Stand pivot to w/c with mod assist secondary to patients R lean, using RW for UE support. Pt transported to the gym in w/c. Pt ambulated 2 x 20 ft with RW and min assist, cues for upright posture, step length and RW management - working on gait and endurance. Transported back to room and left in w/c with needs in reach and chair alarm set.   Session 2: Pt supine in bed upon PT arrival, agreeable to therapy tx and denies pain. Pt transferred to sitting EOB with mod assist, initial posterior lean with cues to correct. Pt noted to be incontinent of bladder. Ambulated from bed>bathroom with RW and min assist x 10 ft and second person for IV pole management. Pt continent of bladder, total assist for clothing  management. Ambulated x 10 ft out of bathroom with min-mod assist and RW. Pt's daughter present this session to observe how patient is moving. Pt ambulated 2 x 57 ft this session with use of RW and min assist, cues for R foot clearance/increased R step length, cues for RW management and cues for upright posture, with fatigue increased R lean and shuffling noted. Pt left in w/c at end of session with needs in reach and chair alarm set, daughter present.    Therapy Documentation Precautions:  Precautions Precautions: Fall Precaution Comments: posterior and right lean in standing Restrictions Weight Bearing Restrictions: No    Therapy/Group: Individual Therapy  Cresenciano Genre, PT, DPT, CSRS 09/06/2019, 7:48 AM

## 2019-09-07 ENCOUNTER — Inpatient Hospital Stay (HOSPITAL_COMMUNITY): Payer: Medicare Other | Admitting: Occupational Therapy

## 2019-09-07 ENCOUNTER — Inpatient Hospital Stay (HOSPITAL_COMMUNITY): Payer: Medicare Other | Admitting: Physical Therapy

## 2019-09-07 ENCOUNTER — Inpatient Hospital Stay (HOSPITAL_COMMUNITY): Payer: Medicare Other | Admitting: Speech Pathology

## 2019-09-07 LAB — CBC
HCT: 42.7 % (ref 39.0–52.0)
Hemoglobin: 14 g/dL (ref 13.0–17.0)
MCH: 30.1 pg (ref 26.0–34.0)
MCHC: 32.8 g/dL (ref 30.0–36.0)
MCV: 91.8 fL (ref 80.0–100.0)
Platelets: 220 10*3/uL (ref 150–400)
RBC: 4.65 MIL/uL (ref 4.22–5.81)
RDW: 13.6 % (ref 11.5–15.5)
WBC: 8 10*3/uL (ref 4.0–10.5)
nRBC: 0 % (ref 0.0–0.2)

## 2019-09-07 LAB — GLUCOSE, CAPILLARY
Glucose-Capillary: 97 mg/dL (ref 70–99)
Glucose-Capillary: 184 mg/dL — ABNORMAL HIGH (ref 70–99)
Glucose-Capillary: 129 mg/dL — ABNORMAL HIGH (ref 70–99)
Glucose-Capillary: 132 mg/dL — ABNORMAL HIGH (ref 70–99)

## 2019-09-07 LAB — PROTIME-INR
INR: 1.4 — ABNORMAL HIGH (ref 0.8–1.2)
Prothrombin Time: 16.2 seconds — ABNORMAL HIGH (ref 11.4–15.2)

## 2019-09-07 LAB — HEPARIN LEVEL (UNFRACTIONATED): Heparin Unfractionated: 0.71 IU/mL — ABNORMAL HIGH (ref 0.30–0.70)

## 2019-09-07 MED ORDER — WARFARIN SODIUM 4 MG PO TABS
4.0000 mg | ORAL_TABLET | Freq: Once | ORAL | Status: AC
  Administered 2019-09-07: 4 mg via ORAL
  Filled 2019-09-07: qty 1

## 2019-09-07 NOTE — Progress Notes (Signed)
[place] PHYSICAL MEDICINE & REHABILITATION PROGRESS NOTE   Subjective/Complaints:  Was fidgeting ith IV last noc, pt asleep but awakens to physical stim     ROS: Pt denies SOB, abd pain, CP, N/V/C/D,       Objective:   No results found. Recent Labs    09/06/19 0028 09/07/19 0548  WBC 10.1 8.0  HGB 13.9 14.0  HCT 41.7 42.7  PLT 238 220   No results for input(s): NA, K, CL, CO2, GLUCOSE, BUN, CREATININE, CALCIUM in the last 72 hours.  Intake/Output Summary (Last 24 hours) at 09/07/2019 0741 Last data filed at 09/07/2019 0328 Gross per 24 hour  Intake 1932.13 ml  Output 50 ml  Net 1882.13 ml     Physical Exam: Vital Signs Blood pressure (!) 155/68, pulse 65, temperature 98.5 F (36.9 C), temperature source Oral, resp. rate 16, height 5\' 9"  (1.753 m), weight 87.4 kg, SpO2 98 %.    General: No acute distress Mood and affect are appropriate Heart: Regular rate and rhythm no rubs murmurs or extra sounds Lungs: Clear to auscultation, breathing unlabored, no rales or wheezes Abdomen: Positive bowel sounds, soft nontender to palpation, nondistended Extremities: No clubbing, cyanosis, or edema  . Cranial nerves II through XII intact, motor strength is 5/5 in Bilateral  deltoid, bicep, tricep, grip, 5/5 left and 4/5 Right hip flexor, knee extensors, ankle dorsiflexor and plantar flexor Functional mobility: posterior lean in standing Musculoskeletal: Full range of motion in all 4 extremities. No joint swelling  Assessment/Plan: 1. Functional deficits secondary to left ACA infarct which require 3+ hours per day of interdisciplinary therapy in a comprehensive inpatient rehab setting.  Physiatrist is providing close team supervision and 24 hour management of active medical problems listed below.  Physiatrist and rehab team continue to assess barriers to discharge/monitor patient progress toward functional and medical goals  Care Tool:  Bathing  Bathing activity did  not occur: Safety/medical concerns Body parts bathed by patient: Right arm, Left arm, Chest, Abdomen, Front perineal area, Right upper leg, Left upper leg, Face, Right lower leg, Left lower leg   Body parts bathed by helper: Buttocks Body parts n/a: Right arm, Left arm, Chest, Abdomen, Right upper leg, Left lower leg, Right lower leg, Left upper leg, Face(not attempted this session)   Bathing assist Assist Level: Moderate Assistance - Patient 50 - 74%     Upper Body Dressing/Undressing Upper body dressing   What is the patient wearing?: Pull over shirt    Upper body assist Assist Level: Supervision/Verbal cueing    Lower Body Dressing/Undressing Lower body dressing      What is the patient wearing?: Incontinence brief, Pants     Lower body assist Assist for lower body dressing: Moderate Assistance - Patient 50 - 74%     Toileting Toileting    Toileting assist Assist for toileting: Moderate Assistance - Patient 50 - 74%     Transfers Chair/bed transfer  Transfers assist  Chair/bed transfer activity did not occur: Safety/medical concerns  Chair/bed transfer assist level: Moderate Assistance - Patient 50 - 74%     Locomotion Ambulation   Ambulation assist   Ambulation activity did not occur: Safety/medical concerns  Assist level: Minimal Assistance - Patient > 75% Assistive device: Walker-rolling Max distance: 20 ft   Walk 10 feet activity   Assist  Walk 10 feet activity did not occur: Safety/medical concerns  Assist level: Minimal Assistance - Patient > 75% Assistive device: Walker-rolling   Walk 50 feet  activity   Assist Walk 50 feet with 2 turns activity did not occur: Safety/medical concerns  Assist level: Moderate Assistance - Patient - 50 - 74% Assistive device: Walker-rolling    Walk 150 feet activity   Assist Walk 150 feet activity did not occur: Safety/medical concerns         Walk 10 feet on uneven surface  activity   Assist  Walk 10 feet on uneven surfaces activity did not occur: Safety/medical concerns         Wheelchair     Assist Will patient use wheelchair at discharge?: (TBD)             Wheelchair 50 feet with 2 turns activity    Assist            Wheelchair 150 feet activity     Assist          Blood pressure (!) 155/68, pulse 65, temperature 98.5 F (36.9 C), temperature source Oral, resp. rate 16, height 5\' 9"  (1.753 m), weight 87.4 kg, SpO2 98 %.  Medical Problem List and Plan: 1.  Right side weakness with dysarthria secondary to left ACA territory acute infarction as well as history of CVA 2020 with cognitive deficits Cognition and RLE weakness are main issues              -patient may shower Ready for D/C once off heparin drip   Continue CIR PT, OT, SLP reduced schedule, ? Exercise tolerance post MI- ECHO without sig loss of EF    5/22- Acute inferior MI based on EKG- Troponin 4.335; f/u troponin >27k- no    2.  Antithrombotics: -DVT/anticoagulation: Heparin Has  antiphospholipid antibody syndrome with history of DVT/PE.  Consultation with neuro as well as cardiology, currently on IV heparin, will ask pharmacy to consult on warfarin dosing with INR goal of 2.0-2.5. added Plavix 75 mg/day INR 1.4 , hep level therapeutic.  Likely will not reach goal of INR 2.0 tomorrow 3. Pain Management: Tylenol as needed. Well controlled 4. Mood: Melatonin 3 mg nightly             -antipsychotic agents: N/A 5. Neuropsych: This patient is capable of making decisions on his own behalf. Poor concentration attention, initiation , , methylphenidate trial not helpful, have discontinued   6. Skin/Wound Care: Routine skin checks 7. Fluids/Electrolytes/Nutrition: Routine in and outs. CMP ordered. 8.  History of saddle pulmonary emboli.  Continue Eliquis 9.  Diabetes mellitus.  Hemoglobin A1c 6.1.  SSI.  Patient on Glucophage 1000 mg twice daily prior to admission.  Resume as needed CBG  (last 3)  Recent Labs    09/06/19 1640 09/06/19 2055 09/07/19 0552  GLUCAP 117* 131* 97       5/27- BGs controlled- con't meds 10.  Hypertension.  Avapro 75 mg daily.           Vitals:   09/06/19 1945 09/07/19 0602  BP: (!) 119/57 (!) 155/68  Pulse: 61 65  Resp: 18 16  Temp: 97.8 F (36.6 C) 98.5 F (36.9 C)  SpO2: 95% 98%   5/26- BP and pulse controlled  11.  Hypothyroidism. Continue Synthroid 12.  Hyperlipidemia. Lipitor 13.  History of tobacco abuse.  Counseling  14. Acute inferior MI- CP resolved - on IV heparin, hx APA, Neuro rec warfarin , Plavix added per cardiology  15.  Daily cbc for now given multiple anticoagulants in transition  Hgb stable   LOS: 22 days A FACE TO FACE  EVALUATION WAS PERFORMED  Erick Colace 09/07/2019, 7:41 AM

## 2019-09-07 NOTE — Progress Notes (Signed)
ANTICOAGULATION CONSULT NOTE - Follow Up Consult  Pharmacy Consult for Coumadin Indication: APLS/PE/hx CVA + new CVA  No Known Allergies  Patient Measurements: Height: 5\' 9"  (175.3 cm) Weight: 87.4 kg (192 lb 10.9 oz) IBW/kg (Calculated) : 70.7 Heparin Dosing Weight:   Vital Signs: Temp: 98.5 F (36.9 C) (05/27 0602) Temp Source: Oral (05/27 0602) BP: 155/68 (05/27 0602) Pulse Rate: 65 (05/27 0602)  Labs: Recent Labs    09/05/19 0435 09/05/19 0435 09/06/19 0028 09/07/19 0548  HGB 13.9   < > 13.9 14.0  HCT 42.2  --  41.7 42.7  PLT 241  --  238 220  APTT 69*  --  97*  --   LABPROT 13.0  --  13.9 16.2*  INR 1.0  --  1.1 1.4*  HEPARINUNFRC 0.80*  --  0.68 0.71*   < > = values in this interval not displayed.    Estimated Creatinine Clearance: 75.6 mL/min (by C-G formula based on SCr of 0.91 mg/dL).  Assessment: Anticoag: Xarelto PTA for hx APLS/PE/hx CVA + new CVA.- INR goal 2-2.5 - Neuro wanted Pradaxa but insurance would not cover. - Started Eliquis 5/5-5/22. Edu done w/ dtr before CIR AVS in. D/c ASA. - 5/22: hold apix and start IV heparin for CP (Tropn 4.3>>27000) - 5/24: Start Coumadin.  - 5/27: HL 0.71 slightly >goal (should target 0.3-0.5). INR up to 1.4 after 3 doses. CBC WNL.  Goal of Therapy:  Hep level 0.3-0.5 INR 2-2.5 Monitor platelets by anticoagulation protocol: Yes   Plan:  - Decrease IV heparin to 1000 units/hr until INR>=2.   - Daily HL, CBC, INR - Start Coumadin 4mg  po x1.   Dannel Rafter S. 01-13-2002, PharmD, BCPS Clinical Staff Pharmacist [email or web address] , Merilynn Finland 09/07/2019,7:52 AM

## 2019-09-07 NOTE — Progress Notes (Signed)
Physical Therapy Session Note  Patient Details  Name: Roger Ramirez MRN: [id number] Date of Birth: 12/03/1945  Today's Date: 09/07/2019 PT Individual Time:1000-1103 and 1425-1505 PT Individual Time Calculation (min): 63 min and 40 min   Short Term Goals: Week 3:  PT Short Term Goal 1 (Week 3): = to LTGs based on ELOS  Skilled Therapeutic Interventions/Progress Updates:    Session 1: Pt received sitting in w/c and agreeable to therapy session with minimal encouragement. Therapist reinforced education and therapy schedule while in CIR. Pt denies need to use bathroom at this time stating "in a little while." Transported to/from gym in w/c for time management and energy conservation. Sit>stand w/c>RW with min assist promoting increased anterior weight shift - cuing for R hand placement on RW and L UE pushing on w/c armrest. Gait ~82ft to EOM using RW with min/mod assist for balance - cuing for increased B LE (R more impaired) step lengths as pt pushing AD too far forward with delayed initiation of stepping. Noted BM smell - pt requesting to return to room. L stand pivot to w/c using RW with min assist - cuing for sequencing of AD and LE stepping. Stand pivot w/c>BSC over toilet using grab bar with min assist for balance - pt able to pull pants off hips without assist but cuing with CGA/min assist for balance. Continent of BM. Sit>stand BSC over toilet>grab bar with CGA for safety - performed posterior peri-care total assist for cleanliness. Donned clean brief total assist and pulled pants over hips mod assist. R stand pivot to w/c using grab bars with min assist - continues to have increased difficulty performing R LE lateral step during transfer.  Transported to/from gym in w/c for time management and energy conservation. Sit>stand w/c>RW with min assist for anterior trunk weight shift - educated pt on bringing head forward to front of RW when going to stand and sit as visual cue. Gait training ~23ft using  RW with CGA progressed to min assist when turning to sit due to decreased R LE step length and poor lateral step. Standing with UE support on RW performed R LE lateral cone taps with CGA for balance. Performed block practice stand pivot transfers EOM<>w/c using RW with focus on sequencing and recall/carryover of education training - initially required min assist progressed to CGA in a quiet gym for focus - cues to step R LE towards back R RW leg when turning to promote increased R lateral step length. Transported back to room and left seated in w/c with needs in reach and seat belt alarm on.  Session 2: Pt received sitting in w/c and agreeable to therapy session.  Transported to/from gym in w/c for time management and energy conservation. Stand pivot to EOM using RW with min assist to come to standing (required 2nd attempt as pt initially fell back into chair due to lack of adequate anterior trunk lean) and then CGA for steadying while turning - question cuing for carryover of proper sequencing of transfers (leaning head forward to front of RW; L hand pushing up from w/c with R hand on RW; stepping R foot towards back R RW leg when turning) - pt demonstrates some carryover of training. Performed standing balance, dual-cognitive, and sit<>stand retraining task focusing on anterior trunk lean coming to stand and L lateral weight shift and upright trunk posture once in standing via grasping cards from low bench and matching to pattern on high surface - min assist for balance throughout. Gait ~71ft  to // bars using RW with min/light mod assist for balance and AD management due to pt pushing RW excessively far forward and lacking adequate R LE foot clearance/step length -  cuing for sequencing/awareness of LE stepping and AD management. In // bars with B UE support performed R/L side stepping progressed to only L UE support on bar - maintains forward trunk flexion and lacks adequate R/L lateral weight shift onto stance  limb - min assist and manual facilitation for weight shift. Transitioned to R/L lateral stepping over hockey stick with B UE support on // bar progressed to only L UE support with min/mod assist for balance due to R posterior trunk lean. Performed repeated R LE foot taps on 6" block, no UE support, with pt demonstrating slight improvement of posture but still forward flexed and requires min assist for balance until posterior LOB resulting in him sitting back on mat. R stand pivot to w/c using RW with min assist for balance and repeated question cuing for carryover of training earlier to step R foot toward RW. Transported back to room and left seated in w/c with needs in reach and seat belt alarm on.   Therapy Documentation Precautions:  Precautions Precautions: Fall Precaution Comments: posterior and right lean in standing Restrictions Weight Bearing Restrictions: No  Pain:   Session 1: Denies pain during session.  Session 2: Denies pain during session.   Therapy/Group: Individual Therapy  Tawana Scale, PT, DPT 09/07/2019, 7:53 AM

## 2019-09-07 NOTE — Plan of Care (Signed)
  Problem: Consults Goal: RH STROKE PATIENT EDUCATION Description: See Patient Education module for education specifics  Outcome: Progressing   Problem: RH BOWEL ELIMINATION Goal: RH STG MANAGE BOWEL WITH ASSISTANCE Description: STG Manage Bowel with min Assistance. Outcome: Progressing Goal: RH STG MANAGE BOWEL W/MEDICATION W/ASSISTANCE Description: STG Manage Bowel with Medication with mod I Assistance. Outcome: Progressing   Problem: RH BLADDER ELIMINATION Goal: RH STG MANAGE BLADDER WITH ASSISTANCE Description: STG Manage Bladder With min Assistance Outcome: Progressing   Problem: RH SAFETY Goal: RH STG ADHERE TO SAFETY PRECAUTIONS W/ASSISTANCE/DEVICE Description: STG Adhere to Safety Precautions With cues/reminders Assistance/Device. Outcome: Progressing   Problem: RH COGNITION-NURSING Goal: RH STG USES MEMORY AIDS/STRATEGIES W/ASSIST TO PROBLEM SOLVE Description: STG Uses Memory Aids/Strategies With cues/reminders Assistance to Problem Solve. Outcome: Progressing   Problem: RH KNOWLEDGE DEFICIT Goal: RH STG INCREASE KNOWLEDGE OF HYPERTENSION Description: Pt/family able to demonstrate understanding of HTN management using diet control and medication compliance with booklets/handouts mod I assist prior to DC.  Outcome: Progressing Goal: RH STG INCREASE KNOWLEGDE OF HYPERLIPIDEMIA Description: Pt/family able to demonstrate understanding of HLD management using diet control and medication compliance with booklets/handouts mod I assist prior to DC.  Outcome: Progressing Goal: RH STG INCREASE KNOWLEDGE OF STROKE PROPHYLAXIS Description: Pt/family able to demonstrate understanding of stroke prevention management using diet control and medication compliance with booklets/handouts mod I assist prior to DC.  Outcome: Progressing   

## 2019-09-07 NOTE — Progress Notes (Signed)
Occupational Therapy Session Note  Patient Details  Name: Roger Ramirez MRN: [id number] Date of Birth: 10-06-1945  Today's Date: 09/07/2019 OT Individual Time: 1324-4010 OT Individual Time Calculation (min): 55 min    Short Term Goals: Week 3:  OT Short Term Goal 1 (Week 3): Continue working on established LTGs set at [employer].  Skilled Therapeutic Interventions/Progress Updates:    Pt began with transfer to the wheelchair from the bed with overall mod assist and use of the RW for support.  Worked on washing and cleaning peri area at the sink and donning new brief and pants secondary to wet brief.  Mod assist for all sit to stand transitions with max instructional cueing to work on upright posture and to maintain midline orientation secondary to right lean.  Pt with decreased ability to correct when having to divide his attention between standing and pulling garments up.  Decreased efficiency with donning clothing over the RLE secondary to right lean as well.  Suggested by therapist to cross the RLE over the left knee to help with this, however without physical intervention, he would just continue to struggle and keep trying the same way that was unsuccessful.  He was able to donn the pullover shirt with supervision after IV was ran through it.  Finished session with transport to the therapy gym where he completed 5 mins of BUE strengthening from the wheelchair with use of the UE ergonometer.  He was able to maintain RPMs at 20-23 with resistance on level 6.  HR at 86 with O2 at 98 on room air post exercise.  Finished session with transfer back to the room and pt left sitting up in the wheelchair with NT in as well to supervise for breakfast.  Call button and phone in reach with safety belt in place.    Therapy Documentation Precautions:  Precautions Precautions: Fall Precaution Comments: posterior and right lean in standing Restrictions Weight Bearing Restrictions: No  Pain: Pain  Assessment Pain Scale: Faces Pain Score: 0-No pain ADL: See Care Tool Section for some details of mobility and selfcare  Therapy/Group: Individual Therapy  Aurianna Earlywine OTR/L 09/07/2019, 9:00 AM

## 2019-09-07 NOTE — Progress Notes (Signed)
Speech Language Pathology Weekly Progress and Session Note  Patient Details  Name: Roger Ramirez MRN: [id number] Date of Birth: Sep 17, 1945  Beginning of progress report period: Aug 31, 2019 End of progress report period: Sep 07, 2019  Today's Date: 09/07/2019 SLP Individual Time: 5176-1607 SLP Individual Time Calculation (min): 43 min  Short Term Goals: Week 3: SLP Short Term Goal 1 (Week 3): Pt will recall 2 safety precaution with Mod A multimodal cues. SLP Short Term Goal 1 - Progress (Week 3): Progressing toward goal SLP Short Term Goal 2 (Week 3): Pt will use external aids to recall daily information with Mod A. SLP Short Term Goal 2 - Progress (Week 3): Partly met SLP Short Term Goal 3 (Week 3): Pt will demonstrate use word finding strategies in functional communication exchanges with Mod I. SLP Short Term Goal 3 - Progress (Week 3): Progressing toward goal SLP Short Term Goal 4 (Week 3): Pt will demonstate ability to problem solve during basic and familiar tasks with Min A. SLP Short Term Goal 4 - Progress (Week 3): Progressing toward goal SLP Short Term Goal 5 (Week 3): Pt will identify 2 cognitive and 2 physical impairment with Mod A multimodal cues. SLP Short Term Goal 5 - Progress (Week 3): Progressing toward goal    New Short Term Goals: Week 4: SLP Short Term Goal 1 (Week 4): STG=LTG due to remaining LOS  Weekly Progress Updates: Pt has made very minimal functional gains and met 0 out of 5 short term goals (1 partially met, but due to fluctuating level of Mod-Max assist required, could not justify fully met). Pt is currently Mod-Max assist for basic tasks due to cognitive impairments impacting his functional problem solving, memory, attention, intellectual and emergent awareness. Pt requires full supervision during meals due to his cognitive-linguistic impairments, to facilitate sustained attention to intake and ensure safe positioning. Pt and family education is ongoing.  Family has determined due to level of assist required, most appropriate venue of care is SNF (this is also what CIR team recommended). Therefore, pt is currently awaiting SNF placement. Pt would continue to benefit from skilled ST while inpatient in order to maximize functional independence and reduce burden of care prior to discharge. Anticipate that pt will need 24/7 supervision at discharge in addition to [facility] follow up at next level of care.       Intensity: Minumum of 1-2 x/day, 30 to 90 minutes Frequency: 3 to 5 out of 7 days Duration/Length of Stay: Pending SNF placement Treatment/Interventions: Cueing hierarchy;Functional tasks;Patient/family education;Therapeutic Activities;Internal/external aids;Cognitive remediation/compensation;Speech/Language facilitation;Environmental controls   Daily Session  Skilled Therapeutic Interventions: Pt was seen for skilled ST targeting cognition. Pt expressed desire to turn on his phone, but needed assistance with recall. Overall Max A required for problem solving and recall for turning phone on and off. Pt matched picture tiles by category X4 with Supervision A for problem solving. Pt then required Mod A for immediate recall task with the same picture tiles. Overall Max A multimodal cues required for any functional use of memory notebook. External aid posted within pt's direct line of sight to aid in awareness and recall to call for assistance prior to attempting to get up out of chair or bed while in hospital. Pt left sitting in chair with alarm set and needs within reach. Continue per current plan of care.        Pain Pain Assessment Pain Scale: 0-10 Pain Score: 0-No pain  Therapy/Group: Individual Therapy  Dani Gobble  Braden Deloach 09/07/2019, 7:07 AM

## 2019-09-08 ENCOUNTER — Inpatient Hospital Stay (HOSPITAL_COMMUNITY): Payer: Medicare Other | Admitting: Occupational Therapy

## 2019-09-08 ENCOUNTER — Inpatient Hospital Stay (HOSPITAL_COMMUNITY): Payer: Medicare Other | Admitting: Speech Pathology

## 2019-09-08 ENCOUNTER — Inpatient Hospital Stay (HOSPITAL_COMMUNITY): Payer: Medicare Other | Admitting: Physical Therapy

## 2019-09-08 LAB — CBC
HCT: 42.8 % (ref 39.0–52.0)
Hemoglobin: 14.1 g/dL (ref 13.0–17.0)
MCH: 30.4 pg (ref 26.0–34.0)
MCHC: 32.9 g/dL (ref 30.0–36.0)
MCV: 92.2 fL (ref 80.0–100.0)
Platelets: 225 10*3/uL (ref 150–400)
RBC: 4.64 MIL/uL (ref 4.22–5.81)
RDW: 13.9 % (ref 11.5–15.5)
WBC: 7.8 10*3/uL (ref 4.0–10.5)
nRBC: 0 % (ref 0.0–0.2)

## 2019-09-08 LAB — GLUCOSE, CAPILLARY
Glucose-Capillary: 109 mg/dL — ABNORMAL HIGH (ref 70–99)
Glucose-Capillary: 141 mg/dL — ABNORMAL HIGH (ref 70–99)
Glucose-Capillary: 146 mg/dL — ABNORMAL HIGH (ref 70–99)
Glucose-Capillary: 126 mg/dL — ABNORMAL HIGH (ref 70–99)

## 2019-09-08 LAB — PROTIME-INR
INR: 1.5 — ABNORMAL HIGH (ref 0.8–1.2)
Prothrombin Time: 17.6 seconds — ABNORMAL HIGH (ref 11.4–15.2)

## 2019-09-08 LAB — HEPARIN LEVEL (UNFRACTIONATED): Heparin Unfractionated: 0.44 IU/mL (ref 0.30–0.70)

## 2019-09-08 MED ORDER — WARFARIN SODIUM 3 MG PO TABS
6.0000 mg | ORAL_TABLET | Freq: Once | ORAL | Status: AC
  Administered 2019-09-08: 6 mg via ORAL
  Filled 2019-09-08: qty 2

## 2019-09-08 NOTE — Progress Notes (Signed)
Occupational Therapy Session Note  Patient Details  Name: Roger Ramirez MRN: [id number] Date of Birth: 1946-03-11  Today's Date: 09/08/2019 OT Individual Time: 1403-1450 OT Individual Time Calculation (min): 47 min    Short Term Goals: Week 3:  OT Short Term Goal 1 (Week 3): Continue working on established LTGs set at [employer].  Skilled Therapeutic Interventions/Progress Updates:  Patient met seated in wc in agreement with OT treatment session with focus on self-care re-education, functional transfers as detailed below. Upon entry OT noticed blood present at IV site. RN notified. Prior to leaving room, patient declined toileting tasks. Wc transport to therapy gym with total A. Patient completed stand-pivot transfer from wc <> mat table with use of RW, Min A and cueing for hand placement, proximity, and walker management. Patient completed sit <> stand x5 trials with Min A and multimodal cues for normalized movement patterns. Patient indicating need for BM. Total A for wc transport back to room. Functional mobility into bathroom with RW, Min A and cueing for walker management and incline negotiation. Toilet transfer to [facility] over standard toilet with Min A. OT provided education on safety with toileting including waiting for assistance for sit to stand from commode. Patient expressed verbal understanding. Patient attempting to stand from commode without assistance resulting in a fall to the R with patient hitting R shoulder and elbow on the ground. OT pulled call light in bathroom. Report given to charge RN and patient assisted to wc from ground level with +2 assist. Session concluded with patient seated in wc with belt alarm activated. RN and NT present for patient assessment.   Therapy Documentation Precautions:  Precautions Precautions: Fall Precaution Comments: posterior and right lean in standing Restrictions Weight Bearing Restrictions: No General:    Therapy/Group: Individual  Therapy  Melanye Hiraldo R [place]-Davis 09/08/2019, 12:29 PM

## 2019-09-08 NOTE — Plan of Care (Signed)
  Problem: Consults Goal: RH STROKE PATIENT EDUCATION Description: See Patient Education module for education specifics  Outcome: Progressing   Problem: RH BOWEL ELIMINATION Goal: RH STG MANAGE BOWEL WITH ASSISTANCE Description: STG Manage Bowel with min Assistance. Outcome: Progressing Goal: RH STG MANAGE BOWEL W/MEDICATION W/ASSISTANCE Description: STG Manage Bowel with Medication with mod I Assistance. Outcome: Progressing   Problem: RH BLADDER ELIMINATION Goal: RH STG MANAGE BLADDER WITH ASSISTANCE Description: STG Manage Bladder With min Assistance Outcome: Progressing   Problem: RH SAFETY Goal: RH STG ADHERE TO SAFETY PRECAUTIONS W/ASSISTANCE/DEVICE Description: STG Adhere to Safety Precautions With cues/reminders Assistance/Device. Outcome: Progressing   Problem: RH COGNITION-NURSING Goal: RH STG USES MEMORY AIDS/STRATEGIES W/ASSIST TO PROBLEM SOLVE Description: STG Uses Memory Aids/Strategies With cues/reminders Assistance to Problem Solve. Outcome: Progressing   Problem: RH KNOWLEDGE DEFICIT Goal: RH STG INCREASE KNOWLEDGE OF HYPERTENSION Description: Pt/family able to demonstrate understanding of HTN management using diet control and medication compliance with booklets/handouts mod I assist prior to DC.  Outcome: Progressing Goal: RH STG INCREASE KNOWLEGDE OF HYPERLIPIDEMIA Description: Pt/family able to demonstrate understanding of HLD management using diet control and medication compliance with booklets/handouts mod I assist prior to DC.  Outcome: Progressing Goal: RH STG INCREASE KNOWLEDGE OF STROKE PROPHYLAXIS Description: Pt/family able to demonstrate understanding of stroke prevention management using diet control and medication compliance with booklets/handouts mod I assist prior to DC.  Outcome: Progressing   

## 2019-09-08 NOTE — Progress Notes (Signed)
Patient ID: Roger Ramirez, male   DOB: Aug 18, 1945, 74 y.o.   MRN: [id number]  Patient transfer packet left at nursing station for charge nurse on Monday

## 2019-09-08 NOTE — Progress Notes (Signed)
Patient's family was notified of fall. Daughter came to visit patient. Patient denies any pains or discomfort. Will continue to monitor. Leane Para, LPN

## 2019-09-08 NOTE — Progress Notes (Signed)
Occupational Therapy Session Note  Patient Details  Name: Roger Ramirez MRN: [id number] Date of Birth: June 24, 1945  Today's Date: 09/08/2019 OT Individual Time: 1107-1202 OT Individual Time Calculation (min): 55 min    Short Term Goals: Week 3:  OT Short Term Goal 1 (Week 3): Continue working on established LTGs set at [employer].  Skilled Therapeutic Interventions/Progress Updates:    Pt was seen for OT treatment this am.  Worked on transition from supine to sitting with mod assist and max instructional cueing.  He then completed stand pivot transfer to the wheelchair with use of the RW for support.  He was then transitioned over to the sink for washing of his buttocks and peri area sit to stand and for donning clothing.  Min assist was needed for sit to stand at the sink with mod instructional cueing for technique.  While standing to wash, he exhibited some bowel incontinence with transfer to the toilet then completed with mod assist.  He was able to have a small bowel movement and complete toilet hygiene with mod assist.  He then donned the brief and pants with min assist sit to stand with transfer back out to the wheelchair at the same level.  He finished donning pullover shirt with setup as well to complete session.  He was left in the wheelchair with call button and phone in reach and safety belt in place.    Therapy Documentation Precautions:  Precautions Precautions: Fall Precaution Comments: posterior and right lean in standing Restrictions Weight Bearing Restrictions: No   Pain: Pain Assessment Pain Scale: Faces Pain Score: 0-No pain ADL: See Care Tool Section for some details of mobility and selfcare  Therapy/Group: Individual Therapy  Bertine Schlottman OTR/L 09/08/2019, 12:25 PM

## 2019-09-08 NOTE — Progress Notes (Signed)
ANTICOAGULATION CONSULT NOTE - Follow Up Consult  Pharmacy Consult for Coumadin Indication: APLS/PE/hx CVA + new CVA  No Known Allergies  Patient Measurements: Height: 5\' 9"  (175.3 cm) Weight: 87.4 kg (192 lb 10.9 oz) IBW/kg (Calculated) : 70.7 Heparin Dosing Weight:   Vital Signs: Temp: 98.3 F (36.8 C) (05/28 0603) BP: 134/68 (05/28 0804) Pulse Rate: 68 (05/28 0804)  Labs: Recent Labs    09/06/19 0028 09/06/19 0028 09/07/19 0548 09/08/19 0735  HGB 13.9   < > 14.0 14.1  HCT 41.7  --  42.7 42.8  PLT 238  --  220 225  APTT 97*  --   --   --   LABPROT 13.9  --  16.2* 17.6*  INR 1.1  --  1.4* 1.5*  HEPARINUNFRC 0.68  --  0.71* 0.44   < > = values in this interval not displayed.    Estimated Creatinine Clearance: 75.6 mL/min (by C-G formula based on SCr of 0.91 mg/dL).  Assessment:  Anticoag: Xarelto PTA for hx APLS/PE/hx CVA + new CVA.- INR goal 2-2.5. - Neuro wanted Pradaxa but insurance would not cover. - Started Eliquis 5/5-5/22. Edu done w/ dtr before CIR AVS in. D/c ASA. - 5/22: hold apix and start IV heparin for CP (Tropn 4.3>>27000) - 5/24: Start Coumadin.  - 5/27: HL 0.44 in goal (0.3-0.5). INR up to 1.5. CBC remains WNL  Goal of Therapy:  Hep level 0.3-0.5 INR 2-2.5 Monitor platelets by anticoagulation protocol: Yes   Plan:  - Con't IV heparin 1000 units/hr until INR>=2.  - Daily HL, CBC, INR - Start Coumadin 6mg  po x1   Kalin Amrhein S. 01-13-2002, PharmD, BCPS Clinical Staff Pharmacist [email or web address] , Merilynn Finland 09/08/2019,8:37 AM

## 2019-09-08 NOTE — Progress Notes (Signed)
Patient ID: Roger Ramirez, male   DOB: July 02, 1945, 74 y.o.   MRN: [id number]  Sw followed up with family/patient for any questions/concerns we transition into weekend, no concerns. Patient anticipated to tranfers to [facility] on Monday. Nursing please inform daughter of time set for transport.

## 2019-09-08 NOTE — Progress Notes (Signed)
PHYSICAL MEDICINE & REHABILITATION PROGRESS NOTE   Subjective/Complaints:   No issues overnite    ROS: Pt denies SOB, abd pain, CP, N/V/C/D,       Objective:   No results found. Recent Labs    09/07/19 0548 09/08/19 0735  WBC 8.0 7.8  HGB 14.0 14.1  HCT 42.7 42.8  PLT 220 225   No results for input(s): NA, K, CL, CO2, GLUCOSE, BUN, CREATININE, CALCIUM in the last 72 hours.  Intake/Output Summary (Last 24 hours) at 09/08/2019 0850 Last data filed at 09/08/2019 0811 Gross per 24 hour  Intake 600 ml  Output --  Net 600 ml     Physical Exam: Vital Signs Blood pressure 134/68, pulse 68, temperature 98.3 F (36.8 C), resp. rate 18, height 5\' 9"  (1.753 m), weight 87.4 kg, SpO2 97 %.    General: No acute distress Mood and affect are appropriate Heart: Regular rate and rhythm no rubs murmurs or extra sounds Lungs: Clear to auscultation, breathing unlabored, no rales or wheezes Abdomen: Positive bowel sounds, soft nontender to palpation, nondistended Extremities: No clubbing, cyanosis, or edema  . Cranial nerves II through XII intact, motor strength is 5/5 in Bilateral  deltoid, bicep, tricep, grip, 5/5 left and 4/5 Right hip flexor, knee extensors, ankle dorsiflexor and plantar flexor Functional mobility: posterior lean in standing Musculoskeletal: Full range of motion in all 4 extremities. No joint swelling  Assessment/Plan: 1. Functional deficits secondary to left ACA infarct which require 3+ hours per day of interdisciplinary therapy in a comprehensive inpatient rehab setting.  Physiatrist is providing close team supervision and 24 hour management of active medical problems listed below.  Physiatrist and rehab team continue to assess barriers to discharge/monitor patient progress toward functional and medical goals  Care Tool:  Bathing  Bathing activity did not occur: Safety/medical concerns Body parts bathed by patient: Right arm, Left arm, Chest,  Abdomen, Front perineal area, Right upper leg, Left upper leg, Face, Right lower leg, Left lower leg   Body parts bathed by helper: Buttocks Body parts n/a: Right arm, Left arm, Chest, Abdomen, Right upper leg, Left lower leg, Right lower leg, Left upper leg, Face(not attempted this session)   Bathing assist Assist Level: Moderate Assistance - Patient 50 - 74%     Upper Body Dressing/Undressing Upper body dressing   What is the patient wearing?: Pull over shirt    Upper body assist Assist Level: Supervision/Verbal cueing    Lower Body Dressing/Undressing Lower body dressing      What is the patient wearing?: Incontinence brief, Pants     Lower body assist Assist for lower body dressing: Moderate Assistance - Patient 50 - 74%     Toileting Toileting    Toileting assist Assist for toileting: Moderate Assistance - Patient 50 - 74%     Transfers Chair/bed transfer  Transfers assist  Chair/bed transfer activity did not occur: Safety/medical concerns  Chair/bed transfer assist level: Minimal Assistance - Patient > 75% Chair/bed transfer assistive device: Walker, Armrests   Locomotion Ambulation   Ambulation assist   Ambulation activity did not occur: Safety/medical concerns  Assist level: Minimal Assistance - Patient > 75% Assistive device: Walker-rolling Max distance: 87ft   Walk 10 feet activity   Assist  Walk 10 feet activity did not occur: Safety/medical concerns  Assist level: Minimal Assistance - Patient > 75% Assistive device: Walker-rolling   Walk 50 feet activity   Assist Walk 50 feet with 2 turns activity did not  occur: Safety/medical concerns  Assist level: Moderate Assistance - Patient - 50 - 74% Assistive device: Walker-rolling    Walk 150 feet activity   Assist Walk 150 feet activity did not occur: Safety/medical concerns         Walk 10 feet on uneven surface  activity   Assist Walk 10 feet on uneven surfaces activity did not  occur: Safety/medical concerns         Wheelchair     Assist Will patient use wheelchair at discharge?: (TBD)             Wheelchair 50 feet with 2 turns activity    Assist            Wheelchair 150 feet activity     Assist          Blood pressure 134/68, pulse 68, temperature 98.3 F (36.8 C), resp. rate 18, height 5\' 9"  (1.753 m), weight 87.4 kg, SpO2 97 %.  Medical Problem List and Plan: 1.  Right side weakness with dysarthria secondary to left ACA territory acute infarction as well as history of CVA 2020 with cognitive deficits Cognition and RLE weakness are main issues              -patient may shower Ready for D/C once off heparin drip   Continue CIR PT, OT, SLP reduced schedule, ? Exercise tolerance post MI- ECHO without sig loss of EF    5/22- Acute inferior MI based on EKG- Troponin 4.335; f/u troponin >27k- no    2.  Antithrombotics: -DVT/anticoagulation: Heparin Has  antiphospholipid antibody syndrome with history of DVT/PE.  Consultation with neuro as well as cardiology, currently on IV heparin, will ask pharmacy to consult on warfarin dosing with INR goal of 2.0-2.5. added Plavix 75 mg/day INR 1.5- hopefully therapeutic by 5/31 3. Pain Management: Tylenol as needed. Well controlled 4. Mood: Melatonin 3 mg nightly             -antipsychotic agents: N/A 5. Neuropsych: This patient is capable of making decisions on his own behalf. Poor concentration attention, initiation , , methylphenidate trial not helpful, have discontinued   6. Skin/Wound Care: Routine skin checks 7. Fluids/Electrolytes/Nutrition: Routine in and outs. CMP ordered. 8.  History of saddle pulmonary emboli.  Continue Eliquis 9.  Diabetes mellitus.  Hemoglobin A1c 6.1.  SSI.  Patient on Glucophage 1000 mg twice daily prior to admission.  Resume as needed CBG (last 3)  Recent Labs    09/07/19 1638 09/07/19 2058 09/08/19 0614  GLUCAP 129* 132* 109*       5/28- BGs  controlled- con't meds 10.  Hypertension.  Avapro 75 mg daily.           Vitals:   09/08/19 0603 09/08/19 0804  BP: (!) 148/68 134/68  Pulse: (!) 56 68  Resp: 18   Temp: 98.3 F (36.8 C)   SpO2: 97%    5/28- BP and pulse controlled  11.  Hypothyroidism. Continue Synthroid 12.  Hyperlipidemia. Lipitor 13.  History of tobacco abuse.  Counseling  14. Acute inferior MI- CP resolved - on IV heparin, hx APA, Neuro rec warfarin , Plavix added per cardiology  15.  Daily cbc for now given multiple anticoagulants in transition  Hgb stable   LOS: 23 days A FACE TO FACE EVALUATION WAS PERFORMED  6/28 09/08/2019, 8:50 AM

## 2019-09-08 NOTE — Progress Notes (Signed)
Speech Language Pathology Daily Session Note  Patient Details  Name: Roger Ramirez MRN: [id number] Date of Birth: 09-13-1945  Today's Date: 09/08/2019 SLP Individual Time: 0816-0900 SLP Individual Time Calculation (min): 44 min  Short Term Goals: Week 4: SLP Short Term Goal 1 (Week 4): STG=LTG due to remaining LOS  Skilled Therapeutic Interventions: Pt was seen for skilled ST targeting cognition. Pt oriented to date with Min A, independently oriented to place and situation. He also recalled having a "heart attack" in conversation. Pt required Max A for recall of how to turn cell phone on and off, however decreased Mod A for familiar problem solving. During a phone call with family member, pt required Supervision A verbal cues I for functional communication and correcting semantic errors. Pt only required Min A verbal and visual cues for problem solving during a basic 3-step action card sequencing task. Memory notebook updated and pt left in bed with alarm set and needs within reach. Continue per current plan of care.        Pain Pain Assessment Pain Scale: 0-10 Pain Score: 0-No pain  Therapy/Group: Individual Therapy  Roger Ramirez 09/08/2019, 7:02 AM

## 2019-09-08 NOTE — Progress Notes (Signed)
Physical Therapy Weekly Progress Note  Patient Details  Name: Roger Ramirez MRN: [id number] Date of Birth: [birth year]  Beginning of progress report period: Sep 01, 2019 End of progress report period: Sep 08, 2019  Today's Date: 09/08/2019 PT Individual Time: 5643-3295 PT Individual Time Calculation (min): 29 min   Patient has met 0 of 1 short term goals. Short term goals were not set this past week based on ELOS. Roger Ramirez continues to demonstrate significant cognitive impairments impacting his progression with mobility as well as poor trunk control, impaired midline awareness with impaired awareness when LOB is occurring, poor balance recovery strategies often falling backwards to the R with significantly delayed and insufficient attempts at correction. He performs supine<>sit with mod assist, sit<>stands with varying min/mod assist, stand pivot transfers using RW with min/mod assist, and gait up to 48f using RW with mod assist. His level of assistance continues to fluctuate with fatigue, time of day, and his attention to the task.  Patient continues to demonstrate the following deficits muscle weakness and muscle joint tightness, decreased cardiorespiratoy endurance, impaired timing and sequencing, abnormal tone, unbalanced muscle activation and decreased motor planning,  , decreased initiation, decreased attention, decreased awareness, decreased problem solving, decreased safety awareness, decreased memory and delayed processing and decreased sitting balance, decreased standing balance, decreased postural control and decreased balance strategies and therefore will continue to benefit from skilled PT intervention to increase functional independence with mobility.  Patient progressing toward long term goals..  Continue plan of care.  PT Short Term Goals Week 3:  PT Short Term Goal 1 (Week 3): = to LTGs based on ELOS PT Short Term Goal 1 - Progress (Week 3): Progressing toward goal Week 4:  PT  Short Term Goal 1 (Week 4): = to LTGs based on ELOS  Skilled Therapeutic Interventions/Progress Updates:  Ambulation/gait training;Community reintegration;DME/adaptive equipment instruction;Neuromuscular re-education;Psychosocial support;Stair training;UE/LE Strength taining/ROM;Wheelchair propulsion/positioning;Balance/vestibular training;Discharge planning;Functional electrical stimulation;Pain management;Skin care/wound management;Therapeutic Activities;UE/LE Coordination activities;Cognitive remediation/compensation;Disease management/prevention;Functional mobility training;Patient/family education;Splinting/orthotics;Therapeutic Exercise;Visual/perceptual remediation/compensation   Pt received sitting in w/c and agreeable to therapy session. Pt reports he does not feel the urge to use the bathroom at this time.  Transported to/from gym in w/c for time management and energy conservation. Started sit>stand w/c>RW with max cuing for carryover of R hand on RW, L hand pushing up from w/c arm rest, and bringing head towards front of RW but pt unable to bring trunk forward enough causing posterior LOB back into chair. Pt reports now feeling urge to have BM. Transported back to room. Stand pivot w/c>BSC over toilet using grab bar with CGA/min assist and pt able to pull pants down with min assist. Incontinent of bladder in brief and continent of bowels on toilet. Seated peri-care with supervision for balance and standing using grab bar with CGA for safety while therapist performed total assist posterior peri-care for cleanliness. Standing with CGA/min assist using grab bar support as needed pull pants over hips with mod assist. R stand pivot back to w/c using grab bar with min assist as pt continues to lack full R lateral stepping causing R lean/LOB. Sitting hand hygiene at sink. Pt left seated in w/c with needs in reach and seat belt alarm on.  Therapy Documentation Precautions:  Precautions Precautions:  Fall Precaution Comments: posterior and right lean in standing Restrictions Weight Bearing Restrictions: No  Pain: Denies pain during session.  Therapy/Group: Individual Therapy  CTawana Ramirez PT, DPT 09/08/2019, 12:57 PM

## 2019-09-08 NOTE — Progress Notes (Signed)
09/08/19 1449  What Happened  Was fall witnessed? No  Was patient injured? No  Patient found in bathroom (working with OT )  Found by Staff-comment (Destanae, OT)  Stated prior activity other (comment) (pt was assisted to bathroom by OT, but pt got up unassisted)  Follow Up  MD notified Deatra Ina, PA  Time MD notified 1510  Family notified Yes - comment Cristela Blue- Daughter)  Time family notified 1517  Additional tests  (no injuries. no further test needed)  Simple treatment Other (comment) (None needed)  Progress note created (see row info) Yes  Adult Fall Risk Assessment  Risk Factor Category (scoring not indicated) Fall has occurred during this admission (document High fall risk)  Age 74  Fall History: Fall within 6 months prior to admission 5  Elimination; Bowel and/or Urine Incontinence 2  Elimination; Bowel and/or Urine Urgency/Frequency 0  Medications: includes PCA/Opiates, Anti-convulsants, Anti-hypertensives, Diuretics, Hypnotics, Laxatives, Sedatives, and Psychotropics 5  Patient Care Equipment 1  Mobility-Assistance 2  Mobility-Gait 2  Mobility-Sensory Deficit 2  Altered awareness of immediate physical environment 1  Impulsiveness 2  Lack of understanding of one's physical/cognitive limitations 4  Total Score 28  Patient Fall Risk Level High fall risk  Adult Fall Risk Interventions  Required Bundle Interventions *See Row Information* High fall risk - low, moderate, and high requirements implemented  Additional Interventions Lap belt while in chair/wheelchair;Use of appropriate toileting equipment (bedpan, BSC, etc.)  Screening for Fall Injury Risk (To be completed on HIGH fall risk patients) - Assessing Need for Low Bed  Risk For Fall Injury- Low Bed Criteria Previous fall this admission  Will Implement Low Bed and Floor Mats Yes  Screening for Fall Injury Risk (To be completed on HIGH fall risk patients who do not meet crieteria for Low Bed) - Assessing Need  for Floor Mats Only  Risk For Fall Injury- Criteria for Floor Mats Confusion/dementia (+NuDESC, CIWA, TBI, etc.);Noncompliant with safety precautions  Will Implement Floor Mats Yes  Vitals  Temp 98.4 F (36.9 C)  Temp Source Oral  BP (!) 161/85  MAP (mmHg) 108  BP Location Right Arm  BP Method Automatic  Patient Position (if appropriate) Sitting  Pulse Rate 82  Pulse Rate Source Monitor  Resp 16  Oxygen Therapy  SpO2 98 %  O2 Device Room Air  Pain Assessment  Pain Scale Faces  Pain Score 0  PCA/Epidural/Spinal Assessment  Respiratory Pattern Regular;Unlabored  Neurological  Neuro (WDL) X  Level of Consciousness Alert  Orientation Level Oriented to place;Oriented to person  Cognition Impulsive;Poor attention/concentration;Poor judgement;Poor safety awareness  Speech Clear  Pupil Assessment  No  R Hand Grip Moderate  L Hand Grip Moderate   RUE Motor Response Purposeful movement  RUE Sensation Full sensation  RUE Motor Strength 4  LUE Motor Response Purposeful movement  LUE Sensation Full sensation  LUE Motor Strength 4  RLE Motor Response Purposeful movement  RLE Sensation Full sensation  RLE Motor Strength 4  LLE Motor Response Purposeful movement  LLE Sensation Full sensation  LLE Motor Strength 4  Neuro Symptoms None  Neuro symptoms relieved by Rest  Musculoskeletal  Musculoskeletal (WDL) X  Assistive Device Four wheel walker;Wheelchair;Stedy  Generalized Weakness Yes  Weight Bearing Restrictions No  Integumentary  Integumentary (WDL) X  Skin Color Appropriate for ethnicity  Skin Condition Dry  Skin Integrity Ecchymosis;MASD  Ecchymosis Location Arm  Ecchymosis Location Orientation Right;Left  Moisture Associated Skin Damage Location Buttocks;Sacrum  Moisture Associated Skin Damage  Orientation Right;Left  Moisture Associated Skin Damage Intervention Cleansed;Barrier cream;Other (Comment)  OT assisted pt to bathroom to use toilet. Pt left on toilet and OT  turned around to gather items. Pt stated he "got off toilet and slipped" OT states she was at the door, instructed pt not to get up without calling her. OT then found pt on the bathroom floor, leaning against the wall. Pt denies hitting head. Denies pain. No injuries observed on skin. Linna Hoff PA and [place], pt's daughter are aware. Continue plan of care.   Gerald Stabs, RN

## 2019-09-09 ENCOUNTER — Inpatient Hospital Stay (HOSPITAL_COMMUNITY): Payer: Medicare Other | Admitting: Occupational Therapy

## 2019-09-09 LAB — CBC
HCT: 40.4 % (ref 39.0–52.0)
Hemoglobin: 13.3 g/dL (ref 13.0–17.0)
MCH: 30.4 pg (ref 26.0–34.0)
MCHC: 32.9 g/dL (ref 30.0–36.0)
MCV: 92.2 fL (ref 80.0–100.0)
Platelets: 221 10*3/uL (ref 150–400)
RBC: 4.38 MIL/uL (ref 4.22–5.81)
RDW: 13.8 % (ref 11.5–15.5)
WBC: 7.2 10*3/uL (ref 4.0–10.5)
nRBC: 0 % (ref 0.0–0.2)

## 2019-09-09 LAB — HEPARIN LEVEL (UNFRACTIONATED): Heparin Unfractionated: 0.48 IU/mL (ref 0.30–0.70)

## 2019-09-09 LAB — GLUCOSE, CAPILLARY
Glucose-Capillary: 100 mg/dL — ABNORMAL HIGH (ref 70–99)
Glucose-Capillary: 176 mg/dL — ABNORMAL HIGH (ref 70–99)
Glucose-Capillary: 140 mg/dL — ABNORMAL HIGH (ref 70–99)
Glucose-Capillary: 151 mg/dL — ABNORMAL HIGH (ref 70–99)

## 2019-09-09 LAB — PROTIME-INR
INR: 1.8 — ABNORMAL HIGH (ref 0.8–1.2)
Prothrombin Time: 19.9 seconds — ABNORMAL HIGH (ref 11.4–15.2)

## 2019-09-09 MED ORDER — WARFARIN SODIUM 5 MG PO TABS
5.0000 mg | ORAL_TABLET | Freq: Once | ORAL | Status: AC
  Administered 2019-09-09: 5 mg via ORAL
  Filled 2019-09-09: qty 1

## 2019-09-09 NOTE — Progress Notes (Signed)
Occupational Therapy Discharge Summary  Patient Details  Name: Roger Ramirez MRN: [id number] Date of Birth: 17-Aug-1945    Patient has met 7 of 8 long term goals due to improved activity tolerance, improved balance, postural control, ability to compensate for deficits and improved attention.  Patient to discharge at overall Mod Assist level.  Patient's care partner unavailable to provide the necessary physical and cognitive assistance at discharge.    Reasons goals not met: He continues to need min assist for dynamic sitting balance unsupported secondary to LOB posteriorly and to the right.  Recommendation:  Patient will benefit from ongoing skilled OT services in skilled nursing facility setting to continue to advance functional skills in the area of BADL and Reduce care partner burden.  Pt will continue to benefit from 24 hr assist and continued therapy at SNF setting as he still demonstrates decreased static and dynamic standing balance during performance of selfcare tasks as well functional transfers.  He also demonstrates decreased awareness and memory, resulting in the need to have 24 hour supervision.    Equipment: No equipment provided  Reasons for discharge: treatment goals met and discharge from hospital  Patient/family agrees with progress made and goals achieved: Yes  OT Discharge Precautions/Restrictions   Falls, decreased memory  ADL ADL Eating: Set up Where Assessed-Eating: Wheelchair Grooming: Setup Where Assessed-Grooming: Wheelchair Upper Body Bathing: Setup Where Assessed-Upper Body Bathing: Wheelchair Lower Body Bathing: Minimal assistance Where Assessed-Lower Body Bathing: Wheelchair, Sitting at sink, Standing at sink Upper Body Dressing: Setup Where Assessed-Upper Body Dressing: Wheelchair Lower Body Dressing: Moderate assistance Where Assessed-Lower Body Dressing: Standing at sink, Sitting at sink, Wheelchair Toileting: Moderate assistance Where  Assessed-Toileting: Bedside Commode Toilet Transfer: Moderate assistance Toilet Transfer Method: Counselling psychologist: Bedside commode, Energy manager: Moderate assistance Social research officer, government Method: Heritage manager: Gaffer Baseline Vision/History: Wears glasses Wears Glasses: Reading only Patient Visual Report: No change from baseline Vision Assessment?: No apparent visual deficits Ocular Range of Motion: Within Functional Limits Perception  Perception: Within Functional Limits Praxis Praxis: Impaired Praxis Impairment Details: Motor planning Cognition Overall Cognitive Status: Impaired/Different from baseline Arousal/Alertness: Awake/alert Orientation Level: Oriented to person;Oriented to place;Oriented to situation Attention: Sustained;Selective Focused Attention: Appears intact Sustained Attention: Appears intact Selective Attention: Impaired Memory: Impaired Memory Impairment: Decreased recall of new information;Decreased short term memory Decreased Short Term Memory: Verbal basic;Functional basic Immediate Memory Recall: Sock;Blue;Bed Awareness Impairment: Intellectual impairment Problem Solving: Impaired Problem Solving Impairment: Functional basic;Verbal basic Organizing: Impaired Decision Making: Impaired Initiating: Impaired Safety/Judgment: Impaired Comments: Pt still with decreased carryover and memory from session to session.  Decreased sustained and selective attention to tasks as well, as he will stop completing a task and then look at therapist for guidance.  Mod to max instructional cueing is needed for thorughness and sequencing with bathing. Sensation Sensation Light Touch: Appears Intact Hot/Cold: Appears Intact Proprioception: Appears Intact Stereognosis: Appears Intact Coordination Gross Motor Movements are Fluid and Coordinated: Yes Fine Motor Movements are Fluid and  Coordinated: Yes Coordination and Movement Description: BUE coordination WFLs for selfcare tasks and functional use with ADLs. Motor  Motor Motor: Abnormal tone;Abnormal postural alignment and control Motor - Skilled Clinical Observations: R hemiparesis Motor - Discharge Observations: Still with generalized weakness as well as posterior and right lean, but improved since eval Mobility  Bed Mobility Bed Mobility: Sit to Supine;Supine to Sit Supine to Sit: Moderate Assistance - Patient 50-74% Sit to Supine: Minimal Assistance - Patient >  75% Transfers Sit to Stand: Minimal Assistance - Patient > 75% Stand to Sit: Minimal Assistance - Patient > 75%  Trunk/Postural Assessment  Cervical Assessment Cervical Assessment: Exceptions to WFL(forward head) Thoracic Assessment Thoracic Assessment: Exceptions to WFL(thoracic rounding) Lumbar Assessment Lumbar Assessment: Exceptions to WFL(posterior pelvic tilt)  Balance Balance Balance Assessed: Yes Static Sitting Balance Static Sitting - Level of Assistance: 5: Stand by assistance Dynamic Sitting Balance Dynamic Sitting - Balance Support: No upper extremity supported;Feet supported;During functional activity Dynamic Sitting - Level of Assistance: 4: Min assist Static Standing Balance Static Standing - Balance Support: During functional activity Static Standing - Level of Assistance: 4: Min assist Dynamic Standing Balance Dynamic Standing - Balance Support: During functional activity;No upper extremity supported Dynamic Standing - Level of Assistance: 3: Mod assist Extremity/Trunk Assessment RUE Assessment RUE Assessment: Within Functional Limits LUE Assessment LUE Assessment: Within Functional Limits   Raylynn Hersh OTR/L 09/09/2019, 4:51 PM

## 2019-09-09 NOTE — Progress Notes (Signed)
ANTICOAGULATION CONSULT NOTE - Follow Up Consult  Pharmacy Consult for Coumadin Indication: APLS/PE/hx CVA + new CVA  No Known Allergies  Patient Measurements: Height: 5\' 9"  (175.3 cm) Weight: 87.4 kg (192 lb 10.9 oz) IBW/kg (Calculated) : 70.7  Vital Signs: Temp: 98.4 F (36.9 C) (05/29 0547) BP: 133/81 (05/29 0547) Pulse Rate: 69 (05/29 0547)  Labs: Recent Labs    09/07/19 0548 09/07/19 0548 09/08/19 0735 09/09/19 0739  HGB 14.0   < > 14.1 13.3  HCT 42.7  --  42.8 40.4  PLT 220  --  225 221  LABPROT 16.2*  --  17.6* 19.9*  INR 1.4*  --  1.5* 1.8*  HEPARINUNFRC 0.71*  --  0.44 0.48   < > = values in this interval not displayed.    Estimated Creatinine Clearance: 75.6 mL/min (by C-G formula based on SCr of 0.91 mg/dL).  Assessment:  Anticoag: Xarelto PTA for hx APLS/PE/hx CVA + new CVA.- INR goal 2-2.5. - Neuro wanted Pradaxa but insurance would not cover. - Started Eliquis 5/5-5/22. Edu done w/ dtr before CIR AVS in. D/c ASA. - 5/22: hold apix and start IV heparin for CP (Tropn 4.3>>27000) - 5/24: Start Coumadin.  - 5/27: HL 0.44 in goal (0.3-0.5). INR up to 1.5. CBC remains WNL - 5/29: HL 0.47 in goal (0.3-0.5). INR increasing appropriately to 1.8. No bleeding or problems with infusion per RN.    Goal of Therapy:  Hep level 0.3-0.5 INR 2-2.5 Monitor platelets by anticoagulation protocol: Yes   Plan:  - Decrease IV heparin to 950 units/hr given higher end of therapeutic and patient becoming close to INR. Continue IV heparin until INR>=2.  - Daily HL, CBC, INR - Give Coumadin 5 mg po x1   6/29, PharmD PGY1 Acute Care Pharmacy Resident 09/09/2019,8:29 AM

## 2019-09-09 NOTE — Progress Notes (Signed)
Occupational Therapy Session Note  Patient Details  Name: Roger Ramirez MRN: [id number] Date of Birth: Oct 31, 1945  Today's Date: 09/09/2019 OT Individual Time: 1005-1050 OT Individual Time Calculation (min): 45 min    Short Term Goals: Week 3:  OT Short Term Goal 1 (Week 3): Continue working on established LTGs set at [facility].  Skilled Therapeutic Interventions/Progress Updates:    Pt worked on bathing and dressing sit to stand at the sink this session as well as toilet transfers.  He was able to transfer from supine to sit EOB with mod assist.  He then completed stand pivot transfer to the wheelchair without RW and with mod assist from therapist.  He was able to move over to the sink for bathing with supervision and mod instructional cueing needed for UB.  Lower body bathing was completed sit to stand with mod assist secondary to bowel incontinence.  He completed toilet transfer as well with use of the RW as urgency hit him while standing and completing peri hygiene.  Min assist for transfer to the toilet, but he was unsuccessful with attempts to have a BM.  Therapist assisted with LB dressing tasks secondary to decreased time and pt transferred out to the wheelchair with min assist using the RW for support.  He was left sitting up in the wheelchair with call button and phone in reach and safety belt in place.    Therapy Documentation Precautions:  Precautions Precautions: Fall Precaution Comments: posterior and right lean in standing Restrictions Weight Bearing Restrictions: No  Pain:  No report of pain during session  ADL: See Care Tool Section for details of ADL performance  Therapy/Group: Individual Therapy  Joby Hershkowitz OTR/L 09/09/2019, 4:30 PM

## 2019-09-10 ENCOUNTER — Inpatient Hospital Stay (HOSPITAL_COMMUNITY): Payer: Medicare Other

## 2019-09-10 LAB — CBC
HCT: 41.5 % (ref 39.0–52.0)
Hemoglobin: 13.7 g/dL (ref 13.0–17.0)
MCH: 30.4 pg (ref 26.0–34.0)
MCHC: 33 g/dL (ref 30.0–36.0)
MCV: 92.2 fL (ref 80.0–100.0)
Platelets: 220 10*3/uL (ref 150–400)
RBC: 4.5 MIL/uL (ref 4.22–5.81)
RDW: 13.9 % (ref 11.5–15.5)
WBC: 8 10*3/uL (ref 4.0–10.5)
nRBC: 0 % (ref 0.0–0.2)

## 2019-09-10 LAB — GLUCOSE, CAPILLARY
Glucose-Capillary: 94 mg/dL (ref 70–99)
Glucose-Capillary: 130 mg/dL — ABNORMAL HIGH (ref 70–99)
Glucose-Capillary: 167 mg/dL — ABNORMAL HIGH (ref 70–99)
Glucose-Capillary: 144 mg/dL — ABNORMAL HIGH (ref 70–99)

## 2019-09-10 LAB — PROTIME-INR
INR: 2.1 — ABNORMAL HIGH (ref 0.8–1.2)
Prothrombin Time: 22.7 seconds — ABNORMAL HIGH (ref 11.4–15.2)

## 2019-09-10 LAB — HEPARIN LEVEL (UNFRACTIONATED): Heparin Unfractionated: 0.43 IU/mL (ref 0.30–0.70)

## 2019-09-10 LAB — SARS CORONAVIRUS 2 (TAT 6-24 HRS): SARS Coronavirus 2: NEGATIVE

## 2019-09-10 MED ORDER — WARFARIN SODIUM 4 MG PO TABS
4.0000 mg | ORAL_TABLET | Freq: Once | ORAL | Status: AC
  Administered 2019-09-10: 4 mg via ORAL
  Filled 2019-09-10: qty 1

## 2019-09-10 NOTE — Progress Notes (Signed)
Physical Therapy Session Note  Patient Details  Name: Roger Ramirez MRN: [id number] Date of Birth: August 11, 1945  Today's Date: 09/10/2019 PT Individual Time: [phone] and 1500-1526 PT Individual Time Calculation (min): 85 min and 26 min  Short Term Goals: Week 4:  PT Short Term Goal 1 (Week 4): = to LTGs based on [facility]  Skilled Therapeutic Interventions/Progress Updates:    Session 1: Pt sitting up in bed finishing breakfast upon therapist arrival, therapist provided supervision while pt finished eating. Pt agreeable to therapy tx and denies pain. Pt transferred to sitting EOB this session with min assist, cues to correct posterior lean. Pt seated EOB this session participated in upper/lower body dressing with assist to loop legs through pants, min assist to stand with RW while therapist assisted to pull pants over hips. Pt seated EOB while washing his face and underarms, also donned deodorant, all with supervision for sitting balance. Pt donned shirt with supervision. Stand pivot to w/c this morning with mod assist, cues for techniques/sequencing. Pt transported to the gym in w/c. Pt performed stand pivot to the nustep with RW and min assist, used nustep x 10 minutes on workload 5 for global strength and endurance. Pt ambulated x 30 ft to w/c with RW and min assist, cues for increased step length and upright posture. Pt transported to the gym. Pt ambulated x 50 ft to the mat with RW and min assist, continued cues for increased step length. Pt performed x 10 sit<>stands with RW and CGA-min assist, emphasis on set up with R UE on walker and pushing up from mat with L, also working on LE strengthening. Pt transferred to w/c with RW and min assist, emphasis on proper sequencing/technique. Pt ascended/descended 4 steps this session with B rails x 2 with min-mod assist, step to pattern, increased posterior lean when descending, working on strength and endurance, increased work of breathing noted. Pt worked on  hip strength and balance this session to perform sidestepping within parallel bars x 2 trials with min assist and cues for step length. Pt ambulated 2 x 60 ft this session with RW and min assist while also working on dual-task to color naming tasks, with this decreased step length and speed noted, slow responses. Pt transported back to room at end of session and left in w/c with needs in reach and chair alarm set, son present.   Session 2: Pt supine in bed upon PT arrival, agreeable to therapy tx and denies pain. Pt transferred to sitting EOB with min assist, mod assist for initial sit<>stand (improved to min assist/CGA throughout session for sit<>stand). Pt transferred to w/c stand pivot with RW and min assist, transported to gym. Pt ambulated 2 x 50 ft this session with RW and min assist, cues for step length and R foot clearance with turns to sit. Pt worked on standing balance this session while performing overhead reach for horseshoes and then tossing x 2 trials with CGA for balance and single UE support on RW. Transport present to take pt to CT scan, RN reports that patient fell this morning after therapy session, before lunch. Pt transported back to room and transferred to bed with min assist. Sit>supine min assist, pt left in care of patient transporter.    Therapy Documentation Precautions:  Precautions Precautions: Fall Precaution Comments: posterior and right lean in standing Restrictions Weight Bearing Restrictions: No    Therapy/Group: Individual Therapy  Cresenciano Genre, PT, DPT, CSRS 09/10/2019, 8:18 AM

## 2019-09-10 NOTE — Progress Notes (Signed)
[place] PHYSICAL MEDICINE & REHABILITATION PROGRESS NOTE   Subjective/Complaints:   No issues overnite  INR therapeutic , off heparin, discussed d/c in am with son and pt   ROS: Pt denies SOB, abd pain, CP, N/V/C/D,       Objective:   No results found. Recent Labs    09/09/19 0739 09/10/19 0611  WBC 7.2 8.0  HGB 13.3 13.7  HCT 40.4 41.5  PLT 221 220   No results for input(s): NA, K, CL, CO2, GLUCOSE, BUN, CREATININE, CALCIUM in the last 72 hours.  Intake/Output Summary (Last 24 hours) at 09/10/2019 0905 Last data filed at 09/10/2019 0500 Gross per 24 hour  Intake 266.83 ml  Output 275 ml  Net -8.17 ml     Physical Exam: Vital Signs Blood pressure (!) 148/72, pulse 64, temperature 98.1 F (36.7 C), resp. rate 18, height 5\' 9"  (1.753 m), weight 87.4 kg, SpO2 99 %.  General: No acute distress Mood and affect are appropriate Heart: Regular rate and rhythm no rubs murmurs or extra sounds Lungs: Clear to auscultation, breathing unlabored, no rales or wheezes Abdomen: Positive bowel sounds, soft nontender to palpation, nondistended Extremities: No clubbing, cyanosis, or edema Skin: No evidence of breakdown, no evidence of rash   . Cranial nerves II through XII intact, motor strength is 5/5 in Bilateral  deltoid, bicep, tricep, grip, 5/5 left and 4/5 Right hip flexor, knee extensors, ankle dorsiflexor and plantar flexor Functional mobility: posterior lean in standing Musculoskeletal: Full range of motion in all 4 extremities. No joint swelling  Assessment/Plan: 1. Functional deficits secondary to left ACA infarct which require 3+ hours per day of interdisciplinary therapy in a comprehensive inpatient rehab setting.  Physiatrist is providing close team supervision and 24 hour management of active medical problems listed below.  Physiatrist and rehab team continue to assess barriers to discharge/monitor patient progress toward functional and medical goals  Care  Tool:  Bathing  Bathing activity did not occur: Safety/medical concerns Body parts bathed by patient: Right arm, Left arm, Chest, Abdomen, Front perineal area, Right upper leg, Face, Left lower leg, Right lower leg, Left upper leg   Body parts bathed by helper: Buttocks Body parts n/a: Right arm, Left arm, Chest, Abdomen, Right upper leg, Left lower leg, Right lower leg, Left upper leg, Face(not attempted this session)   Bathing assist Assist Level: Minimal Assistance - Patient > 75%     Upper Body Dressing/Undressing Upper body dressing   What is the patient wearing?: Pull over shirt    Upper body assist Assist Level: Supervision/Verbal cueing    Lower Body Dressing/Undressing Lower body dressing      What is the patient wearing?: Incontinence brief, Pants     Lower body assist Assist for lower body dressing: Moderate Assistance - Patient 50 - 74%     Toileting Toileting    Toileting assist Assist for toileting: Moderate Assistance - Patient 50 - 74%     Transfers Chair/bed transfer  Transfers assist  Chair/bed transfer activity did not occur: Safety/medical concerns  Chair/bed transfer assist level: Minimal Assistance - Patient > 75% Chair/bed transfer assistive device: Programmer, multimedia   Ambulation assist   Ambulation activity did not occur: Safety/medical concerns  Assist level: Minimal Assistance - Patient > 75% Assistive device: Walker-rolling Max distance: 75ft   Walk 10 feet activity   Assist  Walk 10 feet activity did not occur: Safety/medical concerns  Assist level: Minimal Assistance - Patient > 75%  Assistive device: Walker-rolling   Walk 50 feet activity   Assist Walk 50 feet with 2 turns activity did not occur: Safety/medical concerns  Assist level: Moderate Assistance - Patient - 50 - 74% Assistive device: Walker-rolling    Walk 150 feet activity   Assist Walk 150 feet activity did not occur: Safety/medical  concerns         Walk 10 feet on uneven surface  activity   Assist Walk 10 feet on uneven surfaces activity did not occur: Safety/medical concerns         Wheelchair     Assist Will patient use wheelchair at discharge?: (TBD)             Wheelchair 50 feet with 2 turns activity    Assist            Wheelchair 150 feet activity     Assist          Blood pressure (!) 148/72, pulse 64, temperature 98.1 F (36.7 C), resp. rate 18, height 5\' 9"  (1.753 m), weight 87.4 kg, SpO2 99 %.  Medical Problem List and Plan: 1.  Right side weakness with dysarthria secondary to left ACA territory acute infarction as well as history of CVA 2020 with cognitive deficits Cognition and RLE weakness are main issues              -patient may shower Ready for D/C once off heparin drip   Continue CIR PT, OT, SLP reduced schedule, ? Exercise tolerance post MI- ECHO without sig loss of EF    5/22- Acute inferior MI based on EKG- Troponin 4.335; f/u troponin >27k- no    2.  Antithrombotics: -DVT/anticoagulation: Heparin Has  antiphospholipid antibody syndrome with history of DVT/PE.  Consultation with neuro as well as cardiology, currently on IV heparin, will ask pharmacy to consult on warfarin dosing with INR goal of 2.0-2.5. added Plavix 75 mg/day INR 2.1 d/c heparin 3. Pain Management: Tylenol as needed. Well controlled 4. Mood: Melatonin 3 mg nightly             -antipsychotic agents: N/A 5. Neuropsych: This patient is capable of making decisions on his own behalf. Poor concentration attention, initiation , , methylphenidate trial not helpful, have discontinued   6. Skin/Wound Care: Routine skin checks 7. Fluids/Electrolytes/Nutrition: Routine in and outs. CMP ordered. 8.  History of saddle pulmonary emboli.  Continue Eliquis 9.  Diabetes mellitus.  Hemoglobin A1c 6.1.  SSI.  Patient on Glucophage 1000 mg twice daily prior to admission.  Resume as needed CBG (last 3)   Recent Labs    09/09/19 1719 09/09/19 2058 09/10/19 0613  GLUCAP 140* 151* 94       5/28- BGs controlled- con't meds 10.  Hypertension.  Avapro 75 mg daily.           Vitals:   09/09/19 1935 09/10/19 0411  BP: 130/68 (!) 148/72  Pulse: 78 64  Resp: 18 18  Temp: 98.7 F (37.1 C) 98.1 F (36.7 C)  SpO2: 96% 99%   5/30- BP and pulse controlled  11.  Hypothyroidism. Continue Synthroid 12.  Hyperlipidemia. Lipitor 13.  History of tobacco abuse.  Counseling  14. Acute inferior MI- CP resolved - on IV heparin, hx APA, Neuro rec warfarin , Plavix added per cardiology  15. Discont.  Daily cbc  Hgb stable   LOS: 25 days A FACE TO FACE EVALUATION WAS PERFORMED  6/30 09/10/2019, 9:05 AM

## 2019-09-10 NOTE — Progress Notes (Addendum)
ANTICOAGULATION CONSULT NOTE - Follow Up Consult  Pharmacy Consult for Coumadin Indication: APLS/PE/hx CVA + new CVA  No Known Allergies  Patient Measurements: Height: 5\' 9"  (175.3 cm) Weight: 87.4 kg (192 lb 10.9 oz) IBW/kg (Calculated) : 70.7  Vital Signs: Temp: 98.1 F (36.7 C) (05/30 0411) BP: 148/72 (05/30 0411) Pulse Rate: 64 (05/30 0411)  Labs: Recent Labs    09/08/19 0735 09/09/19 0739 09/10/19 0611  HGB 14.1 13.3  --   HCT 42.8 40.4  --   PLT 225 221  --   LABPROT 17.6* 19.9* 22.7*  INR 1.5* 1.8* 2.1*  HEPARINUNFRC 0.44 0.48 0.43    Estimated Creatinine Clearance: 75.6 mL/min (by C-G formula based on SCr of 0.91 mg/dL).  Assessment:  Anticoag: Xarelto PTA for hx APLS/PE/hx CVA + new CVA.- INR goal 2-2.5. - Neuro wanted Pradaxa but insurance would not cover. - Started Eliquis 5/5-5/22. Edu done w/ dtr before CIR AVS in. D/c ASA. - 5/22: hold apix and start IV heparin for CP (Tropn 4.3>>27000) - 5/24: Start Coumadin.  - 5/27: HL 0.44 in goal (0.3-0.5). INR up to 1.5. CBC remains WNL   HL 0.43 in goal (0.3-0.5). INR increasing appropriately to 1.8 to 2.1. No bleeding or problems with infusion per RN.    Goal of Therapy:  INR 2-2.5 Monitor platelets by anticoagulation protocol: Yes   Plan:  - Stop IV heparin and daily heparin levels - Monitor CBC periodically - INR daily - Give Coumadin 4 mg po x1 - Watch for signs/symptoms of bleeding   01-13-2002, PharmD PGY1 Acute Care Pharmacy Resident 09/10/2019,7:12 AM

## 2019-09-10 NOTE — Significant Event (Signed)
   09/10/19 0836  Pain Assessment  Pain Scale 0-10  Pain Score 0  Musculoskeletal  Musculoskeletal (WDL) X  Assistive Device Stedy;Wheelchair  Generalized Weakness Yes  Weight Bearing Restrictions No  Integumentary  Integumentary (WDL) X (area on back of scalp red, no swelling)  Skin Color Appropriate for ethnicity  Skin Condition Dry  Moisture Associated Skin Damage Location Buttocks;Sacrum  Moisture Associated Skin Damage Orientation Right;Left  Moisture Associated Skin Damage Intervention Cleansed;Barrier cream

## 2019-09-11 ENCOUNTER — Inpatient Hospital Stay (HOSPITAL_COMMUNITY): Payer: Medicare Other | Admitting: Physical Therapy

## 2019-09-11 ENCOUNTER — Inpatient Hospital Stay (HOSPITAL_COMMUNITY): Payer: Medicare Other | Admitting: Occupational Therapy

## 2019-09-11 ENCOUNTER — Inpatient Hospital Stay (HOSPITAL_COMMUNITY): Payer: Medicare Other | Admitting: Speech Pathology

## 2019-09-11 LAB — PROTIME-INR
INR: 2.1 — ABNORMAL HIGH (ref 0.8–1.2)
Prothrombin Time: 23 seconds — ABNORMAL HIGH (ref 11.4–15.2)

## 2019-09-11 LAB — GLUCOSE, CAPILLARY: Glucose-Capillary: 115 mg/dL — ABNORMAL HIGH (ref 70–99)

## 2019-09-11 MED ORDER — WARFARIN SODIUM 4 MG PO TABS
4.0000 mg | ORAL_TABLET | Freq: Every day | ORAL | 11 refills | Status: DC
Start: 2019-09-11 — End: 2023-02-14

## 2019-09-11 MED ORDER — WARFARIN SODIUM 4 MG PO TABS: 4.0000 mg | ORAL_TABLET | Freq: Once | ORAL | Status: DC

## 2019-09-11 NOTE — Plan of Care (Signed)
  Problem: Consults Goal: RH STROKE PATIENT EDUCATION Description: See Patient Education module for education specifics  Outcome: Progressing   Problem: RH BOWEL ELIMINATION Goal: RH STG MANAGE BOWEL WITH ASSISTANCE Description: STG Manage Bowel with min Assistance. Outcome: Progressing Goal: RH STG MANAGE BOWEL W/MEDICATION W/ASSISTANCE Description: STG Manage Bowel with Medication with mod I Assistance. Outcome: Progressing   Problem: RH BLADDER ELIMINATION Goal: RH STG MANAGE BLADDER WITH ASSISTANCE Description: STG Manage Bladder With min Assistance Outcome: Progressing   Problem: RH SAFETY Goal: RH STG ADHERE TO SAFETY PRECAUTIONS W/ASSISTANCE/DEVICE Description: STG Adhere to Safety Precautions With cues/reminders Assistance/Device. Outcome: Progressing   Problem: RH COGNITION-NURSING Goal: RH STG USES MEMORY AIDS/STRATEGIES W/ASSIST TO PROBLEM SOLVE Description: STG Uses Memory Aids/Strategies With cues/reminders Assistance to Problem Solve. Outcome: Progressing   Problem: RH KNOWLEDGE DEFICIT Goal: RH STG INCREASE KNOWLEDGE OF HYPERTENSION Description: Pt/family able to demonstrate understanding of HTN management using diet control and medication compliance with booklets/handouts mod I assist prior to DC.  Outcome: Progressing Goal: RH STG INCREASE KNOWLEGDE OF HYPERLIPIDEMIA Description: Pt/family able to demonstrate understanding of HLD management using diet control and medication compliance with booklets/handouts mod I assist prior to DC.  Outcome: Progressing Goal: RH STG INCREASE KNOWLEDGE OF STROKE PROPHYLAXIS Description: Pt/family able to demonstrate understanding of stroke prevention management using diet control and medication compliance with booklets/handouts mod I assist prior to DC.  Outcome: Progressing

## 2019-09-11 NOTE — Plan of Care (Signed)
Problem: Consults Goal: RH STROKE PATIENT EDUCATION Description: See Patient Education module for education specifics  09/11/2019 1418 by Renda Rolls L, LPN Outcome: Completed/Met 09/11/2019 1417 by Renda Rolls L, LPN Outcome: Progressing   Problem: RH BOWEL ELIMINATION Goal: RH STG MANAGE BOWEL WITH ASSISTANCE Description: STG Manage Bowel with min Assistance. 09/11/2019 1418 by Renda Rolls L, LPN Outcome: Completed/Met 09/11/2019 1417 by Sanda Linger, LPN Outcome: Progressing Goal: RH STG MANAGE BOWEL W/MEDICATION W/ASSISTANCE Description: STG Manage Bowel with Medication with mod I Assistance. 09/11/2019 1418 by Renda Rolls L, LPN Outcome: Completed/Met 09/11/2019 1417 by Renda Rolls L, LPN Outcome: Progressing   Problem: RH BLADDER ELIMINATION Goal: RH STG MANAGE BLADDER WITH ASSISTANCE Description: STG Manage Bladder With min Assistance 09/11/2019 1418 by Renda Rolls L, LPN Outcome: Completed/Met 09/11/2019 1417 by Renda Rolls L, LPN Outcome: Progressing   Problem: RH SAFETY Goal: RH STG ADHERE TO SAFETY PRECAUTIONS W/ASSISTANCE/DEVICE Description: STG Adhere to Safety Precautions With cues/reminders Assistance/Device. 09/11/2019 1418 by Renda Rolls L, LPN Outcome: Completed/Met 09/11/2019 1417 by Renda Rolls L, LPN Outcome: Progressing   Problem: RH COGNITION-NURSING Goal: RH STG USES MEMORY AIDS/STRATEGIES W/ASSIST TO PROBLEM SOLVE Description: STG Uses Memory Aids/Strategies With cues/reminders Assistance to Problem Solve. 09/11/2019 1418 by Renda Rolls L, LPN Outcome: Completed/Met 09/11/2019 1417 by Renda Rolls L, LPN Outcome: Progressing   Problem: RH KNOWLEDGE DEFICIT Goal: RH STG INCREASE KNOWLEDGE OF HYPERTENSION Description: Pt/family able to demonstrate understanding of HTN management using diet control and medication compliance with booklets/handouts mod I assist prior to DC.  09/11/2019 1418 by Delma Freeze, Delana Manganello L, LPN Outcome: Completed/Met 09/11/2019  1417 by Renda Rolls L, LPN Outcome: Progressing Goal: RH STG INCREASE KNOWLEGDE OF HYPERLIPIDEMIA Description: Pt/family able to demonstrate understanding of HLD management using diet control and medication compliance with booklets/handouts mod I assist prior to DC.  09/11/2019 1418 by Delma Freeze, Leon Montoya L, LPN Outcome: Completed/Met 09/11/2019 1417 by Renda Rolls L, LPN Outcome: Progressing Goal: RH STG INCREASE KNOWLEDGE OF STROKE PROPHYLAXIS Description: Pt/family able to demonstrate understanding of stroke prevention management using diet control and medication compliance with booklets/handouts mod I assist prior to DC.  09/11/2019 1418 by Delma Freeze, Clytie Shetley L, LPN Outcome: Completed/Met 09/11/2019 1417 by Renda Rolls L, LPN Outcome: Progressing   Problem: Consults Goal: RH STROKE PATIENT EDUCATION Description: See Patient Education module for education specifics  09/11/2019 1418 by Renda Rolls L, LPN Outcome: Completed/Met 09/11/2019 1417 by Renda Rolls L, LPN Outcome: Progressing   Problem: RH BOWEL ELIMINATION Goal: RH STG MANAGE BOWEL WITH ASSISTANCE Description: STG Manage Bowel with min Assistance. 09/11/2019 1418 by Renda Rolls L, LPN Outcome: Completed/Met 09/11/2019 1417 by Sanda Linger, LPN Outcome: Progressing Goal: RH STG MANAGE BOWEL W/MEDICATION W/ASSISTANCE Description: STG Manage Bowel with Medication with mod I Assistance. 09/11/2019 1418 by Renda Rolls L, LPN Outcome: Completed/Met 09/11/2019 1417 by Renda Rolls L, LPN Outcome: Progressing   Problem: RH BLADDER ELIMINATION Goal: RH STG MANAGE BLADDER WITH ASSISTANCE Description: STG Manage Bladder With min Assistance 09/11/2019 1418 by Renda Rolls L, LPN Outcome: Completed/Met 09/11/2019 1417 by Renda Rolls L, LPN Outcome: Progressing   Problem: RH SAFETY Goal: RH STG ADHERE TO SAFETY PRECAUTIONS W/ASSISTANCE/DEVICE Description: STG Adhere to Safety Precautions With cues/reminders Assistance/Device. 09/11/2019  1418 by Renda Rolls L, LPN Outcome: Completed/Met 09/11/2019 1417 by Renda Rolls L, LPN Outcome: Progressing   Problem: RH COGNITION-NURSING Goal: RH STG USES MEMORY AIDS/STRATEGIES W/ASSIST TO PROBLEM SOLVE Description: STG Uses Memory Aids/Strategies With cues/reminders Assistance to Problem Solve. 09/11/2019  1418 by Renda Rolls L, LPN Outcome: Completed/Met 09/11/2019 1417 by Renda Rolls L, LPN Outcome: Progressing   Problem: RH KNOWLEDGE DEFICIT Goal: RH STG INCREASE KNOWLEDGE OF HYPERTENSION Description: Pt/family able to demonstrate understanding of HTN management using diet control and medication compliance with booklets/handouts mod I assist prior to DC.  09/11/2019 1418 by Delma Freeze, Tripton Ned L, LPN Outcome: Completed/Met 09/11/2019 1417 by Renda Rolls L, LPN Outcome: Progressing Goal: RH STG INCREASE KNOWLEGDE OF HYPERLIPIDEMIA Description: Pt/family able to demonstrate understanding of HLD management using diet control and medication compliance with booklets/handouts mod I assist prior to DC.  09/11/2019 1418 by Delma Freeze, Gearldean Lomanto L, LPN Outcome: Completed/Met 09/11/2019 1417 by Renda Rolls L, LPN Outcome: Progressing Goal: RH STG INCREASE KNOWLEDGE OF STROKE PROPHYLAXIS Description: Pt/family able to demonstrate understanding of stroke prevention management using diet control and medication compliance with booklets/handouts mod I assist prior to DC.  09/11/2019 1418 by Delma Freeze, Roby Spalla L, LPN Outcome: Completed/Met 09/11/2019 1417 by Renda Rolls L, LPN Outcome: Progressing

## 2019-09-11 NOTE — Progress Notes (Signed)
Speech Language Pathology Discharge Summary  Patient Details  Name: Roger Ramirez MRN: [id number] Date of Birth: September 30, 1945  Today's Date: 09/11/2019 SLP Individual Time: 0730-0755 SLP Individual Time Calculation (min): 25 min   Skilled Therapeutic Interventions:  Pt was seen for skilled ST targeting education pt and his granddaughter present at bedside. SLP facilitated functional conversation regarding ST goals and progress as well as follow up recommendations for ST at next venue of care. Pt's granddaughter reports she feels pt's speech, language, and cognition is back very near baseline. Discussed functional activities to target problem solving and recall pursued in therapy as well as difficulty with carryover of new learning. She did not have further questions about attention, memory, language, etc. She endorses recommendation for 24/7 supervision. Pt left sitting in bed with alarm set, needs within reach, granddaughter still present. Continue per current plan of care.      Patient has met 4 of 4 long term goals.  Patient to discharge at overall Mod;Max level.  Reasons goals not met: n/a   Clinical Impression/Discharge Summary:   Pt made functional gains and met 4 out of 4 long term goals this admission. Pt currently requires Mod-Max assist for basic tasks due to cognitive impairments impacting his short term memory (most severe), attention, awareness, and problem solving. He will require 24/7 supervision at discharge. Pt's speech and language skills have also improved throughout admission and he uses word finding strategies in functional conversation to express basic ideas, wants, and needs with Supervision A cues. Pt has demonstrated improved functional communication and basic familiar problem solving. However, given current deficits that result in limited carryover of new information/skills, decreased safety awareness, and overall daily functioning still present, recommend pt continue to  receive skilled ST services at SNF upon discharge. Would recommend pt's family receive more education regarding cognitive-linguistic strategies at SNF once close to discharge, as family opportunities were limited while in [place].   Care Partner:  Caregiver Able to Provide Assistance: No  Type of Caregiver Assistance: (pt transferring to SNF)  Recommendation:  24 hour supervision/assistance;Skilled Nursing facility  Rationale for SLP Follow Up: Maximize functional communication;Maximize cognitive function and independence;Reduce caregiver burden   Equipment: none   Reasons for discharge: Discharged from hospital   Patient/Family Agrees with Progress Made and Goals Achieved: Yes    Arbutus Leas 09/11/2019, 7:29 AM

## 2019-09-11 NOTE — Progress Notes (Signed)
Physical Therapy Discharge Summary  Patient Details  Name: Roger Ramirez MRN: [id number] Date of Birth: [birth year]  Today's Date: 09/11/2019 PT Individual Time: 0803-0901 PT Individual Time Calculation (min): 58 min    Patient has met 5 of 9 long term goals due to improved activity tolerance, improved balance, improved postural control, increased strength, ability to compensate for deficits, functional use of  right upper extremity and right lower extremity, improved attention, improved awareness and improved coordination.  Patient to discharge at an ambulatory level using RW with Min Assist and occasional mod assist.  Patient's care partner unavailable to provide the necessary physical and cognitive assistance at discharge.  Reasons goals not met: Pt's functional mobility level continues to fluctuate given time of day, fatigue, and attention to task therefore he can require up to mod assist for transfers and dynamic standing balance as well as requiring min assist for bed mobility.  Recommendation:  Patient will benefit from ongoing skilled PT services in skilled nursing facility setting to continue to advance safe functional mobility, address ongoing impairments in bed mobility, transfers, gait training, stair navigation, recall and carryover of training/education to increase independence with functional mobility tasks, dual-cognitive tasks, dynamic standing balance, and minimize fall risk.  Equipment: No equipment provided  Reasons for discharge: treatment goals met and discharge from hospital  Patient/family agrees with progress made and goals achieved: Yes  Skilled Therapeutic Interventions/Progress Updates:  Pt received supine in bed with his granddaughter present and pt agreeable to therapy session. Supine>sitting R EOB with CGA using bed features. R stand pivot to w/c using RW with min assist and continued cuing for increased R lateral step length. Transported in/out of bathroom.  Stand pivots w/c<>BSC over toilet using grab bar with CGA for steadying and cuing for R LE stepping. Standing using grab bar with CGA/close supervision performed LB clothing management with mod assist for time management. Incontinent of bladder in brief and continent of bowels on toilet. Seated hand hygiene at sink. RN in/out for morning medication. Pt's son arriving. Pt's family denies any questions/concerns.  Transported to/from gym in w/c for time management and energy conservation. Sit>stand w/c>RW with min cuing for carryover of proper sequencing of transfers (R hand on RW, push up on armrest with L hand, and lean head forward to front of RW) - required 2nd attempt prior to coming to stand due to posterior LOB back into w/c but able to complete with no more than min assist. Gait training 141f using RW with CGA primarily and min assist when turning for balance - demonstrates reciprocal stepping though decreased R LE foot clearance and step length compared to L. Ascended/descended 4steps using B HRs with min assist for balance primarily on descent due to slight posterior lean - step-to pattern leading with R LE each direction. Gait training ~111fup/down ramp using RW with pt demonstrating increased difficulty ambulating on unlevel ground due to increased cognitive demands to manage RW and increase R LE foot clearance. Transported back to room and left seated in w/c with needs in reach and seat belt alarm on.  PT Discharge Precautions/Restrictions Precautions Precautions: Fall Precaution Comments: posterior and right lean in standing Restrictions Weight Bearing Restrictions: No Pain Pain Assessment Pain Scale: 0-10 Pain Score: 0-No pain Perception  Perception Perception: Within Functional Limits Praxis Praxis: Impaired Praxis Impairment Details: Motor planning;Initiation  Cognition Overall Cognitive Status: Impaired/Different from baseline Arousal/Alertness: Awake/alert Orientation Level:  Oriented X4 Attention: Focused;Sustained Focused Attention: Appears intact Sustained Attention: Impaired Selective Attention: Impaired  Memory: Impaired Memory Impairment: Decreased recall of new information;Decreased short term memory Awareness: Impaired Initiating: Impaired Safety/Judgment: Impaired Comments: Pt still with decreased carryover and memory from session to session.  Decreased sustained and selective attention to tasks as well, as he will stop completing a task and then look at therapist for guidance.  Sensation  Sensation Light Touch: Appears Intact Hot/Cold: Not tested Proprioception: Impaired by gross assessment(decreased awareness of R LE location during functional tasks but may be due to impaired dual-task abilities) Stereognosis: Not tested Coordination Gross Motor Movements are Fluid and Coordinated: No Coordination and Movement Description: impaired due to R hemipareiss, poor trunk control, and impaired awareness Heel Shin Test: symmetrical in supine Motor  Motor Motor: Abnormal tone;Abnormal postural alignment and control Motor - Discharge Observations: Still with R LE hemiparesis and poor trunk control causing R posterior lean, but significantly improved since eval  Mobility Bed Mobility Bed Mobility: Supine to Sit;Sit to Supine Supine to Sit: Minimal Assistance - Patient > 75% Sit to Supine: Minimal Assistance - Patient > 75% Transfers Transfers: Sit to Stand;Stand to Sit;Stand Pivot Transfers Sit to Stand: Minimal Assistance - Patient > 75% Stand to Sit: Minimal Assistance - Patient > 75% Stand Pivot Transfers: Minimal Assistance - Patient > 75% Stand Pivot Transfer Details: Verbal cues for sequencing;Verbal cues for gait pattern;Verbal cues for technique;Verbal cues for safe use of DME/AE;Verbal cues for precautions/safety;Tactile cues for weight shifting;Tactile cues for sequencing;Tactile cues for posture;Tactile cues for initiation Transfer (Assistive  device): Rolling walker Locomotion  Gait Ambulation: Yes Gait Assistance: Minimal Assistance - Patient > 75% Gait Distance (Feet): 100 Feet Assistive device: Rolling walker Gait Assistance Details: Tactile cues for initiation;Tactile cues for weight shifting;Tactile cues for sequencing;Verbal cues for safe use of DME/AE;Verbal cues for gait pattern;Verbal cues for sequencing;Verbal cues for technique;Verbal cues for precautions/safety Gait Gait: Yes Gait Pattern: Impaired Gait Pattern: Step-through pattern;Decreased stance time - right;Decreased step length - right;Decreased stride length;Poor foot clearance - right Gait velocity: reduced Stairs / Additional Locomotion Stairs: Yes Stairs Assistance: Minimal Assistance - Patient > 75% Stair Management Technique: Two rails Number of Stairs: 4 Height of Stairs: 6 Ramp: Minimal Assistance - Patient >75% Wheelchair Mobility Wheelchair Mobility: No  Trunk/Postural Assessment  Cervical Assessment Cervical Assessment: Exceptions to WFL(forward head) Thoracic Assessment Thoracic Assessment: Exceptions to WFL(thoracic rounding with shoulder protraction) Lumbar Assessment Lumbar Assessment: Exceptions to WFL(posterior pelvic tilt in sitting) Postural Control Postural Control: Deficits on evaluation Trunk Control: now able to maintain static sitting EOB without UE support, supervision for safety Righting Reactions: delayed Postural Limitations: able to maintain standing balance with UE support and CGA/min assist demonstrating improved postural control  Balance Balance Balance Assessed: Yes Static Sitting Balance Static Sitting - Level of Assistance: 5: Stand by assistance Dynamic Sitting Balance Dynamic Sitting - Balance Support: No upper extremity supported;Feet supported;During functional activity Dynamic Sitting - Level of Assistance: 4: Min assist Static Standing Balance Static Standing - Balance Support: During functional  activity;Bilateral upper extremity supported Static Standing - Level of Assistance: 4: Min assist Dynamic Standing Balance Dynamic Standing - Balance Support: During functional activity;Bilateral upper extremity supported Dynamic Standing - Level of Assistance: 4: Min assist;3: Mod assist Extremity Assessment      RLE Assessment RLE Assessment: Exceptions to [facility] Passive Range of Motion (PROM) Comments: Limited hamstring length General Strength Comments: strength assessed in supine RLE Strength Right Hip Flexion: 3-/5 Right Knee Flexion: 3+/5 Right Knee Extension: 4-/5 Right Ankle Dorsiflexion: 4-/5 Right Ankle Plantar Flexion: 4-/5  LLE Assessment LLE Assessment: Exceptions to [facility] Active Range of Motion (AROM) Comments: WFL LLE Strength Left Hip Flexion: 4+/5 Left Knee Flexion: 5/5 Left Knee Extension: 5/5 Left Ankle Dorsiflexion: 5/5 Left Ankle Plantar Flexion: 5/5    Tawana Scale, PT, DPT 09/11/2019, 7:45 AM

## 2019-09-11 NOTE — Progress Notes (Signed)
Patient belongings were packed by family. Patient IV to right forearm removed and pressures bandage applied. Patient received all 8-10am  morning meds. [facility] transferred patient to [place] facility at [facility].

## 2019-09-11 NOTE — Progress Notes (Signed)
Patient ID: Roger Ramirez, male   DOB: Aug 01, 1945, 74 y.o.   MRN: [id number]  This SW covering for primary SW, Lavera Guise.  Pt to d/c today. [facility] ambulance pick up at 11:30am.   Cecile Sheerer, MSW, LCSWA Office: [phone] Cell: [phone] Fax: [phone]

## 2019-09-11 NOTE — Progress Notes (Signed)
ANTICOAGULATION CONSULT NOTE - Follow Up Consult  Pharmacy Consult for Coumadin Indication: APLS/PE/hx CVA + new CVA  No Known Allergies  Patient Measurements: Height: 5\' 9"  (175.3 cm) Weight: 87.4 kg (192 lb 10.9 oz) IBW/kg (Calculated) : 70.7  Vital Signs: Temp: 99.4 F (37.4 C) (05/31 0727) Temp Source: Oral (05/31 0727) BP: 134/57 (05/31 0727) Pulse Rate: 59 (05/31 0727)  Labs: Recent Labs    09/09/19 0739 09/10/19 0611 09/11/19 0509  HGB 13.3 13.7  --   HCT 40.4 41.5  --   PLT 221 220  --   LABPROT 19.9* 22.7* 23.0*  INR 1.8* 2.1* 2.1*  HEPARINUNFRC 0.48 0.43  --     Estimated Creatinine Clearance: 75.6 mL/min (by C-G formula based on SCr of 0.91 mg/dL).  Assessment:  Anticoag: Xarelto PTA for hx APLS/PE/hx CVA + new CVA.- INR goal 2-2.5. - Neuro wanted Pradaxa but insurance would not cover. - Started Eliquis 5/5-5/22. Edu done w/ dtr before CIR AVS in. D/c ASA. - 5/22: hold apix and start IV heparin for CP (Tropn 4.3>>27000) - 5/24: Start Coumadin.   Today INR 2.1 at goal. CBC wnl - Hgb 13.7, Plts 220  Goal of Therapy:  INR 2-2.5 Monitor platelets by anticoagulation protocol: Yes   Plan:  - Give Coumadin 4 mg po x1 - INR daily - Monitor CBC periodically - Watch for signs/symptoms of bleeding   6/24, PharmD PGY1 Ambulatory Care Resident [employer] # [phone]

## 2019-10-12 ENCOUNTER — Encounter: Payer: Medicare Other | Attending: Physical Medicine & Rehabilitation | Admitting: Physical Medicine & Rehabilitation

## 2019-10-12 ENCOUNTER — Other Ambulatory Visit: Payer: Self-pay

## 2019-10-12 ENCOUNTER — Encounter: Payer: Self-pay | Admitting: Physical Medicine & Rehabilitation

## 2019-10-12 VITALS — BP 135/75 | HR 65 | Temp 98.3°F | Wt 207.0 lb

## 2019-10-12 DIAGNOSIS — I63522 Cerebral infarction due to unspecified occlusion or stenosis of left anterior cerebral artery: Secondary | ICD-10-CM | POA: Diagnosis present

## 2019-10-12 NOTE — Progress Notes (Signed)
Subjective:    Patient ID: Roger Ramirez, male    DOB: 12/14/1945, 74 y.o.   MRN: [id number] 74 y.o. right-handed male with history of saddle pulmonary emboli from presumed antiphospholipid antibody syndrome maintained on Xarelto, tobacco abuse, hypertension, diabetes mellitus, CVA 2020 with reported cognitive deficits.  Patient lives alone reportedly independent prior to admission.  He has a son and daughter who check on him routinely.  Presented 08/10/2019 with right hemiparesis dysarthria and altered mental status.  Cranial CT scan unremarkable for acute changes.  MRI showed acute infarction affecting the left body of the corpus callosum.  CT angiogram of the head and neck with atherosclerotic irregularity of the left A3 ACA high-grade stenosis with suspected subsequent occlusion.  Echocardiogram with ejection fraction 65%.  Patient did not receive TPA.  Follow-up MRI showed progression left anterior cerebral artery infarction since the MRI of 08/10/2019 no associated hemorrhage.  Neurology follow-up initially on Eliquis for CVA prophylaxis  Admit date: 08/16/2019 Discharge date: 09/08/2019  HPI Still at [facility]  No pain or spasms  The patient's daughter is here with him today.  She is not sure exactly how much therapy patient is getting at [facility] but he is getting PT OT and per facility is 2 to 3 days/week. I reviewed his medications.  Patient is no longer on methylphenidate.  Pain Inventory Average Pain 0 Pain Right Now 0 My pain is no pain  In the last 24 hours, has pain interfered with the following? General activity 0 Relation with others 0 Enjoyment of life 0 What TIME of day is your pain at its worst? no pain Sleep (in general) Good  Pain is worse with: no pain Pain improves with: no pain Relief from Meds: no pain  Mobility walk with assistance use a walker how many minutes can you walk? 5-10 ability to climb steps?  yes do you drive?  no use a wheelchair needs help  with transfers  Function retired I need assistance with the following:  dressing, bathing, toileting, meal prep, household duties and shopping  Neuro/Psych bladder control problems bowel control problems trouble walking dizziness confusion  Prior Studies Any changes since last visit?  no  Physicians involved in your care Any changes since last visit?  no   Family History  Problem Relation Age of Onset  . Hypertension Mother   . Hypertension Father   . Dementia Maternal Grandmother   . Stroke Maternal Grandmother    Social History   Socioeconomic History  . Marital status: Divorced    Spouse name: Not on file  . Number of children: Not on file  . Years of education: Not on file  . Highest education level: Not on file  Occupational History  . Occupation: retired  Tobacco Use  . Smoking status: Former Smoker    Packs/day: 1.00    Years: 20.00    Pack years: 20.00    Types: Cigarettes  . Smokeless tobacco: Never Used  . Tobacco comment: Quit 1970's  Substance and Sexual Activity  . Alcohol use: Not Currently    Alcohol/week: 0.0 standard drinks    Comment: Occasional beer / 1 every 2 or 3 weeks  . Drug use: No  . Sexual activity: Not on file  Other Topics Concern  . 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 Difficulty of Paying Living Expenses:   Food Insecurity:   . Worried About Programme researcher, broadcasting/film/video in the Last Year:   . Barista in the Last Year:   Transportation Needs:   . Freight forwarder (Medical):   Marland Kitchen Lack of Transportation (Non-Medical):   Physical Activity:   . Days of Exercise per Week:   . Minutes of Exercise per Session:   Stress:   . Feeling of Stress :   Social Connections:   . Frequency of Communication with Friends and Family:   . Frequency of Social  Gatherings with Friends and Family:   . Attends Religious Services:   . Active Member of Clubs or Organizations:   . Attends Banker Meetings:   Marland Kitchen Marital Status:    No past surgical history on file. Past Medical History:  Diagnosis Date  . Antiphospholipid syndrome (HCC)   . Cognitive deficits   . CVA (cerebral vascular accident) (HCC) 2020   2020 and 07/2019  . Diabetes mellitus (HCC)   . Hypertension   . Hypothyroidism   . Moderate aortic stenosis   . Pulmonary embolism (HCC)   . Tobacco abuse    BP 135/75   Pulse 65   Temp 98.3 F (36.8 C)   Wt 207 lb (93.9 kg)   SpO2 95%   BMI 30.57 kg/m   Opioid Risk Score:   Fall Risk Score:  `1  Depression screen PHQ 2/9  Depression screen [facility] 2/9 10/12/2019 07/20/2018 07/19/2018 06/14/2018  Decreased Interest 3 0 0 0  Down, Depressed, Hopeless 2 0 0 0  PHQ - 2 Score 5 0 0 0  Altered sleeping 0 - - -  Tired, decreased energy 1 - - -  Change in appetite 0 - - -  Feeling bad or failure about yourself  1 - - -  Trouble concentrating 2 - - -  Moving slowly or fidgety/restless 0 - - -  Suicidal thoughts 0 - - -  PHQ-9 Score 9 - - -    Review of Systems  Constitutional: Negative.   HENT: Negative.   Eyes: Negative.   Respiratory: Negative.   Cardiovascular: Negative.   Gastrointestinal:       Bowel control  Endocrine: Negative.   Genitourinary:       Bladder control  Musculoskeletal: Positive for gait problem.  Skin: Negative.   Allergic/Immunologic: Negative.   Neurological: Positive for weakness.  Hematological: Bruises/bleeds easily.       Warfarin  Psychiatric/Behavioral: Positive for confusion and dysphoric mood.  All other systems reviewed and are negative.      Objective:   Physical Exam HENT:     Head: Normocephalic and atraumatic.  Eyes:     Extraocular Movements: Extraocular movements intact.     Conjunctiva/sclera: Conjunctivae normal.     Pupils: Pupils are equal, round, and reactive to  light.  Neurological:     Mental Status: He is alert. He is disoriented.     Cranial Nerves: No dysarthria or facial asymmetry.     Motor: No abnormal muscle tone.  Psychiatric:        Attention and Perception: He is inattentive.        Mood and Affect: Affect is blunt.        Speech: Speech is delayed.        Behavior: Behavior is slowed and withdrawn.  Cognition and Memory: Cognition is impaired.    Patient is oriented to person, "[facility] hospital", not time The patient does not recall meeting me despite daily visits in the hospital. Speech is without dysarthria or aphasia. Strength is 5/5 bilateral deltoid bicep tricep grip/5 in the right hip flexor knee extensor ankle dorsiflexor 5/5 in left hip flexor knee extensor ankle dorsiflexor Gait not tested but not of assistive device with him Sensation reported is equal bilateral upper and lower limbs      Assessment & Plan:  #1.  Left anterior cerebral artery infarct with right lower extremity weakness as well as cognitive deficits.  After speaking with the daughter the patient has had previous CVA in 2020 which also caused cognitive deficits and required full supervision.  We discussed the head periventricular white matter strokes that were noted in all 3 of his MRIs.  We discussed that it is unlikely that he will be able to live independently any more.  He may be able to live in a home setting with 24-hour assistance daughter is not sure that the can arrange this.  He will remain on Plavix for now.  He will continue PT OT. He has a follow-up visit with neurology scheduled October 24, 2019 with Dr. Pearlean Brownie Discussed with daughter that if he leaves the institutional setting, physical medicine rehab follow-up would be recommended.  We discussed that cognitive deficits post stroke can slowly improve over the course of a year but given the previous infarcts it is unlikely that he will proceed beyond a supervision level.

## 2019-10-12 NOTE — Patient Instructions (Signed)
Please call for followup appt if/when pt leaves skilled nsg facility

## 2019-10-24 ENCOUNTER — Ambulatory Visit (INDEPENDENT_AMBULATORY_CARE_PROVIDER_SITE_OTHER): Payer: Medicare Other | Admitting: Neurology

## 2019-10-24 ENCOUNTER — Encounter: Payer: Self-pay | Admitting: Neurology

## 2019-10-24 ENCOUNTER — Other Ambulatory Visit: Payer: Self-pay

## 2019-10-24 VITALS — BP 140/60 | HR 75 | Ht 71.0 in

## 2019-10-24 DIAGNOSIS — I63522 Cerebral infarction due to unspecified occlusion or stenosis of left anterior cerebral artery: Secondary | ICD-10-CM

## 2019-10-24 DIAGNOSIS — D6861 Antiphospholipid syndrome: Secondary | ICD-10-CM

## 2019-10-24 NOTE — Progress Notes (Signed)
[facility] [address] [place]. [place] [address] [phone]       OFFICE FOLLOW-UP NOTE  [name] Date of Birth:  1945-10-27 Medical Record Number:  [id number]   HPI: [name] is a 74-year Caucasian male seen today for initial office follow-up visit following hospital admission for stroke in April 2021.  Is accompanied by his son today.  History is obtained from them and review of electronic medical records and I personally reviewed available imaging films in PACS.  He has a past medical history of pulmonary embolism from antiphospholipid antibody syndrome who was previously on Xarelto.  He also had a left subcortical infarct in 2020, hypertension, hypothyroidism and diabetes who presented on 08/10/2019 to [facility] with a left anterior cerebral artery stroke with right-sided weakness.  His echocardiogram was unremarkable.  LDL cholesterol 57 mg percent.  Hemoglobin A1c was 6.1 CT angiogram showed atherosclerotic irregularity of the left anterior cerebral artery in the A3 segment with high-grade stenosis.  Calcified plaque at both ICA origins was noted with less than 50% stenosis.  He was changed to Eliquis and transferred to inpatient rehab where on 09/02/2019 he developed chest pain and found to have acute inferior myocardial infarction.  Plavix was added and Eliquis was changed to warfarin.  Cardiology decided on conservative medical management for his MI.  Patient is currently at [facility].  Is doing well he is obtained improvement in his strength and balance with her he still needs a walker.  He is able to ambulate with the therapist help.  He has had no further recurrent weakness or strokelike symptoms.  Is tolerating Plavix and warfarin well with only minor bruising and no bleeding.  His blood pressures well controlled today is borderline at 140/60 in office.  He is also on Lipitor which is tolerating well without muscle aches and  pains.  ROS:   14 system review of systems is positive for weakness, decreased hearing, gait difficulty, shortness of breath, imbalance, bruising and all other systems negative  PMH:  Past Medical History:  Diagnosis Date  . Antiphospholipid syndrome (HCC)   . Cognitive deficits   . CVA (cerebral vascular accident) (HCC) 2020   2020 and 07/2019  . Diabetes mellitus (HCC)   . Hypertension   . Hypothyroidism   . Moderate aortic stenosis   . Pulmonary embolism (HCC)   . Tobacco abuse     Social History:  Social History   Socioeconomic History  . Marital status: Divorced    Spouse name: Not on file  . Number of children: Not on file  . Years of education: Not on file  . Highest education level: Not on file  Occupational History  . Occupation: retired  Tobacco Use  . Smoking status: Former Smoker    Packs/day: 1.00    Years: 20.00    Pack years: 20.00    Types: Cigarettes  . Smokeless tobacco: Never Used  . Tobacco comment: Quit 1970's  Substance and Sexual Activity  . Alcohol use: Not Currently    Alcohol/week: 0.0 standard drinks    Comment: Occasional beer / 1 every 2 or 3 weeks  . Drug use: No  . Sexual activity: Not on file  Other Topics Concern  . 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 Difficulty of Paying Living Expenses:   Food Insecurity:   . Worried About Programme researcher, broadcasting/film/video in the Last Year:   . Barista in the Last Year:   Transportation Needs:   . Freight forwarder (Medical):   Marland Kitchen Lack of Transportation (Non-Medical):   Physical Activity:   . Days of Exercise per Week:   . Minutes of Exercise per Session:   Stress:   . Feeling of Stress :   Social Connections:   . Frequency of Communication with Friends and Family:   . Frequency of Social Gatherings with Friends  and Family:   . Attends Religious Services:   . Active Member of Clubs or Organizations:   . Attends Banker Meetings:   Marland Kitchen Marital Status:   Intimate Partner Violence:   . Fear of Current or Ex-Partner:   . Emotionally Abused:   Marland Kitchen Physically Abused:   . Sexually Abused:     Medications:   Current Outpatient Medications on File Prior to Visit  Medication Sig Dispense Refill  . acetaminophen (TYLENOL) 325 MG tablet Take 2 tablets (650 mg total) by mouth every 6 (six) hours as needed for mild pain (or Fever >/= 101).    Marland Kitchen atorvastatin (LIPITOR) 40 MG tablet Take 1 tablet (40 mg total) by mouth daily.    . clopidogrel (PLAVIX) 75 MG tablet Take 1 tablet (75 mg total) by mouth daily.    Marland Kitchen docusate sodium (COLACE) 100 MG capsule Take 1 capsule (100 mg total) by mouth 2 (two) times daily. 10 capsule 0  . HYDROcodone-acetaminophen (NORCO/VICODIN) 5-325 MG tablet Take 1-2 tablets by mouth every 4 (four) hours as needed for moderate pain. 3 tablet 0  . insulin aspart protamine - aspart (NOVOLOG 70/30 FLEXPEN RELION) (70-30) 100 UNIT/ML FlexPen Inject into the skin as needed.    Marland Kitchen levothyroxine (SYNTHROID) 75 MCG tablet Take 75 mcg by mouth daily.    . melatonin 3 MG TABS tablet Take 1 tablet (3 mg total) by mouth at bedtime.  0  . metoprolol succinate (TOPROL-XL) 25 MG 24 hr tablet Take 1 tablet (25 mg total) by mouth daily.    . Multiple Vitamin (MULTIVITAMIN WITH MINERALS) TABS tablet Take 1 tablet by mouth daily.    . nitroGLYCERIN (NITROSTAT) 0.4 MG SL tablet Place 1 tablet (0.4 mg total) under the tongue every 5 (five) minutes as needed for chest pain.  12  . polyethylene glycol (MIRALAX / GLYCOLAX) 17 g packet Take 17 g by mouth daily.    . vitamin B-12 1000 MCG tablet Take 1 tablet (1,000 mcg total) by mouth daily. 30 tablet 0  . warfarin (COUMADIN) 4 MG tablet Take 1 tablet (4 mg total) by mouth daily. 30 tablet 11   No current facility-administered medications on file prior  to visit.    Allergies:  No Known Allergies  Physical Exam General: well developed, well nourished elderly Caucasian male, seated, in no evident distress Head: head normocephalic and atraumatic.  Neck: supple with no carotid or supraclavicular bruits Cardiovascular: regular rate and rhythm, soft musical murmur heard over the precordium. Musculoskeletal: no deformity Skin:  no rash/petichiae Vascular:  Normal pulses all extremities Vitals:   10/24/19 1423  BP: 140/60  Pulse: 75   Neurologic Exam Mental Status: Awake and fully alert. Oriented to place and time. Recent and remote memory intact. Attention span, concentration and fund of knowledge appropriate. Mood and affect appropriate.  Cranial Nerves: Fundoscopic exam reveals sharp disc margins. Pupils equal, briskly reactive to light. Extraocular movements full without nystagmus. Visual fields full to confrontation. Hearing diminished bilaterally facial sensation intact.  Mild right lower facial asymmetry.  Tongue, palate moves normally and symmetrically.  Motor: Normal bulk and tone. Normal strength in all tested extremity muscles.  Diminished fine finger movements on the left.  Orbits right over left upper extremity. Sensory.: intact to touch ,pinprick .position and vibratory sensation.  Coordination: Rapid alternating movements normal in all extremities. Finger-to-nose and heel-to-shin performed accurately bilaterally. Gait and Station: Deferred as patient is in a wheelchair and did not bring his walker. Reflexes: 1+ and symmetric. Toes downgoing.   NIHSS  1 Modified Rankin  3   ASSESSMENT: 74 year old Caucasian male with left anterior cerebral artery infarct in April 2021 secondary to hypercoagulability from antiphospholipid antibody syndrome.  Prior history of left subcortical infarct in 2020 also.  Vascular risk factors of antiphospholipid antibody, hyperlipidemia, intracranial and extracranial atherosclerosis and  hypertension.    PLAN: I had a long d/w patient and his son about his recent stroke,hypercoagulability from antiphospholipid antibody syndrome, risk for recurrent stroke/TIAs, personally independently reviewed imaging studies and stroke evaluation results and answered questions.Continue warfarin daily  for secondary stroke prevention with target INR between 2 and 3 and maintain strict control of hypertension with blood pressure goal below 130/90, diabetes with hemoglobin A1c goal below 6.5% and lipids with LDL cholesterol goal below 70 mg/dL. I also advised the patient to eat a healthy diet with plenty of whole grains, cereals, fruits and vegetables, exercise regularly and maintain ideal body weight .continue ongoing physical and occupational therapy.  He was advised to use his walker at all times and to ambulate safely and follow safety precautions.  Followup in the future with my nurse practitioner Shanda Bumps in 6 months or call earlier if necessary. Greater than 50% of time during this 30 minute visit was spent on counseling,explanation of diagnosis stroke and hyper coagulative from antiphospholipid antibody, planning of further management, discussion with patient and family and coordination of care I   Delia Heady, MD 10/24/2019 3:06 PM  Note: This document was prepared with digital dictation and possible smart phrase technology. Any transcriptional errors that result from this process are unintentional

## 2019-10-24 NOTE — Patient Instructions (Signed)
I had a long d/w patient and his son about his recent stroke,hypercoagulability from antiphospholipid antibody syndrome, risk for recurrent stroke/TIAs, personally independently reviewed imaging studies and stroke evaluation results and answered questions.Continue warfarin daily  for secondary stroke prevention with target INR between 2 and 3 and maintain strict control of hypertension with blood pressure goal below 130/90, diabetes with hemoglobin A1c goal below 6.5% and lipids with LDL cholesterol goal below 70 mg/dL. I also advised the patient to eat a healthy diet with plenty of whole grains, cereals, fruits and vegetables, exercise regularly and maintain ideal body weight .continue ongoing physical and occupational therapy.  He was advised to use his walker at all times and to ambulate safely and follow safety precautions.  Followup in the future with my nurse practitioner Shanda Bumps in 6 months or call earlier if necessary.

## 2020-04-18 DIAGNOSIS — Z7901 Long term (current) use of anticoagulants: Secondary | ICD-10-CM | POA: Diagnosis not present

## 2020-04-23 DIAGNOSIS — I739 Peripheral vascular disease, unspecified: Secondary | ICD-10-CM | POA: Diagnosis not present

## 2020-04-23 DIAGNOSIS — L602 Onychogryphosis: Secondary | ICD-10-CM | POA: Diagnosis not present

## 2020-04-25 ENCOUNTER — Ambulatory Visit: Payer: Medicare Other | Admitting: Adult Health

## 2020-04-25 ENCOUNTER — Encounter: Payer: Self-pay | Admitting: Adult Health

## 2020-04-25 ENCOUNTER — Other Ambulatory Visit: Payer: Self-pay

## 2020-04-25 VITALS — BP 140/60 | HR 60 | Ht 69.0 in

## 2020-04-25 DIAGNOSIS — I63522 Cerebral infarction due to unspecified occlusion or stenosis of left anterior cerebral artery: Secondary | ICD-10-CM | POA: Diagnosis not present

## 2020-04-25 DIAGNOSIS — E119 Type 2 diabetes mellitus without complications: Secondary | ICD-10-CM

## 2020-04-25 DIAGNOSIS — D6861 Antiphospholipid syndrome: Secondary | ICD-10-CM | POA: Diagnosis not present

## 2020-04-25 DIAGNOSIS — I10 Essential (primary) hypertension: Secondary | ICD-10-CM

## 2020-04-25 DIAGNOSIS — Z8673 Personal history of transient ischemic attack (TIA), and cerebral infarction without residual deficits: Secondary | ICD-10-CM

## 2020-04-25 DIAGNOSIS — Z7901 Long term (current) use of anticoagulants: Secondary | ICD-10-CM | POA: Diagnosis not present

## 2020-04-25 DIAGNOSIS — E785 Hyperlipidemia, unspecified: Secondary | ICD-10-CM | POA: Diagnosis not present

## 2020-04-25 NOTE — Progress Notes (Signed)
I agree with the above plan 

## 2020-04-25 NOTE — Progress Notes (Signed)
[facility] [address] [place]. [place] [address] [phone]       STROKE FOLLOW-UP NOTE  [name] Date of Birth:  November 21, 1945 Medical Record Number:  [id number]    Reason for visit: Stroke follow-up   Chief Complaint  Patient presents with  . Follow-up    Rm 14 with son (mike) Pt is well     HPI:   Today, 04/25/2020, [name] returns for 24-month stroke follow-up accompanied by his son.  He continues to reside at [facility]. Stable since prior visit without new or worsening stroke/TIA symptoms.  Reports residual cognitive impairment with short-term memory impairment and balance which has been stable without worsening. Per son, he has not been participating in therapies and does not do much activity during the day.  He prefers to spend majority of his time in his bed and does not participate in activities offered by SNF.  He is able to stand/pivot for transfers but per son, he is not currently ambulating.  Remains on Plavix and warfarin with mild bruising but no bleeding.  INR levels monitored by facility.  Remains on atorvastatin 40 mg daily without myalgias.  Blood pressure today 140/60.  No further concerns at this time.    History provided for reference purposes only Initial visit 10/24/2019 Dr. Pearlean Ramirez: [name] is a 74-year Caucasian male seen today for initial office follow-up visit following hospital admission for stroke in April 2021.  Is accompanied by his son today.  History is obtained from them and review of electronic medical records and I personally reviewed available imaging films in PACS.  He has a past medical history of pulmonary embolism from antiphospholipid antibody syndrome who was previously on Xarelto.  He also had a left subcortical infarct in 2020, hypertension, hypothyroidism and diabetes who presented on 08/10/2019 to [facility] with a left anterior cerebral artery stroke with right-sided weakness.  His  echocardiogram was unremarkable.  LDL cholesterol 57 mg percent.  Hemoglobin A1c was 6.1 CT angiogram showed atherosclerotic irregularity of the left anterior cerebral artery in the A3 segment with high-grade stenosis.  Calcified plaque at both ICA origins was noted with less than 50% stenosis.  He was changed to Eliquis and transferred to inpatient rehab where on 09/02/2019 he developed chest pain and found to have acute inferior myocardial infarction.  Plavix was added and Eliquis was changed to warfarin.  Cardiology decided on conservative medical management for his MI.  Patient is currently at [facility].  Is doing well he is obtained improvement in his strength and balance with her he still needs a walker.  He is able to ambulate with the therapist help.  He has had no further recurrent weakness or strokelike symptoms.  Is tolerating Plavix and warfarin well with only minor bruising and no bleeding.  His blood pressures well controlled today is borderline at 140/60 in office.  He is also on Lipitor which is tolerating well without muscle aches and pains.  ROS:   14 system review of systems is positive for those listed in HPI and all other systems negative  PMH:  Past Medical History:  Diagnosis Date  . Antiphospholipid syndrome (HCC)   . Cognitive deficits   . CVA (cerebral vascular accident) (HCC) 2020   2020 and 07/2019  . Diabetes mellitus (HCC)   . Hypertension   . Hypothyroidism   . Moderate aortic stenosis   . Pulmonary embolism (HCC)   . Tobacco abuse  Social History:  Social History   Socioeconomic History  . Marital status: Divorced    Spouse name: Not on file  . Number of children: Not on file  . Years of education: Not on file  . Highest education level: Not on file  Occupational History  . Occupation: retired  Tobacco Use  . Smoking status: Former Smoker    Packs/day: 1.00    Years: 20.00    Pack years: 20.00    Types: Cigarettes  . Smokeless tobacco:  Never Used  . Tobacco comment: Quit 1970's  Substance and Sexual Activity  . Alcohol use: Not Currently    Alcohol/week: 0.0 standard drinks    Comment: Occasional beer / 1 every 2 or 3 weeks  . Drug use: No  . Sexual activity: Not on file  Other Topics Concern  . 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 acetaminophen (TYLENOL) 325 MG tablet Take 2 tablets (650 mg total) by mouth every 6 (six) hours as needed for mild pain (or Fever >/= 101).    Marland Kitchen atorvastatin (LIPITOR) 40 MG tablet Take 1 tablet (40 mg total) by mouth daily.    . clopidogrel (PLAVIX) 75 MG tablet Take 1 tablet (75 mg total) by mouth daily.    Marland Kitchen docusate sodium (COLACE) 100 MG capsule Take 1 capsule (100 mg total) by mouth 2 (two) times daily. 10 capsule 0  . insulin aspart protamine - aspart (NOVOLOG 70/30 FLEXPEN RELION) (70-30) 100 UNIT/ML FlexPen Inject into the skin as needed.    Marland Kitchen levothyroxine (SYNTHROID) 75 MCG tablet Take 75 mcg by mouth daily.    . melatonin 3 MG TABS tablet Take 1 tablet (3 mg total) by mouth at bedtime.  0  . metoprolol succinate (TOPROL-XL) 25 MG 24 hr tablet Take 1 tablet (25 mg total) by mouth daily.    . Multiple Vitamin (MULTIVITAMIN WITH MINERALS) TABS tablet Take 1 tablet by mouth daily.    . nitroGLYCERIN (NITROSTAT) 0.4 MG SL tablet Place 1 tablet (0.4 mg total) under the tongue every 5 (five) minutes as needed for chest pain.  12  . Omega-3 Fatty Acids (FISH OIL) 1000 MG CAPS     . polyethylene glycol  (MIRALAX / GLYCOLAX) 17 g packet Take 17 g by mouth daily.    . vitamin B-12 1000 MCG tablet Take 1 tablet (1,000 mcg total) by mouth daily. 30 tablet 0  . warfarin (COUMADIN) 4 MG tablet Take 1 tablet (4 mg total) by mouth daily. 30 tablet 11  . HYDROcodone-acetaminophen (NORCO/VICODIN) 5-325 MG tablet Take 1-2 tablets by mouth every 4 (four) hours as needed for moderate pain. (Patient not taking: Reported on 04/25/2020) 3 tablet 0  . metFORMIN (GLUCOPHAGE) 1000 MG tablet      No current facility-administered medications on file prior to visit.    Allergies:  No Known Allergies  Physical Exam Today's Vitals   04/25/20 0825  BP: 140/60  Pulse:  60  Height: 5\' 9"  (1.753 m)   Body mass index is 30.57 kg/m.   General: well developed, well nourished pleasant elderly Caucasian male, seated, in no evident distress Head: head normocephalic and atraumatic.  Neck: supple with no carotid or supraclavicular bruits Cardiovascular: regular rate and rhythm, soft musical murmur heard over the precordium. Musculoskeletal: no deformity Skin:  no rash/petichiae Vascular:  Normal pulses all extremities  Neurologic Exam Mental Status: Awake and fully alert.  Fluent speech and language.  Oriented to place and time. Recent and remote memory impaired. Attention span, concentration and fund of knowledge impaired. Mood and affect appropriate.  Cranial Nerves: Pupils equal, briskly reactive to light. Extraocular movements full without nystagmus. Visual fields full to confrontation. Hearing diminished bilaterally.  Facial sensation intact.  Mild right lower facial asymmetry.  Tongue, palate moves normally and symmetrically.  Motor: Normal bulk and tone. Normal strength in all tested extremity muscles.  Diminished fine finger movements on the left.   Sensory.: intact to touch ,pinprick .position and vibratory sensation.  Coordination: Rapid alternating movements normal in all extremities except slightly  decreased left hand. Finger-to-nose and heel-to-shin performed accurately bilaterally.  Orbits right arm over left arm. Gait and Station: Deferred  Reflexes: 1+ and symmetric. Toes downgoing.       ASSESSMENT/PLAN: 75 year old Caucasian male with left anterior cerebral artery infarct in April 2021 secondary to hypercoagulability from antiphospholipid antibody syndrome.  Prior history of left subcortical infarct in 2020 also.  Vascular risk factors of antiphospholipid antibody, hyperlipidemia, intracranial and extracranial atherosclerosis and hypertension.    1. L ACA stroke 2. L subcortical stroke a. Residual stroke deficit of decreased left hand dexterity, gait impairment and cognitive impairment.  i. Discussed cognitive impairment post stroke with his son and typical progression with additional strokes or uncontrolled risk factors.  Discussed importance of routine physical exercise and activity as well as memory exercises although patient does not routinely participate in activities at SNF -importance was discussed with patient and son plans on continued encouragement.  Due to time constraints, unable to further assess cognitive impairment but MMSE can be completed at follow-up visit b. Continue warfarin daily  and atorvastatin 40 mg daily for secondary stroke prevention.   c. Discussed secondary stroke prevention measures and importance of close PCP f/u for aggressive stroke risk factor management  3. Antiphospholipid antibody syndrome: On warfarin with INR goal 2-3 monitored by facility 4. HTN: BP goal <130/90.  Well-controlled on metoprolol per PCP 5. HLD: LDL goal <70.  Prior LDL 57.  On atorvastatin 40 mg daily per PCP 6. DMII: A1c goal <7.0.  Prior A1c 6.1.  On NovoLog per PCP    Follow-up in 6 months or call earlier if needed  CC:  [facility] provider: Dr. 2021, MD    I spent 30 minutes of face-to-face and non-face-to-face time with patient and son.  This included  previsit chart review, lab review, study review, order entry, electronic health record documentation, patient and son education and discussion regarding history of prior strokes, residual deficits, importance of routine physical activity, exercise and memory exercises, importance of managing stroke risk factors and answered all other questions to patient and son satisfaction   Clayborn Bigness, AGNP-BC  [facility] [address] [place], [place]  Phone [phone] Fax [phone] Note: This document was prepared with digital dictation and possible smart phrase technology. Any transcriptional errors that result from this process are unintentional.

## 2020-04-25 NOTE — Patient Instructions (Signed)
Request lots of encouragement to get patient out of bed daily and short distance ambulation as tolerated. Would recommend trial of PT to see if this helps with his motivation and able to ambulate again  Continue clopidogrel 75 mg daily and warfarin daily  and atorvastatin 40 mg daily for secondary stroke prevention  Continue to follow up with PCP regarding cholesterol and blood pressure management  Maintain strict control of hypertension with blood pressure goal below 130/90 and cholesterol with LDL cholesterol (bad cholesterol) goal below 70 mg/dL.     Followup in the future with me in 6 months or call earlier if needed     Thank you for coming to see [place] at [facility]. I hope we have been able to provide you high quality care today.  You may receive a patient satisfaction survey over the next few weeks. We would appreciate your feedback and comments so that we may continue to improve ourselves and the health of our patients.

## 2020-04-30 DIAGNOSIS — M6281 Muscle weakness (generalized): Secondary | ICD-10-CM | POA: Diagnosis not present

## 2020-04-30 DIAGNOSIS — R278 Other lack of coordination: Secondary | ICD-10-CM | POA: Diagnosis not present

## 2020-04-30 DIAGNOSIS — I639 Cerebral infarction, unspecified: Secondary | ICD-10-CM | POA: Diagnosis not present

## 2020-04-30 DIAGNOSIS — R2681 Unsteadiness on feet: Secondary | ICD-10-CM | POA: Diagnosis not present

## 2020-04-30 DIAGNOSIS — I219 Acute myocardial infarction, unspecified: Secondary | ICD-10-CM | POA: Diagnosis not present

## 2020-05-02 DIAGNOSIS — R278 Other lack of coordination: Secondary | ICD-10-CM | POA: Diagnosis not present

## 2020-05-02 DIAGNOSIS — I219 Acute myocardial infarction, unspecified: Secondary | ICD-10-CM | POA: Diagnosis not present

## 2020-05-02 DIAGNOSIS — R2681 Unsteadiness on feet: Secondary | ICD-10-CM | POA: Diagnosis not present

## 2020-05-02 DIAGNOSIS — I639 Cerebral infarction, unspecified: Secondary | ICD-10-CM | POA: Diagnosis not present

## 2020-05-02 DIAGNOSIS — M6281 Muscle weakness (generalized): Secondary | ICD-10-CM | POA: Diagnosis not present

## 2020-05-02 DIAGNOSIS — Z7901 Long term (current) use of anticoagulants: Secondary | ICD-10-CM | POA: Diagnosis not present

## 2020-05-06 DIAGNOSIS — M6281 Muscle weakness (generalized): Secondary | ICD-10-CM | POA: Diagnosis not present

## 2020-05-06 DIAGNOSIS — I219 Acute myocardial infarction, unspecified: Secondary | ICD-10-CM | POA: Diagnosis not present

## 2020-05-06 DIAGNOSIS — R2681 Unsteadiness on feet: Secondary | ICD-10-CM | POA: Diagnosis not present

## 2020-05-06 DIAGNOSIS — R278 Other lack of coordination: Secondary | ICD-10-CM | POA: Diagnosis not present

## 2020-05-06 DIAGNOSIS — I639 Cerebral infarction, unspecified: Secondary | ICD-10-CM | POA: Diagnosis not present

## 2020-05-07 DIAGNOSIS — R2681 Unsteadiness on feet: Secondary | ICD-10-CM | POA: Diagnosis not present

## 2020-05-07 DIAGNOSIS — M6281 Muscle weakness (generalized): Secondary | ICD-10-CM | POA: Diagnosis not present

## 2020-05-07 DIAGNOSIS — I219 Acute myocardial infarction, unspecified: Secondary | ICD-10-CM | POA: Diagnosis not present

## 2020-05-07 DIAGNOSIS — R278 Other lack of coordination: Secondary | ICD-10-CM | POA: Diagnosis not present

## 2020-05-07 DIAGNOSIS — I639 Cerebral infarction, unspecified: Secondary | ICD-10-CM | POA: Diagnosis not present

## 2020-05-09 DIAGNOSIS — R278 Other lack of coordination: Secondary | ICD-10-CM | POA: Diagnosis not present

## 2020-05-09 DIAGNOSIS — Z7901 Long term (current) use of anticoagulants: Secondary | ICD-10-CM | POA: Diagnosis not present

## 2020-05-09 DIAGNOSIS — I639 Cerebral infarction, unspecified: Secondary | ICD-10-CM | POA: Diagnosis not present

## 2020-05-09 DIAGNOSIS — I219 Acute myocardial infarction, unspecified: Secondary | ICD-10-CM | POA: Diagnosis not present

## 2020-05-09 DIAGNOSIS — M6281 Muscle weakness (generalized): Secondary | ICD-10-CM | POA: Diagnosis not present

## 2020-05-09 DIAGNOSIS — R2681 Unsteadiness on feet: Secondary | ICD-10-CM | POA: Diagnosis not present

## 2020-05-13 DIAGNOSIS — I219 Acute myocardial infarction, unspecified: Secondary | ICD-10-CM | POA: Diagnosis not present

## 2020-05-13 DIAGNOSIS — R278 Other lack of coordination: Secondary | ICD-10-CM | POA: Diagnosis not present

## 2020-05-13 DIAGNOSIS — I639 Cerebral infarction, unspecified: Secondary | ICD-10-CM | POA: Diagnosis not present

## 2020-05-13 DIAGNOSIS — M6281 Muscle weakness (generalized): Secondary | ICD-10-CM | POA: Diagnosis not present

## 2020-05-13 DIAGNOSIS — R2681 Unsteadiness on feet: Secondary | ICD-10-CM | POA: Diagnosis not present

## 2020-05-14 DIAGNOSIS — M6281 Muscle weakness (generalized): Secondary | ICD-10-CM | POA: Diagnosis not present

## 2020-05-14 DIAGNOSIS — I219 Acute myocardial infarction, unspecified: Secondary | ICD-10-CM | POA: Diagnosis not present

## 2020-05-14 DIAGNOSIS — R2681 Unsteadiness on feet: Secondary | ICD-10-CM | POA: Diagnosis not present

## 2020-05-14 DIAGNOSIS — R278 Other lack of coordination: Secondary | ICD-10-CM | POA: Diagnosis not present

## 2020-05-14 DIAGNOSIS — I639 Cerebral infarction, unspecified: Secondary | ICD-10-CM | POA: Diagnosis not present

## 2020-05-14 DIAGNOSIS — Z9181 History of falling: Secondary | ICD-10-CM | POA: Diagnosis not present

## 2020-05-16 DIAGNOSIS — M6281 Muscle weakness (generalized): Secondary | ICD-10-CM | POA: Diagnosis not present

## 2020-05-16 DIAGNOSIS — I219 Acute myocardial infarction, unspecified: Secondary | ICD-10-CM | POA: Diagnosis not present

## 2020-05-16 DIAGNOSIS — R278 Other lack of coordination: Secondary | ICD-10-CM | POA: Diagnosis not present

## 2020-05-16 DIAGNOSIS — I639 Cerebral infarction, unspecified: Secondary | ICD-10-CM | POA: Diagnosis not present

## 2020-05-16 DIAGNOSIS — Z7901 Long term (current) use of anticoagulants: Secondary | ICD-10-CM | POA: Diagnosis not present

## 2020-05-16 DIAGNOSIS — Z9181 History of falling: Secondary | ICD-10-CM | POA: Diagnosis not present

## 2020-05-16 DIAGNOSIS — R2681 Unsteadiness on feet: Secondary | ICD-10-CM | POA: Diagnosis not present

## 2020-05-20 DIAGNOSIS — Z9181 History of falling: Secondary | ICD-10-CM | POA: Diagnosis not present

## 2020-05-20 DIAGNOSIS — I639 Cerebral infarction, unspecified: Secondary | ICD-10-CM | POA: Diagnosis not present

## 2020-05-20 DIAGNOSIS — I219 Acute myocardial infarction, unspecified: Secondary | ICD-10-CM | POA: Diagnosis not present

## 2020-05-20 DIAGNOSIS — M6281 Muscle weakness (generalized): Secondary | ICD-10-CM | POA: Diagnosis not present

## 2020-05-20 DIAGNOSIS — R2681 Unsteadiness on feet: Secondary | ICD-10-CM | POA: Diagnosis not present

## 2020-05-20 DIAGNOSIS — R278 Other lack of coordination: Secondary | ICD-10-CM | POA: Diagnosis not present

## 2020-05-21 DIAGNOSIS — I639 Cerebral infarction, unspecified: Secondary | ICD-10-CM | POA: Diagnosis not present

## 2020-05-21 DIAGNOSIS — R278 Other lack of coordination: Secondary | ICD-10-CM | POA: Diagnosis not present

## 2020-05-21 DIAGNOSIS — I219 Acute myocardial infarction, unspecified: Secondary | ICD-10-CM | POA: Diagnosis not present

## 2020-05-21 DIAGNOSIS — R2681 Unsteadiness on feet: Secondary | ICD-10-CM | POA: Diagnosis not present

## 2020-05-21 DIAGNOSIS — M6281 Muscle weakness (generalized): Secondary | ICD-10-CM | POA: Diagnosis not present

## 2020-05-21 DIAGNOSIS — Z9181 History of falling: Secondary | ICD-10-CM | POA: Diagnosis not present

## 2020-05-23 DIAGNOSIS — M6281 Muscle weakness (generalized): Secondary | ICD-10-CM | POA: Diagnosis not present

## 2020-05-23 DIAGNOSIS — Z9181 History of falling: Secondary | ICD-10-CM | POA: Diagnosis not present

## 2020-05-23 DIAGNOSIS — I639 Cerebral infarction, unspecified: Secondary | ICD-10-CM | POA: Diagnosis not present

## 2020-05-23 DIAGNOSIS — R2681 Unsteadiness on feet: Secondary | ICD-10-CM | POA: Diagnosis not present

## 2020-05-23 DIAGNOSIS — I219 Acute myocardial infarction, unspecified: Secondary | ICD-10-CM | POA: Diagnosis not present

## 2020-05-23 DIAGNOSIS — R278 Other lack of coordination: Secondary | ICD-10-CM | POA: Diagnosis not present

## 2020-05-27 DIAGNOSIS — R2681 Unsteadiness on feet: Secondary | ICD-10-CM | POA: Diagnosis not present

## 2020-05-27 DIAGNOSIS — I639 Cerebral infarction, unspecified: Secondary | ICD-10-CM | POA: Diagnosis not present

## 2020-05-27 DIAGNOSIS — M6281 Muscle weakness (generalized): Secondary | ICD-10-CM | POA: Diagnosis not present

## 2020-05-27 DIAGNOSIS — R278 Other lack of coordination: Secondary | ICD-10-CM | POA: Diagnosis not present

## 2020-05-27 DIAGNOSIS — Z9181 History of falling: Secondary | ICD-10-CM | POA: Diagnosis not present

## 2020-05-27 DIAGNOSIS — I219 Acute myocardial infarction, unspecified: Secondary | ICD-10-CM | POA: Diagnosis not present

## 2020-05-28 DIAGNOSIS — Z9181 History of falling: Secondary | ICD-10-CM | POA: Diagnosis not present

## 2020-05-28 DIAGNOSIS — R2681 Unsteadiness on feet: Secondary | ICD-10-CM | POA: Diagnosis not present

## 2020-05-28 DIAGNOSIS — R278 Other lack of coordination: Secondary | ICD-10-CM | POA: Diagnosis not present

## 2020-05-28 DIAGNOSIS — I639 Cerebral infarction, unspecified: Secondary | ICD-10-CM | POA: Diagnosis not present

## 2020-05-28 DIAGNOSIS — I219 Acute myocardial infarction, unspecified: Secondary | ICD-10-CM | POA: Diagnosis not present

## 2020-05-28 DIAGNOSIS — M6281 Muscle weakness (generalized): Secondary | ICD-10-CM | POA: Diagnosis not present

## 2020-05-29 DIAGNOSIS — I219 Acute myocardial infarction, unspecified: Secondary | ICD-10-CM | POA: Diagnosis not present

## 2020-05-29 DIAGNOSIS — R278 Other lack of coordination: Secondary | ICD-10-CM | POA: Diagnosis not present

## 2020-05-29 DIAGNOSIS — I639 Cerebral infarction, unspecified: Secondary | ICD-10-CM | POA: Diagnosis not present

## 2020-05-29 DIAGNOSIS — R2681 Unsteadiness on feet: Secondary | ICD-10-CM | POA: Diagnosis not present

## 2020-05-29 DIAGNOSIS — M6281 Muscle weakness (generalized): Secondary | ICD-10-CM | POA: Diagnosis not present

## 2020-05-29 DIAGNOSIS — Z9181 History of falling: Secondary | ICD-10-CM | POA: Diagnosis not present

## 2020-05-30 DIAGNOSIS — Z7901 Long term (current) use of anticoagulants: Secondary | ICD-10-CM | POA: Diagnosis not present

## 2020-06-06 DIAGNOSIS — Z7901 Long term (current) use of anticoagulants: Secondary | ICD-10-CM | POA: Diagnosis not present

## 2020-06-13 DIAGNOSIS — Z7901 Long term (current) use of anticoagulants: Secondary | ICD-10-CM | POA: Diagnosis not present

## 2020-06-20 DIAGNOSIS — Z7901 Long term (current) use of anticoagulants: Secondary | ICD-10-CM | POA: Diagnosis not present

## 2020-06-25 DIAGNOSIS — Z79899 Other long term (current) drug therapy: Secondary | ICD-10-CM | POA: Diagnosis not present

## 2020-06-25 DIAGNOSIS — I4891 Unspecified atrial fibrillation: Secondary | ICD-10-CM | POA: Diagnosis not present

## 2020-06-25 DIAGNOSIS — Z7901 Long term (current) use of anticoagulants: Secondary | ICD-10-CM | POA: Diagnosis not present

## 2020-06-25 DIAGNOSIS — D519 Vitamin B12 deficiency anemia, unspecified: Secondary | ICD-10-CM | POA: Diagnosis not present

## 2020-06-25 DIAGNOSIS — I10 Essential (primary) hypertension: Secondary | ICD-10-CM | POA: Diagnosis not present

## 2020-06-25 DIAGNOSIS — D649 Anemia, unspecified: Secondary | ICD-10-CM | POA: Diagnosis not present

## 2020-07-04 DIAGNOSIS — Z7901 Long term (current) use of anticoagulants: Secondary | ICD-10-CM | POA: Diagnosis not present

## 2020-07-04 DIAGNOSIS — E1140 Type 2 diabetes mellitus with diabetic neuropathy, unspecified: Secondary | ICD-10-CM | POA: Diagnosis not present

## 2020-07-04 DIAGNOSIS — L602 Onychogryphosis: Secondary | ICD-10-CM | POA: Diagnosis not present

## 2020-07-11 DIAGNOSIS — Z7901 Long term (current) use of anticoagulants: Secondary | ICD-10-CM | POA: Diagnosis not present

## 2020-07-18 DIAGNOSIS — Z7901 Long term (current) use of anticoagulants: Secondary | ICD-10-CM | POA: Diagnosis not present

## 2020-08-01 DIAGNOSIS — Z7901 Long term (current) use of anticoagulants: Secondary | ICD-10-CM | POA: Diagnosis not present

## 2020-08-08 DIAGNOSIS — Z7901 Long term (current) use of anticoagulants: Secondary | ICD-10-CM | POA: Diagnosis not present

## 2020-08-22 DIAGNOSIS — Z7901 Long term (current) use of anticoagulants: Secondary | ICD-10-CM | POA: Diagnosis not present

## 2020-08-29 DIAGNOSIS — Z7901 Long term (current) use of anticoagulants: Secondary | ICD-10-CM | POA: Diagnosis not present

## 2020-09-05 DIAGNOSIS — Z7901 Long term (current) use of anticoagulants: Secondary | ICD-10-CM | POA: Diagnosis not present

## 2020-09-12 DIAGNOSIS — Z7901 Long term (current) use of anticoagulants: Secondary | ICD-10-CM | POA: Diagnosis not present

## 2020-09-16 DIAGNOSIS — E1140 Type 2 diabetes mellitus with diabetic neuropathy, unspecified: Secondary | ICD-10-CM | POA: Diagnosis not present

## 2020-09-16 DIAGNOSIS — L602 Onychogryphosis: Secondary | ICD-10-CM | POA: Diagnosis not present

## 2020-09-26 DIAGNOSIS — Z7901 Long term (current) use of anticoagulants: Secondary | ICD-10-CM | POA: Diagnosis not present

## 2020-10-03 DIAGNOSIS — Z7901 Long term (current) use of anticoagulants: Secondary | ICD-10-CM | POA: Diagnosis not present

## 2020-10-10 DIAGNOSIS — Z7901 Long term (current) use of anticoagulants: Secondary | ICD-10-CM | POA: Diagnosis not present

## 2020-10-17 DIAGNOSIS — Z7901 Long term (current) use of anticoagulants: Secondary | ICD-10-CM | POA: Diagnosis not present

## 2020-10-24 ENCOUNTER — Ambulatory Visit: Payer: Medicare Other | Admitting: Adult Health

## 2020-10-31 DIAGNOSIS — Z8673 Personal history of transient ischemic attack (TIA), and cerebral infarction without residual deficits: Secondary | ICD-10-CM | POA: Diagnosis not present

## 2020-10-31 DIAGNOSIS — I639 Cerebral infarction, unspecified: Secondary | ICD-10-CM | POA: Diagnosis not present

## 2020-11-05 ENCOUNTER — Ambulatory Visit: Payer: Medicare Other | Admitting: Adult Health

## 2020-11-07 DIAGNOSIS — I639 Cerebral infarction, unspecified: Secondary | ICD-10-CM | POA: Diagnosis not present

## 2020-11-14 DIAGNOSIS — Z7901 Long term (current) use of anticoagulants: Secondary | ICD-10-CM | POA: Diagnosis not present

## 2020-11-19 ENCOUNTER — Ambulatory Visit (INDEPENDENT_AMBULATORY_CARE_PROVIDER_SITE_OTHER): Payer: Medicare Other | Admitting: Adult Health

## 2020-11-19 VITALS — BP 122/80 | HR 64

## 2020-11-19 DIAGNOSIS — E785 Hyperlipidemia, unspecified: Secondary | ICD-10-CM

## 2020-11-19 DIAGNOSIS — I63522 Cerebral infarction due to unspecified occlusion or stenosis of left anterior cerebral artery: Secondary | ICD-10-CM | POA: Diagnosis not present

## 2020-11-19 DIAGNOSIS — D6861 Antiphospholipid syndrome: Secondary | ICD-10-CM | POA: Diagnosis not present

## 2020-11-19 DIAGNOSIS — Z8673 Personal history of transient ischemic attack (TIA), and cerebral infarction without residual deficits: Secondary | ICD-10-CM

## 2020-11-19 DIAGNOSIS — I10 Essential (primary) hypertension: Secondary | ICD-10-CM | POA: Diagnosis not present

## 2020-11-19 DIAGNOSIS — E119 Type 2 diabetes mellitus without complications: Secondary | ICD-10-CM | POA: Diagnosis not present

## 2020-11-19 NOTE — Patient Instructions (Addendum)
Continue warfarin daily and plavix and atorvastatin  for secondary stroke prevention   Continue to follow up with PCP regarding cholesterol and blood pressure management  Maintain strict control of hypertension with blood pressure goal below 130/90 and cholesterol with LDL cholesterol (bad cholesterol) goal below 70 mg/dL.   May consider restorative therapies to help maintain current level of functioning   Please ensure monitoring for depressive type symptoms and possibly consider starting medication if needed      Followup in the future with me in 6 months or call earlier if needed       Thank you for coming to see [place] at [facility]. I hope we have been able to provide you high quality care today.  You may receive a patient satisfaction survey over the next few weeks. We would appreciate your feedback and comments so that we may continue to improve ourselves and the health of our patients.

## 2020-11-19 NOTE — Progress Notes (Signed)
[facility] [address] [place]. [place] [address] [phone]       STROKE FOLLOW-UP NOTE  [name] Date of Birth:  05-27-1945 Medical Record Number:  [id number]    Reason for visit: Stroke follow-up   Chief Complaint  Patient presents with   Follow-up    Rm 6      HPI:   Today, 11/19/2020, Roger Ramirez is being seen for 6 month stroke follow up accompanied by his daughter. He continues to reside at [facility].  Overall stable since prior visit.  Residual cognitive impairment and gait impairment relatively stable without worsening.  Has good days and bad days and at times increased confusion towards the evening.  His day primarily consists of watching TV and is minimal to no brain exercises.  He transfers via wheelchair and is able to stand/pivot with assistance.  Denies new stroke/TIA symptoms.  Remains on warfarin and Plavix as well as atorvastatin per review of MAR.  Blood pressure today 122/80.  No further concerns at this time.    History provided for reference puroses only Update 04/25/2020 JM: Roger Ramirez returns for 7-month stroke follow-up accompanied by his son.  He continues to reside at [facility]. Stable since prior visit without new or worsening stroke/TIA symptoms.  Reports residual cognitive impairment with short-term memory impairment and balance which has been stable without worsening. Per son, he has not been participating in therapies and does not do much activity during the day.  He prefers to spend majority of his time in his bed and does not participate in activities offered by SNF.  He is able to stand/pivot for transfers but per son, he is not currently ambulating.  Remains on Plavix and warfarin with mild bruising but no bleeding.  INR levels monitored by facility.  Remains on atorvastatin 40 mg daily without myalgias.  Blood pressure today 140/60.  No further concerns at this time.  Initial visit 10/24/2019 Dr. Pearlean Brownie: Roger Ramirez is  a 74-year Caucasian male seen today for initial office follow-up visit following hospital admission for stroke in April 2021.  Is accompanied by his son today.  History is obtained from them and review of electronic medical records and I personally reviewed available imaging films in PACS.  He has a past medical history of pulmonary embolism from antiphospholipid antibody syndrome who was previously on Xarelto.  He also had a left subcortical infarct in 2020, hypertension, hypothyroidism and diabetes who presented on 08/10/2019 to [facility] with a left anterior cerebral artery stroke with right-sided weakness.  His echocardiogram was unremarkable.  LDL cholesterol 57 mg percent.  Hemoglobin A1c was 6.1 CT angiogram showed atherosclerotic irregularity of the left anterior cerebral artery in the A3 segment with high-grade stenosis.  Calcified plaque at both ICA origins was noted with less than 50% stenosis.  He was changed to Eliquis and transferred to inpatient rehab where on 09/02/2019 he developed chest pain and found to have acute inferior myocardial infarction.  Plavix was added and Eliquis was changed to warfarin.  Cardiology decided on conservative medical management for his MI.  Patient is currently at [facility].  Is doing well he is obtained improvement in his strength and balance with her he still needs a walker.  He is able to ambulate with the therapist help.  He has had no further recurrent weakness or strokelike symptoms.  Is tolerating Plavix and warfarin well with only minor bruising and no bleeding.  His  blood pressures well controlled today is borderline at 140/60 in office.  He is also on Lipitor which is tolerating well without muscle aches and pains.  ROS:   14 system review of systems is positive for those listed in HPI and all other systems negative  PMH:  Past Medical History:  Diagnosis Date   Antiphospholipid syndrome (HCC)    Cognitive deficits    CVA (cerebral  vascular accident) (HCC) 2020   2020 and 07/2019   Diabetes mellitus (HCC)    Hypertension    Hypothyroidism    Moderate aortic stenosis    Pulmonary embolism (HCC)    Tobacco abuse     Social History:  Social History   Socioeconomic History   Marital status: Divorced    Spouse name: Not on file   Number of children: Not on file   Years of education: Not on file   Highest education level: Not on file  Occupational History   Occupation: retired  Tobacco Use   Smoking status: Former    Packs/day: 1.00    Years: 20.00    Pack years: 20.00    Types: Cigarettes   Smokeless tobacco: Never   Tobacco comments:    Quit 1970's  Substance and Sexual Activity   Alcohol use: Not Currently    Alcohol/week: 0.0 standard drinks    Comment: Occasional beer / 1 every 2 or 3 weeks   Drug use: No   Sexual activity: Not on file  Other Topics Concern   Not on file  Social History Narrative   Owned a Scientist, 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     clopidogrel (PLAVIX) 75 MG tablet Take 1 tablet (75 mg total) by mouth daily.     docusate sodium (COLACE) 100 MG capsule Take 1 capsule (100 mg total) by mouth 2 (two) times daily. 10 capsule 0   insulin lispro (HUMALOG) 100 UNIT/ML KwikPen Inject into the skin. Sliding Scale     levothyroxine (SYNTHROID) 75 MCG tablet Take 75 mcg by mouth daily.     melatonin 3 MG TABS tablet Take 1 tablet (3 mg total) by mouth at bedtime.  0   metFORMIN  (GLUCOPHAGE-XR) 500 MG 24 hr tablet Take 500 mg by mouth daily.     metoprolol succinate (TOPROL-XL) 25 MG 24 hr tablet Take 1 tablet (25 mg total) by mouth daily.     Multiple Vitamin (MULTIVITAMIN WITH MINERALS) TABS tablet Take 1 tablet by mouth daily.     nitroGLYCERIN (NITROSTAT) 0.4 MG SL tablet Place 1 tablet (0.4 mg total) under the tongue every 5 (five) minutes as needed for chest pain.  12   polyethylene glycol (MIRALAX / GLYCOLAX) 17 g packet Take 17 g by mouth daily.     vitamin B-12 1000 MCG tablet Take 1 tablet (1,000 mcg total) by mouth daily. 30 tablet 0   warfarin (COUMADIN) 4 MG tablet Take 1 tablet (  4 mg total) by mouth daily. 30 tablet 11   No current facility-administered medications on file prior to visit.    Allergies:  No Known Allergies  Physical Exam Today's Vitals   11/19/20 1538  BP: 122/80  Pulse: 64  SpO2: 90%    There is no height or weight on file to calculate BMI.   General: well developed, well nourished pleasant elderly Caucasian male, seated, in no evident distress Head: head normocephalic and atraumatic.  Neck: supple with no carotid or supraclavicular bruits Cardiovascular: regular rate and rhythm Musculoskeletal: no deformity Skin:  no rash/petichiae Vascular:  Normal pulses all extremities  Neurologic Exam Mental Status: Awake and fully alert.  Fluent speech and language.  Disoriented to month and age. Recent and remote memory impaired. Attention span, concentration and fund of knowledge impaired. Mood and affect appropriate.  Cranial Nerves: Pupils equal, briskly reactive to light. Extraocular movements full without nystagmus. Visual fields full to confrontation. Hearing diminished bilaterally.  Facial sensation intact.  Tongue, palate moves normally and symmetrically.  Motor: Normal bulk and tone. Normal strength in all tested extremity muscles.  Diminished fine finger movements on the left.   Sensory.: intact to touch ,pinprick .position  and vibratory sensation.  Coordination: Rapid alternating movements normal in all extremities except slightly decreased left hand. Finger-to-nose and heel-to-shin performed accurately bilaterally.  Orbits right arm over left arm. Gait and Station: Deferred  Reflexes: 1+ and symmetric. Toes downgoing.       ASSESSMENT/PLAN: 76 year old Caucasian male with left anterior cerebral artery infarct in April 2021 secondary to hypercoagulability from antiphospholipid antibody syndrome.  Prior history of left subcortical infarct in 2020 also.  Vascular risk factors of antiphospholipid antibody, hyperlipidemia, MI 08/2019, intracranial and extracranial atherosclerosis and hypertension.    L ACA stroke L subcortical stroke Residual stroke deficit of decreased left hand dexterity, gait impairment and cognitive impairment.  Discussed cognitive impairment post stroke with his daughter and typical progression with additional strokes or uncontrolled risk factors as well as lack of routine activity or mental stimulation.  Possible underlying depression contributing and request facility to continue to monitor Continue warfarin daily, Plavix and atorvastatin 40 mg daily for secondary stroke prevention.   Discussed secondary stroke prevention measures and importance of close PCP f/u for aggressive stroke risk factor management - routine lab work completed at facility - unable to personally view via epic Antiphospholipid antibody syndrome: On warfarin with INR goal 2-3 monitored by facility HTN: BP goal <130/90.  Well-controlled on metoprolol per PCP HLD: LDL goal <70.   On atorvastatin 40 mg daily per PCP/facility DMII: A1c goal <7.0.  On Humalog and metformin per PCP/facility    Follow-up in 6 months or call earlier if needed  CC:  [facility] provider: Dr. Clayborn Bigness, MD    I spent 36 minutes of face-to-face and non-face-to-face time with patient and daughter.  This included previsit chart review, lab  review, study review, electronic health record documentation, patient and daughter education and discussion regarding history of prior strokes, residual deficits, importance of routine physical activity, exercise and memory exercises, secondary stroke prevention measures and importance of managing stroke risk factors and answered all other questions to patient and daughters satisfaction   Ihor Austin, [facility]  [facility] [address] [place], [place] [address]  Phone [phone] Fax [phone] Note: This document was prepared with digital dictation and possible smart phrase technology. Any transcriptional errors that result from this process are unintentional.

## 2020-11-21 ENCOUNTER — Encounter: Payer: Self-pay | Admitting: Adult Health

## 2020-11-21 DIAGNOSIS — Z7901 Long term (current) use of anticoagulants: Secondary | ICD-10-CM | POA: Diagnosis not present

## 2020-11-21 DIAGNOSIS — Z8673 Personal history of transient ischemic attack (TIA), and cerebral infarction without residual deficits: Secondary | ICD-10-CM | POA: Diagnosis not present

## 2020-12-05 DIAGNOSIS — I4891 Unspecified atrial fibrillation: Secondary | ICD-10-CM | POA: Diagnosis not present

## 2020-12-05 DIAGNOSIS — Z7901 Long term (current) use of anticoagulants: Secondary | ICD-10-CM | POA: Diagnosis not present

## 2020-12-05 DIAGNOSIS — I639 Cerebral infarction, unspecified: Secondary | ICD-10-CM | POA: Diagnosis not present

## 2020-12-19 DIAGNOSIS — Z7901 Long term (current) use of anticoagulants: Secondary | ICD-10-CM | POA: Diagnosis not present

## 2021-01-02 DIAGNOSIS — E039 Hypothyroidism, unspecified: Secondary | ICD-10-CM | POA: Diagnosis not present

## 2021-01-02 DIAGNOSIS — I4891 Unspecified atrial fibrillation: Secondary | ICD-10-CM | POA: Diagnosis not present

## 2021-01-02 DIAGNOSIS — Z8673 Personal history of transient ischemic attack (TIA), and cerebral infarction without residual deficits: Secondary | ICD-10-CM | POA: Diagnosis not present

## 2021-01-02 DIAGNOSIS — I10 Essential (primary) hypertension: Secondary | ICD-10-CM | POA: Diagnosis not present

## 2021-01-02 DIAGNOSIS — I639 Cerebral infarction, unspecified: Secondary | ICD-10-CM | POA: Diagnosis not present

## 2021-01-08 DIAGNOSIS — I639 Cerebral infarction, unspecified: Secondary | ICD-10-CM | POA: Diagnosis not present

## 2021-01-08 DIAGNOSIS — R2681 Unsteadiness on feet: Secondary | ICD-10-CM | POA: Diagnosis not present

## 2021-01-08 DIAGNOSIS — M6281 Muscle weakness (generalized): Secondary | ICD-10-CM | POA: Diagnosis not present

## 2021-01-08 DIAGNOSIS — R279 Unspecified lack of coordination: Secondary | ICD-10-CM | POA: Diagnosis not present

## 2021-01-08 DIAGNOSIS — Z9181 History of falling: Secondary | ICD-10-CM | POA: Diagnosis not present

## 2021-01-08 DIAGNOSIS — I219 Acute myocardial infarction, unspecified: Secondary | ICD-10-CM | POA: Diagnosis not present

## 2021-01-09 DIAGNOSIS — M6281 Muscle weakness (generalized): Secondary | ICD-10-CM | POA: Diagnosis not present

## 2021-01-09 DIAGNOSIS — R2681 Unsteadiness on feet: Secondary | ICD-10-CM | POA: Diagnosis not present

## 2021-01-09 DIAGNOSIS — Z9181 History of falling: Secondary | ICD-10-CM | POA: Diagnosis not present

## 2021-01-09 DIAGNOSIS — I219 Acute myocardial infarction, unspecified: Secondary | ICD-10-CM | POA: Diagnosis not present

## 2021-01-09 DIAGNOSIS — I639 Cerebral infarction, unspecified: Secondary | ICD-10-CM | POA: Diagnosis not present

## 2021-01-09 DIAGNOSIS — R279 Unspecified lack of coordination: Secondary | ICD-10-CM | POA: Diagnosis not present

## 2021-01-10 DIAGNOSIS — R279 Unspecified lack of coordination: Secondary | ICD-10-CM | POA: Diagnosis not present

## 2021-01-10 DIAGNOSIS — Z9181 History of falling: Secondary | ICD-10-CM | POA: Diagnosis not present

## 2021-01-10 DIAGNOSIS — R2681 Unsteadiness on feet: Secondary | ICD-10-CM | POA: Diagnosis not present

## 2021-01-10 DIAGNOSIS — I639 Cerebral infarction, unspecified: Secondary | ICD-10-CM | POA: Diagnosis not present

## 2021-01-10 DIAGNOSIS — I219 Acute myocardial infarction, unspecified: Secondary | ICD-10-CM | POA: Diagnosis not present

## 2021-01-10 DIAGNOSIS — M6281 Muscle weakness (generalized): Secondary | ICD-10-CM | POA: Diagnosis not present

## 2021-01-13 DIAGNOSIS — I219 Acute myocardial infarction, unspecified: Secondary | ICD-10-CM | POA: Diagnosis not present

## 2021-01-13 DIAGNOSIS — Z9181 History of falling: Secondary | ICD-10-CM | POA: Diagnosis not present

## 2021-01-13 DIAGNOSIS — I639 Cerebral infarction, unspecified: Secondary | ICD-10-CM | POA: Diagnosis not present

## 2021-01-13 DIAGNOSIS — R2681 Unsteadiness on feet: Secondary | ICD-10-CM | POA: Diagnosis not present

## 2021-01-13 DIAGNOSIS — R279 Unspecified lack of coordination: Secondary | ICD-10-CM | POA: Diagnosis not present

## 2021-01-13 DIAGNOSIS — M6281 Muscle weakness (generalized): Secondary | ICD-10-CM | POA: Diagnosis not present

## 2021-01-15 DIAGNOSIS — R279 Unspecified lack of coordination: Secondary | ICD-10-CM | POA: Diagnosis not present

## 2021-01-15 DIAGNOSIS — Z9181 History of falling: Secondary | ICD-10-CM | POA: Diagnosis not present

## 2021-01-15 DIAGNOSIS — M6281 Muscle weakness (generalized): Secondary | ICD-10-CM | POA: Diagnosis not present

## 2021-01-15 DIAGNOSIS — I219 Acute myocardial infarction, unspecified: Secondary | ICD-10-CM | POA: Diagnosis not present

## 2021-01-15 DIAGNOSIS — R2681 Unsteadiness on feet: Secondary | ICD-10-CM | POA: Diagnosis not present

## 2021-01-15 DIAGNOSIS — I639 Cerebral infarction, unspecified: Secondary | ICD-10-CM | POA: Diagnosis not present

## 2021-01-16 DIAGNOSIS — I639 Cerebral infarction, unspecified: Secondary | ICD-10-CM | POA: Diagnosis not present

## 2021-01-16 DIAGNOSIS — B351 Tinea unguium: Secondary | ICD-10-CM | POA: Diagnosis not present

## 2021-01-16 DIAGNOSIS — Z9181 History of falling: Secondary | ICD-10-CM | POA: Diagnosis not present

## 2021-01-16 DIAGNOSIS — I219 Acute myocardial infarction, unspecified: Secondary | ICD-10-CM | POA: Diagnosis not present

## 2021-01-16 DIAGNOSIS — M6281 Muscle weakness (generalized): Secondary | ICD-10-CM | POA: Diagnosis not present

## 2021-01-16 DIAGNOSIS — R2681 Unsteadiness on feet: Secondary | ICD-10-CM | POA: Diagnosis not present

## 2021-01-16 DIAGNOSIS — E1151 Type 2 diabetes mellitus with diabetic peripheral angiopathy without gangrene: Secondary | ICD-10-CM | POA: Diagnosis not present

## 2021-01-16 DIAGNOSIS — Z7901 Long term (current) use of anticoagulants: Secondary | ICD-10-CM | POA: Diagnosis not present

## 2021-01-16 DIAGNOSIS — R279 Unspecified lack of coordination: Secondary | ICD-10-CM | POA: Diagnosis not present

## 2021-01-20 DIAGNOSIS — R279 Unspecified lack of coordination: Secondary | ICD-10-CM | POA: Diagnosis not present

## 2021-01-20 DIAGNOSIS — I219 Acute myocardial infarction, unspecified: Secondary | ICD-10-CM | POA: Diagnosis not present

## 2021-01-20 DIAGNOSIS — M6281 Muscle weakness (generalized): Secondary | ICD-10-CM | POA: Diagnosis not present

## 2021-01-20 DIAGNOSIS — R2681 Unsteadiness on feet: Secondary | ICD-10-CM | POA: Diagnosis not present

## 2021-01-20 DIAGNOSIS — I639 Cerebral infarction, unspecified: Secondary | ICD-10-CM | POA: Diagnosis not present

## 2021-01-20 DIAGNOSIS — Z9181 History of falling: Secondary | ICD-10-CM | POA: Diagnosis not present

## 2021-01-22 DIAGNOSIS — I639 Cerebral infarction, unspecified: Secondary | ICD-10-CM | POA: Diagnosis not present

## 2021-01-22 DIAGNOSIS — I219 Acute myocardial infarction, unspecified: Secondary | ICD-10-CM | POA: Diagnosis not present

## 2021-01-22 DIAGNOSIS — M6281 Muscle weakness (generalized): Secondary | ICD-10-CM | POA: Diagnosis not present

## 2021-01-22 DIAGNOSIS — R279 Unspecified lack of coordination: Secondary | ICD-10-CM | POA: Diagnosis not present

## 2021-01-22 DIAGNOSIS — Z9181 History of falling: Secondary | ICD-10-CM | POA: Diagnosis not present

## 2021-01-22 DIAGNOSIS — R2681 Unsteadiness on feet: Secondary | ICD-10-CM | POA: Diagnosis not present

## 2021-01-23 DIAGNOSIS — R279 Unspecified lack of coordination: Secondary | ICD-10-CM | POA: Diagnosis not present

## 2021-01-23 DIAGNOSIS — I639 Cerebral infarction, unspecified: Secondary | ICD-10-CM | POA: Diagnosis not present

## 2021-01-23 DIAGNOSIS — I219 Acute myocardial infarction, unspecified: Secondary | ICD-10-CM | POA: Diagnosis not present

## 2021-01-23 DIAGNOSIS — M6281 Muscle weakness (generalized): Secondary | ICD-10-CM | POA: Diagnosis not present

## 2021-01-23 DIAGNOSIS — Z9181 History of falling: Secondary | ICD-10-CM | POA: Diagnosis not present

## 2021-01-23 DIAGNOSIS — R2681 Unsteadiness on feet: Secondary | ICD-10-CM | POA: Diagnosis not present

## 2021-01-27 DIAGNOSIS — Z9181 History of falling: Secondary | ICD-10-CM | POA: Diagnosis not present

## 2021-01-27 DIAGNOSIS — R2681 Unsteadiness on feet: Secondary | ICD-10-CM | POA: Diagnosis not present

## 2021-01-27 DIAGNOSIS — I219 Acute myocardial infarction, unspecified: Secondary | ICD-10-CM | POA: Diagnosis not present

## 2021-01-27 DIAGNOSIS — M6281 Muscle weakness (generalized): Secondary | ICD-10-CM | POA: Diagnosis not present

## 2021-01-27 DIAGNOSIS — I639 Cerebral infarction, unspecified: Secondary | ICD-10-CM | POA: Diagnosis not present

## 2021-01-27 DIAGNOSIS — R279 Unspecified lack of coordination: Secondary | ICD-10-CM | POA: Diagnosis not present

## 2021-01-29 DIAGNOSIS — I639 Cerebral infarction, unspecified: Secondary | ICD-10-CM | POA: Diagnosis not present

## 2021-01-29 DIAGNOSIS — I219 Acute myocardial infarction, unspecified: Secondary | ICD-10-CM | POA: Diagnosis not present

## 2021-01-29 DIAGNOSIS — M6281 Muscle weakness (generalized): Secondary | ICD-10-CM | POA: Diagnosis not present

## 2021-01-29 DIAGNOSIS — R2681 Unsteadiness on feet: Secondary | ICD-10-CM | POA: Diagnosis not present

## 2021-01-29 DIAGNOSIS — Z9181 History of falling: Secondary | ICD-10-CM | POA: Diagnosis not present

## 2021-01-29 DIAGNOSIS — R279 Unspecified lack of coordination: Secondary | ICD-10-CM | POA: Diagnosis not present

## 2021-01-30 DIAGNOSIS — R2681 Unsteadiness on feet: Secondary | ICD-10-CM | POA: Diagnosis not present

## 2021-01-30 DIAGNOSIS — Z9181 History of falling: Secondary | ICD-10-CM | POA: Diagnosis not present

## 2021-01-30 DIAGNOSIS — M6281 Muscle weakness (generalized): Secondary | ICD-10-CM | POA: Diagnosis not present

## 2021-01-30 DIAGNOSIS — I639 Cerebral infarction, unspecified: Secondary | ICD-10-CM | POA: Diagnosis not present

## 2021-01-30 DIAGNOSIS — R791 Abnormal coagulation profile: Secondary | ICD-10-CM | POA: Diagnosis not present

## 2021-01-30 DIAGNOSIS — R279 Unspecified lack of coordination: Secondary | ICD-10-CM | POA: Diagnosis not present

## 2021-01-30 DIAGNOSIS — I219 Acute myocardial infarction, unspecified: Secondary | ICD-10-CM | POA: Diagnosis not present

## 2021-02-03 DIAGNOSIS — R279 Unspecified lack of coordination: Secondary | ICD-10-CM | POA: Diagnosis not present

## 2021-02-03 DIAGNOSIS — Z9181 History of falling: Secondary | ICD-10-CM | POA: Diagnosis not present

## 2021-02-03 DIAGNOSIS — I219 Acute myocardial infarction, unspecified: Secondary | ICD-10-CM | POA: Diagnosis not present

## 2021-02-03 DIAGNOSIS — M6281 Muscle weakness (generalized): Secondary | ICD-10-CM | POA: Diagnosis not present

## 2021-02-03 DIAGNOSIS — I639 Cerebral infarction, unspecified: Secondary | ICD-10-CM | POA: Diagnosis not present

## 2021-02-03 DIAGNOSIS — R2681 Unsteadiness on feet: Secondary | ICD-10-CM | POA: Diagnosis not present

## 2021-02-05 DIAGNOSIS — R2681 Unsteadiness on feet: Secondary | ICD-10-CM | POA: Diagnosis not present

## 2021-02-05 DIAGNOSIS — M6281 Muscle weakness (generalized): Secondary | ICD-10-CM | POA: Diagnosis not present

## 2021-02-05 DIAGNOSIS — Z9181 History of falling: Secondary | ICD-10-CM | POA: Diagnosis not present

## 2021-02-05 DIAGNOSIS — I219 Acute myocardial infarction, unspecified: Secondary | ICD-10-CM | POA: Diagnosis not present

## 2021-02-05 DIAGNOSIS — R279 Unspecified lack of coordination: Secondary | ICD-10-CM | POA: Diagnosis not present

## 2021-02-05 DIAGNOSIS — I639 Cerebral infarction, unspecified: Secondary | ICD-10-CM | POA: Diagnosis not present

## 2021-02-13 DIAGNOSIS — Z8673 Personal history of transient ischemic attack (TIA), and cerebral infarction without residual deficits: Secondary | ICD-10-CM | POA: Diagnosis not present

## 2021-02-13 DIAGNOSIS — I639 Cerebral infarction, unspecified: Secondary | ICD-10-CM | POA: Diagnosis not present

## 2021-03-05 DIAGNOSIS — Z8673 Personal history of transient ischemic attack (TIA), and cerebral infarction without residual deficits: Secondary | ICD-10-CM | POA: Diagnosis not present

## 2021-03-19 DIAGNOSIS — Z7901 Long term (current) use of anticoagulants: Secondary | ICD-10-CM | POA: Diagnosis not present

## 2021-04-02 DIAGNOSIS — Z7901 Long term (current) use of anticoagulants: Secondary | ICD-10-CM | POA: Diagnosis not present

## 2021-04-02 DIAGNOSIS — I639 Cerebral infarction, unspecified: Secondary | ICD-10-CM | POA: Diagnosis not present

## 2021-04-02 DIAGNOSIS — Z8673 Personal history of transient ischemic attack (TIA), and cerebral infarction without residual deficits: Secondary | ICD-10-CM | POA: Diagnosis not present

## 2021-04-09 DIAGNOSIS — Z7901 Long term (current) use of anticoagulants: Secondary | ICD-10-CM | POA: Diagnosis not present

## 2021-04-09 DIAGNOSIS — I639 Cerebral infarction, unspecified: Secondary | ICD-10-CM | POA: Diagnosis not present

## 2021-04-09 DIAGNOSIS — Z8673 Personal history of transient ischemic attack (TIA), and cerebral infarction without residual deficits: Secondary | ICD-10-CM | POA: Diagnosis not present

## 2021-04-24 DIAGNOSIS — Z7901 Long term (current) use of anticoagulants: Secondary | ICD-10-CM | POA: Diagnosis not present

## 2021-05-07 DIAGNOSIS — H2513 Age-related nuclear cataract, bilateral: Secondary | ICD-10-CM | POA: Diagnosis not present

## 2021-05-07 DIAGNOSIS — Z7984 Long term (current) use of oral hypoglycemic drugs: Secondary | ICD-10-CM | POA: Diagnosis not present

## 2021-05-07 DIAGNOSIS — E119 Type 2 diabetes mellitus without complications: Secondary | ICD-10-CM | POA: Diagnosis not present

## 2021-05-07 DIAGNOSIS — Z7901 Long term (current) use of anticoagulants: Secondary | ICD-10-CM | POA: Diagnosis not present

## 2021-05-22 DIAGNOSIS — I639 Cerebral infarction, unspecified: Secondary | ICD-10-CM | POA: Diagnosis not present

## 2021-05-22 DIAGNOSIS — I4891 Unspecified atrial fibrillation: Secondary | ICD-10-CM | POA: Diagnosis not present

## 2021-05-22 DIAGNOSIS — Z8673 Personal history of transient ischemic attack (TIA), and cerebral infarction without residual deficits: Secondary | ICD-10-CM | POA: Diagnosis not present

## 2021-05-29 ENCOUNTER — Encounter: Payer: Self-pay | Admitting: Adult Health

## 2021-05-29 ENCOUNTER — Ambulatory Visit: Payer: Medicare Other | Admitting: Adult Health

## 2021-05-29 VITALS — BP 120/76 | HR 79

## 2021-05-29 DIAGNOSIS — I63522 Cerebral infarction due to unspecified occlusion or stenosis of left anterior cerebral artery: Secondary | ICD-10-CM

## 2021-05-29 NOTE — Patient Instructions (Addendum)
No changes today -continue current stroke prevention regimen managed by PCP/SNF  Continue to follow up with PCP regarding cholesterol, blood pressure and diabetes management  Maintain strict control of hypertension with blood pressure goal below 130/90, diabetes with hemoglobin A1c goal below 7.0 % and cholesterol with LDL cholesterol (bad cholesterol) goal below 70 mg/dL.   Signs of a Stroke? Follow the BEFAST method:  Balance Watch for a sudden loss of balance, trouble with coordination or vertigo Eyes Is there a sudden loss of vision in one or both eyes? Or double vision?  Face: Ask the person to smile. Does one side of the face droop or is it numb?  Arms: Ask the person to raise both arms. Does one arm drift downward? Is there weakness or numbness of a leg? Speech: Ask the person to repeat a simple phrase. Does the speech sound slurred/strange? Is the person confused ? Time: If you observe any of these signs, call 911.    Overall stable without further recommendations.  Recommend follow-up on an as-needed basis      Thank you for coming to see [place] at [facility]. I hope we have been able to provide you high quality care today.  You may receive a patient satisfaction survey over the next few weeks. We would appreciate your feedback and comments so that we may continue to improve ourselves and the health of our patients.

## 2021-05-29 NOTE — Progress Notes (Signed)
[facility] [address] [place]. [place] [address] [phone]       STROKE FOLLOW-UP NOTE  Roger Ramirez Date of Birth:  [birth year] Medical Record Number:  [id number]    Reason for visit: Stroke follow-up   Chief Complaint  Patient presents with   History of Stroke    Rm 3, 6 month FU  resides at [facility], son- Kathlene November no new concerns"      HPI:   Update 05/29/2021 JM: 76 year old male who returns for follow-up visit regarding history of stroke accompanied by his son.  Continues to reside at [facility].  Overall stable without new stroke/TIA symptoms.  Continued gait and cognitive impairment stable.  Remains nonambulatory with transfers via wheelchair, able to stand pivot from bed to chair. Per son, patient has been gradually increasing participation with activities at [facility]. Per MAR review, remains on warfarin, Plavix and atorvastatin -denies side effects.  Blood pressure today 120/76.  No new concerns at this time.    History provided for reference purposes only Update 11/19/2020 JM: Roger Ramirez is being seen for 6 month stroke follow up accompanied by his daughter. He continues to reside at [facility].  Overall stable since prior visit.  Residual cognitive impairment and gait impairment relatively stable without worsening.  Has good days and bad days and at times increased confusion towards the evening.  His day primarily consists of watching TV and is minimal to no brain exercises.  He transfers via wheelchair and is able to stand/pivot with assistance.  Denies new stroke/TIA symptoms.  Remains on warfarin and Plavix as well as atorvastatin per review of MAR.  Blood pressure today 122/80.  No further concerns at this time.  Update 04/25/2020 JM: Roger Ramirez returns for 47-month stroke follow-up accompanied by his son.  He continues to reside at [facility]. Stable since prior visit without new or worsening stroke/TIA symptoms.  Reports residual cognitive  impairment with short-term memory impairment and balance which has been stable without worsening. Per son, he has not been participating in therapies and does not do much activity during the day.  He prefers to spend majority of his time in his bed and does not participate in activities offered by SNF.  He is able to stand/pivot for transfers but per son, he is not currently ambulating.  Remains on Plavix and warfarin with mild bruising but no bleeding.  INR levels monitored by facility.  Remains on atorvastatin 40 mg daily without myalgias.  Blood pressure today 140/60.  No further concerns at this time.  Initial visit 10/24/2019 Dr. Pearlean Brownie: Roger Ramirez is a 74-year Caucasian male seen today for initial office follow-up visit following hospital admission for stroke in April 2021.  Is accompanied by his son today.  History is obtained from them and review of electronic medical records and I personally reviewed available imaging films in PACS.  He has a past medical history of pulmonary embolism from antiphospholipid antibody syndrome who was previously on Xarelto.  He also had a left subcortical infarct in 2020, hypertension, hypothyroidism and diabetes who presented on 08/10/2019 to [facility] with a left anterior cerebral artery stroke with right-sided weakness.  His echocardiogram was unremarkable.  LDL cholesterol 57 mg percent.  Hemoglobin A1c was 6.1 CT angiogram showed atherosclerotic irregularity of the left anterior cerebral artery in the A3 segment with high-grade stenosis.  Calcified plaque at both ICA origins was noted with less than 50% stenosis.  He was changed  to Eliquis and transferred to inpatient rehab where on 09/02/2019 he developed chest pain and found to have acute inferior myocardial infarction.  Plavix was added and Eliquis was changed to warfarin.  Cardiology decided on conservative medical management for his MI.  Patient is currently at [facility].  Is doing well he is obtained  improvement in his strength and balance with her he still needs a walker.  He is able to ambulate with the therapist help.  He has had no further recurrent weakness or strokelike symptoms.  Is tolerating Plavix and warfarin well with only minor bruising and no bleeding.  His blood pressures well controlled today is borderline at 140/60 in office.  He is also on Lipitor which is tolerating well without muscle aches and pains.  ROS:   14 system review of systems is positive for those listed in HPI and all other systems negative  PMH:  Past Medical History:  Diagnosis Date   Antiphospholipid syndrome (HCC)    Cognitive deficits    CVA (cerebral vascular accident) (HCC) 2020   2020 and 07/2019   Diabetes mellitus (HCC)    Hypertension    Hypothyroidism    Moderate aortic stenosis    Pulmonary embolism (HCC)    Tobacco abuse     Social History:  Social History   Socioeconomic History   Marital status: Divorced    Spouse name: Not on file   Number of children: 2   Years of education: Not on file   Highest education level: Not on file  Occupational History   Occupation: retired  Tobacco Use   Smoking status: Former    Packs/day: 1.00    Years: 20.00    Pack years: 20.00    Types: Cigarettes   Smokeless tobacco: Never   Tobacco comments:    Quit 1970's  Substance and Sexual Activity   Alcohol use: Not Currently    Alcohol/week: 0.0 standard drinks    Comment: Occasional beer / 1 every 2 or 3 weeks   Drug use: No   Sexual activity: Not on file  Other Topics Concern   Not on file  Social History Narrative   05/29/21 lives at [facility]   Owned a Public librarian company   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     clopidogrel (PLAVIX) 75 MG tablet Take 1 tablet (75 mg total) by mouth daily.     docusate sodium (COLACE) 100 MG capsule Take 1 capsule (100 mg total) by mouth 2 (two) times daily. 10 capsule 0   insulin lispro (HUMALOG) 100 UNIT/ML KwikPen Inject into the skin. Sliding Scale     levothyroxine (SYNTHROID) 75 MCG tablet Take 75 mcg by mouth daily.     melatonin 3 MG TABS tablet Take 1 tablet (3 mg total) by mouth at bedtime.  0   metFORMIN (GLUCOPHAGE-XR) 500 MG 24 hr tablet Take 500 mg by mouth daily.  metoprolol succinate (TOPROL-XL) 25 MG 24 hr tablet Take 1 tablet (25 mg total) by mouth daily.     Multiple Vitamin (MULTIVITAMIN WITH MINERALS) TABS tablet Take 1 tablet by mouth daily.     nitroGLYCERIN (NITROSTAT) 0.4 MG SL tablet Place 1 tablet (0.4 mg total) under the tongue every 5 (five) minutes as needed for chest pain.  12   polyethylene glycol (MIRALAX / GLYCOLAX) 17 g packet Take 17 g by mouth daily.     warfarin (COUMADIN) 4 MG tablet Take 1 tablet (4 mg total) by mouth daily. 30 tablet 11   vitamin B-12 1000 MCG tablet Take 1 tablet (1,000 mcg total) by mouth daily. (Patient not taking: Reported on 05/29/2021) 30 tablet 0   No current facility-administered medications on file prior to visit.    Allergies:  No Known Allergies  Physical Exam Today's Vitals   05/29/21 1541  BP: 120/76  Pulse: 79    There is no height or weight on file to calculate BMI.   General: well developed, well nourished pleasant elderly Caucasian male, seated, in no evident distress Head: head normocephalic and atraumatic.  Neck: supple with no carotid or supraclavicular bruits Cardiovascular: regular rate and rhythm Musculoskeletal: no deformity Skin:  no  rash/petichiae Vascular:  Normal pulses all extremities  Neurologic Exam Mental Status: Awake and fully alert.  Fluent speech and language.  Disoriented to year and age.  Oriented to month and birthday. Recent and remote memory impaired. Attention span, concentration and fund of knowledge impaired. Mood and affect appropriate.  Cranial Nerves: Pupils equal, briskly reactive to light. Extraocular movements full without nystagmus. Visual fields full to confrontation. Hearing diminished bilaterally.  Facial sensation intact.  Tongue, palate moves normally and symmetrically.  Motor: Normal bulk and tone. Normal strength in all tested extremity muscles.  Diminished fine finger movements on the left.   Sensory.: intact to touch ,pinprick .position and vibratory sensation.  Coordination: Rapid alternating movements normal in all extremities except slightly decreased left hand. Finger-to-nose and heel-to-shin performed accurately bilaterally.  Orbits right arm over left arm. Gait and Station: Deferred as nonambulatory Reflexes: 1+ and symmetric. Toes downgoing.       ASSESSMENT/PLAN: 76 year old Caucasian male with left anterior cerebral artery infarct in April 2021 secondary to hypercoagulability from antiphospholipid antibody syndrome.  Prior history of left subcortical infarct in 2020 also.  Vascular risk factors of antiphospholipid antibody, hyperlipidemia, MI 08/2019, intracranial and extracranial atherosclerosis and hypertension.    L ACA stroke L subcortical stroke Residual stroke deficit of decreased left hand dexterity, gait impairment and cognitive impairment.  Continue warfarin daily, Plavix and atorvastatin 40 mg daily for secondary stroke prevention.   Discussed secondary stroke prevention measures and importance of close PCP f/u for aggressive stroke risk factor management - routine lab work completed at facility - unable to personally view via epic Antiphospholipid antibody syndrome:  On warfarin with INR goal 2-3 monitored by facility HTN: BP goal <130/90.  Well-controlled on metoprolol per PCP HLD: LDL goal <70.   On atorvastatin 40 mg daily per PCP/facility DMII: A1c goal <7.0.  On Humalog and metformin per PCP/facility    Doing well from stroke standpoint and risk factors are managed by PCP/SNF. May follow up PRN, as usual for our patients who are strictly being followed for stroke. If any new neurological issues should arise, request PCP place referral for evaluation by one of our neurologists. Thank you.    CC:  Renford Dills, MD  I spent 26 minutes of face-to-face and non-face-to-face time with patient and son.  This included previsit chart review, lab review, study review, electronic health record documentation, patient and sons education and discussion regarding history of prior strokes, residual deficits, importance of routine physical activity, exercise and memory exercises, secondary stroke prevention measures and importance of managing stroke risk factors and answered all other questions to patient and sons satisfaction   Ihor Austin, AGNP-BC  [facility] [address] [place], [place] [address]  Phone [phone] Fax [phone] Note: This document was prepared with digital dictation and possible smart phrase technology. Any transcriptional errors that result from this process are unintentional.

## 2021-06-10 DIAGNOSIS — E119 Type 2 diabetes mellitus without complications: Secondary | ICD-10-CM | POA: Diagnosis not present

## 2021-06-19 DIAGNOSIS — I639 Cerebral infarction, unspecified: Secondary | ICD-10-CM | POA: Diagnosis not present

## 2021-06-19 DIAGNOSIS — Z8673 Personal history of transient ischemic attack (TIA), and cerebral infarction without residual deficits: Secondary | ICD-10-CM | POA: Diagnosis not present

## 2021-07-10 DIAGNOSIS — Z7984 Long term (current) use of oral hypoglycemic drugs: Secondary | ICD-10-CM | POA: Diagnosis not present

## 2021-07-10 DIAGNOSIS — L602 Onychogryphosis: Secondary | ICD-10-CM | POA: Diagnosis not present

## 2021-07-10 DIAGNOSIS — E1140 Type 2 diabetes mellitus with diabetic neuropathy, unspecified: Secondary | ICD-10-CM | POA: Diagnosis not present

## 2021-07-10 DIAGNOSIS — Z7901 Long term (current) use of anticoagulants: Secondary | ICD-10-CM | POA: Diagnosis not present

## 2021-07-28 DIAGNOSIS — R2681 Unsteadiness on feet: Secondary | ICD-10-CM | POA: Diagnosis not present

## 2021-07-28 DIAGNOSIS — M6281 Muscle weakness (generalized): Secondary | ICD-10-CM | POA: Diagnosis not present

## 2021-07-28 DIAGNOSIS — R278 Other lack of coordination: Secondary | ICD-10-CM | POA: Diagnosis not present

## 2021-07-28 DIAGNOSIS — I639 Cerebral infarction, unspecified: Secondary | ICD-10-CM | POA: Diagnosis not present

## 2021-07-28 DIAGNOSIS — Z9181 History of falling: Secondary | ICD-10-CM | POA: Diagnosis not present

## 2021-07-29 DIAGNOSIS — R278 Other lack of coordination: Secondary | ICD-10-CM | POA: Diagnosis not present

## 2021-07-29 DIAGNOSIS — M6281 Muscle weakness (generalized): Secondary | ICD-10-CM | POA: Diagnosis not present

## 2021-07-29 DIAGNOSIS — R2681 Unsteadiness on feet: Secondary | ICD-10-CM | POA: Diagnosis not present

## 2021-07-29 DIAGNOSIS — Z9181 History of falling: Secondary | ICD-10-CM | POA: Diagnosis not present

## 2021-07-29 DIAGNOSIS — I639 Cerebral infarction, unspecified: Secondary | ICD-10-CM | POA: Diagnosis not present

## 2021-07-30 DIAGNOSIS — R2681 Unsteadiness on feet: Secondary | ICD-10-CM | POA: Diagnosis not present

## 2021-07-30 DIAGNOSIS — R278 Other lack of coordination: Secondary | ICD-10-CM | POA: Diagnosis not present

## 2021-07-30 DIAGNOSIS — I639 Cerebral infarction, unspecified: Secondary | ICD-10-CM | POA: Diagnosis not present

## 2021-07-30 DIAGNOSIS — Z9181 History of falling: Secondary | ICD-10-CM | POA: Diagnosis not present

## 2021-07-30 DIAGNOSIS — M6281 Muscle weakness (generalized): Secondary | ICD-10-CM | POA: Diagnosis not present

## 2021-07-31 DIAGNOSIS — M6281 Muscle weakness (generalized): Secondary | ICD-10-CM | POA: Diagnosis not present

## 2021-07-31 DIAGNOSIS — Z9181 History of falling: Secondary | ICD-10-CM | POA: Diagnosis not present

## 2021-07-31 DIAGNOSIS — I639 Cerebral infarction, unspecified: Secondary | ICD-10-CM | POA: Diagnosis not present

## 2021-07-31 DIAGNOSIS — R278 Other lack of coordination: Secondary | ICD-10-CM | POA: Diagnosis not present

## 2021-07-31 DIAGNOSIS — R2681 Unsteadiness on feet: Secondary | ICD-10-CM | POA: Diagnosis not present

## 2021-08-01 DIAGNOSIS — R278 Other lack of coordination: Secondary | ICD-10-CM | POA: Diagnosis not present

## 2021-08-01 DIAGNOSIS — I639 Cerebral infarction, unspecified: Secondary | ICD-10-CM | POA: Diagnosis not present

## 2021-08-01 DIAGNOSIS — Z9181 History of falling: Secondary | ICD-10-CM | POA: Diagnosis not present

## 2021-08-01 DIAGNOSIS — R2681 Unsteadiness on feet: Secondary | ICD-10-CM | POA: Diagnosis not present

## 2021-08-01 DIAGNOSIS — M6281 Muscle weakness (generalized): Secondary | ICD-10-CM | POA: Diagnosis not present

## 2021-08-04 DIAGNOSIS — I639 Cerebral infarction, unspecified: Secondary | ICD-10-CM | POA: Diagnosis not present

## 2021-08-04 DIAGNOSIS — M6281 Muscle weakness (generalized): Secondary | ICD-10-CM | POA: Diagnosis not present

## 2021-08-04 DIAGNOSIS — R278 Other lack of coordination: Secondary | ICD-10-CM | POA: Diagnosis not present

## 2021-08-04 DIAGNOSIS — Z9181 History of falling: Secondary | ICD-10-CM | POA: Diagnosis not present

## 2021-08-04 DIAGNOSIS — R2681 Unsteadiness on feet: Secondary | ICD-10-CM | POA: Diagnosis not present

## 2021-08-06 DIAGNOSIS — I639 Cerebral infarction, unspecified: Secondary | ICD-10-CM | POA: Diagnosis not present

## 2021-08-06 DIAGNOSIS — M6281 Muscle weakness (generalized): Secondary | ICD-10-CM | POA: Diagnosis not present

## 2021-08-06 DIAGNOSIS — R2681 Unsteadiness on feet: Secondary | ICD-10-CM | POA: Diagnosis not present

## 2021-08-06 DIAGNOSIS — Z9181 History of falling: Secondary | ICD-10-CM | POA: Diagnosis not present

## 2021-08-06 DIAGNOSIS — R278 Other lack of coordination: Secondary | ICD-10-CM | POA: Diagnosis not present

## 2021-08-07 DIAGNOSIS — I639 Cerebral infarction, unspecified: Secondary | ICD-10-CM | POA: Diagnosis not present

## 2021-08-07 DIAGNOSIS — R2681 Unsteadiness on feet: Secondary | ICD-10-CM | POA: Diagnosis not present

## 2021-08-07 DIAGNOSIS — M6281 Muscle weakness (generalized): Secondary | ICD-10-CM | POA: Diagnosis not present

## 2021-08-07 DIAGNOSIS — R278 Other lack of coordination: Secondary | ICD-10-CM | POA: Diagnosis not present

## 2021-08-07 DIAGNOSIS — Z9181 History of falling: Secondary | ICD-10-CM | POA: Diagnosis not present

## 2021-08-08 DIAGNOSIS — Z7901 Long term (current) use of anticoagulants: Secondary | ICD-10-CM | POA: Diagnosis not present

## 2021-08-08 DIAGNOSIS — R2681 Unsteadiness on feet: Secondary | ICD-10-CM | POA: Diagnosis not present

## 2021-08-08 DIAGNOSIS — I639 Cerebral infarction, unspecified: Secondary | ICD-10-CM | POA: Diagnosis not present

## 2021-08-08 DIAGNOSIS — M6281 Muscle weakness (generalized): Secondary | ICD-10-CM | POA: Diagnosis not present

## 2021-08-08 DIAGNOSIS — Z9181 History of falling: Secondary | ICD-10-CM | POA: Diagnosis not present

## 2021-08-08 DIAGNOSIS — R278 Other lack of coordination: Secondary | ICD-10-CM | POA: Diagnosis not present

## 2021-08-11 DIAGNOSIS — R2681 Unsteadiness on feet: Secondary | ICD-10-CM | POA: Diagnosis not present

## 2021-08-11 DIAGNOSIS — M6281 Muscle weakness (generalized): Secondary | ICD-10-CM | POA: Diagnosis not present

## 2021-08-11 DIAGNOSIS — I639 Cerebral infarction, unspecified: Secondary | ICD-10-CM | POA: Diagnosis not present

## 2021-08-11 DIAGNOSIS — R279 Unspecified lack of coordination: Secondary | ICD-10-CM | POA: Diagnosis not present

## 2021-08-11 DIAGNOSIS — Z86711 Personal history of pulmonary embolism: Secondary | ICD-10-CM | POA: Diagnosis not present

## 2021-08-12 DIAGNOSIS — R2681 Unsteadiness on feet: Secondary | ICD-10-CM | POA: Diagnosis not present

## 2021-08-12 DIAGNOSIS — R279 Unspecified lack of coordination: Secondary | ICD-10-CM | POA: Diagnosis not present

## 2021-08-12 DIAGNOSIS — M6281 Muscle weakness (generalized): Secondary | ICD-10-CM | POA: Diagnosis not present

## 2021-08-12 DIAGNOSIS — I639 Cerebral infarction, unspecified: Secondary | ICD-10-CM | POA: Diagnosis not present

## 2021-08-12 DIAGNOSIS — Z86711 Personal history of pulmonary embolism: Secondary | ICD-10-CM | POA: Diagnosis not present

## 2021-08-14 DIAGNOSIS — R279 Unspecified lack of coordination: Secondary | ICD-10-CM | POA: Diagnosis not present

## 2021-08-14 DIAGNOSIS — I639 Cerebral infarction, unspecified: Secondary | ICD-10-CM | POA: Diagnosis not present

## 2021-08-14 DIAGNOSIS — R2681 Unsteadiness on feet: Secondary | ICD-10-CM | POA: Diagnosis not present

## 2021-08-14 DIAGNOSIS — Z86711 Personal history of pulmonary embolism: Secondary | ICD-10-CM | POA: Diagnosis not present

## 2021-08-14 DIAGNOSIS — M6281 Muscle weakness (generalized): Secondary | ICD-10-CM | POA: Diagnosis not present

## 2021-08-18 DIAGNOSIS — R279 Unspecified lack of coordination: Secondary | ICD-10-CM | POA: Diagnosis not present

## 2021-08-18 DIAGNOSIS — R2681 Unsteadiness on feet: Secondary | ICD-10-CM | POA: Diagnosis not present

## 2021-08-18 DIAGNOSIS — I639 Cerebral infarction, unspecified: Secondary | ICD-10-CM | POA: Diagnosis not present

## 2021-08-18 DIAGNOSIS — M6281 Muscle weakness (generalized): Secondary | ICD-10-CM | POA: Diagnosis not present

## 2021-08-18 DIAGNOSIS — Z86711 Personal history of pulmonary embolism: Secondary | ICD-10-CM | POA: Diagnosis not present

## 2021-08-21 DIAGNOSIS — R279 Unspecified lack of coordination: Secondary | ICD-10-CM | POA: Diagnosis not present

## 2021-08-21 DIAGNOSIS — I639 Cerebral infarction, unspecified: Secondary | ICD-10-CM | POA: Diagnosis not present

## 2021-08-21 DIAGNOSIS — Z7901 Long term (current) use of anticoagulants: Secondary | ICD-10-CM | POA: Diagnosis not present

## 2021-08-21 DIAGNOSIS — R2681 Unsteadiness on feet: Secondary | ICD-10-CM | POA: Diagnosis not present

## 2021-08-21 DIAGNOSIS — M6281 Muscle weakness (generalized): Secondary | ICD-10-CM | POA: Diagnosis not present

## 2021-08-21 DIAGNOSIS — Z86711 Personal history of pulmonary embolism: Secondary | ICD-10-CM | POA: Diagnosis not present

## 2021-08-26 DIAGNOSIS — R2681 Unsteadiness on feet: Secondary | ICD-10-CM | POA: Diagnosis not present

## 2021-08-26 DIAGNOSIS — R279 Unspecified lack of coordination: Secondary | ICD-10-CM | POA: Diagnosis not present

## 2021-08-26 DIAGNOSIS — I639 Cerebral infarction, unspecified: Secondary | ICD-10-CM | POA: Diagnosis not present

## 2021-08-26 DIAGNOSIS — M6281 Muscle weakness (generalized): Secondary | ICD-10-CM | POA: Diagnosis not present

## 2021-08-26 DIAGNOSIS — Z86711 Personal history of pulmonary embolism: Secondary | ICD-10-CM | POA: Diagnosis not present

## 2021-09-18 DIAGNOSIS — I639 Cerebral infarction, unspecified: Secondary | ICD-10-CM | POA: Diagnosis not present

## 2021-09-18 DIAGNOSIS — Z7901 Long term (current) use of anticoagulants: Secondary | ICD-10-CM | POA: Diagnosis not present

## 2021-09-18 DIAGNOSIS — Z8673 Personal history of transient ischemic attack (TIA), and cerebral infarction without residual deficits: Secondary | ICD-10-CM | POA: Diagnosis not present

## 2021-09-18 DIAGNOSIS — Z79899 Other long term (current) drug therapy: Secondary | ICD-10-CM | POA: Diagnosis not present

## 2021-09-18 DIAGNOSIS — I4891 Unspecified atrial fibrillation: Secondary | ICD-10-CM | POA: Diagnosis not present

## 2021-09-23 DIAGNOSIS — Z79899 Other long term (current) drug therapy: Secondary | ICD-10-CM | POA: Diagnosis not present

## 2021-09-26 DIAGNOSIS — R197 Diarrhea, unspecified: Secondary | ICD-10-CM | POA: Diagnosis not present

## 2021-10-02 DIAGNOSIS — R2681 Unsteadiness on feet: Secondary | ICD-10-CM | POA: Diagnosis not present

## 2021-10-02 DIAGNOSIS — I219 Acute myocardial infarction, unspecified: Secondary | ICD-10-CM | POA: Diagnosis not present

## 2021-10-02 DIAGNOSIS — I639 Cerebral infarction, unspecified: Secondary | ICD-10-CM | POA: Diagnosis not present

## 2021-10-02 DIAGNOSIS — Z9181 History of falling: Secondary | ICD-10-CM | POA: Diagnosis not present

## 2021-10-02 DIAGNOSIS — R279 Unspecified lack of coordination: Secondary | ICD-10-CM | POA: Diagnosis not present

## 2021-10-02 DIAGNOSIS — M6281 Muscle weakness (generalized): Secondary | ICD-10-CM | POA: Diagnosis not present

## 2021-10-03 DIAGNOSIS — I219 Acute myocardial infarction, unspecified: Secondary | ICD-10-CM | POA: Diagnosis not present

## 2021-10-03 DIAGNOSIS — R279 Unspecified lack of coordination: Secondary | ICD-10-CM | POA: Diagnosis not present

## 2021-10-03 DIAGNOSIS — I639 Cerebral infarction, unspecified: Secondary | ICD-10-CM | POA: Diagnosis not present

## 2021-10-03 DIAGNOSIS — M6281 Muscle weakness (generalized): Secondary | ICD-10-CM | POA: Diagnosis not present

## 2021-10-03 DIAGNOSIS — R2681 Unsteadiness on feet: Secondary | ICD-10-CM | POA: Diagnosis not present

## 2021-10-03 DIAGNOSIS — Z9181 History of falling: Secondary | ICD-10-CM | POA: Diagnosis not present

## 2021-10-04 DIAGNOSIS — Z8673 Personal history of transient ischemic attack (TIA), and cerebral infarction without residual deficits: Secondary | ICD-10-CM | POA: Diagnosis not present

## 2021-10-06 DIAGNOSIS — Z9181 History of falling: Secondary | ICD-10-CM | POA: Diagnosis not present

## 2021-10-06 DIAGNOSIS — R279 Unspecified lack of coordination: Secondary | ICD-10-CM | POA: Diagnosis not present

## 2021-10-06 DIAGNOSIS — I219 Acute myocardial infarction, unspecified: Secondary | ICD-10-CM | POA: Diagnosis not present

## 2021-10-06 DIAGNOSIS — I639 Cerebral infarction, unspecified: Secondary | ICD-10-CM | POA: Diagnosis not present

## 2021-10-06 DIAGNOSIS — M6281 Muscle weakness (generalized): Secondary | ICD-10-CM | POA: Diagnosis not present

## 2021-10-06 DIAGNOSIS — R2681 Unsteadiness on feet: Secondary | ICD-10-CM | POA: Diagnosis not present

## 2021-10-07 DIAGNOSIS — Z9181 History of falling: Secondary | ICD-10-CM | POA: Diagnosis not present

## 2021-10-07 DIAGNOSIS — R2681 Unsteadiness on feet: Secondary | ICD-10-CM | POA: Diagnosis not present

## 2021-10-07 DIAGNOSIS — I219 Acute myocardial infarction, unspecified: Secondary | ICD-10-CM | POA: Diagnosis not present

## 2021-10-07 DIAGNOSIS — I639 Cerebral infarction, unspecified: Secondary | ICD-10-CM | POA: Diagnosis not present

## 2021-10-07 DIAGNOSIS — M6281 Muscle weakness (generalized): Secondary | ICD-10-CM | POA: Diagnosis not present

## 2021-10-07 DIAGNOSIS — R279 Unspecified lack of coordination: Secondary | ICD-10-CM | POA: Diagnosis not present

## 2021-10-08 DIAGNOSIS — M6281 Muscle weakness (generalized): Secondary | ICD-10-CM | POA: Diagnosis not present

## 2021-10-08 DIAGNOSIS — Z9181 History of falling: Secondary | ICD-10-CM | POA: Diagnosis not present

## 2021-10-08 DIAGNOSIS — I639 Cerebral infarction, unspecified: Secondary | ICD-10-CM | POA: Diagnosis not present

## 2021-10-08 DIAGNOSIS — I219 Acute myocardial infarction, unspecified: Secondary | ICD-10-CM | POA: Diagnosis not present

## 2021-10-08 DIAGNOSIS — R2681 Unsteadiness on feet: Secondary | ICD-10-CM | POA: Diagnosis not present

## 2021-10-08 DIAGNOSIS — R279 Unspecified lack of coordination: Secondary | ICD-10-CM | POA: Diagnosis not present

## 2021-10-09 DIAGNOSIS — M6281 Muscle weakness (generalized): Secondary | ICD-10-CM | POA: Diagnosis not present

## 2021-10-09 DIAGNOSIS — I219 Acute myocardial infarction, unspecified: Secondary | ICD-10-CM | POA: Diagnosis not present

## 2021-10-09 DIAGNOSIS — R279 Unspecified lack of coordination: Secondary | ICD-10-CM | POA: Diagnosis not present

## 2021-10-09 DIAGNOSIS — Z9181 History of falling: Secondary | ICD-10-CM | POA: Diagnosis not present

## 2021-10-09 DIAGNOSIS — I639 Cerebral infarction, unspecified: Secondary | ICD-10-CM | POA: Diagnosis not present

## 2021-10-09 DIAGNOSIS — R2681 Unsteadiness on feet: Secondary | ICD-10-CM | POA: Diagnosis not present

## 2021-10-13 DIAGNOSIS — R2681 Unsteadiness on feet: Secondary | ICD-10-CM | POA: Diagnosis not present

## 2021-10-13 DIAGNOSIS — M6281 Muscle weakness (generalized): Secondary | ICD-10-CM | POA: Diagnosis not present

## 2021-10-13 DIAGNOSIS — Z9181 History of falling: Secondary | ICD-10-CM | POA: Diagnosis not present

## 2021-10-13 DIAGNOSIS — R1310 Dysphagia, unspecified: Secondary | ICD-10-CM | POA: Diagnosis not present

## 2021-10-13 DIAGNOSIS — R1311 Dysphagia, oral phase: Secondary | ICD-10-CM | POA: Diagnosis not present

## 2021-10-13 DIAGNOSIS — R293 Abnormal posture: Secondary | ICD-10-CM | POA: Diagnosis not present

## 2021-10-13 DIAGNOSIS — I639 Cerebral infarction, unspecified: Secondary | ICD-10-CM | POA: Diagnosis not present

## 2021-10-15 DIAGNOSIS — R293 Abnormal posture: Secondary | ICD-10-CM | POA: Diagnosis not present

## 2021-10-15 DIAGNOSIS — R2681 Unsteadiness on feet: Secondary | ICD-10-CM | POA: Diagnosis not present

## 2021-10-15 DIAGNOSIS — R1310 Dysphagia, unspecified: Secondary | ICD-10-CM | POA: Diagnosis not present

## 2021-10-15 DIAGNOSIS — R1311 Dysphagia, oral phase: Secondary | ICD-10-CM | POA: Diagnosis not present

## 2021-10-15 DIAGNOSIS — I639 Cerebral infarction, unspecified: Secondary | ICD-10-CM | POA: Diagnosis not present

## 2021-10-15 DIAGNOSIS — Z9181 History of falling: Secondary | ICD-10-CM | POA: Diagnosis not present

## 2021-10-15 DIAGNOSIS — M6281 Muscle weakness (generalized): Secondary | ICD-10-CM | POA: Diagnosis not present

## 2021-10-16 DIAGNOSIS — M6281 Muscle weakness (generalized): Secondary | ICD-10-CM | POA: Diagnosis not present

## 2021-10-16 DIAGNOSIS — R1311 Dysphagia, oral phase: Secondary | ICD-10-CM | POA: Diagnosis not present

## 2021-10-16 DIAGNOSIS — R293 Abnormal posture: Secondary | ICD-10-CM | POA: Diagnosis not present

## 2021-10-16 DIAGNOSIS — Z9181 History of falling: Secondary | ICD-10-CM | POA: Diagnosis not present

## 2021-10-16 DIAGNOSIS — I639 Cerebral infarction, unspecified: Secondary | ICD-10-CM | POA: Diagnosis not present

## 2021-10-16 DIAGNOSIS — R1310 Dysphagia, unspecified: Secondary | ICD-10-CM | POA: Diagnosis not present

## 2021-10-16 DIAGNOSIS — R2681 Unsteadiness on feet: Secondary | ICD-10-CM | POA: Diagnosis not present

## 2021-10-17 DIAGNOSIS — R791 Abnormal coagulation profile: Secondary | ICD-10-CM | POA: Diagnosis not present

## 2021-10-17 DIAGNOSIS — Z7901 Long term (current) use of anticoagulants: Secondary | ICD-10-CM | POA: Diagnosis not present

## 2021-10-20 DIAGNOSIS — R1310 Dysphagia, unspecified: Secondary | ICD-10-CM | POA: Diagnosis not present

## 2021-10-20 DIAGNOSIS — R293 Abnormal posture: Secondary | ICD-10-CM | POA: Diagnosis not present

## 2021-10-20 DIAGNOSIS — Z9181 History of falling: Secondary | ICD-10-CM | POA: Diagnosis not present

## 2021-10-20 DIAGNOSIS — I639 Cerebral infarction, unspecified: Secondary | ICD-10-CM | POA: Diagnosis not present

## 2021-10-20 DIAGNOSIS — R2681 Unsteadiness on feet: Secondary | ICD-10-CM | POA: Diagnosis not present

## 2021-10-20 DIAGNOSIS — M6281 Muscle weakness (generalized): Secondary | ICD-10-CM | POA: Diagnosis not present

## 2021-10-20 DIAGNOSIS — R1311 Dysphagia, oral phase: Secondary | ICD-10-CM | POA: Diagnosis not present

## 2021-10-22 DIAGNOSIS — R1311 Dysphagia, oral phase: Secondary | ICD-10-CM | POA: Diagnosis not present

## 2021-10-22 DIAGNOSIS — R2681 Unsteadiness on feet: Secondary | ICD-10-CM | POA: Diagnosis not present

## 2021-10-22 DIAGNOSIS — Z9181 History of falling: Secondary | ICD-10-CM | POA: Diagnosis not present

## 2021-10-22 DIAGNOSIS — R293 Abnormal posture: Secondary | ICD-10-CM | POA: Diagnosis not present

## 2021-10-22 DIAGNOSIS — I639 Cerebral infarction, unspecified: Secondary | ICD-10-CM | POA: Diagnosis not present

## 2021-10-22 DIAGNOSIS — R1310 Dysphagia, unspecified: Secondary | ICD-10-CM | POA: Diagnosis not present

## 2021-10-22 DIAGNOSIS — M6281 Muscle weakness (generalized): Secondary | ICD-10-CM | POA: Diagnosis not present

## 2021-10-24 DIAGNOSIS — Z9181 History of falling: Secondary | ICD-10-CM | POA: Diagnosis not present

## 2021-10-24 DIAGNOSIS — I639 Cerebral infarction, unspecified: Secondary | ICD-10-CM | POA: Diagnosis not present

## 2021-10-24 DIAGNOSIS — M6281 Muscle weakness (generalized): Secondary | ICD-10-CM | POA: Diagnosis not present

## 2021-10-24 DIAGNOSIS — R1310 Dysphagia, unspecified: Secondary | ICD-10-CM | POA: Diagnosis not present

## 2021-10-24 DIAGNOSIS — R2681 Unsteadiness on feet: Secondary | ICD-10-CM | POA: Diagnosis not present

## 2021-10-24 DIAGNOSIS — R1311 Dysphagia, oral phase: Secondary | ICD-10-CM | POA: Diagnosis not present

## 2021-10-24 DIAGNOSIS — R293 Abnormal posture: Secondary | ICD-10-CM | POA: Diagnosis not present

## 2021-10-27 DIAGNOSIS — M6281 Muscle weakness (generalized): Secondary | ICD-10-CM | POA: Diagnosis not present

## 2021-10-27 DIAGNOSIS — R2681 Unsteadiness on feet: Secondary | ICD-10-CM | POA: Diagnosis not present

## 2021-10-27 DIAGNOSIS — R293 Abnormal posture: Secondary | ICD-10-CM | POA: Diagnosis not present

## 2021-10-27 DIAGNOSIS — R1311 Dysphagia, oral phase: Secondary | ICD-10-CM | POA: Diagnosis not present

## 2021-10-27 DIAGNOSIS — I639 Cerebral infarction, unspecified: Secondary | ICD-10-CM | POA: Diagnosis not present

## 2021-10-27 DIAGNOSIS — Z9181 History of falling: Secondary | ICD-10-CM | POA: Diagnosis not present

## 2021-10-27 DIAGNOSIS — R1310 Dysphagia, unspecified: Secondary | ICD-10-CM | POA: Diagnosis not present

## 2021-10-28 DIAGNOSIS — R1310 Dysphagia, unspecified: Secondary | ICD-10-CM | POA: Diagnosis not present

## 2021-10-28 DIAGNOSIS — R293 Abnormal posture: Secondary | ICD-10-CM | POA: Diagnosis not present

## 2021-10-28 DIAGNOSIS — Z9181 History of falling: Secondary | ICD-10-CM | POA: Diagnosis not present

## 2021-10-28 DIAGNOSIS — I639 Cerebral infarction, unspecified: Secondary | ICD-10-CM | POA: Diagnosis not present

## 2021-10-28 DIAGNOSIS — R2681 Unsteadiness on feet: Secondary | ICD-10-CM | POA: Diagnosis not present

## 2021-10-28 DIAGNOSIS — M6281 Muscle weakness (generalized): Secondary | ICD-10-CM | POA: Diagnosis not present

## 2021-10-28 DIAGNOSIS — R1311 Dysphagia, oral phase: Secondary | ICD-10-CM | POA: Diagnosis not present

## 2021-10-29 DIAGNOSIS — R1311 Dysphagia, oral phase: Secondary | ICD-10-CM | POA: Diagnosis not present

## 2021-10-29 DIAGNOSIS — R1310 Dysphagia, unspecified: Secondary | ICD-10-CM | POA: Diagnosis not present

## 2021-10-29 DIAGNOSIS — R293 Abnormal posture: Secondary | ICD-10-CM | POA: Diagnosis not present

## 2021-10-29 DIAGNOSIS — I639 Cerebral infarction, unspecified: Secondary | ICD-10-CM | POA: Diagnosis not present

## 2021-10-29 DIAGNOSIS — Z9181 History of falling: Secondary | ICD-10-CM | POA: Diagnosis not present

## 2021-10-29 DIAGNOSIS — R2681 Unsteadiness on feet: Secondary | ICD-10-CM | POA: Diagnosis not present

## 2021-10-29 DIAGNOSIS — M6281 Muscle weakness (generalized): Secondary | ICD-10-CM | POA: Diagnosis not present

## 2021-10-30 DIAGNOSIS — E1151 Type 2 diabetes mellitus with diabetic peripheral angiopathy without gangrene: Secondary | ICD-10-CM | POA: Diagnosis not present

## 2021-10-30 DIAGNOSIS — R2681 Unsteadiness on feet: Secondary | ICD-10-CM | POA: Diagnosis not present

## 2021-10-30 DIAGNOSIS — L602 Onychogryphosis: Secondary | ICD-10-CM | POA: Diagnosis not present

## 2021-10-30 DIAGNOSIS — M6281 Muscle weakness (generalized): Secondary | ICD-10-CM | POA: Diagnosis not present

## 2021-10-30 DIAGNOSIS — R1310 Dysphagia, unspecified: Secondary | ICD-10-CM | POA: Diagnosis not present

## 2021-10-30 DIAGNOSIS — R293 Abnormal posture: Secondary | ICD-10-CM | POA: Diagnosis not present

## 2021-10-30 DIAGNOSIS — I639 Cerebral infarction, unspecified: Secondary | ICD-10-CM | POA: Diagnosis not present

## 2021-10-30 DIAGNOSIS — R1311 Dysphagia, oral phase: Secondary | ICD-10-CM | POA: Diagnosis not present

## 2021-10-30 DIAGNOSIS — Z9181 History of falling: Secondary | ICD-10-CM | POA: Diagnosis not present

## 2021-10-31 DIAGNOSIS — R1310 Dysphagia, unspecified: Secondary | ICD-10-CM | POA: Diagnosis not present

## 2021-10-31 DIAGNOSIS — R2681 Unsteadiness on feet: Secondary | ICD-10-CM | POA: Diagnosis not present

## 2021-10-31 DIAGNOSIS — R293 Abnormal posture: Secondary | ICD-10-CM | POA: Diagnosis not present

## 2021-10-31 DIAGNOSIS — M6281 Muscle weakness (generalized): Secondary | ICD-10-CM | POA: Diagnosis not present

## 2021-10-31 DIAGNOSIS — I639 Cerebral infarction, unspecified: Secondary | ICD-10-CM | POA: Diagnosis not present

## 2021-10-31 DIAGNOSIS — R1311 Dysphagia, oral phase: Secondary | ICD-10-CM | POA: Diagnosis not present

## 2021-10-31 DIAGNOSIS — Z9181 History of falling: Secondary | ICD-10-CM | POA: Diagnosis not present

## 2021-11-04 DIAGNOSIS — R1311 Dysphagia, oral phase: Secondary | ICD-10-CM | POA: Diagnosis not present

## 2021-11-04 DIAGNOSIS — R293 Abnormal posture: Secondary | ICD-10-CM | POA: Diagnosis not present

## 2021-11-04 DIAGNOSIS — Z9181 History of falling: Secondary | ICD-10-CM | POA: Diagnosis not present

## 2021-11-04 DIAGNOSIS — R1310 Dysphagia, unspecified: Secondary | ICD-10-CM | POA: Diagnosis not present

## 2021-11-04 DIAGNOSIS — I639 Cerebral infarction, unspecified: Secondary | ICD-10-CM | POA: Diagnosis not present

## 2021-11-04 DIAGNOSIS — M6281 Muscle weakness (generalized): Secondary | ICD-10-CM | POA: Diagnosis not present

## 2021-11-04 DIAGNOSIS — R2681 Unsteadiness on feet: Secondary | ICD-10-CM | POA: Diagnosis not present

## 2021-11-13 DIAGNOSIS — Z7901 Long term (current) use of anticoagulants: Secondary | ICD-10-CM | POA: Diagnosis not present

## 2021-11-27 DIAGNOSIS — Z7901 Long term (current) use of anticoagulants: Secondary | ICD-10-CM | POA: Diagnosis not present

## 2021-12-05 DIAGNOSIS — Z7901 Long term (current) use of anticoagulants: Secondary | ICD-10-CM | POA: Diagnosis not present

## 2022-03-24 IMAGING — MR MR HEAD W/O CM
7 of 11 series · 25 of 48 positions shown · non-contrast
Comparison: MRI 08/10/2019.

CLINICAL DATA: Stroke with worsening symptoms.  Recent stroke.

EXAM:
MRI HEAD WITHOUT CONTRAST
TECHNIQUE: Multiplanar, multiecho pulse sequences of the brain and surrounding
structures were obtained without intravenous contrast.

[Series 2: DWI · axial · 3.0mm · 0.94mm/px · z∈[-94,+59]mm · 8 of 106 slices shown (1 of 2)]
[im 1/106]
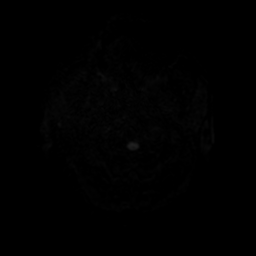
[im 16/106]
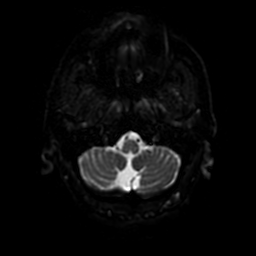
[im 31/106]
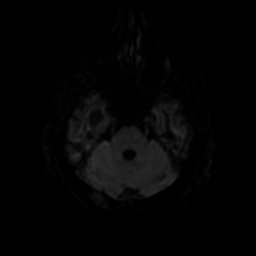
[im 46/106]
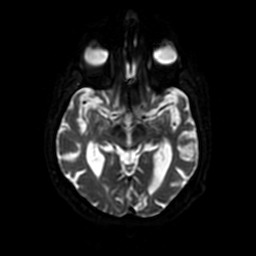
[im 61/106]
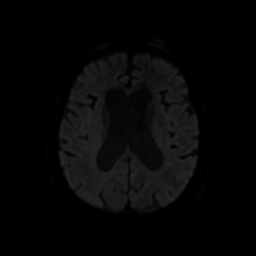
[im 76/106]
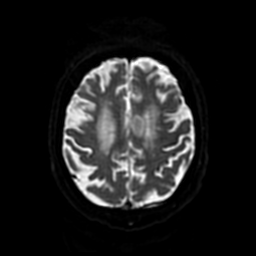
[im 91/106]
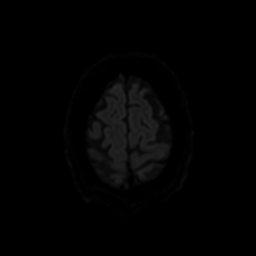
[im 106/106]
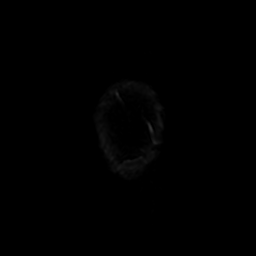

[Series 3: DWI · coronal · 4.0mm · 0.94mm/px · 5 of 74 slices shown (2 of 2)]
[im 1/74]
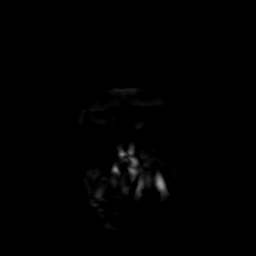
[im 19/74]
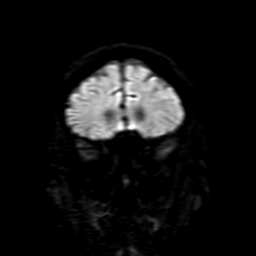
[im 37/74]
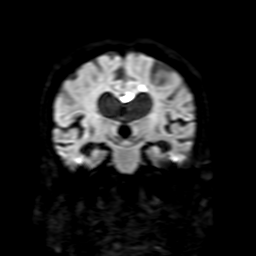
[im 55/74]
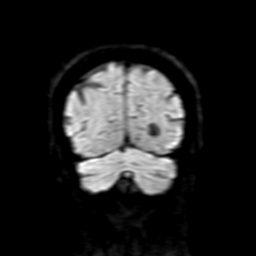
[im 74/74]
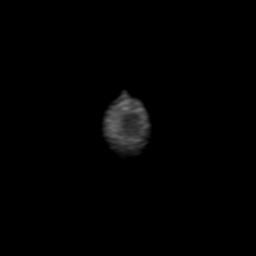

[Series 4: FLAIR · sagittal · 5.0mm · 0.23mm/px · 2 of 26 slices shown (1 of 2)]
[im 1/26]
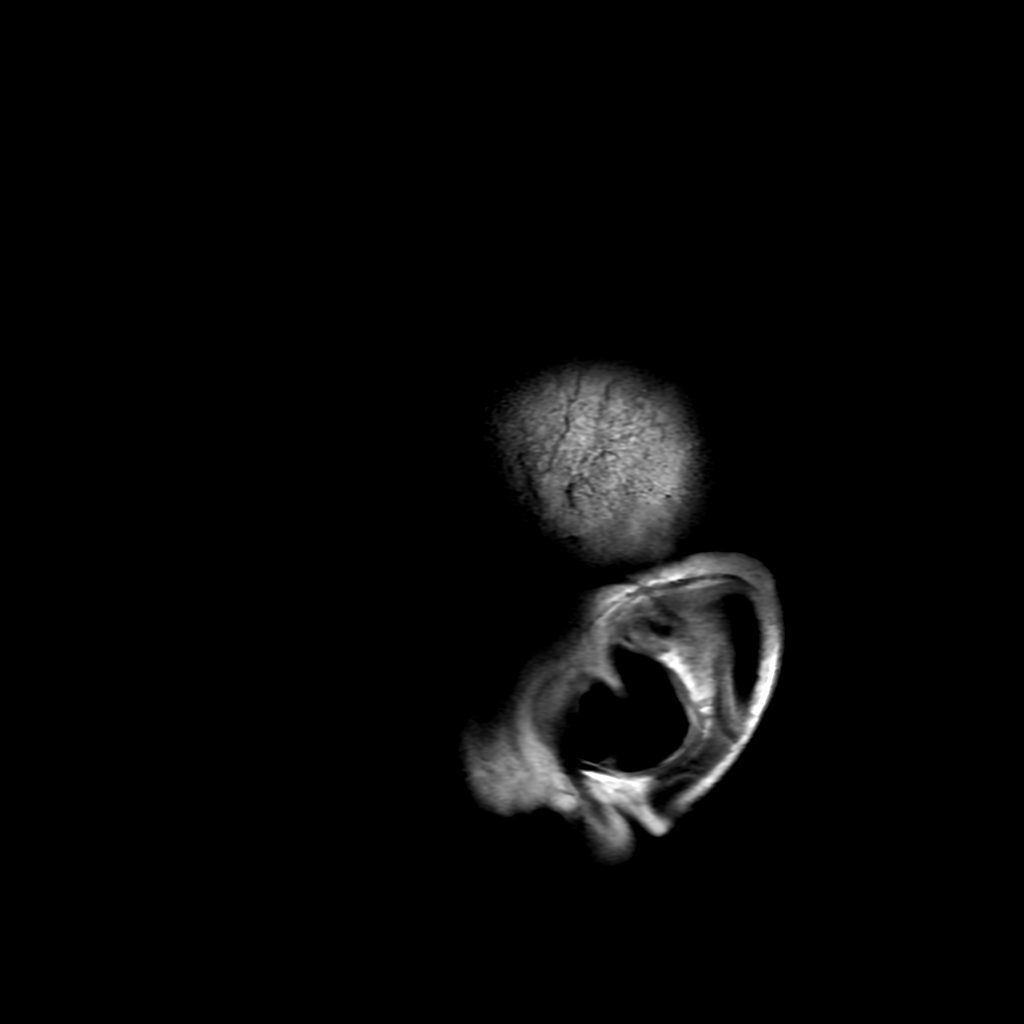
[im 26/26]
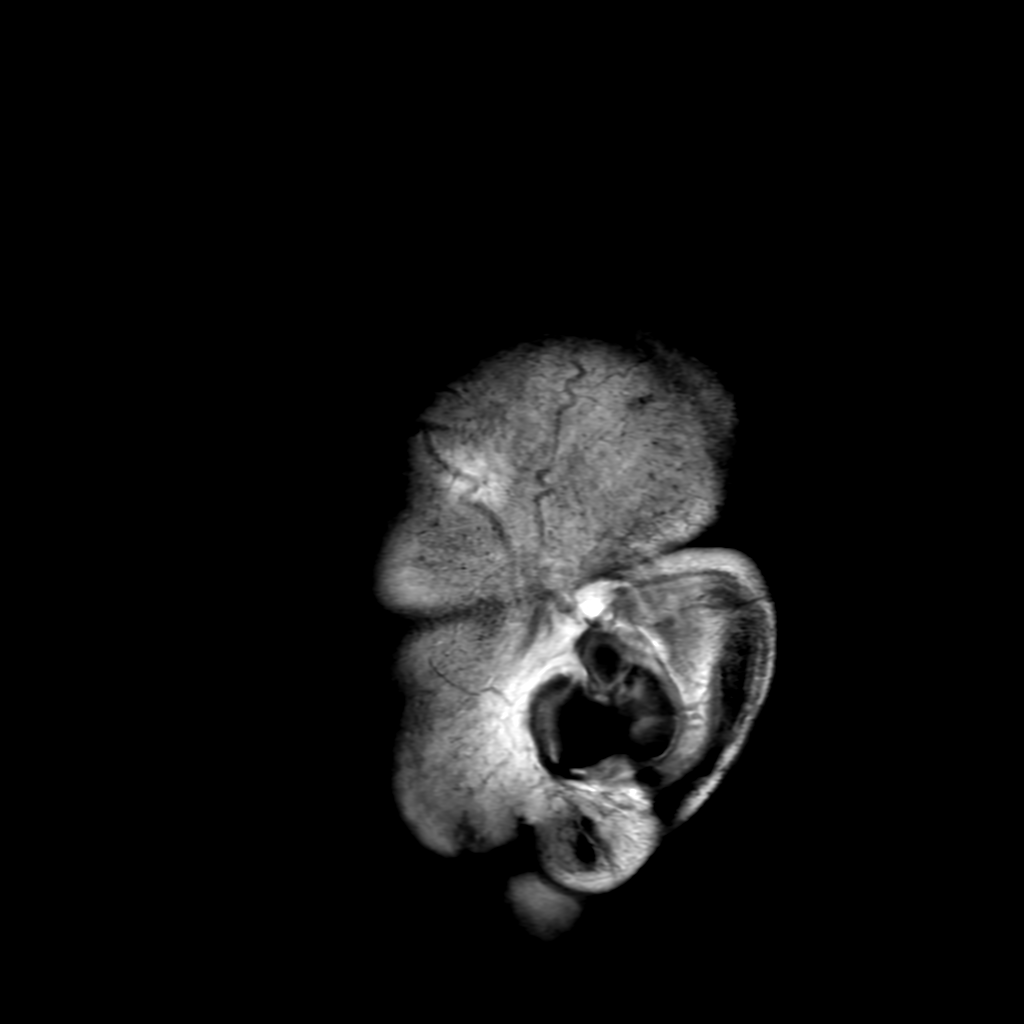

[Series 6: FLAIR · axial · 3.0mm · 0.41mm/px · z∈[-89,+63]mm · 2 of 27 slices shown (2 of 2)]
[im 1/27]
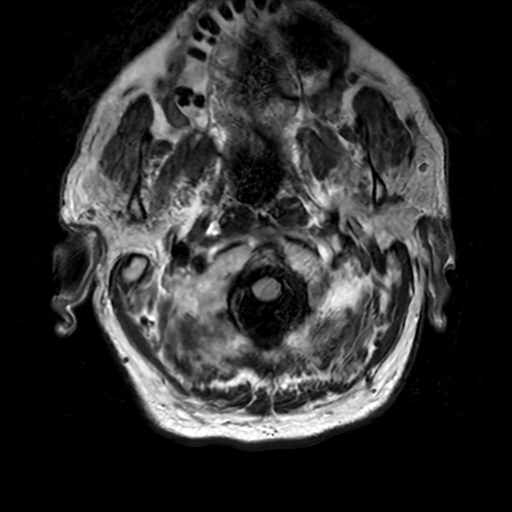
[im 27/27]
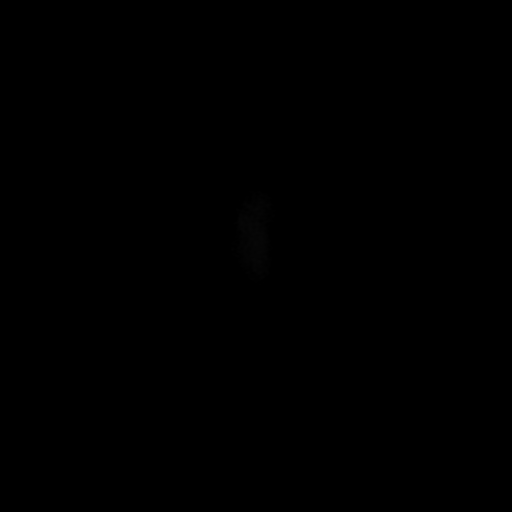

[Series 9: T2 · coronal · 5.0mm · 0.20mm/px · 1 of 30 slices shown]
[im 1/30]
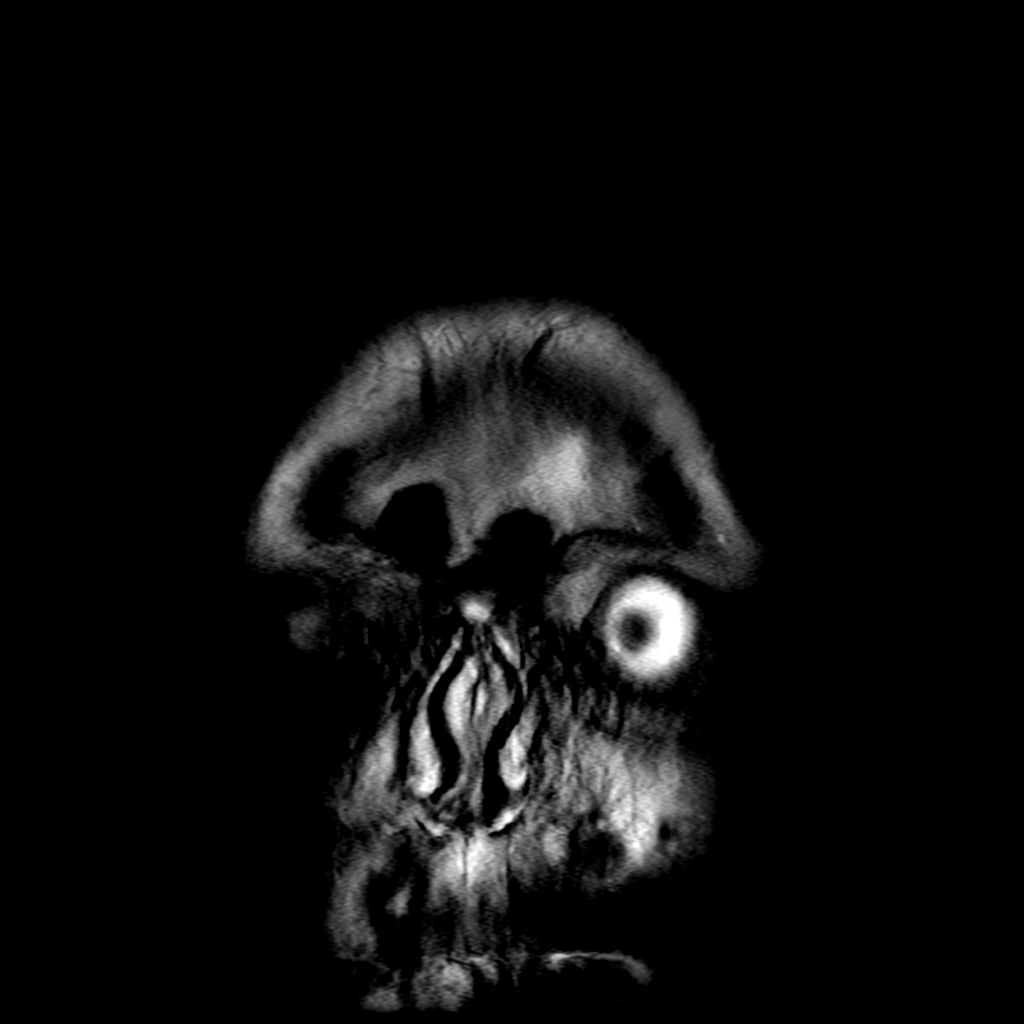

[Series 250: ADC · axial · 3.0mm · 0.94mm/px · z∈[-94,+59]mm · 4 of 53 slices shown (1 of 2)]
[im 1/53]
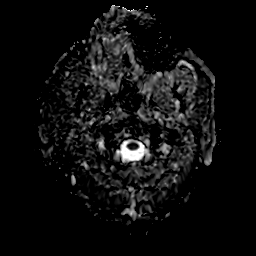
[im 18/53]
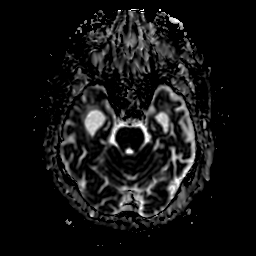
[im 35/53]
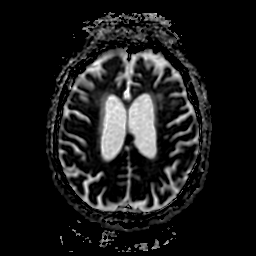
[im 53/53]
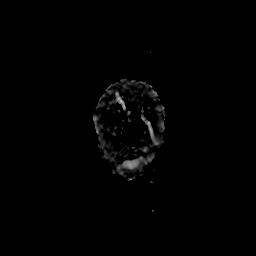

[Series 350: ADC · coronal · 4.0mm · 0.94mm/px · 3 of 37 slices shown (2 of 2)]
[im 1/37]
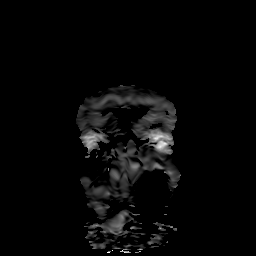
[im 19/37]
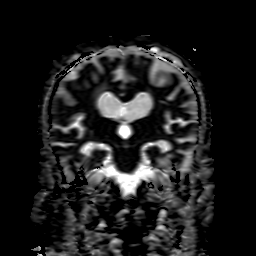
[im 37/37]
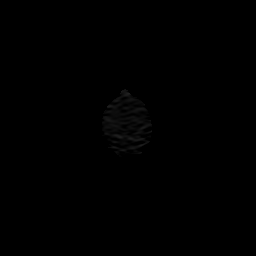

[25 of 48 positions shown; findings below may reference images not displayed]

FINDINGS: Brain: Interval extension of left anterior cerebral artery territory
infarct. Large area of infarct now in the body of the corpus
callosum on the left. There is also infarct in the left posterior
frontal medial lobe compatible with distal left MCA infarct. Note is
made of severe stenosis left A3 segment on CTA.

Generalized atrophy which is severe in the temporal lobes.
Ventricular enlargement consistent with volume loss in the brain.
Negative for hemorrhage or mass lesion.

Vascular: Normal arterial flow voids

Skull and upper cervical spine: No focal skeletal lesion.

Sinuses/Orbits: Paranasal sinuses clear.  No orbital lesion

Other: None
IMPRESSION: Left anterior cerebral artery territory infarct has progressed since
the recent MRI of 08/10/2019. No associated hemorrhage

Generalized atrophy which is severe in the temporal lobes. Chronic
microvascular ischemic change in the white matter.

## 2023-02-14 ENCOUNTER — Encounter (HOSPITAL_COMMUNITY): Payer: Self-pay

## 2023-02-14 ENCOUNTER — Emergency Department (HOSPITAL_COMMUNITY): Payer: Medicare Other

## 2023-02-14 ENCOUNTER — Other Ambulatory Visit: Payer: Self-pay

## 2023-02-14 ENCOUNTER — Inpatient Hospital Stay (HOSPITAL_COMMUNITY)
Admission: EM | Admit: 2023-02-14 | Discharge: 2023-02-18 | DRG: 177 | Disposition: A | Discharge status: Hospice | Payer: Medicare Other | Source: Skilled Nursing Facility | Attending: Internal Medicine | Admitting: Internal Medicine

## 2023-02-14 DIAGNOSIS — I4891 Unspecified atrial fibrillation: Secondary | ICD-10-CM

## 2023-02-14 DIAGNOSIS — F0390 Unspecified dementia without behavioral disturbance: Secondary | ICD-10-CM | POA: Diagnosis present

## 2023-02-14 DIAGNOSIS — Z1152 Encounter for screening for COVID-19: Secondary | ICD-10-CM

## 2023-02-14 DIAGNOSIS — E039 Hypothyroidism, unspecified: Secondary | ICD-10-CM | POA: Diagnosis present

## 2023-02-14 DIAGNOSIS — Z79890 Hormone replacement therapy (postmenopausal): Secondary | ICD-10-CM | POA: Diagnosis not present

## 2023-02-14 DIAGNOSIS — E669 Obesity, unspecified: Secondary | ICD-10-CM | POA: Diagnosis present

## 2023-02-14 DIAGNOSIS — Z7901 Long term (current) use of anticoagulants: Secondary | ICD-10-CM

## 2023-02-14 DIAGNOSIS — I429 Cardiomyopathy, unspecified: Secondary | ICD-10-CM | POA: Diagnosis present

## 2023-02-14 DIAGNOSIS — I509 Heart failure, unspecified: Secondary | ICD-10-CM

## 2023-02-14 DIAGNOSIS — Z66 Do not resuscitate: Secondary | ICD-10-CM | POA: Diagnosis present

## 2023-02-14 DIAGNOSIS — I350 Nonrheumatic aortic (valve) stenosis: Secondary | ICD-10-CM | POA: Diagnosis not present

## 2023-02-14 DIAGNOSIS — E66811 Obesity, class 1: Secondary | ICD-10-CM | POA: Diagnosis present

## 2023-02-14 DIAGNOSIS — I10 Essential (primary) hypertension: Secondary | ICD-10-CM | POA: Diagnosis present

## 2023-02-14 DIAGNOSIS — I110 Hypertensive heart disease with heart failure: Secondary | ICD-10-CM | POA: Diagnosis present

## 2023-02-14 DIAGNOSIS — Z515 Encounter for palliative care: Secondary | ICD-10-CM | POA: Diagnosis not present

## 2023-02-14 DIAGNOSIS — Z7982 Long term (current) use of aspirin: Secondary | ICD-10-CM

## 2023-02-14 DIAGNOSIS — D6861 Antiphospholipid syndrome: Secondary | ICD-10-CM | POA: Diagnosis present

## 2023-02-14 DIAGNOSIS — J9601 Acute respiratory failure with hypoxia: Secondary | ICD-10-CM | POA: Diagnosis present

## 2023-02-14 DIAGNOSIS — I080 Rheumatic disorders of both mitral and aortic valves: Secondary | ICD-10-CM | POA: Diagnosis present

## 2023-02-14 DIAGNOSIS — Z7902 Long term (current) use of antithrombotics/antiplatelets: Secondary | ICD-10-CM

## 2023-02-14 DIAGNOSIS — J9811 Atelectasis: Secondary | ICD-10-CM | POA: Diagnosis present

## 2023-02-14 DIAGNOSIS — Z7189 Other specified counseling: Secondary | ICD-10-CM | POA: Diagnosis not present

## 2023-02-14 DIAGNOSIS — I2510 Atherosclerotic heart disease of native coronary artery without angina pectoris: Secondary | ICD-10-CM | POA: Diagnosis present

## 2023-02-14 DIAGNOSIS — Z8249 Family history of ischemic heart disease and other diseases of the circulatory system: Secondary | ICD-10-CM

## 2023-02-14 DIAGNOSIS — Z794 Long term (current) use of insulin: Secondary | ICD-10-CM

## 2023-02-14 DIAGNOSIS — Z6831 Body mass index (BMI) 31.0-31.9, adult: Secondary | ICD-10-CM

## 2023-02-14 DIAGNOSIS — I5033 Acute on chronic diastolic (congestive) heart failure: Secondary | ICD-10-CM | POA: Diagnosis not present

## 2023-02-14 DIAGNOSIS — Z86711 Personal history of pulmonary embolism: Secondary | ICD-10-CM | POA: Diagnosis not present

## 2023-02-14 DIAGNOSIS — E119 Type 2 diabetes mellitus without complications: Secondary | ICD-10-CM | POA: Diagnosis present

## 2023-02-14 DIAGNOSIS — Y95 Nosocomial condition: Secondary | ICD-10-CM | POA: Diagnosis present

## 2023-02-14 DIAGNOSIS — Z8673 Personal history of transient ischemic attack (TIA), and cerebral infarction without residual deficits: Secondary | ICD-10-CM

## 2023-02-14 DIAGNOSIS — Z7984 Long term (current) use of oral hypoglycemic drugs: Secondary | ICD-10-CM

## 2023-02-14 DIAGNOSIS — I480 Paroxysmal atrial fibrillation: Principal | ICD-10-CM | POA: Diagnosis present

## 2023-02-14 DIAGNOSIS — F17210 Nicotine dependence, cigarettes, uncomplicated: Secondary | ICD-10-CM | POA: Diagnosis present

## 2023-02-14 DIAGNOSIS — J690 Pneumonitis due to inhalation of food and vomit: Secondary | ICD-10-CM | POA: Diagnosis not present

## 2023-02-14 DIAGNOSIS — I5043 Acute on chronic combined systolic (congestive) and diastolic (congestive) heart failure: Secondary | ICD-10-CM | POA: Diagnosis present

## 2023-02-14 DIAGNOSIS — E872 Acidosis, unspecified: Secondary | ICD-10-CM | POA: Diagnosis present

## 2023-02-14 DIAGNOSIS — I252 Old myocardial infarction: Secondary | ICD-10-CM

## 2023-02-14 DIAGNOSIS — Z7952 Long term (current) use of systemic steroids: Secondary | ICD-10-CM

## 2023-02-14 DIAGNOSIS — R7989 Other specified abnormal findings of blood chemistry: Principal | ICD-10-CM

## 2023-02-14 DIAGNOSIS — I2489 Other forms of acute ischemic heart disease: Secondary | ICD-10-CM | POA: Diagnosis present

## 2023-02-14 DIAGNOSIS — Z823 Family history of stroke: Secondary | ICD-10-CM

## 2023-02-14 DIAGNOSIS — Z79899 Other long term (current) drug therapy: Secondary | ICD-10-CM

## 2023-02-14 DIAGNOSIS — R000 Tachycardia, unspecified: Secondary | ICD-10-CM | POA: Diagnosis present

## 2023-02-14 DIAGNOSIS — N390 Urinary tract infection, site not specified: Secondary | ICD-10-CM | POA: Diagnosis present

## 2023-02-14 DIAGNOSIS — J189 Pneumonia, unspecified organism: Secondary | ICD-10-CM

## 2023-02-14 LAB — URINALYSIS, W/ REFLEX TO CULTURE (INFECTION SUSPECTED)
Bilirubin Urine: NEGATIVE
Glucose, UA: NEGATIVE mg/dL
Ketones, ur: NEGATIVE mg/dL
Nitrite: POSITIVE — AB
Protein, ur: 30 mg/dL — AB
RBC / HPF: >50 RBC/hpf (ref 0–5)
Specific Gravity, Urine: 1.012 (ref 1.005–1.030)
WBC, UA: >50 WBC/hpf (ref 0–5)
pH: 5 (ref 5.0–8.0)

## 2023-02-14 LAB — I-STAT VENOUS BLOOD GAS, ED
Acid-base deficit: 1 mmol/L (ref 0.0–2.0)
Bicarbonate: 23.3 mmol/L (ref 20.0–28.0)
Calcium, Ion: 1.17 mmol/L (ref 1.15–1.40)
HCT: 41 % (ref 39.0–52.0)
Hemoglobin: 13.9 g/dL (ref 13.0–17.0)
O2 Saturation: 95 %
Potassium: 3.5 mmol/L (ref 3.5–5.1)
Sodium: 138 mmol/L (ref 135–145)
TCO2: 24 mmol/L (ref 22–32)
pCO2, Ven: 37.6 mm[Hg] — ABNORMAL LOW (ref 44–60)
pH, Ven: 7.399 (ref 7.25–7.43)
pO2, Ven: 73 mm[Hg] — ABNORMAL HIGH (ref 32–45)

## 2023-02-14 LAB — RESP PANEL BY RT-PCR (RSV, FLU A&B, COVID)  RVPGX2
Influenza A by PCR: NEGATIVE
Influenza B by PCR: NEGATIVE
Resp Syncytial Virus by PCR: NEGATIVE
SARS Coronavirus 2 by RT PCR: NEGATIVE

## 2023-02-14 LAB — CBC WITH DIFFERENTIAL/PLATELET
Abs Immature Granulocytes: 0.04 10*3/uL (ref 0.00–0.07)
Basophils Absolute: 0 10*3/uL (ref 0.0–0.1)
Basophils Relative: 0 %
Eosinophils Absolute: 0 10*3/uL (ref 0.0–0.5)
Eosinophils Relative: 0 %
HCT: 40.2 % (ref 39.0–52.0)
Hemoglobin: 13 g/dL (ref 13.0–17.0)
Immature Granulocytes: 1 %
Lymphocytes Relative: 5 %
Lymphs Abs: 0.4 10*3/uL — ABNORMAL LOW (ref 0.7–4.0)
MCH: 28.9 pg (ref 26.0–34.0)
MCHC: 32.3 g/dL (ref 30.0–36.0)
MCV: 89.3 fL (ref 80.0–100.0)
Monocytes Absolute: 0.1 10*3/uL (ref 0.1–1.0)
Monocytes Relative: 1 %
Neutro Abs: 7.2 10*3/uL (ref 1.7–7.7)
Neutrophils Relative %: 93 %
Platelets: 221 10*3/uL (ref 150–400)
RBC: 4.5 MIL/uL (ref 4.22–5.81)
RDW: 15.5 % (ref 11.5–15.5)
WBC: 7.8 10*3/uL (ref 4.0–10.5)
nRBC: 0 % (ref 0.0–0.2)

## 2023-02-14 LAB — I-STAT CG4 LACTIC ACID, ED: Lactic Acid, Venous: 5 mmol/L (ref 0.5–1.9)

## 2023-02-14 LAB — COMPREHENSIVE METABOLIC PANEL
ALT: 20 U/L (ref 0–44)
AST: 35 U/L (ref 15–41)
Albumin: 3.3 g/dL — ABNORMAL LOW (ref 3.5–5.0)
Alkaline Phosphatase: 78 U/L (ref 38–126)
Anion gap: 13 (ref 5–15)
BUN: 17 mg/dL (ref 8–23)
CO2: 23 mmol/L (ref 22–32)
Calcium: 9.3 mg/dL (ref 8.9–10.3)
Chloride: 101 mmol/L (ref 98–111)
Creatinine, Ser: 1.15 mg/dL (ref 0.61–1.24)
GFR, Estimated: >60 mL/min (ref >60)
Glucose, Bld: 206 mg/dL — ABNORMAL HIGH (ref 70–99)
Potassium: 3.5 mmol/L (ref 3.5–5.1)
Sodium: 137 mmol/L (ref 135–145)
Total Bilirubin: 1 mg/dL (ref 0.3–1.2)
Total Protein: 7.1 g/dL (ref 6.5–8.1)

## 2023-02-14 LAB — I-STAT CHEM 8, ED
BUN: 20 mg/dL (ref 8–23)
Calcium, Ion: 1.17 mmol/L (ref 1.15–1.40)
Chloride: 103 mmol/L (ref 98–111)
Creatinine, Ser: 0.9 mg/dL (ref 0.61–1.24)
Glucose, Bld: 211 mg/dL — ABNORMAL HIGH (ref 70–99)
HCT: 40 % (ref 39.0–52.0)
Hemoglobin: 13.6 g/dL (ref 13.0–17.0)
Potassium: 3.4 mmol/L — ABNORMAL LOW (ref 3.5–5.1)
Sodium: 137 mmol/L (ref 135–145)
TCO2: 23 mmol/L (ref 22–32)

## 2023-02-14 LAB — BRAIN NATRIURETIC PEPTIDE: B Natriuretic Peptide: 684.2 pg/mL — ABNORMAL HIGH (ref 0.0–100.0)

## 2023-02-14 LAB — TROPONIN I (HIGH SENSITIVITY)
Troponin I (High Sensitivity): 265 ng/L (ref <18)
Troponin I (High Sensitivity): 219 ng/L (ref <18)

## 2023-02-14 LAB — PROTIME-INR
INR: 2.3 — ABNORMAL HIGH (ref 0.8–1.2)
Prothrombin Time: 25.2 s — ABNORMAL HIGH (ref 11.4–15.2)

## 2023-02-14 MED ORDER — ACETAMINOPHEN 325 MG PO TABS
650.0000 mg | ORAL_TABLET | ORAL | Status: DC | PRN
  Administered 2023-02-15: 650 mg via ORAL
  Filled 2023-02-14: qty 2

## 2023-02-14 MED ORDER — DILTIAZEM HCL-DEXTROSE 125-5 MG/125ML-% IV SOLN (PREMIX)
5.0000 mg/h | INTRAVENOUS | Status: DC
  Administered 2023-02-14: 5 mg/h via INTRAVENOUS
  Filled 2023-02-14: qty 125

## 2023-02-14 MED ORDER — DILTIAZEM LOAD VIA INFUSION
10.0000 mg | Freq: Once | INTRAVENOUS | Status: AC
  Administered 2023-02-14: 10 mg via INTRAVENOUS
  Filled 2023-02-14: qty 10

## 2023-02-14 MED ORDER — POTASSIUM CHLORIDE 10 MEQ/100ML IV SOLN
10.0000 meq | INTRAVENOUS | Status: AC
  Administered 2023-02-14 – 2023-02-15 (×3): 10 meq via INTRAVENOUS
  Filled 2023-02-14 (×2): qty 100

## 2023-02-14 MED ORDER — SODIUM CHLORIDE 0.9 % IV SOLN
1.0000 g | Freq: Once | INTRAVENOUS | Status: AC
  Administered 2023-02-14: 1 g via INTRAVENOUS
  Filled 2023-02-14: qty 10

## 2023-02-14 MED ORDER — FUROSEMIDE 10 MG/ML IJ SOLN
40.0000 mg | Freq: Once | INTRAMUSCULAR | Status: AC
  Administered 2023-02-14: 40 mg via INTRAVENOUS
  Filled 2023-02-14: qty 4

## 2023-02-14 MED ORDER — ONDANSETRON HCL 4 MG/2ML IJ SOLN: 4.0000 mg | Freq: Four times a day (QID) | INTRAMUSCULAR | Status: DC | PRN

## 2023-02-14 MED ORDER — DEXTROSE 5 % IV SOLN
500.0000 mg | Freq: Once | INTRAVENOUS | Status: AC
  Administered 2023-02-14: 500 mg via INTRAVENOUS
  Filled 2023-02-14: qty 5

## 2023-02-14 NOTE — H&P (Addendum)
History and Physical    Patient: Roger Ramirez ZOX:[id number] DOB: 1945-05-07 DOA: 02/14/2023 DOS: the patient was seen and examined on 02/14/2023 PCP: Renford Dills, MD  Patient coming from: SNF  Chief Complaint: No chief complaint on file.  HPI: Roger Ramirez is a 77 y.o. male with medical history significant for history of PE and antiphospholipid syndrome on Coumadin, history of 2 large strokes, history of MI, moderate aortic stenosis and dementia who was sent in from the nursing home tonight because of possible aspiration pneumonia.  In the emergency department as s he was being evaluated he was found to have A-fib with RVR.    There is some concern for aspiration pneumonia versus CHF.  Once the patient was started on a Cardizem drip the hospitalist were asked to admit.  Cardiology was consulted by the emergency department provider.    Review of Systems: unable to review all systems due to the inability of the patient to answer questions. Past Medical History:  Diagnosis Date   Antiphospholipid syndrome (HCC)    Cognitive deficits    CVA (cerebral vascular accident) (HCC) 2020   2020 and 07/2019   Diabetes mellitus (HCC)    Hypertension    Hypothyroidism    Moderate aortic stenosis    Pulmonary embolism (HCC)    Tobacco abuse    History reviewed. No pertinent surgical history. Social History:  reports that he has quit smoking. His smoking use included cigarettes. He has a 20 pack-year smoking history. He has never used smokeless tobacco. He reports that he does not currently use alcohol. He reports that he does not use drugs.  No Known Allergies  Family History  Problem Relation Age of Onset   Hypertension Mother    Hypertension Father    Dementia Maternal Grandmother    Stroke Maternal Grandmother     Prior to Admission medications   Medication Sig Start Date End Date Taking? Authorizing Provider  acetaminophen (TYLENOL) 325 MG tablet Take 650 mg by  mouth every 6 (six) hours as needed for mild pain (pain score 1-3) or moderate pain (pain score 4-6) (For general discomfort).   Yes [provider]  furosemide (LASIX) 40 MG tablet Take 40 mg by mouth daily. 02/14/23  Yes [provider]  metFORMIN (GLUCOPHAGE-XR) 750 MG 24 hr tablet Take 750 mg by mouth daily. 01/18/23  Yes [provider]  predniSONE (DELTASONE) 20 MG tablet Take 20 mg by mouth 2 (two) times daily. 02/14/23  Yes [provider]  spironolactone (ALDACTONE) 25 MG tablet Take 25 mg by mouth daily. 02/14/23  Yes [provider]  warfarin (COUMADIN) 5 MG tablet Take 5 mg by mouth daily. 01/13/23  Yes [provider]  atorvastatin (LIPITOR) 40 MG tablet Take 1 tablet (40 mg total) by mouth daily. Patient taking differently: Take 40 mg by mouth at bedtime. 08/17/19   Dorcas Carrow, MD  clopidogrel (PLAVIX) 75 MG tablet Take 1 tablet (75 mg total) by mouth daily. 09/07/19   Angiulli, Mcarthur Rossetti, PA-C  insulin lispro (HUMALOG) 100 UNIT/ML KwikPen Inject 4 Units into the skin 3 (three) times daily before meals. Sliding Scale 05/29/20   [provider]  levothyroxine (SYNTHROID) 75 MCG tablet Take 75 mcg by mouth daily. 05/21/19   [provider]  melatonin 3 MG TABS tablet Take 1 tablet (3 mg total) by mouth at bedtime. 09/06/19   Angiulli, Mcarthur Rossetti, PA-C  metoprolol succinate (TOPROL-XL) 25 MG 24 hr tablet Take 1  tablet (25 mg total) by mouth daily. Patient taking differently: Take 25 mg by mouth in the morning and at bedtime. 09/07/19   Angiulli, Mcarthur Rossetti, PA-C  Multiple Vitamin (MULTIVITAMIN WITH MINERALS) TABS tablet Take 1 tablet by mouth daily. 09/07/19   Angiulli, Mcarthur Rossetti, PA-C  nitroGLYCERIN (NITROSTAT) 0.4 MG SL tablet Place 1 tablet (0.4 mg total) under the tongue every 5 (five) minutes as needed for chest pain. 09/06/19   Angiulli, Mcarthur Rossetti, PA-C  polyethylene glycol (MIRALAX / GLYCOLAX) 17 g packet Take 17 g by mouth  daily.    [provider]  vitamin B-12 1000 MCG tablet Take 1 tablet (1,000 mcg total) by mouth daily. Patient taking differently: Take 500 mcg by mouth daily. 06/08/18   Albertine Grates, MD    Physical Exam: Vitals:   02/14/23 2250 02/14/23 2300 02/14/23 2315 02/14/23 2330  BP: (!) 105/96 108/81 107/81 111/84  Pulse: (!) 124 (!) 29 (!) 29 (!) 31  Resp: (!) 24     Temp:      TempSrc:      SpO2: 100% 95% 96% 96%   Physical Exam:  General: pale, tachypneic, well nourished HEENT: Normocephalic, atraumatic, PER Cardiovascular: Tachycardic, irregular. Distal pulses faint Pulmonary: Tachypneic, no wheezing, faint crackles in the bases posteriorly Gastrointestinal: Nondistended abdomen, soft, non-tender, normoactive bowel sounds Musculoskeletal: trace lower ext edema Skin: Skin is warm and dry, slightly gray Neuro: Follows commands, no facial asymmetry, strength testing deferred because of his tachypnea, HOH PSYCH: cooperative  Data Reviewed:  Results for orders placed or performed during the hospital encounter of 02/14/23 (from the past 24 hour(s))  CBC with Differential     Status: Abnormal   Collection Time: 02/14/23  8:39 PM  Result Value Ref Range   WBC 7.8 4.0 - 10.5 K/uL   RBC 4.50 4.22 - 5.81 MIL/uL   Hemoglobin 13.0 13.0 - 17.0 g/dL   HCT 16.1 09.6 - 04.5 %   MCV 89.3 80.0 - 100.0 fL   MCH 28.9 26.0 - 34.0 pg   MCHC 32.3 30.0 - 36.0 g/dL   RDW 40.9 81.1 - 91.4 %   Platelets 221 150 - 400 K/uL   nRBC 0.0 0.0 - 0.2 %   Neutrophils Relative % 93 %   Neutro Abs 7.2 1.7 - 7.7 K/uL   Lymphocytes Relative 5 %   Lymphs Abs 0.4 (L) 0.7 - 4.0 K/uL   Monocytes Relative 1 %   Monocytes Absolute 0.1 0.1 - 1.0 K/uL   Eosinophils Relative 0 %   Eosinophils Absolute 0.0 0.0 - 0.5 K/uL   Basophils Relative 0 %   Basophils Absolute 0.0 0.0 - 0.1 K/uL   Immature Granulocytes 1 %   Abs Immature Granulocytes 0.04 0.00 - 0.07 K/uL  Comprehensive metabolic panel     Status:  Abnormal   Collection Time: 02/14/23  8:39 PM  Result Value Ref Range   Sodium 137 135 - 145 mmol/L   Potassium 3.5 3.5 - 5.1 mmol/L   Chloride 101 98 - 111 mmol/L   CO2 23 22 - 32 mmol/L   Glucose, Bld 206 (H) 70 - 99 mg/dL   BUN 17 8 - 23 mg/dL   Creatinine, Ser 7.82 0.61 - 1.24 mg/dL   Calcium 9.3 8.9 - 95.6 mg/dL   Total Protein 7.1 6.5 - 8.1 g/dL   Albumin 3.3 (L) 3.5 - 5.0 g/dL   AST 35 15 - 41 U/L   ALT 20 0 - 44 U/L  Alkaline Phosphatase 78 38 - 126 U/L   Total Bilirubin 1.0 0.3 - 1.2 mg/dL   GFR, Estimated >95 >28 mL/min   Anion gap 13 5 - 15  Troponin I (High Sensitivity)     Status: Abnormal   Collection Time: 02/14/23  8:39 PM  Result Value Ref Range   Troponin I (High Sensitivity) 265 (HH) <18 ng/L  Protime-INR     Status: Abnormal   Collection Time: 02/14/23  8:39 PM  Result Value Ref Range   Prothrombin Time 25.2 (H) 11.4 - 15.2 seconds   INR 2.3 (H) 0.8 - 1.2  Brain natriuretic peptide     Status: Abnormal   Collection Time: 02/14/23  8:41 PM  Result Value Ref Range   B Natriuretic Peptide 684.2 (H) 0.0 - 100.0 pg/mL  I-Stat venous blood gas, ([facility], [facility], [facility])     Status: Abnormal   Collection Time: 02/14/23  8:52 PM  Result Value Ref Range   pH, Ven 7.399 7.25 - 7.43   pCO2, Ven 37.6 (L) 44 - 60 mmHg   pO2, Ven 73 (H) 32 - 45 mmHg   Bicarbonate 23.3 20.0 - 28.0 mmol/L   TCO2 24 22 - 32 mmol/L   O2 Saturation 95 %   Acid-base deficit 1.0 0.0 - 2.0 mmol/L   Sodium 138 135 - 145 mmol/L   Potassium 3.5 3.5 - 5.1 mmol/L   Calcium, Ion 1.17 1.15 - 1.40 mmol/L   HCT 41.0 39.0 - 52.0 %   Hemoglobin 13.9 13.0 - 17.0 g/dL   Sample type VENOUS   I-stat chem 8, ED (not at [facility], [facility] or [facility])     Status: Abnormal   Collection Time: 02/14/23  8:55 PM  Result Value Ref Range   Sodium 137 135 - 145 mmol/L   Potassium 3.4 (L) 3.5 - 5.1 mmol/L   Chloride 103 98 - 111 mmol/L   BUN 20 8 - 23 mg/dL   Creatinine, Ser 4.13 0.61 - 1.24 mg/dL   Glucose, Bld 244 (H) 70 -  99 mg/dL   Calcium, Ion 0.10 2.72 - 1.40 mmol/L   TCO2 23 22 - 32 mmol/L   Hemoglobin 13.6 13.0 - 17.0 g/dL   HCT 53.6 64.4 - 03.4 %  Resp panel by RT-PCR (RSV, Flu A&B, Covid) Anterior Nasal Swab     Status: None   Collection Time: 02/14/23  9:07 PM   Specimen: Anterior Nasal Swab  Result Value Ref Range   SARS Coronavirus 2 by RT PCR NEGATIVE NEGATIVE   Influenza A by PCR NEGATIVE NEGATIVE   Influenza B by PCR NEGATIVE NEGATIVE   Resp Syncytial Virus by PCR NEGATIVE NEGATIVE  I-Stat CG4 Lactic Acid     Status: Abnormal   Collection Time: 02/14/23  9:58 PM  Result Value Ref Range   Lactic Acid, Venous 5.0 (HH) 0.5 - 1.9 mmol/L   Comment NOTIFIED PHYSICIAN   Troponin I (High Sensitivity)     Status: Abnormal   Collection Time: 02/14/23 10:39 PM  Result Value Ref Range   Troponin I (High Sensitivity) 219 (HH) <18 ng/L  Urinalysis, w/ Reflex to Culture (Infection Suspected) -Urine, Clean Catch     Status: Abnormal   Collection Time: 02/14/23 10:44 PM  Result Value Ref Range   Specimen Source URINE, CLEAN CATCH    Color, Urine YELLOW YELLOW   APPearance CLOUDY (A) CLEAR   Specific Gravity, Urine 1.012 1.005 - 1.030   pH 5.0 5.0 - 8.0  Glucose, UA NEGATIVE NEGATIVE mg/dL   Hgb urine dipstick LARGE (A) NEGATIVE   Bilirubin Urine NEGATIVE NEGATIVE   Ketones, ur NEGATIVE NEGATIVE mg/dL   Protein, ur 30 (A) NEGATIVE mg/dL   Nitrite POSITIVE (A) NEGATIVE   Leukocytes,Ua LARGE (A) NEGATIVE   RBC / HPF >50 0 - 5 RBC/hpf   WBC, UA >50 0 - 5 WBC/hpf   Bacteria, UA MANY (A) NONE SEEN   Squamous Epithelial / HPF 0-5 0 - 5 /HPF   WBC Clumps PRESENT    Mucus PRESENT    Hyaline Casts, UA PRESENT      Assessment and Plan: New onset afib w rvr -on Cardizem drip but heart rate still uncontrolled at the moment.  Will titrate it up. Check echocardiogram. Cardiology consulted.  Continue Coumadin.  He remains quite tachypneic.  2.  CHF +/- aspiration pneumonia - He did receive a dose of  Lasix IV in the emergency department. Will continue IV Lasix as blood pressure tolerates. Resume spironolactone when BP allows.  He does not have a fever or an elevated white count.  His chest x-ray says less likely to be pneumonia, but he was covered with antibiotics which we will continue for now.  3.  Antiphospholipid syndrome / history of saddle PE - continue Coumadin  4. H/o CVA x 2 - the patient lives in a skilled nursing facility  5. CAD, h/o NSTEMI - he is on aspirin, Plavix, Beta blocker (and Coumadin).  The daughter says he has a history of sudden death but I do not see that referenced in his records. Follow serial cardiac enzymes.   6. UTI -  Rocephin   Medication list has not been completed.  Awaiting fax from the nursing home.  Twice daily prednisone is listed on his list and that needs to be verified.  The reason for daily prednisone is unclear.   Advance Care Planning:   Code Status: Prior the patient's daughter and son are both his healthcare power of attorneys.  The daughter is the first contact person.  The patient is DNR and has a MOST form in the chart.  Consults: [facility]  Family Communication: Patient's daughter and son were at bedside  Severity of Illness: The appropriate patient status for this patient is INPATIENT. Inpatient status is judged to be reasonable and necessary in order to provide the required intensity of service to ensure the patient's safety. The patient's presenting symptoms, physical exam findings, and initial radiographic and laboratory data in the context of their chronic comorbidities is felt to place them at high risk for further clinical deterioration. Furthermore, it is not anticipated that the patient will be medically stable for discharge from the hospital within 2 midnights of admission.   * I certify that at the point of admission it is my clinical judgment that the patient will require inpatient hospital care spanning beyond 2 midnights  from the point of admission due to high intensity of service, high risk for further deterioration and high frequency of surveillance required.*  Author: Buena Irish, MD 02/14/2023 11:33 PM  For on call review [email or web address].

## 2023-02-14 NOTE — Progress Notes (Incomplete)
PHARMACY - ANTICOAGULATION CONSULT NOTE  Pharmacy Consult for warfarin Indication: {Indications:3041533}  No Known Allergies  Patient Measurements:   Heparin Dosing Weight: ***  Vital Signs: Temp: 97.3 F (36.3 C) (11/03 2338) Temp Source: Oral (11/03 2018) BP: 111/84 (11/03 2330) Pulse Rate: 31 (11/03 2330)  Labs: Recent Labs    02/14/23 2039 02/14/23 2052 02/14/23 2055 02/14/23 2239  HGB 13.0 13.9 13.6  --   HCT 40.2 41.0 40.0  --   PLT 221  --   --   --   LABPROT 25.2*  --   --   --   INR 2.3*  --   --   --   CREATININE 1.15  --  0.90  --   TROPONINIHS 265*  --   --  219*    CrCl cannot be calculated (Unknown ideal weight.).   Medical History: Past Medical History:  Diagnosis Date   Antiphospholipid syndrome (HCC)    Cognitive deficits    CVA (cerebral vascular accident) (HCC) 2020   2020 and 07/2019   Diabetes mellitus (HCC)    Hypertension    Hypothyroidism    Moderate aortic stenosis    Pulmonary embolism (HCC)    Tobacco abuse     Assessment: Roger Ramirez is a 77 y.o. year old male presented on 02/14/2023 with concern for ***. On warfarin prior to admission for ***. Prior to admission regimen, *** (total weekly dose ***). Last dose prior to admission ***. Pharmacy consulted for warfarin inpatient management.   Date INR Warfarin Dose       Goal of Therapy:  {Goals:3041534} {Monitor platelets by anticoagulation protocol:3041561::"Monitor platelets by anticoagulation protocol: Yes"}   Plan:

## 2023-02-14 NOTE — ED Provider Notes (Signed)
[place] EMERGENCY DEPARTMENT AT [facility] Provider Note   CSN: [id number] Arrival date & time: 02/14/23  2015     History  No chief complaint on file.   Roger Ramirez is a 77 y.o. male with previous history of PE on Coumadin, hypertension, previous stroke on Plavix here presenting with shortness of breath.  Patient apparently vomited 2 days ago.  He then was short of breath since then.  Facility started patient on steroids and Lasix.  Facility was able to send off labs and showed a troponin of 300.  Patient had unremarkable chemistry.  Patient also was noted to have borderline oxygen saturation of 90 to 92% and was put on 2 L nasal cannula by EMS.  Patient was given DuoNeb by EMS  The history is provided by the patient and the EMS personnel.       Home Medications Prior to Admission medications   Medication Sig Start Date End Date Taking? Authorizing Provider  atorvastatin (LIPITOR) 40 MG tablet Take 1 tablet (40 mg total) by mouth daily. 08/17/19   Dorcas Carrow, MD  clopidogrel (PLAVIX) 75 MG tablet Take 1 tablet (75 mg total) by mouth daily. 09/07/19   Angiulli, Mcarthur Rossetti, PA-C  docusate sodium (COLACE) 100 MG capsule Take 1 capsule (100 mg total) by mouth 2 (two) times daily. 09/06/19   Angiulli, Mcarthur Rossetti, PA-C  insulin lispro (HUMALOG) 100 UNIT/ML KwikPen Inject into the skin. Sliding Scale 05/29/20   [provider]  levothyroxine (SYNTHROID) 75 MCG tablet Take 75 mcg by mouth daily. 05/21/19   [provider]  melatonin 3 MG TABS tablet Take 1 tablet (3 mg total) by mouth at bedtime. 09/06/19   Angiulli, Mcarthur Rossetti, PA-C  metFORMIN (GLUCOPHAGE-XR) 500 MG 24 hr tablet Take 500 mg by mouth daily. 11/04/20   [provider]  metoprolol succinate (TOPROL-XL) 25 MG 24 hr tablet Take 1 tablet (25 mg total) by mouth daily. 09/07/19   Angiulli, Mcarthur Rossetti, PA-C  Multiple Vitamin (MULTIVITAMIN WITH MINERALS) TABS tablet Take 1 tablet by mouth daily.  09/07/19   Angiulli, Mcarthur Rossetti, PA-C  nitroGLYCERIN (NITROSTAT) 0.4 MG SL tablet Place 1 tablet (0.4 mg total) under the tongue every 5 (five) minutes as needed for chest pain. 09/06/19   Angiulli, Mcarthur Rossetti, PA-C  polyethylene glycol (MIRALAX / GLYCOLAX) 17 g packet Take 17 g by mouth daily.    [provider]  vitamin B-12 1000 MCG tablet Take 1 tablet (1,000 mcg total) by mouth daily. Patient not taking: Reported on 05/29/2021 06/08/18   Albertine Grates, MD  warfarin (COUMADIN) 4 MG tablet Take 1 tablet (4 mg total) by mouth daily. 09/11/19 05/29/21  Angiulli, Mcarthur Rossetti, PA-C      Allergies    Patient has no known allergies.    Review of Systems   Review of Systems  Respiratory:  Positive for shortness of breath.   All other systems reviewed and are negative.   Physical Exam Updated Vital Signs BP 102/76   Pulse (!) 29   Temp 98.2 F (36.8 C) (Oral)   Resp (!) 27   SpO2 94%  Physical Exam Vitals and nursing note reviewed.  Constitutional:      Comments: Chronically ill and can only answer yes or no questions  HENT:     Head: Normocephalic.     Nose: Nose normal.     Mouth/Throat:     Mouth: Mucous membranes are dry.  Eyes:  Extraocular Movements: Extraocular movements intact.     Pupils: Pupils are equal, round, and reactive to light.  Cardiovascular:     Rate and Rhythm: Regular rhythm. Tachycardia present.     Pulses: Normal pulses.  Pulmonary:     Comments: Crackles bilateral bases Abdominal:     General: Abdomen is flat.     Palpations: Abdomen is soft.  Musculoskeletal:     Cervical back: Normal range of motion and neck supple.     Comments: 1+ edema bilaterally   Neurological:     General: No focal deficit present.     Mental Status: He is oriented to person, place, and time.  Psychiatric:        Mood and Affect: Mood normal.        Behavior: Behavior normal.     ED Results / Procedures / Treatments   Labs (all labs ordered are listed, but only abnormal  results are displayed) Labs Reviewed  CBC WITH DIFFERENTIAL/PLATELET - Abnormal; Notable for the following components:      Result Value   Lymphs Abs 0.4 (*)    All other components within normal limits  COMPREHENSIVE METABOLIC PANEL - Abnormal; Notable for the following components:   Glucose, Bld 206 (*)    Albumin 3.3 (*)    All other components within normal limits  BRAIN NATRIURETIC PEPTIDE - Abnormal; Notable for the following components:   B Natriuretic Peptide 684.2 (*)    All other components within normal limits  PROTIME-INR - Abnormal; Notable for the following components:   Prothrombin Time 25.2 (*)    INR 2.3 (*)    All other components within normal limits  I-STAT VENOUS BLOOD GAS, ED - Abnormal; Notable for the following components:   pCO2, Ven 37.6 (*)    pO2, Ven 73 (*)    All other components within normal limits  I-STAT CHEM 8, ED - Abnormal; Notable for the following components:   Potassium 3.4 (*)    Glucose, Bld 211 (*)    All other components within normal limits  I-STAT CG4 LACTIC ACID, ED - Abnormal; Notable for the following components:   Lactic Acid, Venous 5.0 (*)    All other components within normal limits  TROPONIN I (HIGH SENSITIVITY) - Abnormal; Notable for the following components:   Troponin I (High Sensitivity) 265 (*)    All other components within normal limits  RESP PANEL BY RT-PCR (RSV, FLU A&B, COVID)  [id number]  CULTURE, BLOOD (ROUTINE X 2)  CULTURE, BLOOD (ROUTINE X 2)  URINALYSIS, W/ REFLEX TO CULTURE (INFECTION SUSPECTED)  TROPONIN I (HIGH SENSITIVITY)    EKG None  Radiology DG Chest Port 1 View  Result Date: 02/14/2023 CLINICAL DATA:  Shortness of breath EXAM: PORTABLE CHEST 1 VIEW COMPARISON:  06/06/2018 FINDINGS: Mild bibasilar opacities, likely atelectasis, less likely pneumonia. Suspected small left pleural effusion. No pneumothorax. Heart is normal in size.  Thoracic aortic atherosclerosis. IMPRESSION: Mild bibasilar  opacities, likely atelectasis, less likely pneumonia. Suspected small left pleural effusion. Electronically Signed   By: Charline Bills M.D.   On: 02/14/2023 21:05    Procedures Procedures    CRITICAL CARE Performed by: Richardean Canal   Total critical care time: 39 minutes  Critical care time was exclusive of separately billable procedures and treating other patients.  Critical care was necessary to treat or prevent imminent or life-threatening deterioration.  Critical care was time spent personally by me on the following activities: development of treatment plan  with patient and/or surrogate as well as nursing, discussions with consultants, evaluation of patient's response to treatment, examination of patient, obtaining history from patient or surrogate, ordering and performing treatments and interventions, ordering and review of laboratory studies, ordering and review of radiographic studies, pulse oximetry and re-evaluation of patient's condition.   Medications Ordered in ED Medications  diltiazem (CARDIZEM) 1 mg/mL load via infusion 10 mg (10 mg Intravenous Bolus from Bag 02/14/23 2202)    And  diltiazem (CARDIZEM) 125 mg in dextrose 5% 125 mL (1 mg/mL) infusion (5 mg/hr Intravenous New Bag/Given 02/14/23 2201)  cefTRIAXone (ROCEPHIN) 1 g in sodium chloride 0.9 % 100 mL IVPB (1 g Intravenous New Bag/Given 02/14/23 2209)  azithromycin (ZITHROMAX) 500 mg in dextrose 5 % 250 mL IVPB (has no administration in time range)  furosemide (LASIX) injection 40 mg (has no administration in time range)    ED Course/ Medical Decision Making/ A&P                                 Medical Decision Making Roger Ramirez is a 77 y.o. male here presenting with shortness of breath.  Patient now requires 2 L nasal cannula which is new for him.  Patient has elevated troponin and appears volume overloaded.  Concern for possible CHF exacerbation.  Also consider COVID as well.  Will check INR and if  therapeutic I do not think he has PE.  Plan to get CBC and CMP and BNP and chest x-ray and VBG.  10:30 PM Patient's white blood cell count is normal.  VBG reassuring.  However patient's BNP is elevated at 680.  Patient's troponin is also elevated at 265.  Moreover patient is in rapid A-fib.  I gave patient Lasix and started patient on a Cardizem drip for rapid A-fib.  Patient's INR is 2.1.  Discussed with cardiology regarding the patient (Dr. Lajean Manes). Patient was also given IV antibiotics for pneumonia.  Hospitalist to admit for rapid A-fib, pneumonia, demand ischemia, CHF exacerbation.  Problems Addressed: Acute on chronic congestive heart failure, unspecified heart failure type [facility]): acute illness or injury Elevated troponin: acute illness or injury HCAP (healthcare-associated pneumonia): acute illness or injury  Amount and/or Complexity of Data Reviewed Labs: ordered. Decision-making details documented in ED Course. Radiology: ordered and independent interpretation performed. Decision-making details documented in ED Course. ECG/medicine tests: ordered and independent interpretation performed. Decision-making details documented in ED Course.  Risk Prescription drug management.    Final Clinical Impression(s) / ED Diagnoses Final diagnoses:  None    Rx / DC Orders ED Discharge Orders     None         Charlynne Pander, MD 02/14/23 2235

## 2023-02-14 NOTE — ED Triage Notes (Signed)
Pt bibgcems from [facility]. Patient had elevated troponin of 313 at facility. 94% ra and wheezing. 1 albuterol with ems. A&O to self at baseline. 116/70 Hr- 80 97% 2L  Cbg -204

## 2023-02-14 NOTE — H&P (Incomplete)
History and Physical    Patient: Roger Ramirez ZOX:[id number] DOB: 10-20-1945 DOA: 02/14/2023 DOS: the patient was seen and examined on 02/14/2023 PCP: Renford Dills, MD  Patient coming from: SNF  Chief Complaint: No chief complaint on file.  HPI: Roger Ramirez is a 77 y.o. male with medical history significant for        Review of Systems: unable to review all systems due to the inability of the patient to answer questions. Past Medical History:  Diagnosis Date  . Antiphospholipid syndrome (HCC)   . Cognitive deficits   . CVA (cerebral vascular accident) (HCC) 2020   2020 and 07/2019  . Diabetes mellitus (HCC)   . Hypertension   . Hypothyroidism   . Moderate aortic stenosis   . Pulmonary embolism (HCC)   . Tobacco abuse    History reviewed. No pertinent surgical history. Social History:  reports that he has quit smoking. His smoking use included cigarettes. He has a 20 pack-year smoking history. He has never used smokeless tobacco. He reports that he does not currently use alcohol. He reports that he does not use drugs.  No Known Allergies  Family History  Problem Relation Age of Onset  . Hypertension Mother   . Hypertension Father   . Dementia Maternal Grandmother   . Stroke Maternal Grandmother     Prior to Admission medications   Medication Sig Start Date End Date Taking? Authorizing Provider  acetaminophen (TYLENOL) 325 MG tablet Take 650 mg by mouth every 6 (six) hours as needed for mild pain (pain score 1-3) or moderate pain (pain score 4-6) (For general discomfort).   Yes [provider]  furosemide (LASIX) 40 MG tablet Take 40 mg by mouth daily. 02/14/23  Yes [provider]  metFORMIN (GLUCOPHAGE-XR) 750 MG 24 hr tablet Take 750 mg by mouth daily. 01/18/23  Yes [provider]  predniSONE (DELTASONE) 20 MG tablet Take 20 mg by mouth 2 (two) times daily. 02/14/23  Yes [provider]  spironolactone  (ALDACTONE) 25 MG tablet Take 25 mg by mouth daily. 02/14/23  Yes [provider]  warfarin (COUMADIN) 5 MG tablet Take 5 mg by mouth daily. 01/13/23  Yes [provider]  atorvastatin (LIPITOR) 40 MG tablet Take 1 tablet (40 mg total) by mouth daily. Patient taking differently: Take 40 mg by mouth at bedtime. 08/17/19   Dorcas Carrow, MD  clopidogrel (PLAVIX) 75 MG tablet Take 1 tablet (75 mg total) by mouth daily. 09/07/19   Angiulli, Mcarthur Rossetti, PA-C  insulin lispro (HUMALOG) 100 UNIT/ML KwikPen Inject 4 Units into the skin 3 (three) times daily before meals. Sliding Scale 05/29/20   [provider]  levothyroxine (SYNTHROID) 75 MCG tablet Take 75 mcg by mouth daily. 05/21/19   [provider]  melatonin 3 MG TABS tablet Take 1 tablet (3 mg total) by mouth at bedtime. 09/06/19   Angiulli, Mcarthur Rossetti, PA-C  metoprolol succinate (TOPROL-XL) 25 MG 24 hr tablet Take 1 tablet (25 mg total) by mouth daily. Patient taking differently: Take 25 mg by mouth in the morning and at bedtime. 09/07/19   Angiulli, Mcarthur Rossetti, PA-C  Multiple Vitamin (MULTIVITAMIN WITH MINERALS) TABS tablet Take 1 tablet by mouth daily. 09/07/19   Angiulli, Mcarthur Rossetti, PA-C  nitroGLYCERIN (NITROSTAT) 0.4 MG SL tablet Place 1 tablet (0.4 mg total) under the tongue every 5 (five) minutes as needed for chest pain. 09/06/19   Angiulli, Mcarthur Rossetti, PA-C  polyethylene glycol (MIRALAX / Ethelene Hal)  17 g packet Take 17 g by mouth daily.    [provider]  vitamin B-12 1000 MCG tablet Take 1 tablet (1,000 mcg total) by mouth daily. Patient taking differently: Take 500 mcg by mouth daily. 06/08/18   Albertine Grates, MD    Physical Exam: Vitals:   02/14/23 2250 02/14/23 2300 02/14/23 2315 02/14/23 2330  BP: (!) 105/96 108/81 107/81 111/84  Pulse: (!) 124 (!) 29 (!) 29 (!) 31  Resp: (!) 24     Temp:      TempSrc:      SpO2: 100% 95% 96% 96%   Physical Exam:  General: pale, tachypneic, well nourished HEENT:  Normocephalic, atraumatic, PER Cardiovascular: Tachycardic, irregular. Distal pulses faint Pulmonary: Tachypneic, no wheezing, faint crackles in the bases posteriorly Gastrointestinal: Nondistended abdomen, soft, non-tender, normoactive bowel sounds Musculoskeletal: trace lower ext edema Skin: Skin is warm and dry, slightly gray Neuro: Follows commands, no facial asymmetry, strength testing deferred because of his tachypnea, HOH PSYCH: cooperative  Data Reviewed: {Tip this will not be part of the note when signed- Document your independent interpretation of telemetry tracing, EKG, lab, Radiology test or any other diagnostic tests. Add any new diagnostic test ordered today. (Optional):[id number]} Results for orders placed or performed during the hospital encounter of 02/14/23 (from the past 24 hour(s))  CBC with Differential     Status: Abnormal   Collection Time: 02/14/23  8:39 PM  Result Value Ref Range   WBC 7.8 4.0 - 10.5 K/uL   RBC 4.50 4.22 - 5.81 MIL/uL   Hemoglobin 13.0 13.0 - 17.0 g/dL   HCT 81.1 91.4 - 78.2 %   MCV 89.3 80.0 - 100.0 fL   MCH 28.9 26.0 - 34.0 pg   MCHC 32.3 30.0 - 36.0 g/dL   RDW 95.6 21.3 - 08.6 %   Platelets 221 150 - 400 K/uL   nRBC 0.0 0.0 - 0.2 %   Neutrophils Relative % 93 %   Neutro Abs 7.2 1.7 - 7.7 K/uL   Lymphocytes Relative 5 %   Lymphs Abs 0.4 (L) 0.7 - 4.0 K/uL   Monocytes Relative 1 %   Monocytes Absolute 0.1 0.1 - 1.0 K/uL   Eosinophils Relative 0 %   Eosinophils Absolute 0.0 0.0 - 0.5 K/uL   Basophils Relative 0 %   Basophils Absolute 0.0 0.0 - 0.1 K/uL   Immature Granulocytes 1 %   Abs Immature Granulocytes 0.04 0.00 - 0.07 K/uL  Comprehensive metabolic panel     Status: Abnormal   Collection Time: 02/14/23  8:39 PM  Result Value Ref Range   Sodium 137 135 - 145 mmol/L   Potassium 3.5 3.5 - 5.1 mmol/L   Chloride 101 98 - 111 mmol/L   CO2 23 22 - 32 mmol/L   Glucose, Bld 206 (H) 70 - 99 mg/dL   BUN 17 8 - 23 mg/dL   Creatinine, Ser  5.78 0.61 - 1.24 mg/dL   Calcium 9.3 8.9 - 46.9 mg/dL   Total Protein 7.1 6.5 - 8.1 g/dL   Albumin 3.3 (L) 3.5 - 5.0 g/dL   AST 35 15 - 41 U/L   ALT 20 0 - 44 U/L   Alkaline Phosphatase 78 38 - 126 U/L   Total Bilirubin 1.0 0.3 - 1.2 mg/dL   GFR, Estimated >62 >95 mL/min   Anion gap 13 5 - 15  Troponin I (High Sensitivity)     Status: Abnormal   Collection Time: 02/14/23  8:39 PM  Result Value Ref Range   Troponin I (High Sensitivity) 265 (HH) <18 ng/L  Protime-INR     Status: Abnormal   Collection Time: 02/14/23  8:39 PM  Result Value Ref Range   Prothrombin Time 25.2 (H) 11.4 - 15.2 seconds   INR 2.3 (H) 0.8 - 1.2  Brain natriuretic peptide     Status: Abnormal   Collection Time: 02/14/23  8:41 PM  Result Value Ref Range   B Natriuretic Peptide 684.2 (H) 0.0 - 100.0 pg/mL  I-Stat venous blood gas, ([facility] ED, [facility], [facility])     Status: Abnormal   Collection Time: 02/14/23  8:52 PM  Result Value Ref Range   pH, Ven 7.399 7.25 - 7.43   pCO2, Ven 37.6 (L) 44 - 60 mmHg   pO2, Ven 73 (H) 32 - 45 mmHg   Bicarbonate 23.3 20.0 - 28.0 mmol/L   TCO2 24 22 - 32 mmol/L   O2 Saturation 95 %   Acid-base deficit 1.0 0.0 - 2.0 mmol/L   Sodium 138 135 - 145 mmol/L   Potassium 3.5 3.5 - 5.1 mmol/L   Calcium, Ion 1.17 1.15 - 1.40 mmol/L   HCT 41.0 39.0 - 52.0 %   Hemoglobin 13.9 13.0 - 17.0 g/dL   Sample type VENOUS   I-stat chem 8, ED (not at [facility], [facility] or [facility])     Status: Abnormal   Collection Time: 02/14/23  8:55 PM  Result Value Ref Range   Sodium 137 135 - 145 mmol/L   Potassium 3.4 (L) 3.5 - 5.1 mmol/L   Chloride 103 98 - 111 mmol/L   BUN 20 8 - 23 mg/dL   Creatinine, Ser 1.61 0.61 - 1.24 mg/dL   Glucose, Bld 096 (H) 70 - 99 mg/dL   Calcium, Ion 0.45 4.09 - 1.40 mmol/L   TCO2 23 22 - 32 mmol/L   Hemoglobin 13.6 13.0 - 17.0 g/dL   HCT 81.1 91.4 - 78.2 %  Resp panel by RT-PCR (RSV, Flu A&B, Covid) Anterior Nasal Swab     Status: None   Collection Time: 02/14/23  9:07 PM   Specimen:  Anterior Nasal Swab  Result Value Ref Range   SARS Coronavirus 2 by RT PCR NEGATIVE NEGATIVE   Influenza A by PCR NEGATIVE NEGATIVE   Influenza B by PCR NEGATIVE NEGATIVE   Resp Syncytial Virus by PCR NEGATIVE NEGATIVE  I-Stat CG4 Lactic Acid     Status: Abnormal   Collection Time: 02/14/23  9:58 PM  Result Value Ref Range   Lactic Acid, Venous 5.0 (HH) 0.5 - 1.9 mmol/L   Comment NOTIFIED PHYSICIAN   Troponin I (High Sensitivity)     Status: Abnormal   Collection Time: 02/14/23 10:39 PM  Result Value Ref Range   Troponin I (High Sensitivity) 219 (HH) <18 ng/L  Urinalysis, w/ Reflex to Culture (Infection Suspected) -Urine, Clean Catch     Status: Abnormal   Collection Time: 02/14/23 10:44 PM  Result Value Ref Range   Specimen Source URINE, CLEAN CATCH    Color, Urine YELLOW YELLOW   APPearance CLOUDY (A) CLEAR   Specific Gravity, Urine 1.012 1.005 - 1.030   pH 5.0 5.0 - 8.0   Glucose, UA NEGATIVE NEGATIVE mg/dL   Hgb urine dipstick LARGE (A) NEGATIVE   Bilirubin Urine NEGATIVE NEGATIVE   Ketones, ur NEGATIVE NEGATIVE mg/dL   Protein, ur 30 (A) NEGATIVE mg/dL   Nitrite POSITIVE (A) NEGATIVE   Leukocytes,Ua LARGE (A) NEGATIVE   RBC /  HPF >50 0 - 5 RBC/hpf   WBC, UA >50 0 - 5 WBC/hpf   Bacteria, UA MANY (A) NONE SEEN   Squamous Epithelial / HPF 0-5 0 - 5 /HPF   WBC Clumps PRESENT    Mucus PRESENT    Hyaline Casts, UA PRESENT      Assessment and Plan: New onset afib w rvr -on Cardizem drip but heart rate still uncontrolled at the moment.  Check echocardiogram. Cardiology consulted.  Continue Coumadin.  2.  CHF +/- aspiration pneumonia - Maxipime and Zithromax.  He did receive a dose of Lasix IV in the emergency department will continue IV Lasix as blood pressure tolerates. Resume spironolactone when BP allows.  3.  Antiphospholipid syndrome / history of saddle PE - continue Coumadin  4. H/o CVA x 2 - the patient lives in a skilled nursing facility  5. CAD, h/o NSTEMI -he  is on aspirin, Plavix, and Coumadin.  The daughter says he has a history of sudden death but I do not see that referenced in his records. Follow serial cardiac enzymes.    Advance Care Planning:   Code Status: Prior   Consults: [facility]  Family Communication: Patient's daughter and son were at bedside  Severity of Illness: The appropriate patient status for this patient is INPATIENT. Inpatient status is judged to be reasonable and necessary in order to provide the required intensity of service to ensure the patient's safety. The patient's presenting symptoms, physical exam findings, and initial radiographic and laboratory data in the context of their chronic comorbidities is felt to place them at high risk for further clinical deterioration. Furthermore, it is not anticipated that the patient will be medically stable for discharge from the hospital within 2 midnights of admission.   * I certify that at the point of admission it is my clinical judgment that the patient will require inpatient hospital care spanning beyond 2 midnights from the point of admission due to high intensity of service, high risk for further deterioration and high frequency of surveillance required.*  Author: Buena Irish, MD 02/14/2023 11:33 PM  For on call review [email or web address].

## 2023-02-14 NOTE — ED Notes (Signed)
Per MD Gasper Sells increase Cardizem by 2.5mg .

## 2023-02-14 NOTE — ED Notes (Signed)
Attempted in and out cath x2 but no success MD Silverio Lay advised

## 2023-02-15 ENCOUNTER — Other Ambulatory Visit: Payer: Self-pay

## 2023-02-15 ENCOUNTER — Inpatient Hospital Stay (HOSPITAL_COMMUNITY): Payer: Medicare Other

## 2023-02-15 DIAGNOSIS — F0390 Unspecified dementia without behavioral disturbance: Secondary | ICD-10-CM

## 2023-02-15 DIAGNOSIS — I5033 Acute on chronic diastolic (congestive) heart failure: Secondary | ICD-10-CM | POA: Diagnosis not present

## 2023-02-15 DIAGNOSIS — I10 Essential (primary) hypertension: Secondary | ICD-10-CM

## 2023-02-15 DIAGNOSIS — I4891 Unspecified atrial fibrillation: Secondary | ICD-10-CM

## 2023-02-15 DIAGNOSIS — J690 Pneumonitis due to inhalation of food and vomit: Secondary | ICD-10-CM | POA: Diagnosis not present

## 2023-02-15 DIAGNOSIS — E119 Type 2 diabetes mellitus without complications: Secondary | ICD-10-CM | POA: Diagnosis not present

## 2023-02-15 DIAGNOSIS — I5043 Acute on chronic combined systolic (congestive) and diastolic (congestive) heart failure: Secondary | ICD-10-CM | POA: Insufficient documentation

## 2023-02-15 LAB — BLOOD CULTURE ID PANEL (REFLEXED) - BCID2

## 2023-02-15 LAB — ECHOCARDIOGRAM COMPLETE
AR max vel: 0.53 cm2
AV Area VTI: 0.5 cm2
AV Area mean vel: 0.51 cm2
AV Mean grad: 38 mm[Hg]
AV Peak grad: 55.4 mm[Hg]
Ao pk vel: 3.72 m/s
Area-P 1/2: 4.83 cm2
Est EF: 25
S' Lateral: 5.1 cm

## 2023-02-15 LAB — CBC
HCT: 38 % — ABNORMAL LOW (ref 39.0–52.0)
HCT: 37.7 % — ABNORMAL LOW (ref 39.0–52.0)
Hemoglobin: 12.5 g/dL — ABNORMAL LOW (ref 13.0–17.0)
Hemoglobin: 12.3 g/dL — ABNORMAL LOW (ref 13.0–17.0)
MCH: 29.4 pg (ref 26.0–34.0)
MCH: 28.8 pg (ref 26.0–34.0)
MCHC: 32.9 g/dL (ref 30.0–36.0)
MCHC: 32.6 g/dL (ref 30.0–36.0)
MCV: 89.4 fL (ref 80.0–100.0)
MCV: 88.3 fL (ref 80.0–100.0)
Platelets: 226 10*3/uL (ref 150–400)
Platelets: 224 10*3/uL (ref 150–400)
RBC: 4.25 MIL/uL (ref 4.22–5.81)
RBC: 4.27 MIL/uL (ref 4.22–5.81)
RDW: 15.5 % (ref 11.5–15.5)
RDW: 15.5 % (ref 11.5–15.5)
WBC: 16.2 10*3/uL — ABNORMAL HIGH (ref 4.0–10.5)
WBC: 15.3 10*3/uL — ABNORMAL HIGH (ref 4.0–10.5)
nRBC: 0 % (ref 0.0–0.2)
nRBC: 0 % (ref 0.0–0.2)

## 2023-02-15 LAB — BASIC METABOLIC PANEL
Anion gap: 13 (ref 5–15)
BUN: 19 mg/dL (ref 8–23)
CO2: 19 mmol/L — ABNORMAL LOW (ref 22–32)
Calcium: 9 mg/dL (ref 8.9–10.3)
Chloride: 104 mmol/L (ref 98–111)
Creatinine, Ser: 1.15 mg/dL (ref 0.61–1.24)
GFR, Estimated: >60 mL/min (ref >60)
Glucose, Bld: 198 mg/dL — ABNORMAL HIGH (ref 70–99)
Potassium: 3.9 mmol/L (ref 3.5–5.1)
Sodium: 136 mmol/L (ref 135–145)

## 2023-02-15 LAB — PROTIME-INR
INR: 2.4 — ABNORMAL HIGH (ref 0.8–1.2)
INR: 2.3 — ABNORMAL HIGH (ref 0.8–1.2)
Prothrombin Time: 26.1 s — ABNORMAL HIGH (ref 11.4–15.2)
Prothrombin Time: 25.9 s — ABNORMAL HIGH (ref 11.4–15.2)

## 2023-02-15 LAB — TROPONIN I (HIGH SENSITIVITY)
Troponin I (High Sensitivity): 584 ng/L (ref <18)
Troponin I (High Sensitivity): 681 ng/L (ref <18)

## 2023-02-15 LAB — I-STAT CG4 LACTIC ACID, ED
Lactic Acid, Venous: 3.9 mmol/L (ref 0.5–1.9)
Lactic Acid, Venous: 4.3 mmol/L (ref 0.5–1.9)
Lactic Acid, Venous: 6 mmol/L (ref 0.5–1.9)

## 2023-02-15 LAB — GLUCOSE, CAPILLARY
Glucose-Capillary: 177 mg/dL — ABNORMAL HIGH (ref 70–99)
Glucose-Capillary: 172 mg/dL — ABNORMAL HIGH (ref 70–99)

## 2023-02-15 LAB — CBG MONITORING, ED: Glucose-Capillary: 177 mg/dL — ABNORMAL HIGH (ref 70–99)

## 2023-02-15 LAB — PROCALCITONIN: Procalcitonin: 1.11 ng/mL

## 2023-02-15 LAB — TSH: TSH: 1.034 u[IU]/mL (ref 0.350–4.500)

## 2023-02-15 MED ORDER — SODIUM CHLORIDE 0.9 % IV SOLN: 2.0000 g | Freq: Two times a day (BID) | INTRAVENOUS | Status: DC

## 2023-02-15 MED ORDER — WARFARIN - PHARMACIST DOSING INPATIENT: Freq: Every day | Status: DC

## 2023-02-15 MED ORDER — LEVOTHYROXINE SODIUM 75 MCG PO TABS
75.0000 ug | ORAL_TABLET | Freq: Every day | ORAL | Status: DC
Start: 2023-02-15 — End: 2023-02-17
  Administered 2023-02-15 – 2023-02-17 (×3): 75 ug via ORAL
  Filled 2023-02-15 (×3): qty 1

## 2023-02-15 MED ORDER — METOPROLOL TARTRATE 5 MG/5ML IV SOLN
2.5000 mg | Freq: Four times a day (QID) | INTRAVENOUS | Status: DC | PRN
  Administered 2023-02-16: 2.5 mg via INTRAVENOUS
  Filled 2023-02-15: qty 5

## 2023-02-15 MED ORDER — FUROSEMIDE 10 MG/ML IJ SOLN
20.0000 mg | Freq: Every day | INTRAMUSCULAR | Status: DC
  Administered 2023-02-15: 20 mg via INTRAVENOUS
  Filled 2023-02-15 (×3): qty 2

## 2023-02-15 MED ORDER — METOPROLOL TARTRATE 25 MG PO TABS: 25.0000 mg | ORAL_TABLET | Freq: Two times a day (BID) | ORAL | Status: DC

## 2023-02-15 MED ORDER — INSULIN ASPART 100 UNIT/ML IJ SOLN
0.0000 [IU] | Freq: Three times a day (TID) | INTRAMUSCULAR | Status: DC
  Administered 2023-02-15 (×2): 2 [IU] via SUBCUTANEOUS
  Administered 2023-02-16 (×2): 1 [IU] via SUBCUTANEOUS
  Administered 2023-02-17: 2 [IU] via SUBCUTANEOUS

## 2023-02-15 MED ORDER — METOPROLOL SUCCINATE ER 25 MG PO TB24
25.0000 mg | ORAL_TABLET | Freq: Two times a day (BID) | ORAL | Status: DC
  Administered 2023-02-15: 25 mg via ORAL
  Filled 2023-02-15 (×2): qty 1

## 2023-02-15 MED ORDER — INSULIN ASPART 100 UNIT/ML IJ SOLN: 0.0000 [IU] | Freq: Every day | INTRAMUSCULAR | Status: DC

## 2023-02-15 MED ORDER — CLOPIDOGREL BISULFATE 75 MG PO TABS
75.0000 mg | ORAL_TABLET | Freq: Every day | ORAL | Status: DC
  Administered 2023-02-15 – 2023-02-17 (×3): 75 mg via ORAL
  Filled 2023-02-15 (×3): qty 1

## 2023-02-15 MED ORDER — SPIRONOLACTONE 25 MG PO TABS
25.0000 mg | ORAL_TABLET | Freq: Every day | ORAL | Status: DC
  Administered 2023-02-16 – 2023-02-17 (×2): 25 mg via ORAL
  Filled 2023-02-15 (×2): qty 1

## 2023-02-15 MED ORDER — WARFARIN SODIUM 5 MG PO TABS
5.0000 mg | ORAL_TABLET | Freq: Once | ORAL | Status: AC
  Administered 2023-02-15: 5 mg via ORAL
  Filled 2023-02-15 (×2): qty 1

## 2023-02-15 MED ORDER — MELATONIN 3 MG PO TABS
3.0000 mg | ORAL_TABLET | Freq: Every day | ORAL | Status: DC
  Administered 2023-02-15 – 2023-02-16 (×3): 3 mg via ORAL
  Filled 2023-02-15 (×3): qty 1

## 2023-02-15 MED ORDER — DEXTROSE 5 % IV SOLN: 500.0000 mg | INTRAVENOUS | Status: DC

## 2023-02-15 MED ORDER — CEFTRIAXONE SODIUM 2 G IJ SOLR
2.0000 g | INTRAMUSCULAR | Status: DC
  Administered 2023-02-15 – 2023-02-16 (×2): 2 g via INTRAVENOUS
  Filled 2023-02-15 (×2): qty 20

## 2023-02-15 MED ORDER — ATORVASTATIN CALCIUM 40 MG PO TABS
40.0000 mg | ORAL_TABLET | Freq: Every day | ORAL | Status: DC
  Administered 2023-02-16 – 2023-02-17 (×2): 40 mg via ORAL
  Filled 2023-02-15 (×2): qty 1

## 2023-02-15 NOTE — Assessment & Plan Note (Signed)
EF 2021 was normal.  Now down to 25%.  Suspect due to AS. - Continue Cautious IV furosemide -Strict I/Os, daily weights, telemetry  -Daily monitoring renal function and K - Consult Cardiology for prognosis, AS

## 2023-02-15 NOTE — ED Notes (Signed)
ED TO INPATIENT HANDOFF REPORT  ED Nurse Name and Phone #: Gustavo Lah rn, [place]   S Name/Age/Gender Roger Ramirez 77 y.o. male Room/Bed: 003C/003C  Code Status   Code Status: Limited: Do not attempt resuscitation (DNR) -DNR-LIMITED -Do Not Intubate/DNI   Home/SNF/Other [facility] nursing home Patient oriented to: self Is this baseline? Yes   Triage Complete: Triage complete  Chief Complaint Atrial fibrillation with RVR (HCC) [I48.91]  Triage Note Pt bibgcems from [facility] nursing home. Patient had elevated troponin of 313 at facility. 94% ra and wheezing. 1 albuterol with ems. A&O to self at baseline. 116/70 Hr- 80 97% 2L  Cbg -204     Allergies No Known Allergies  Level of Care/Admitting Diagnosis ED Disposition     ED Disposition  Admit   Condition  --   Comment  Hospital Area: [facility] [[id number]]  Level of Care: Progressive [102]  Admit to Progressive based on following criteria: CARDIOVASCULAR & THORACIC of moderate stability with acute coronary syndrome symptoms/low risk myocardial infarction/hypertensive urgency/arrhythmias/heart failure potentially compromising stability and stable post cardiovascular intervention patients.  May admit patient to [facility] or [facility] if equivalent level of care is available:: Yes  Covid Evaluation: Asymptomatic - no recent exposure (last 10 days) testing not required  Diagnosis: Atrial fibrillation with RVR [facility]) [[id number]]  Admitting Physician: Buena Irish [[id number]]  Attending Physician: Buena Irish [phone]  Certification:: I certify this patient will need inpatient services for at least 2 midnights  Expected Medical Readiness: 02/18/2023          B Medical/Surgery History Past Medical History:  Diagnosis Date   Antiphospholipid syndrome (HCC)    Cognitive deficits    CVA (cerebral vascular accident) (HCC) 2020   2020 and 07/2019   Diabetes mellitus (HCC)    Hypertension     Hypothyroidism    Moderate aortic stenosis    Pulmonary embolism (HCC)    Tobacco abuse    History reviewed. No pertinent surgical history.   A IV Location/Drains/Wounds Patient Lines/Drains/Airways Status     Active Line/Drains/Airways     Name Placement date Placement time Site Days   Peripheral IV 02/15/23 18 G Anterior;Distal;Left;Upper Arm 02/15/23  --  Arm  less than 1            Intake/Output Last 24 hours  Intake/Output Summary (Last 24 hours) at 02/15/2023 1415 Last data filed at 02/15/2023 1010 Gross per 24 hour  Intake 622.89 ml  Output --  Net 622.89 ml    Labs/Imaging Results for orders placed or performed during the hospital encounter of 02/14/23 (from the past 48 hour(s))  CBC with Differential     Status: Abnormal   Collection Time: 02/14/23  8:39 PM  Result Value Ref Range   WBC 7.8 4.0 - 10.5 K/uL   RBC 4.50 4.22 - 5.81 MIL/uL   Hemoglobin 13.0 13.0 - 17.0 g/dL   HCT 21.3 08.6 - 57.8 %   MCV 89.3 80.0 - 100.0 fL   MCH 28.9 26.0 - 34.0 pg   MCHC 32.3 30.0 - 36.0 g/dL   RDW 46.9 62.9 - 52.8 %   Platelets 221 150 - 400 K/uL   nRBC 0.0 0.0 - 0.2 %   Neutrophils Relative % 93 %   Neutro Abs 7.2 1.7 - 7.7 K/uL   Lymphocytes Relative 5 %   Lymphs Abs 0.4 (L) 0.7 - 4.0 K/uL   Monocytes Relative 1 %   Monocytes Absolute 0.1 0.1 -  1.0 K/uL   Eosinophils Relative 0 %   Eosinophils Absolute 0.0 0.0 - 0.5 K/uL   Basophils Relative 0 %   Basophils Absolute 0.0 0.0 - 0.1 K/uL   Immature Granulocytes 1 %   Abs Immature Granulocytes 0.04 0.00 - 0.07 K/uL    Comment: Performed at [facility], [address] [place], [place] [address]  Comprehensive metabolic panel     Status: Abnormal   Collection Time: 02/14/23  8:39 PM  Result Value Ref Range   Sodium 137 135 - 145 mmol/L   Potassium 3.5 3.5 - 5.1 mmol/L   Chloride 101 98 - 111 mmol/L   CO2 23 22 - 32 mmol/L   Glucose, Bld 206 (H) 70 - 99 mg/dL    Comment: Glucose reference range applies  only to samples taken after fasting for at least 8 hours.   BUN 17 8 - 23 mg/dL   Creatinine, Ser 6.04 0.61 - 1.24 mg/dL   Calcium 9.3 8.9 - 54.0 mg/dL   Total Protein 7.1 6.5 - 8.1 g/dL   Albumin 3.3 (L) 3.5 - 5.0 g/dL   AST 35 15 - 41 U/L   ALT 20 0 - 44 U/L   Alkaline Phosphatase 78 38 - 126 U/L   Total Bilirubin 1.0 0.3 - 1.2 mg/dL   GFR, Estimated >98 >11 mL/min    Comment: (NOTE) Calculated using the CKD-EPI Creatinine Equation (2021)    Anion gap 13 5 - 15    Comment: Performed at [facility], [address] [place], [place] [address]  Troponin I (High Sensitivity)     Status: Abnormal   Collection Time: 02/14/23  8:39 PM  Result Value Ref Range   Troponin I (High Sensitivity) 265 (HH) <18 ng/L    Comment: CRITICAL RESULT CALLED TO, READ BACK BY AND VERIFIED WITH EZELL,W RN 2141 02/14/23 AMIREHSANI F (NOTE) Elevated high sensitivity troponin I (hsTnI) values and significant  changes across serial measurements may suggest ACS but many other  chronic and acute conditions are known to elevate hsTnI results.  Refer to the "Links" section for chest pain algorithms and additional  guidance. Performed at [facility], [address] [place], [place] [address]   Protime-INR     Status: Abnormal   Collection Time: 02/14/23  8:39 PM  Result Value Ref Range   Prothrombin Time 25.2 (H) 11.4 - 15.2 seconds   INR 2.3 (H) 0.8 - 1.2    Comment: (NOTE) INR goal varies based on device and disease states. Performed at [facility], [address], [place], [place] [address]   Brain natriuretic peptide     Status: Abnormal   Collection Time: 02/14/23  8:41 PM  Result Value Ref Range   B Natriuretic Peptide 684.2 (H) 0.0 - 100.0 pg/mL    Comment: Performed at [facility], [address], [place], [place] [address]  I-Stat venous blood gas, [facility], [facility], [facility])     Status: Abnormal   Collection Time: 02/14/23  8:52 PM  Result Value Ref Range   pH, Ven 7.399  7.25 - 7.43   pCO2, Ven 37.6 (L) 44 - 60 mmHg   pO2, Ven 73 (H) 32 - 45 mmHg   Bicarbonate 23.3 20.0 - 28.0 mmol/L   TCO2 24 22 - 32 mmol/L   O2 Saturation 95 %   Acid-base deficit 1.0 0.0 - 2.0 mmol/L   Sodium 138 135 - 145 mmol/L  Potassium 3.5 3.5 - 5.1 mmol/L   Calcium, Ion 1.17 1.15 - 1.40 mmol/L   HCT 41.0 39.0 - 52.0 %   Hemoglobin 13.9 13.0 - 17.0 g/dL   Sample type VENOUS   I-stat chem 8, ED (not at [facility], [facility] or [facility])     Status: Abnormal   Collection Time: 02/14/23  8:55 PM  Result Value Ref Range   Sodium 137 135 - 145 mmol/L   Potassium 3.4 (L) 3.5 - 5.1 mmol/L   Chloride 103 98 - 111 mmol/L   BUN 20 8 - 23 mg/dL   Creatinine, Ser 5.40 0.61 - 1.24 mg/dL   Glucose, Bld 981 (H) 70 - 99 mg/dL    Comment: Glucose reference range applies only to samples taken after fasting for at least 8 hours.   Calcium, Ion 1.17 1.15 - 1.40 mmol/L   TCO2 23 22 - 32 mmol/L   Hemoglobin 13.6 13.0 - 17.0 g/dL   HCT 19.1 47.8 - 29.5 %  Resp panel by RT-PCR (RSV, Flu A&B, Covid) Anterior Nasal Swab     Status: None   Collection Time: 02/14/23  9:07 PM   Specimen: Anterior Nasal Swab  Result Value Ref Range   SARS Coronavirus 2 by RT PCR NEGATIVE NEGATIVE   Influenza A by PCR NEGATIVE NEGATIVE   Influenza B by PCR NEGATIVE NEGATIVE    Comment: (NOTE) The Xpert Xpress SARS-CoV-2/FLU/RSV plus assay is intended as an aid in the diagnosis of influenza from Nasopharyngeal swab specimens and should not be used as a sole basis for treatment. Nasal washings and aspirates are unacceptable for Xpert Xpress SARS-CoV-2/FLU/RSV testing.  Fact Sheet for Patients: BloggerCourse.com  Fact Sheet for Healthcare Providers: SeriousBroker.it  This test is not yet approved or cleared by the [place] FDA and has been authorized for detection and/or diagnosis of SARS-CoV-2 by FDA under an Emergency Use Authorization (EUA). This EUA will remain in  effect (meaning this test can be used) for the duration of the COVID-19 declaration under Section 564(b)(1) of the Act, 21 U.S.C. section 360bbb-3(b)(1), unless the authorization is terminated or revoked.     Resp Syncytial Virus by PCR NEGATIVE NEGATIVE    Comment: (NOTE) Fact Sheet for Patients: BloggerCourse.com  Fact Sheet for Healthcare Providers: SeriousBroker.it  This test is not yet approved or cleared by the [place] FDA and has been authorized for detection and/or diagnosis of SARS-CoV-2 by FDA under an Emergency Use Authorization (EUA). This EUA will remain in effect (meaning this test can be used) for the duration of the COVID-19 declaration under Section 564(b)(1) of the Act, 21 U.S.C. section 360bbb-3(b)(1), unless the authorization is terminated or revoked.  Performed at [facility], [address], [place], [place] [address]   I-Stat CG4 Lactic Acid     Status: Abnormal   Collection Time: 02/14/23  9:58 PM  Result Value Ref Range   Lactic Acid, Venous 5.0 (HH) 0.5 - 1.9 mmol/L   Comment NOTIFIED PHYSICIAN   Troponin I (High Sensitivity)     Status: Abnormal   Collection Time: 02/14/23 10:39 PM  Result Value Ref Range   Troponin I (High Sensitivity) 219 (HH) <18 ng/L    Comment: CRITICAL VALUE NOTED. VALUE IS CONSISTENT WITH PREVIOUSLY REPORTED/CALLED VALUE (NOTE) Elevated high sensitivity troponin I (hsTnI) values and significant  changes across serial measurements may suggest ACS but many other  chronic and acute conditions are known to elevate hsTnI results.  Refer to the "Links" section  for chest pain algorithms and additional  guidance. Performed at [facility], [address], [place], [place] [address]   Urinalysis, w/ Reflex to Culture (Infection Suspected) -Urine, Clean Catch     Status: Abnormal   Collection Time: 02/14/23 10:44 PM  Result Value Ref Range   Specimen Source  URINE, CLEAN CATCH    Color, Urine YELLOW YELLOW   APPearance CLOUDY (A) CLEAR   Specific Gravity, Urine 1.012 1.005 - 1.030   pH 5.0 5.0 - 8.0   Glucose, UA NEGATIVE NEGATIVE mg/dL   Hgb urine dipstick LARGE (A) NEGATIVE   Bilirubin Urine NEGATIVE NEGATIVE   Ketones, ur NEGATIVE NEGATIVE mg/dL   Protein, ur 30 (A) NEGATIVE mg/dL   Nitrite POSITIVE (A) NEGATIVE   Leukocytes,Ua LARGE (A) NEGATIVE   RBC / HPF >50 0 - 5 RBC/hpf   WBC, UA >50 0 - 5 WBC/hpf    Comment:        Reflex urine culture not performed if WBC <=10, OR if Squamous epithelial cells >5. If Squamous epithelial cells >5 suggest recollection.    Bacteria, UA MANY (A) NONE SEEN   Squamous Epithelial / HPF 0-5 0 - 5 /HPF   WBC Clumps PRESENT    Mucus PRESENT    Hyaline Casts, UA PRESENT     Comment: Performed at [facility], [address] [place], [place] [address]  I-Stat CG4 Lactic Acid     Status: Abnormal   Collection Time: 02/15/23 12:04 AM  Result Value Ref Range   Lactic Acid, Venous 6.0 (HH) 0.5 - 1.9 mmol/L   Comment NOTIFIED PHYSICIAN   Basic metabolic panel     Status: Abnormal   Collection Time: 02/15/23  3:11 AM  Result Value Ref Range   Sodium 136 135 - 145 mmol/L   Potassium 3.9 3.5 - 5.1 mmol/L   Chloride 104 98 - 111 mmol/L   CO2 19 (L) 22 - 32 mmol/L   Glucose, Bld 198 (H) 70 - 99 mg/dL    Comment: Glucose reference range applies only to samples taken after fasting for at least 8 hours.   BUN 19 8 - 23 mg/dL   Creatinine, Ser 0.98 0.61 - 1.24 mg/dL   Calcium 9.0 8.9 - 11.9 mg/dL   GFR, Estimated >14 >78 mL/min    Comment: (NOTE) Calculated using the CKD-EPI Creatinine Equation (2021)    Anion gap 13 5 - 15    Comment: Performed at [facility], [address] [place], [place] [address]  CBC     Status: Abnormal   Collection Time: 02/15/23  3:11 AM  Result Value Ref Range   WBC 16.2 (H) 4.0 - 10.5 K/uL   RBC 4.25 4.22 - 5.81 MIL/uL   Hemoglobin 12.5 (L) 13.0 - 17.0  g/dL   HCT 13.0 (L) 86.5 - 78.4 %   MCV 89.4 80.0 - 100.0 fL   MCH 29.4 26.0 - 34.0 pg   MCHC 32.9 30.0 - 36.0 g/dL   RDW 69.6 29.5 - 28.4 %   Platelets 226 150 - 400 K/uL   nRBC 0.0 0.0 - 0.2 %    Comment: Performed at [facility], [address] [place], [place] [address]  TSH     Status: None   Collection Time: 02/15/23  3:11 AM  Result Value Ref Range   TSH 1.034 0.350 - 4.500 uIU/mL    Comment: Performed by a 3rd Generation assay with a  functional sensitivity of <=0.01 uIU/mL. Performed at [facility], [address], [place], [place] [address]   Protime-INR     Status: Abnormal   Collection Time: 02/15/23  3:11 AM  Result Value Ref Range   Prothrombin Time 26.1 (H) 11.4 - 15.2 seconds   INR 2.4 (H) 0.8 - 1.2    Comment: (NOTE) INR goal varies based on device and disease states. Performed at [facility], [address], [place], [place] [address]   Protime-INR     Status: Abnormal   Collection Time: 02/15/23  5:14 AM  Result Value Ref Range   Prothrombin Time 25.9 (H) 11.4 - 15.2 seconds   INR 2.3 (H) 0.8 - 1.2    Comment: (NOTE) INR goal varies based on device and disease states. Performed at [facility], [address] [place], [place] [address]   CBC     Status: Abnormal   Collection Time: 02/15/23  5:14 AM  Result Value Ref Range   WBC 15.3 (H) 4.0 - 10.5 K/uL   RBC 4.27 4.22 - 5.81 MIL/uL   Hemoglobin 12.3 (L) 13.0 - 17.0 g/dL   HCT 91.4 (L) 78.2 - 95.6 %   MCV 88.3 80.0 - 100.0 fL   MCH 28.8 26.0 - 34.0 pg   MCHC 32.6 30.0 - 36.0 g/dL   RDW 21.3 08.6 - 57.8 %   Platelets 224 150 - 400 K/uL   nRBC 0.0 0.0 - 0.2 %    Comment: Performed at [facility], [address], [place], [place] [address]  Troponin I (High Sensitivity)     Status: Abnormal   Collection Time: 02/15/23  5:14 AM  Result Value Ref Range   Troponin I (High Sensitivity) 584 (HH) <18 ng/L    Comment: CRITICAL VALUE NOTED. VALUE IS CONSISTENT WITH  PREVIOUSLY REPORTED/CALLED VALUE (NOTE) Elevated high sensitivity troponin I (hsTnI) values and significant  changes across serial measurements may suggest ACS but many other  chronic and acute conditions are known to elevate hsTnI results.  Refer to the "Links" section for chest pain algorithms and additional  guidance. Performed at [facility], [address], [place], [place] [address]   I-Stat CG4 Lactic Acid     Status: Abnormal   Collection Time: 02/15/23  5:20 AM  Result Value Ref Range   Lactic Acid, Venous 4.3 (HH) 0.5 - 1.9 mmol/L   Comment NOTIFIED PHYSICIAN   Troponin I (High Sensitivity)     Status: Abnormal   Collection Time: 02/15/23  6:53 AM  Result Value Ref Range   Troponin I (High Sensitivity) 681 (HH) <18 ng/L    Comment: CRITICAL VALUE NOTED. VALUE IS CONSISTENT WITH PREVIOUSLY REPORTED/CALLED VALUE (NOTE) Elevated high sensitivity troponin I (hsTnI) values and significant  changes across serial measurements may suggest ACS but many other  chronic and acute conditions are known to elevate hsTnI results.  Refer to the "Links" section for chest pain algorithms and additional  guidance. Performed at [facility], [address] [place], [place] [address]   Procalcitonin     Status: None   Collection Time: 02/15/23  6:53 AM  Result Value Ref Range   Procalcitonin 1.11 ng/mL    Comment:        Interpretation: PCT > 0.5 ng/mL and <= 2 ng/mL: Systemic infection (sepsis) is possible, but other conditions are known to elevate PCT as well. (NOTE)  Sepsis PCT Algorithm           Lower Respiratory Tract                                      Infection PCT Algorithm    ----------------------------     ----------------------------         PCT < 0.25 ng/mL                PCT < 0.10 ng/mL          Strongly encourage             Strongly discourage   discontinuation of antibiotics    initiation of antibiotics    ----------------------------      -----------------------------       PCT 0.25 - 0.50 ng/mL            PCT 0.10 - 0.25 ng/mL               OR       >80% decrease in PCT            Discourage initiation of                                            antibiotics      Encourage discontinuation           of antibiotics    ----------------------------     -----------------------------         PCT >= 0.50 ng/mL              PCT 0.26 - 0.50 ng/mL                AND       <80% decrease in PCT             Encourage initiation of                                             antibiotics       Encourage continuation           of antibiotics    ----------------------------     -----------------------------        PCT >= 0.50 ng/mL                  PCT > 0.50 ng/mL               AND         increase in PCT                  Strongly encourage                                      initiation of antibiotics    Strongly encourage escalation           of antibiotics                                     -----------------------------  PCT <= 0.25 ng/mL                                                 OR                                        > 80% decrease in PCT                                      Discontinue / Do not initiate                                             antibiotics  Performed at [facility], [address], [place], [place] [address]   I-Stat CG4 Lactic Acid     Status: Abnormal   Collection Time: 02/15/23  8:05 AM  Result Value Ref Range   Lactic Acid, Venous 3.9 (HH) 0.5 - 1.9 mmol/L   Comment NOTIFIED PHYSICIAN   CBG monitoring, ED     Status: Abnormal   Collection Time: 02/15/23  1:02 PM  Result Value Ref Range   Glucose-Capillary 177 (H) 70 - 99 mg/dL    Comment: Glucose reference range applies only to samples taken after fasting for at least 8 hours.   ECHOCARDIOGRAM COMPLETE  Result Date: 02/15/2023    ECHOCARDIOGRAM REPORT   Patient Name:   Roger Ramirez Date of Exam: 02/15/2023 Medical Rec #:  [id number]             Height:       69.0 in Accession #:    [id number]            Weight:       207.0 lb Date of Birth:  04-15-1945             BSA:          2.096 m Patient Age:    77 years              BP:           118/98 mmHg Patient Gender: M                     HR:           68 bpm. Exam Location:  Inpatient Procedure: 2D Echo, Cardiac Doppler and Color Doppler Indications:    A fib  History:        Patient has prior history of Echocardiogram examinations, most                 recent 09/04/2019. Previous Myocardial Infarction, Aortic Valve                 Disease, Arrythmias:Atrial Fibrillation; Risk Factors:Diabetes                 and Hypertension.  Sonographer:    [name] Morford RDCS (AE, PE) Referring Phys: [id number] CLAUDIA CLAIBORNE IMPRESSIONS  1. Left ventricular ejection fraction, by estimation, is 25%. The left ventricle has normal function. The left ventricle  demonstrates global hypokinesis. Left ventricular diastolic function could not be evaluated.  2. Right ventricular systolic function is normal. The right ventricular size is normal.  3. The mitral valve is degenerative. Mild to moderate mitral valve regurgitation. No evidence of mitral stenosis.  4. The aortic valve is tricuspid. There is moderate calcification of the aortic valve. Aortic valve regurgitation is not visualized. Severe aortic valve stenosis. Aortic valve mean gradient measures 38.0 mmHg. Aortic valve Vmax measures 3.72 m/s. Conclusion(s)/Recommendation(s): Limited images due to poor sound wave transmission. EF is severely reduced and there is severe AS (gradient likely underestimated due to severely reduced EF). FINDINGS  Left Ventricle: Left ventricular ejection fraction, by estimation, is 25%. The left ventricle has normal function. The left ventricle demonstrates global hypokinesis. The left ventricular internal cavity size was normal in size. There is no left ventricular hypertrophy.  Left ventricular diastolic function could not be evaluated due to atrial fibrillation. Left ventricular diastolic function could not be evaluated. Right Ventricle: The right ventricular size is normal. No increase in right ventricular wall thickness. Right ventricular systolic function is normal. Left Atrium: Left atrial size was normal in size. Right Atrium: Right atrial size was normal in size. Pericardium: There is no evidence of pericardial effusion. Mitral Valve: The mitral valve is degenerative in appearance. Mild to moderate mitral valve regurgitation. No evidence of mitral valve stenosis. Tricuspid Valve: The tricuspid valve is normal in structure. Tricuspid valve regurgitation is trivial. No evidence of tricuspid stenosis. Aortic Valve: The aortic valve is tricuspid. There is moderate calcification of the aortic valve. Aortic valve regurgitation is not visualized. Severe aortic stenosis is present. Aortic valve mean gradient measures 38.0 mmHg. Aortic valve peak gradient measures 55.4 mmHg. Aortic valve area, by VTI measures 0.50 cm. Pulmonic Valve: The pulmonic valve was not well visualized. Pulmonic valve regurgitation is not visualized. No evidence of pulmonic stenosis. Aorta: The aortic root is normal in size and structure. IAS/Shunts: No atrial level shunt detected by color flow Doppler.  LEFT VENTRICLE PLAX 2D LVIDd:         6.05 cm   Diastology LVIDs:         5.10 cm   LV e' medial:    2.94 cm/s LV PW:         0.85 cm   LV E/e' medial:  35.7 LV IVS:        0.85 cm   LV e' lateral:   7.40 cm/s LVOT diam:     2.20 cm   LV E/e' lateral: 14.2 LV SV:         45 LV SV Index:   22 LVOT Area:     3.80 cm  RIGHT VENTRICLE RV S prime:     9.36 cm/s TAPSE (M-mode): 2.5 cm LEFT ATRIUM             Index        RIGHT ATRIUM           Index LA diam:        3.25 cm 1.55 cm/m   RA Area:     10.50 cm LA Vol (A2C):   51.7 ml 24.66 ml/m  RA Volume:   22.30 ml  10.64 ml/m LA Vol (A4C):   57.2 ml 27.28 ml/m LA  Biplane Vol: 55.5 ml 26.47 ml/m  AORTIC VALVE AV Area (Vmax):    0.53 cm AV Area (Vmean):   0.51 cm AV Area (VTI):     0.50 cm AV Vmax:  372.00 cm/s AV Vmean:          291.000 cm/s AV VTI:            0.913 m AV Peak Grad:      55.4 mmHg AV Mean Grad:      38.0 mmHg LVOT Vmax:         51.75 cm/s LVOT Vmean:        38.900 cm/s LVOT VTI:          0.119 m LVOT/AV VTI ratio: 0.13  AORTA Ao Root diam: 2.90 cm MITRAL VALVE                TRICUSPID VALVE MV Area (PHT): 4.83 cm     TR Peak grad:   16.2 mmHg MV Decel Time: 157 msec     TR Vmax:        201.00 cm/s MV E velocity: 105.00 cm/s                             SHUNTS                             Systemic VTI:  0.12 m                             Systemic Diam: 2.20 cm Arvilla Meres MD Electronically signed by Arvilla Meres MD Signature Date/Time: 02/15/2023/11:47:39 AM    Final    DG Chest Port 1 View  Result Date: 02/14/2023 CLINICAL DATA:  Shortness of breath EXAM: PORTABLE CHEST 1 VIEW COMPARISON:  06/06/2018 FINDINGS: Mild bibasilar opacities, likely atelectasis, less likely pneumonia. Suspected small left pleural effusion. No pneumothorax. Heart is normal in size.  Thoracic aortic atherosclerosis. IMPRESSION: Mild bibasilar opacities, likely atelectasis, less likely pneumonia. Suspected small left pleural effusion. Electronically Signed   By: Charline Bills M.D.   On: 02/14/2023 21:05    Pending Labs Unresulted Labs (From admission, onward)     Start     Ordered   02/16/23 0500  Procalcitonin  Tomorrow morning,   R       References:    Procalcitonin Lower Respiratory Tract Infection AND Sepsis Procalcitonin Algorithm   02/15/23 0751   02/16/23 0500  Hemoglobin A1c  Tomorrow morning,   R        02/15/23 1114   02/15/23 0500  Protime-INR  Daily,   R      02/15/23 0348   02/15/23 0500  CBC  Daily,   R      02/15/23 0348   02/14/23 2244  Urine Culture  Once,   R        02/14/23 2244   02/14/23 2128  Blood culture (routine x 2)   BLOOD CULTURE X 2,   R (with STAT occurrences)      02/14/23 2127            Vitals/Pain Today's Vitals   02/15/23 0925 02/15/23 1145 02/15/23 1245 02/15/23 1257  BP: (!) 118/98 106/79 (!) 134/93   Pulse: 72 62 68   Resp: (!) 23 (!) 21 (!) 26   Temp:    98.1 F (36.7 C)  TempSrc:    Oral  SpO2: 95% 99% 100%   PainSc:        Isolation Precautions No active isolations  Medications Medications  acetaminophen (TYLENOL) tablet 650  mg (has no administration in time range)  ondansetron (ZOFRAN) injection 4 mg (has no administration in time range)  furosemide (LASIX) injection 20 mg (20 mg Intravenous Given 02/15/23 0926)  metoprolol succinate (TOPROL-XL) 24 hr tablet 25 mg (25 mg Oral Given 02/15/23 0925)  spironolactone (ALDACTONE) tablet 25 mg (has no administration in time range)  levothyroxine (SYNTHROID) tablet 75 mcg (75 mcg Oral Given 02/15/23 0522)  clopidogrel (PLAVIX) tablet 75 mg (75 mg Oral Given 02/15/23 0924)  melatonin tablet 3 mg (3 mg Oral Given 02/15/23 0149)  Warfarin - Pharmacist Dosing Inpatient (has no administration in time range)  warfarin (COUMADIN) tablet 5 mg (has no administration in time range)  cefTRIAXone (ROCEPHIN) 2 g in sodium chloride 0.9 % 100 mL IVPB (0 g Intravenous Stopped 02/15/23 1010)  metoprolol tartrate (LOPRESSOR) injection 2.5 mg (has no administration in time range)  insulin aspart (novoLOG) injection 0-9 Units (2 Units Subcutaneous Given 02/15/23 1230)  insulin aspart (novoLOG) injection 0-5 Units (has no administration in time range)  atorvastatin (LIPITOR) tablet 40 mg (has no administration in time range)  diltiazem (CARDIZEM) 1 mg/mL load via infusion 10 mg (10 mg Intravenous Bolus from Bag 02/14/23 2202)  cefTRIAXone (ROCEPHIN) 1 g in sodium chloride 0.9 % 100 mL IVPB (0 g Intravenous Stopped 02/14/23 2233)  azithromycin (ZITHROMAX) 500 mg in dextrose 5 % 250 mL IVPB (0 mg Intravenous Stopped 02/14/23 2342)  furosemide (LASIX) injection  40 mg (40 mg Intravenous Given 02/14/23 2237)  potassium chloride 10 mEq in 100 mL IVPB (0 mEq Intravenous Stopped 02/15/23 0854)    Mobility non-ambulatory     Focused Assessments Cardiac Assessment Handoff:  Cardiac Rhythm: Atrial fibrillation Lab Results  Component Value Date   TROPONINI 0.78 (HH) 12/25/2014   Lab Results  Component Value Date   DDIMER 7.19 (H) 12/24/2014   Does the Patient currently have chest pain? No   , Neuro Assessment Handoff:  Swallow screen pass? Yes  Cardiac Rhythm: Atrial fibrillation       Neuro Assessment:   Neuro Checks:      Has TPA been given? No If patient is a Neuro Trauma and patient is going to OR before floor call report to 4N Charge nurse: [phone] or [phone]  , Pulmonary Assessment Handoff:  Lung sounds: Bilateral Breath Sounds: Diminished L Breath Sounds: Expiratory wheezes O2 Device: Nasal Cannula O2 Flow Rate (L/min): 4 L/min    R Recommendations: See Admitting Provider Note  Report given to:   Additional Notes:

## 2023-02-15 NOTE — Assessment & Plan Note (Signed)
TSH normal - Continue LT4

## 2023-02-15 NOTE — Assessment & Plan Note (Signed)
Continue warfarin.

## 2023-02-15 NOTE — Assessment & Plan Note (Signed)
BP soft - Cautiously continue metoprolol, Lasix, spironolactone

## 2023-02-15 NOTE — Progress Notes (Signed)
PHARMACY - PHYSICIAN COMMUNICATION CRITICAL VALUE ALERT - BLOOD CULTURE IDENTIFICATION (BCID)  Roger Ramirez is an 77 y.o. male who presented from SNF to [facility] on 02/14/2023 with a chief complaint of dyspnea and hypoxia found to have Afib RVR, suspected pneumonia, and CHF.   Assessment:  1 out of 4 bottles with GPC. BCID showing staph species, no resistance detected.   Name of physician (or Provider) Contacted: Dr. Maryfrances Bunnell  Current antibiotics: Ceftriaxone for CAP  Changes to prescribed antibiotics recommended:  Patient is on recommended antibiotics - No changes needed  No results found for this or any previous visit.  Link Snuffer, PharmD, BCPS, BCCCP Please refer to [facility] for [facility] numbers 02/15/2023  6:41 PM

## 2023-02-15 NOTE — Progress Notes (Signed)
  Progress Note   Patient: Roger Ramirez WUJ:[id number] DOB: April 18, 1945 DOA: 02/14/2023     1 DOS: the patient was seen and examined on 02/15/2023 at 8:20AM      Brief hospital course: 77 y.o. M with dementia, facility-dwelling, PE and APLS on warfarin, hx CVA and AS and MI who presented with dyspnea, hypoxia.  Found to have Afib RVR suspected pneumonia and CHF.     Assessment and Plan: * Aspiration pneumonia (HCC) Lactate >4 but no end organ damage (metnation at baseline, blood counts normal), suspect this is driven by respiratory distress, metformin. - Trend Lactate - Continue Rocephin - Follow blood and urine cultures - Trend lactate and procal    Acute on chronic diastolic CHF (congestive heart failure) (HCC) - Continue IV furosemide, cautiously given infection, diagnostic uncertainty -Strict I/Os, daily weights, telemetry  -Daily monitoring renal function and K -Obtain Echo   New onset atrial fibrillation with RVR (HCC) Transiently in rapid Afib, new diagnosis.  Back in sinus now.  On permanent warfarin for APLS already. - Stop diltiazem drip - Transition back to home metoprolol    Dementia without behavioral disturbance (HCC) At mental baseline it appears - Standard delirium precautions: blinds open and lights on during day, TV off, minimize interruptions at night, glasses/hearing aids, PT/OT, avoiding Beers list medications    Chronic anticoagulation    Resolved ischemic cerebrovascular accident (CVA) involving left anterior cerebral artery territory in remote past - Continue Plavix - Resume lipitor  Essential hypertension BP soft - Cautiously continue metoprolol, Lasix, spironolactone  Obesity (BMI 30.0-34.9) BMI 30  Diabetes mellitus type 2, noninsulin dependent (HCC) - Start SS corrections - Hold metformin - Check A1c  Antiphospholipid antibody syndrome (HCC) See above  Hypothyroidism TSH normal - Continue LT4  History of pulmonary  embolus (PE) - Continue warfarin          Subjective: Patient feels "okay", no confusion.  No fever.  Breathing fast, wants the mitts off.  Pleasantly confused.  No nursing concerns reported.  Back in sinus already this mornin     Physical Exam: BP (!) 118/98   Pulse 72   Temp 97.9 F (36.6 C) (Oral)   Resp (!) 23   SpO2 95%   Elderly adult male, lying in bed, appears weak and tired, tachypneic RRR, no murmurs, no peripheral edema Respiratory rate very fast, using some paradoxical muscles at times, no wheezing or rales, overall diminished Abdomen soft no tenderness palpation or guarding in any quadrant Attention distracted, due to dementia, but makes eye contact, answers questions, hard of hearing, upper extremity strength generally weak but symmetric, face symmetric, speech fluent, oriented to self and daughter only    Data Reviewed: Patient metabolic panel unremarkable CBC only remarkable for leukocytosis Pro-Cal and echocardiogram pending  Family Communication: Daughter at the bedside    Disposition: Status is: Inpatient         Author: Alberteen Sam, MD 02/15/2023 11:15 AM  For on call review [email or web address].

## 2023-02-15 NOTE — Hospital Course (Addendum)
77 y.o. M with dementia, facility-dwelling, PE and APLS on warfarin, hx CVA and AS and MI who presented with dyspnea, hypoxia.  Found to have Afib RVR suspected pneumonia and CHF.

## 2023-02-15 NOTE — Assessment & Plan Note (Signed)
BMI >30 

## 2023-02-15 NOTE — Assessment & Plan Note (Signed)
-   Continue Plavix - Resume lipitor

## 2023-02-15 NOTE — Assessment & Plan Note (Signed)
At mental baseline it appears - Standard delirium precautions: blinds open and lights on during day, TV off, minimize interruptions at night, glasses/hearing aids, PT/OT, avoiding Beers list medications

## 2023-02-15 NOTE — ED Notes (Signed)
Patient transported to ECHO.

## 2023-02-15 NOTE — Assessment & Plan Note (Signed)
Transiently in rapid Afib, new diagnosis.  Back in sinus now.  On permanent warfarin for APLS already. - Stop diltiazem drip - Transition back to home metoprolol

## 2023-02-15 NOTE — Plan of Care (Signed)

## 2023-02-15 NOTE — ED Notes (Signed)
Mitts noted on pt at this time due to pt trying to take iv out of his arm

## 2023-02-15 NOTE — Evaluation (Signed)
Clinical/Bedside Swallow Evaluation Patient Details  Name: Roger Ramirez MRN: [id number] Date of Birth: Apr 24, 1945  Today's Date: 02/15/2023 Time: SLP Start Time (ACUTE ONLY): 1134 SLP Stop Time (ACUTE ONLY): 1144 SLP Time Calculation (min) (ACUTE ONLY): 10 min  Past Medical History:  Past Medical History:  Diagnosis Date   Antiphospholipid syndrome (HCC)    Cognitive deficits    CVA (cerebral vascular accident) (HCC) 2020   2020 and 07/2019   Diabetes mellitus (HCC)    Hypertension    Hypothyroidism    Moderate aortic stenosis    Pulmonary embolism (HCC)    Tobacco abuse    Past Surgical History: History reviewed. No pertinent surgical history. HPI:  77 y.o. M with dementia, facility-dwelling, PE and APLS on warfarin, hx CVA and AS and MI who presented with dyspnea, hypoxia.  Found to have Afib RVR suspected pneumonia and CHF.    Assessment / Plan / Recommendation  Clinical Impression   Pt presents with a grossly functional oropharyngeal swallow function per clinical swallow assessment completed today. Per daughter, pt is on a soft diet and thin liquids at baseline. Solids are modified due to many broken and missing teeth; otherwise, she reported no known hx of dysphagia.   Pt presented with thin liquids, applesauce, softened cracker, and regular cracker +peanut butter. Oral prep and transit prompt with complete oral clearance. No anterior loss of PO trials observed. Pharyngeal swallow initiation appeared prompt with laryngeal elevation noted. No overt or subtle s/s of aspiration observed across trials.   Recommend diet advancement to a mechanical soft diet and thin liquids as tolerated. PO meds as tolerated.   Plan: SLP will follow up x1 to assess diet tolerance and modify diet as indicated. Will proceed with instrumental swallow assessment if there are any concerns for aspiration with PO intake.   SLP Visit Diagnosis:  (r/o dysphagia)    Aspiration Risk  Mild  aspiration risk    Diet Recommendation Dysphagia 3 (Mech soft);Thin liquid    Liquid Administration via: Cup;Straw Medication Administration: Whole meds with liquid (as tolerated) Supervision: Staff to assist with self feeding Compensations: Minimize environmental distractions;Slow rate;Small sips/bites Postural Changes: Seated upright at 90 degrees    Other  Recommendations Oral Care Recommendations: Oral care BID    Recommendations for follow up therapy are one component of a multi-disciplinary discharge planning process, led by the attending physician.  Recommendations may be updated based on patient status, additional functional criteria and insurance authorization.  Follow up Recommendations  (TBD, though anticipate no SLP services needed post d/c from hospital)      Assistance Recommended at Discharge  Full supervision   Functional Status Assessment Patient has had a recent decline in their functional status and demonstrates the ability to make significant improvements in function in a reasonable and predictable amount of time.  Frequency and Duration min 1 x/week  1 week       Prognosis Prognosis for improved oropharyngeal function: Good      Swallow Study   General Date of Onset: 02/14/23 HPI: 77 y.o. M with dementia, facility-dwelling, PE and APLS on warfarin, hx CVA and AS and MI who presented with dyspnea, hypoxia.  Found to have Afib RVR suspected pneumonia and CHF. Type of Study: Bedside Swallow Evaluation Previous Swallow Assessment: none per chart Diet Prior to this Study: NPO Temperature Spikes Noted: No Respiratory Status: Nasal cannula (2L) History of Recent Intubation: No Behavior/Cognition: Alert;Cooperative;Pleasant mood Oral Cavity Assessment: Within Functional Limits Oral Cavity -  Dentition: Poor condition;Missing dentition Self-Feeding Abilities: Needs assist Patient Positioning: Upright in bed Baseline Vocal Quality: Normal Volitional Swallow: Able  to elicit    Oral/Motor/Sensory Function Overall Oral Motor/Sensory Function: Within functional limits   Ice Chips Ice chips: Not tested   Thin Liquid Thin Liquid: Within functional limits Presentation: Straw    Nectar Thick Nectar Thick Liquid: Not tested   Honey Thick Honey Thick Liquid: Not tested   Puree Puree: Within functional limits Presentation: Spoon   Solid     Solid: Within functional limits      Roger Ramirez 02/15/2023,12:15 PM

## 2023-02-15 NOTE — Assessment & Plan Note (Signed)
A1c 6% Glucoses okay - Continue SS corrections - Hold metformin

## 2023-02-15 NOTE — Assessment & Plan Note (Signed)
Lactic acidosis Presented with cough, respiratory distress, multifocal infiltrates, elevated procalcitonin, and leukocytosis.   As outlined yesterday, suspect the lactic acidosis was mainly due to respiratory distress, advanced aortic stenosis, metformin, not sepsis. - Continue Rocephin - Trend procalcitonin - Follow urine and blood cultures

## 2023-02-15 NOTE — Assessment & Plan Note (Signed)
See above

## 2023-02-16 ENCOUNTER — Inpatient Hospital Stay (HOSPITAL_COMMUNITY): Payer: Medicare Other

## 2023-02-16 DIAGNOSIS — F0390 Unspecified dementia without behavioral disturbance: Secondary | ICD-10-CM | POA: Diagnosis not present

## 2023-02-16 DIAGNOSIS — J690 Pneumonitis due to inhalation of food and vomit: Secondary | ICD-10-CM | POA: Diagnosis not present

## 2023-02-16 DIAGNOSIS — I350 Nonrheumatic aortic (valve) stenosis: Secondary | ICD-10-CM | POA: Diagnosis not present

## 2023-02-16 DIAGNOSIS — E119 Type 2 diabetes mellitus without complications: Secondary | ICD-10-CM | POA: Diagnosis not present

## 2023-02-16 DIAGNOSIS — I5033 Acute on chronic diastolic (congestive) heart failure: Secondary | ICD-10-CM | POA: Diagnosis not present

## 2023-02-16 LAB — GLUCOSE, CAPILLARY
Glucose-Capillary: 113 mg/dL — ABNORMAL HIGH (ref 70–99)
Glucose-Capillary: 137 mg/dL — ABNORMAL HIGH (ref 70–99)
Glucose-Capillary: 148 mg/dL — ABNORMAL HIGH (ref 70–99)
Glucose-Capillary: 198 mg/dL — ABNORMAL HIGH (ref 70–99)

## 2023-02-16 LAB — COMPREHENSIVE METABOLIC PANEL
ALT: 18 U/L (ref 0–44)
AST: 29 U/L (ref 15–41)
Albumin: 3 g/dL — ABNORMAL LOW (ref 3.5–5.0)
Alkaline Phosphatase: 67 U/L (ref 38–126)
Anion gap: 9 (ref 5–15)
BUN: 29 mg/dL — ABNORMAL HIGH (ref 8–23)
CO2: 21 mmol/L — ABNORMAL LOW (ref 22–32)
Calcium: 8.6 mg/dL — ABNORMAL LOW (ref 8.9–10.3)
Chloride: 104 mmol/L (ref 98–111)
Creatinine, Ser: 1.02 mg/dL (ref 0.61–1.24)
GFR, Estimated: >60 mL/min (ref >60)
Glucose, Bld: 135 mg/dL — ABNORMAL HIGH (ref 70–99)
Potassium: 4.3 mmol/L (ref 3.5–5.1)
Sodium: 134 mmol/L — ABNORMAL LOW (ref 135–145)
Total Bilirubin: 0.7 mg/dL (ref <1.2)
Total Protein: 6.4 g/dL — ABNORMAL LOW (ref 6.5–8.1)

## 2023-02-16 LAB — CBC
HCT: 37.6 % — ABNORMAL LOW (ref 39.0–52.0)
Hemoglobin: 12.2 g/dL — ABNORMAL LOW (ref 13.0–17.0)
MCH: 29.1 pg (ref 26.0–34.0)
MCHC: 32.4 g/dL (ref 30.0–36.0)
MCV: 89.7 fL (ref 80.0–100.0)
Platelets: 211 10*3/uL (ref 150–400)
RBC: 4.19 MIL/uL — ABNORMAL LOW (ref 4.22–5.81)
RDW: 15.7 % — ABNORMAL HIGH (ref 11.5–15.5)
WBC: 14.7 10*3/uL — ABNORMAL HIGH (ref 4.0–10.5)
nRBC: 0 % (ref 0.0–0.2)

## 2023-02-16 LAB — PROCALCITONIN: Procalcitonin: 1.24 ng/mL

## 2023-02-16 LAB — HEMOGLOBIN A1C
Hgb A1c MFr Bld: 6 % — ABNORMAL HIGH (ref 4.8–5.6)
Mean Plasma Glucose: 125.5 mg/dL

## 2023-02-16 LAB — PROTIME-INR
INR: 3.1 — ABNORMAL HIGH (ref 0.8–1.2)
Prothrombin Time: 32.3 s — ABNORMAL HIGH (ref 11.4–15.2)

## 2023-02-16 LAB — LACTIC ACID, PLASMA: Lactic Acid, Venous: 1.8 mmol/L (ref 0.5–1.9)

## 2023-02-16 MED ORDER — FUROSEMIDE 10 MG/ML IJ SOLN
INTRAMUSCULAR | Status: AC
  Filled 2023-02-16: qty 4

## 2023-02-16 MED ORDER — METOPROLOL SUCCINATE ER 25 MG PO TB24
25.0000 mg | ORAL_TABLET | Freq: Every day | ORAL | Status: DC
  Filled 2023-02-16: qty 1

## 2023-02-16 MED ORDER — FUROSEMIDE 10 MG/ML IJ SOLN
40.0000 mg | Freq: Once | INTRAMUSCULAR | Status: AC
  Administered 2023-02-16: 40 mg via INTRAVENOUS

## 2023-02-16 MED ORDER — ALBUTEROL SULFATE (2.5 MG/3ML) 0.083% IN NEBU
2.5000 mg | INHALATION_SOLUTION | RESPIRATORY_TRACT | Status: DC | PRN
  Administered 2023-02-16: 2.5 mg via RESPIRATORY_TRACT
  Filled 2023-02-16: qty 3

## 2023-02-16 MED ORDER — FUROSEMIDE 10 MG/ML IJ SOLN
20.0000 mg | Freq: Once | INTRAMUSCULAR | Status: DC
  Filled 2023-02-16: qty 2

## 2023-02-16 MED ORDER — WARFARIN SODIUM 3 MG PO TABS
3.0000 mg | ORAL_TABLET | Freq: Once | ORAL | Status: AC
  Administered 2023-02-16: 3 mg via ORAL
  Filled 2023-02-16: qty 1

## 2023-02-16 MED ORDER — LORAZEPAM 2 MG/ML IJ SOLN: 0.5000 mg | Freq: Once | INTRAMUSCULAR | Status: DC | PRN

## 2023-02-16 NOTE — Progress Notes (Signed)
Heart Failure Navigator Progress Note  Assessed for Heart & Vascular TOC clinic readiness.  Patient does not meet criteria due to per MD note , history of Dementia. .   Navigator will sign off at this time.   Rhae Hammock, BSN, Scientist, clinical (histocompatibility and immunogenetics) Only

## 2023-02-16 NOTE — Consult Note (Addendum)
Cardiology Consultation   Patient ID: Roger Ramirez MRN: [id number]; DOB: 12/15/1945  Admit date: 02/14/2023 Date of Consult: 02/16/2023  PCP:  Roger Dills, MD   [facility] Providers Cardiologist:  Roger Swaziland, MD    Patient Profile:   Roger Ramirez is a 77 y.o. male with a hx of dementia, PE, antiphospholipid antibody syndrome, CVA, HFrEF, hypertension, diabetes, hypothyroidism who is being seen 02/16/2023 for the evaluation of aortic stenosis/HFrEF at the request of Dr. Maryfrances Ramirez.  History of Present Illness:   Roger Ramirez is a 77 year old male with past medical history noted above.  He was admitted 07/2019 when he presented with altered mental status, right hemiparesis, dysarthria.  CT head was nonacute, MRI showed acute infarction affecting the left body of the corpus callosum.  CT angio head and neck with atherosclerotic irregularity and left A3 ACA with high-grade stenosis with suspected subsequent occlusion.  Echocardiogram during that admission showed LVEF of 60 to 65%, grade 1 diastolic dysfunction, elevated LVEDP, moderate aortic stenosis.  He did not receive tPA.  He was transitioned to Eliquis plus aspirin due to insurance coverage with recommendation to consider Coumadin with higher INR goal if recurrent stroke in the future.  Discharged to CIR.  He was seen by cardiology 08/2019 when he complained of chest pain and mild nausea.  EKG showed inferior ST elevation.  Case was discussed with Dr. Allyson Ramirez, and per notes discussion had with patient and family given his recent CVA, DNR status and high risk of stroke decision was made to pursue conservative management.  Eliquis was held and he was placed on IV heparin.  Troponin peaked at greater than 27,000.  He was transitioned to Coumadin with recommendations for INR goal of 2.5-3.5.  Medication regimen included Plavix, statin, beta-blocker, Coumadin.   Presented to the ED on 11/3 from neph with complaints of  possible aspiration pneumonia. Brought from [facility].   Labs on admission showed sodium 137, potassium 3.5, creatinine 1.15, BNP 684, high-sensitivity troponin 265>> 219>>584>>681, lactic acid peaked at 6, WBC 7.8, hemoglobin 13, INR 2.3.  Chest x-ray with mild bibasilar opacities, likely atelectasis, small left pleural effusion.  UA positive for hemoglobin, leukocytes and nitrates. Telemetry in the ED showed atrial fibrillation with RVR.  EKG 11/4 shows sinus tachycardia, 140 bpm, LVH, ST elevation in anterior leads likely due to LVH.  He was admitted to internal medicine for further management.  Echocardiogram 11/4 with LVEF of 25%, global hypokinesis, normal RV, mild to moderate MR, severe aortic valve stenosis with mean gradient of 38 mmHg, aortic valve area of 0.5 cm.   Cardiology now asked to evaluate.  Spoke with patients daughter, Roger Ramirez, at the bedside. She reports he is mostly in the bed or wheelchair. Normally able to eat himself and hold a conversation. He is more confused since admission. He denies any pain, but she reports he never complains of anything.   Past Medical History:  Diagnosis Date   Antiphospholipid syndrome (HCC)    Cognitive deficits    CVA (cerebral vascular accident) (HCC) 2020   2020 and 07/2019   Diabetes mellitus (HCC)    Hypertension    Hypothyroidism    Moderate aortic stenosis    Pulmonary embolism (HCC)    Tobacco abuse     History reviewed. No pertinent surgical history.   Inpatient Medications: Scheduled Meds:  atorvastatin  40 mg Oral Daily   clopidogrel  75 mg Oral Daily   furosemide  20 mg Intravenous  Daily   insulin aspart  0-5 Units Subcutaneous QHS   insulin aspart  0-9 Units Subcutaneous TID WC   levothyroxine  75 mcg Oral Daily   melatonin  3 mg Oral QHS   [START ON 02/17/2023] metoprolol succinate  25 mg Oral Daily   spironolactone  25 mg Oral Daily   warfarin  3 mg Oral ONCE-1600   Warfarin - Pharmacist Dosing Inpatient   Does  not apply q1600   Continuous Infusions:  cefTRIAXone (ROCEPHIN)  IV 2 g (02/16/23 1234)   PRN Meds: acetaminophen, metoprolol tartrate, ondansetron (ZOFRAN) IV  Allergies:   No Known Allergies  Social History:   Social History   Socioeconomic History   Marital status: Divorced    Spouse name: Not on file   Number of children: 2   Years of education: Not on file   Highest education level: Not on file  Occupational History   Occupation: retired  Tobacco Use   Smoking status: Former    Current packs/day: 1.00    Average packs/day: 1 pack/day for 20.0 years (20.0 ttl pk-yrs)    Types: Cigarettes   Smokeless tobacco: Never   Tobacco comments:    Quit 1970's  Substance and Sexual Activity   Alcohol use: Not Currently    Alcohol/week: 0.0 standard drinks of alcohol    Comment: Occasional beer / 1 every 2 or 3 weeks   Drug use: No   Sexual activity: Not on file  Other Topics Concern   Not on file  Social History Narrative   05/29/21 lives at [facility]   Owned a Public librarian company   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   All other ROS reviewed and negative.     Physical Exam/Data:   Vitals:   02/16/23 0408 02/16/23 0729 02/16/23 1043 02/16/23 1103  BP: 97/75 106/82 107/69 102/88  Pulse: 75 81 80 77  Resp: 20 (!) 22  (!) 23  Temp: 97.6 F (36.4 C)   98.1 F (36.7 C)  TempSrc: Oral   Oral  SpO2: 99% 98%  100%  Weight:       Height:        Intake/Output Summary (Last 24 hours) at 02/16/2023 1440 Last data filed at 02/16/2023 1303 Gross per 24 hour  Intake 417 ml  Output 400 ml  Net 17 ml      02/16/2023   12:41 AM 02/15/2023    3:08 PM 05/29/2021    3:41 PM  Last 3 Weights  Weight (lbs) 218 lb 0.6 oz 210 lb 15.7 oz --  Weight (kg) 98.9 kg 95.7 kg --     Body mass index is 31.28 kg/m.  General:  Well nourished, well developed, mildly dyspneic at rest HEENT: normal Neck: + JVD Vascular: No carotid bruits; Distal pulses 2+ bilaterally Cardiac:  normal S1, S2; RRR; +  systolic murmur  Lungs:  Diminished in bases Abd: soft, nontender, no hepatomegaly  Ext: 1+ bilateral LE edema Musculoskeletal:  No deformities, BUE and BLE strength normal and equal Skin: warm and dry  Neuro:  CNs 2-12 intact, no focal abnormalities noted Psych:  Normal affect   EKG:  The EKG was personally reviewed and demonstrates:  Sinus Tachycardia 140 bpm, LVH Telemetry:  Telemetry was personally reviewed and demonstrates:  initially atrial fibrillation with RVR, conversion to sinus rhythm  Relevant CV Studies:  Echo: 02/15/2023  IMPRESSIONS     1. Left ventricular ejection fraction, by estimation, is 25%. The left  ventricle has normal function. The left ventricle demonstrates global  hypokinesis. Left ventricular diastolic function could not be evaluated.   2. Right ventricular systolic function is normal. The right ventricular  size is normal.   3. The mitral valve is degenerative. Mild to moderate mitral valve  regurgitation. No evidence of mitral stenosis.   4. The aortic valve is tricuspid. There is moderate calcification of the  aortic valve. Aortic valve regurgitation is not visualized. Severe aortic  valve stenosis. Aortic valve mean gradient measures 38.0 mmHg. Aortic  valve Vmax measures 3.72 m/s.   Conclusion(s)/Recommendation(s): Limited images due to poor sound wave  transmission. EF is severely reduced  and there is severe AS (gradient  likely underestimated due to severely reduced EF).   FINDINGS   Left Ventricle: Left ventricular ejection fraction, by estimation, is  25%. The left ventricle has normal function. The left ventricle  demonstrates global hypokinesis. The left ventricular internal cavity size  was normal in size. There is no left  ventricular hypertrophy. Left ventricular diastolic function could not be  evaluated due to atrial fibrillation. Left ventricular diastolic function  could not be evaluated.   Right Ventricle: The right ventricular size is normal. No increase in  right ventricular wall thickness. Right ventricular systolic function is  normal.   Left Atrium: Left atrial size was normal in size.   Right Atrium: Right atrial size was normal in size.   Pericardium: There is no evidence of pericardial effusion.   Mitral Valve: The mitral valve is degenerative in appearance. Mild to  moderate mitral valve regurgitation. No evidence of mitral valve stenosis.   Tricuspid Valve: The tricuspid valve is normal in structure. Tricuspid  valve regurgitation is trivial. No evidence of tricuspid stenosis.   Aortic Valve: The aortic valve is tricuspid. There is moderate  calcification of the aortic valve. Aortic valve regurgitation is not  visualized. Severe aortic stenosis is present. Aortic valve mean gradient  measures 38.0 mmHg. Aortic valve peak gradient  measures 55.4 mmHg. Aortic valve area, by VTI measures 0.50 cm.   Pulmonic Valve: The pulmonic valve was not well visualized. Pulmonic valve  regurgitation is not visualized. No evidence of pulmonic stenosis.   Aorta: The aortic root is normal in size and structure.    Laboratory Data:  High Sensitivity Troponin:   Recent Labs  Lab 02/14/23 2039 02/14/23 2239 02/15/23 0514 02/15/23 0653  TROPONINIHS 265* 219* 584* 681*     Chemistry Recent Labs  Lab 02/14/23 2039 02/14/23 2052 02/14/23 2055  02/15/23 0311 02/16/23 0455  NA 137   < > 137 136 134*  K 3.5   < > 3.4* 3.9 4.3  CL 101  --  103 104 104  CO2 23  --   --  19* 21*  GLUCOSE 206*  --  211* 198* 135*  BUN 17  --  20 19 29*  CREATININE 1.15  --  0.90 1.15 1.02  CALCIUM 9.3  --   --  9.0 8.6*  GFRNONAA >60  --   --  >60 >60  ANIONGAP 13  --   --  13 9   < > = values in this interval not displayed.    Recent Labs  Lab 02/14/23 2039 02/16/23 0455  PROT 7.1 6.4*  ALBUMIN 3.3* 3.0*  AST 35 29  ALT 20 18  ALKPHOS 78 67  BILITOT 1.0 0.7   Lipids No results for input(s): "CHOL", "TRIG", "HDL", "LABVLDL", "LDLCALC", "CHOLHDL" in the last 168 hours.  Hematology Recent Labs  Lab 02/15/23 0311 02/15/23 0514 02/16/23 0455  WBC 16.2* 15.3* 14.7*  RBC 4.25 4.27 4.19*  HGB 12.5* 12.3* 12.2*  HCT 38.0* 37.7* 37.6*  MCV 89.4 88.3 89.7  MCH 29.4 28.8 29.1  MCHC 32.9 32.6 32.4  RDW 15.5 15.5 15.7*  PLT 226 224 211   Thyroid  Recent Labs  Lab 02/15/23 0311  TSH 1.034    BNP Recent Labs  Lab 02/14/23 2041  BNP 684.2*    DDimer No results for input(s): "DDIMER" in the last 168 hours.   Radiology/Studies:  ECHOCARDIOGRAM COMPLETE  Result Date: 02/15/2023    ECHOCARDIOGRAM REPORT   Patient Name:   Roger Ramirez Date of Exam: 02/15/2023 Medical Rec #:  [id number]             Height:       69.0 in Accession #:    [id number]            Weight:       207.0 lb Date of Birth:  01-20-1946             BSA:          2.096 m Patient Age:    77 years              BP:           118/98 mmHg Patient Gender: M                     HR:           68 bpm. Exam Location:  Inpatient Procedure: 2D Echo, Cardiac Doppler and Color Doppler Indications:    A fib  History:        Patient has prior history of Echocardiogram examinations, most                 recent 09/04/2019. Previous Myocardial Infarction, Aortic Valve                 Disease, Arrythmias:Atrial Fibrillation; Risk Factors:Diabetes                 and Hypertension.   Sonographer:    [name] Morford RDCS (AE, PE) Referring Phys: [id number] CLAUDIA CLAIBORNE IMPRESSIONS  1. Left ventricular ejection fraction, by estimation, is 25%. The left ventricle has normal function. The left ventricle demonstrates global hypokinesis. Left ventricular diastolic function could not be evaluated.  2. Right ventricular systolic function is normal. The right ventricular size is normal.  3. The mitral valve is degenerative. Mild to moderate mitral valve regurgitation. No evidence of mitral stenosis.  4. The aortic valve is tricuspid. There is moderate calcification of the aortic valve. Aortic valve regurgitation is not visualized. Severe aortic valve stenosis. Aortic valve mean gradient measures 38.0 mmHg. Aortic valve Vmax measures 3.72 m/s. Conclusion(s)/Recommendation(s): Limited  images due to poor sound wave transmission. EF is severely reduced and there is severe AS (gradient likely underestimated due to severely reduced EF). FINDINGS  Left Ventricle: Left ventricular ejection fraction, by estimation, is 25%. The left ventricle has normal function. The left ventricle demonstrates global hypokinesis. The left ventricular internal cavity size was normal in size. There is no left ventricular hypertrophy. Left ventricular diastolic function could not be evaluated due to atrial fibrillation. Left ventricular diastolic function could not be evaluated. Right Ventricle: The right ventricular size is normal. No increase in right ventricular wall thickness. Right ventricular systolic function is normal. Left Atrium: Left atrial size was normal in size. Right Atrium: Right atrial size was normal in size. Pericardium: There is no evidence of pericardial effusion. Mitral Valve: The mitral valve is degenerative in appearance. Mild to moderate mitral valve regurgitation. No evidence of mitral valve stenosis. Tricuspid Valve: The tricuspid valve is normal in structure. Tricuspid valve regurgitation is trivial. No  evidence of tricuspid stenosis. Aortic Valve: The aortic valve is tricuspid. There is moderate calcification of the aortic valve. Aortic valve regurgitation is not visualized. Severe aortic stenosis is present. Aortic valve mean gradient measures 38.0 mmHg. Aortic valve peak gradient measures 55.4 mmHg. Aortic valve area, by VTI measures 0.50 cm. Pulmonic Valve: The pulmonic valve was not well visualized. Pulmonic valve regurgitation is not visualized. No evidence of pulmonic stenosis. Aorta: The aortic root is normal in size and structure. IAS/Shunts: No atrial level shunt detected by color flow Doppler.  LEFT VENTRICLE PLAX 2D LVIDd:         6.05 cm   Diastology LVIDs:         5.10 cm   LV e' medial:    2.94 cm/s LV PW:         0.85 cm   LV E/e' medial:  35.7 LV IVS:        0.85 cm   LV e' lateral:   7.40 cm/s LVOT diam:     2.20 cm   LV E/e' lateral: 14.2 LV SV:         45 LV SV Index:   22 LVOT Area:     3.80 cm  RIGHT VENTRICLE RV S prime:     9.36 cm/s TAPSE (M-mode): 2.5 cm LEFT ATRIUM             Index        RIGHT ATRIUM           Index LA diam:        3.25 cm 1.55 cm/m   RA Area:     10.50 cm LA Vol (A2C):   51.7 ml 24.66 ml/m  RA Volume:   22.30 ml  10.64 ml/m LA Vol (A4C):   57.2 ml 27.28 ml/m LA Biplane Vol: 55.5 ml 26.47 ml/m  AORTIC VALVE AV Area (Vmax):    0.53 cm AV Area (Vmean):   0.51 cm AV Area (VTI):     0.50 cm AV Vmax:           372.00 cm/s AV Vmean:          291.000 cm/s AV VTI:            0.913 m AV Peak Grad:      55.4 mmHg AV Mean Grad:      38.0 mmHg LVOT Vmax:         51.75 cm/s LVOT Vmean:        38.900 cm/s LVOT  VTI:          0.119 m LVOT/AV VTI ratio: 0.13  AORTA Ao Root diam: 2.90 cm MITRAL VALVE                TRICUSPID VALVE MV Area (PHT): 4.83 cm     TR Peak grad:   16.2 mmHg MV Decel Time: 157 msec     TR Vmax:        201.00 cm/s MV E velocity: 105.00 cm/s                             SHUNTS                             Systemic VTI:  0.12 m                              Systemic Diam: 2.20 cm Arvilla Meres MD Electronically signed by Arvilla Meres MD Signature Date/Time: 02/15/2023/11:47:39 AM    Final    DG Chest Port 1 View  Result Date: 02/14/2023 CLINICAL DATA:  Shortness of breath EXAM: PORTABLE CHEST 1 VIEW COMPARISON:  06/06/2018 FINDINGS: Mild bibasilar opacities, likely atelectasis, less likely pneumonia. Suspected small left pleural effusion. No pneumothorax. Heart is normal in size.  Thoracic aortic atherosclerosis. IMPRESSION: Mild bibasilar opacities, likely atelectasis, less likely pneumonia. Suspected small left pleural effusion. Electronically Signed   By: Charline Bills M.D.   On: 02/14/2023 21:05     Assessment and Plan:   Roger Ramirez is a 77 y.o. male with a hx of dementia, PE, antiphospholipid antibody syndrome, CVA, HFrEF, hypertension, diabetes, hypothyroidism who is being seen 02/16/2023 for the evaluation of aortic stenosis/HFrEF at the request of Dr. Maryfrances Ramirez.  Severe AS -- presented with acute CHF, echo this admission shows severe aortic valve stenosis with mean gradient of 38 mmHg, aortic valve area of 0.5 cm -- long discussion with family at the bedside regarding aortic stenosis and work up involved. Given his limited functional mobility, dementia would not consider him a good candidate to undergo work for TAVR. Collaborative discussion with family with decision made to medically manage -- will ask for palliative care consultation, he is already a DNR  Acute HFrEF -- decline in LVEF to 25%, global hypokinesis, suspect 2/2 severe AS. His blood pressures are borderline, therefore do not expect that he could tolerate GDMT. Continue Toprol XL 25mg  daily, spiro 25mg  daily. He remains volume overloaded on exam -- on IV lasix 20mg  daily  New onset atrial fibrillation with RVR -- noted on admission while in the ED, initially given IV Cardizem with conversion to SR -- with reduced LVEF need to avoid Cardizem  -- continue  Toprol 25mg  daily  -- on coumadin PTA  Hx of antiphospholipid antibody syndrome Hx of PE -- on coumadin PTA, INR 3.1 today  Per primary Aspiration PNA Dementia CVA-- on plavix, statin DM  Risk Assessment/Risk Scores:   New York Heart Association (NYHA) Functional Class NYHA Class III  CHA2DS2-VASc Score = 7  This indicates a 11.2% annual risk of stroke. The patient's score is based upon: CHF History: 1 HTN History: 1 Diabetes History: 1 Stroke History: 2 Vascular Disease History: 0 Age Score: 2 Gender Score: 0   For questions or updates, please contact [facility] Please consult [email or web address] for contact info under  Signed, Laverda Page, NP  02/16/2023 2:40 PM As above, patient seen and examined.  Briefly he is a 77 year old male with past medical history of previous CVA, pulmonary embolus with antiphospholipid antibody syndrome, hypertension, diabetes mellitus, hypothyroidism, dementia for evaluation of cardiomyopathy and severe aortic stenosis.  Patient was admitted with probable aspiration pneumonia.  Echocardiogram has been repeated this admission shows ejection fraction 25% which is newly reduced.  There is mild to moderate mitral regurgitation, severe aortic stenosis with mean gradient 38 mmHg, aortic valve area 0.5 cm and dimensionless index 0.13.  Cardiology now asked to evaluate.  At time of my evaluation the patient is alert and oriented to person only.  He thinks he is in [place] and does not know the date.  [facility]  He is not ambulatory.  He does not recall why he was admitted.  He does state that he has some dyspnea.  He denies chest pain or syncope.  Chest x-ray shows bibasilar opacities likely secondary to atelectasis.  Sodium 134, creatinine 1.02, BUN 29, white blood cell count 14.7, hemoglobin 12.2, troponin 681.  Electrocardiogram shows sinus tachycardia, right axis deviation, left ventricular hypertrophy.  Urinalysis  suggestive of UTI.  BNP 684.  1 severe aortic stenosis-patient with newly diagnosed cardiomyopathy and severe aortic stenosis on echocardiogram.  Long discussion with patient's daughter and granddaughter by phone.  [facility] and is not ambulatory.  He also has significant dementia.  I do not think he would be a good candidate for aggressive cardiac intervention including consideration of TAVR.  They are in agreement.  They would like for him to be no CODE BLUE.  Would also likely initiate comfort measures and not return to the hospital in the future.  I would ask palliative care to evaluate the patient.  Will not pursue further cardiac evaluation.  2 cardiomyopathy-likely secondary to severe aortic stenosis.  Blood pressure is borderline and we are not being aggressive.  We will therefore not add ARB/Entresto or SGLT2 inhibitor.  Will continue low-dose Toprol at 25 mg daily and spironolactone 25 mg daily.Marland Kitchen  He is mildly volume overloaded on examination.  Continue low-dose Lasix at 20 mg IV daily and will transition to oral in the next 24 hours.  3 history of antiphospholipid antibody syndrome and pulmonary embolus-he is on Coumadin.  4 paroxysmal atrial fibrillation-patient had transient atrial fibrillation in the emergency room.  He is already on anticoagulation.  Continue beta-blocker.  5 aspiration pneumonia-Per primary care.  Olga Millers, MD

## 2023-02-16 NOTE — Progress Notes (Signed)
   02/16/23 2134  BiPAP/CPAP/SIPAP  $ Non-Invasive Ventilator  Non-Invasive Vent Set Up;Non-Invasive Vent Initial  $ Face Mask Large  Yes  BiPAP/CPAP/SIPAP Pt Type Adult  BiPAP/CPAP/SIPAP V60  Mask Type Full face mask  Mask Size Large  Set Rate 12 breaths/min  Respiratory Rate 40 breaths/min  IPAP 12 cmH20  EPAP 6 cmH2O  FiO2 (%) 50 %  Minute Ventilation 24.1  Leak 0  Peak Inspiratory Pressure (PIP) 16  Tidal Volume (Vt) 595  Auto Titrate No  Press High Alarm 30 cmH2O  Press Low Alarm 3 cmH2O  CPAP/SIPAP surface wiped down Yes   RT called to placed pt on bipap for worsening pulmonary edema secondary to CHF.  Pt is resting comfortably.  RT will continue to monitor

## 2023-02-16 NOTE — Progress Notes (Signed)
PHARMACY - ANTICOAGULATION CONSULT NOTE  Pharmacy Consult for warfarin Indication: hx of stroke, PE, antiphospholipid antibody syndrome  No Known Allergies  Patient Measurements: Height: 5\' 10"  (177.8 cm) Weight: 98.9 kg (218 lb 0.6 oz) IBW/kg (Calculated) : 73  Vital Signs: Temp: 97.6 F (36.4 C) (11/05 0408) Temp Source: Oral (11/05 0408) BP: 106/82 (11/05 0729) Pulse Rate: 81 (11/05 0729)  Labs: Recent Labs    02/14/23 2055 02/14/23 2239 02/15/23 0311 02/15/23 0514 02/15/23 0653 02/16/23 0455  HGB 13.6  --  12.5* 12.3*  --  12.2*  HCT 40.0  --  38.0* 37.7*  --  37.6*  PLT  --   --  226 224  --  211  LABPROT  --   --  26.1* 25.9*  --  32.3*  INR  --   --  2.4* 2.3*  --  3.1*  CREATININE 0.90  --  1.15  --   --  1.02  TROPONINIHS  --  219*  --  584* 681*  --     Estimated Creatinine Clearance: 69.3 mL/min (by C-G formula based on SCr of 1.02 mg/dL).   Medical History: Past Medical History:  Diagnosis Date   Antiphospholipid syndrome (HCC)    Cognitive deficits    CVA (cerebral vascular accident) (HCC) 2020   2020 and 07/2019   Diabetes mellitus (HCC)    Hypertension    Hypothyroidism    Moderate aortic stenosis    Pulmonary embolism (HCC)    Tobacco abuse     Assessment: 89 yoM admitted with AF RVR. Pt on warfarin prior to admit for hx stroke, PE, and antiphospholipid syndrome.  INR therapeutic at 2.3 on admit, today slightly above goal at 3.1 likely due to acute illness and ABX.  Home dose 5mg  daily  Goal of Therapy:  INR 2-3 Monitor platelets by anticoagulation protocol: Yes   Plan:  Warfarin 3mg  PO x1 tonight - reduced dose Daily protime  Fredonia Highland, PharmD, BCPS, [facility] Clinical Pharmacist [phone] Please check AMION for all [facility] Pharmacy numbers 02/16/2023

## 2023-02-16 NOTE — Assessment & Plan Note (Signed)
AS actually pretty bad in last echo 2021, area 0.95 cm^2.  Now area down to 0.5 cm^2 and gradient up. - Consult Cardiology

## 2023-02-16 NOTE — Progress Notes (Signed)
Progress Note   Patient: Roger Ramirez ZOX:[id number] DOB: 07-06-1945 DOA: 02/14/2023     2 DOS: the patient was seen and examined on 02/16/2023 at 10:40 AM      Brief hospital course: 77 y.o. M with dementia, facility-dwelling, PE and APLS on warfarin, hx CVA and AS and MI who presented with dyspnea, hypoxia.  Found to have Afib RVR suspected pneumonia and CHF.     Assessment and Plan: * Aspiration pneumonia (HCC) Lactic acidosis Presented with cough, respiratory distress, multifocal infiltrates, elevated procalcitonin, and leukocytosis.   As outlined yesterday, suspect the lactic acidosis was mainly due to respiratory distress, advanced aortic stenosis, metformin, not sepsis. - Continue Rocephin - Trend procalcitonin - Follow urine and blood cultures     Acute on chronic combined systolic and diastolic CHF (congestive heart failure) (HCC) EF 2021 was normal.  Now down to 25%.  Suspect due to AS. - Continue Cautious IV furosemide -Strict I/Os, daily weights, telemetry  -Daily monitoring renal function and K - Consult Cardiology for prognosis, AS  Severe aortic stenosis AS actually pretty bad in last echo 2021, area 0.95 cm^2.  Now area down to 0.5 cm^2 and gradient up. - Consult Cardiology  New onset atrial fibrillation with RVR (HCC) Transiently in rapid Afib on admission, new diagnosis.  Back in sinus now.  On permanent warfarin for APLS already. Remains rate controlled - Continue metoprolol   Dementia without behavioral disturbance (HCC) At mental baseline it appears - Standard delirium precautions: blinds open and lights on during day, TV off, minimize interruptions at night, glasses/hearing aids, PT/OT, avoiding Beers list medications    Chronic anticoagulation    Resolved ischemic cerebrovascular accident (CVA) involving left anterior cerebral artery territory in remote past - Continue Plavix - Continue Lipitor  Essential hypertension BP soft -  Cautiously continue metoprolol, Lasix, spironolactone  Obesity (BMI 30.0-34.9) BMI 30  Diabetes mellitus type 2, noninsulin dependent (HCC) A1c 6% Glucoses okay - Continue SS corrections - Hold metformin  Antiphospholipid antibody syndrome (HCC) See above  Hypothyroidism TSH normal - Continue LT4  History of pulmonary embolus (PE) - Continue warfarin          Subjective: Still wheezing, but mentation okay/improving, respiratory status improving, off oxygen.  Blood pressure okay echocardiogram shows reduced EF and aortic stenosis now severe.  Cardiology consulted     Physical Exam: BP 102/88 (BP Location: Left Arm)   Pulse 77   Temp 98.1 F (36.7 C) (Oral)   Resp (!) 23   Ht 5\' 10"  (1.778 m)   Wt 98.9 kg   SpO2 100%   BMI 31.28 kg/m   Very elderly adult male, lying in bed, looking around the room, makes eye contact, but mostly inattentive, very weak and tired RRR, systolic murmur noted today, no peripheral edema Respiratory rate improved, he has audible wheezing, from the upper airway, no wheezing auscultated in lung fields, no rales Abdomen soft, no guarding Attention distracted due to dementia, makes eye contact, answers questions sometimes, face symmetric, strength generally weak but symmetric    Data Reviewed: Cardiology were consulted Patient metabolic panel shows stable renal function, LFTs normal Procalcitonin up to 1.2, lactate resolved CBC shows white blood cell count improved to 14, otherwise unremarkable INR 3.1 Echocardiogram reviewed and summarized above Hemoglobin A1c 6%   Family Communication: Daughter and son at the bedside    Disposition: Status is: Inpatient 77 year old male with dementia, lives in facility, admitted with what was initially looking just like  pneumonia, but now looks to be exacerbated by CHF flare and progression to severe aortic stenosis.  I presume the prognosis is very poor for his aortic stenosis and  congestive heart failure, but defer to cardiology.        Author: Alberteen Sam, MD 02/16/2023 1:39 PM  For on call review [email or web address].

## 2023-02-16 NOTE — Plan of Care (Signed)

## 2023-02-16 NOTE — Progress Notes (Signed)
Speech Language Pathology Treatment: Dysphagia  Patient Details Name: Roger Ramirez MRN: [id number] DOB: 1945/11/30 Today's Date: 02/16/2023 Time: 6010-9323 SLP Time Calculation (min) (ACUTE ONLY): 12 min  Assessment / Plan / Recommendation Clinical Impression  Pt seen for skilled SLP intervention to address diet tolerance. Per daughter report, pt is tolerating diet without significant concerns for aspiration and he has had a good appetite. She did report x1 instance of coughing immediately following large, consecutive sips of water that followed a bite of solids. When pt is reminded to eat and drink at a slower pace, no concerns have been noted across x3 meals. Per chart review, pt is on room air and afebrile. WBC is trending down. No recent CXR completed.   Recommend pt continue current diet as tolerated. A mechanical soft consistency is his baseline diet per daughter. No further acute SLP needs identified at this time. SLP will sign off.   Please re-consult if pt exhibits concerns for aspiration with PO intake.    HPI HPI: 77 y.o. M with dementia, facility-dwelling, PE and APLS on warfarin, hx CVA and AS and MI who presented with dyspnea, hypoxia.  Found to have Afib RVR suspected pneumonia and CHF.      SLP Plan  Discharge SLP treatment due to (comment) (no further SLP services indicated)      Recommendations for follow up therapy are one component of a multi-disciplinary discharge planning process, led by the attending physician.  Recommendations may be updated based on patient status, additional functional criteria and insurance authorization.    Recommendations  Diet recommendations: Dysphagia 3 (mechanical soft);Thin liquid (baseline diet per daughter) Liquids provided via: Cup;Straw Medication Administration: Whole meds with liquid Supervision: Patient able to self feed;Trained caregiver to feed patient;Full supervision/cueing for compensatory strategies Compensations:  Minimize environmental distractions;Slow rate;Small sips/bites Postural Changes and/or Swallow Maneuvers: Seated upright 90 degrees                 Oral care BID   Frequent or constant Supervision/Assistance  (r/o dysphagia)     Discharge SLP treatment due to (comment) (no further SLP services indicated)     [name]  02/16/2023, 10:22 AM

## 2023-02-16 NOTE — Progress Notes (Incomplete)
lASIX Cxr bIpAp

## 2023-02-17 DIAGNOSIS — Z7189 Other specified counseling: Secondary | ICD-10-CM | POA: Diagnosis not present

## 2023-02-17 DIAGNOSIS — Z515 Encounter for palliative care: Secondary | ICD-10-CM

## 2023-02-17 DIAGNOSIS — I350 Nonrheumatic aortic (valve) stenosis: Secondary | ICD-10-CM | POA: Diagnosis not present

## 2023-02-17 DIAGNOSIS — J690 Pneumonitis due to inhalation of food and vomit: Secondary | ICD-10-CM | POA: Diagnosis not present

## 2023-02-17 LAB — PROTIME-INR
INR: 3.2 — ABNORMAL HIGH (ref 0.8–1.2)
Prothrombin Time: 33.3 s — ABNORMAL HIGH (ref 11.4–15.2)

## 2023-02-17 LAB — URINE CULTURE: Culture: >=100000 — AB

## 2023-02-17 LAB — COMPREHENSIVE METABOLIC PANEL
ALT: 22 U/L (ref 0–44)
AST: 39 U/L (ref 15–41)
Albumin: 2.9 g/dL — ABNORMAL LOW (ref 3.5–5.0)
Alkaline Phosphatase: 69 U/L (ref 38–126)
Anion gap: 10 (ref 5–15)
BUN: 27 mg/dL — ABNORMAL HIGH (ref 8–23)
CO2: 21 mmol/L — ABNORMAL LOW (ref 22–32)
Calcium: 8.5 mg/dL — ABNORMAL LOW (ref 8.9–10.3)
Chloride: 102 mmol/L (ref 98–111)
Creatinine, Ser: 1.05 mg/dL (ref 0.61–1.24)
GFR, Estimated: >60 mL/min (ref >60)
Glucose, Bld: 141 mg/dL — ABNORMAL HIGH (ref 70–99)
Potassium: 4 mmol/L (ref 3.5–5.1)
Sodium: 133 mmol/L — ABNORMAL LOW (ref 135–145)
Total Bilirubin: 0.8 mg/dL (ref <1.2)
Total Protein: 6.5 g/dL (ref 6.5–8.1)

## 2023-02-17 LAB — BRAIN NATRIURETIC PEPTIDE: B Natriuretic Peptide: 1526 pg/mL — ABNORMAL HIGH (ref 0.0–100.0)

## 2023-02-17 LAB — GLUCOSE, CAPILLARY: Glucose-Capillary: 162 mg/dL — ABNORMAL HIGH (ref 70–99)

## 2023-02-17 LAB — CBC
HCT: 36.9 % — ABNORMAL LOW (ref 39.0–52.0)
Hemoglobin: 12.2 g/dL — ABNORMAL LOW (ref 13.0–17.0)
MCH: 29.2 pg (ref 26.0–34.0)
MCHC: 33.1 g/dL (ref 30.0–36.0)
MCV: 88.3 fL (ref 80.0–100.0)
Platelets: 219 10*3/uL (ref 150–400)
RBC: 4.18 MIL/uL — ABNORMAL LOW (ref 4.22–5.81)
RDW: 15.4 % (ref 11.5–15.5)
WBC: 9.5 10*3/uL (ref 4.0–10.5)
nRBC: 0 % (ref 0.0–0.2)

## 2023-02-17 LAB — CULTURE, BLOOD (ROUTINE X 2): Special Requests: ADEQUATE

## 2023-02-17 MED ORDER — FUROSEMIDE 20 MG PO TABS: 20.0000 mg | ORAL_TABLET | Freq: Every day | ORAL | Status: DC

## 2023-02-17 MED ORDER — GLYCOPYRROLATE 0.2 MG/ML IJ SOLN
0.2000 mg | INTRAMUSCULAR | Status: DC | PRN
Start: 2023-02-17 — End: 2023-02-19

## 2023-02-17 MED ORDER — WARFARIN SODIUM 3 MG PO TABS
3.0000 mg | ORAL_TABLET | Freq: Once | ORAL | Status: DC
Start: 2023-02-17 — End: 2023-02-17

## 2023-02-17 MED ORDER — POLYVINYL ALCOHOL 1.4 % OP SOLN: 1.0000 [drp] | Freq: Four times a day (QID) | OPHTHALMIC | Status: DC | PRN

## 2023-02-17 MED ORDER — HYDROMORPHONE HCL 1 MG/ML IJ SOLN
0.5000 mg | Freq: Four times a day (QID) | INTRAMUSCULAR | Status: DC
  Administered 2023-02-18 (×2): 0.5 mg via INTRAVENOUS
  Filled 2023-02-17 (×3): qty 0.5

## 2023-02-17 MED ORDER — GLYCOPYRROLATE 1 MG PO TABS: 1.0000 mg | ORAL_TABLET | ORAL | Status: DC | PRN

## 2023-02-17 MED ORDER — LORAZEPAM 2 MG/ML IJ SOLN
0.5000 mg | Freq: Four times a day (QID) | INTRAMUSCULAR | Status: DC
Start: 2023-02-17 — End: 2023-02-19
  Administered 2023-02-18 (×2): 0.5 mg via INTRAVENOUS
  Filled 2023-02-17 (×3): qty 1

## 2023-02-17 MED ORDER — BIOTENE DRY MOUTH MT LIQD: 15.0000 mL | OROMUCOSAL | Status: DC | PRN

## 2023-02-17 MED ORDER — SENNOSIDES-DOCUSATE SODIUM 8.6-50 MG PO TABS: 1.0000 | ORAL_TABLET | Freq: Every evening | ORAL | Status: DC | PRN

## 2023-02-17 MED ORDER — HYDRALAZINE HCL 20 MG/ML IJ SOLN: 10.0000 mg | INTRAMUSCULAR | Status: DC | PRN

## 2023-02-17 MED ORDER — LORAZEPAM 2 MG/ML IJ SOLN
0.5000 mg | INTRAMUSCULAR | Status: DC | PRN
  Administered 2023-02-18: 0.5 mg via INTRAVENOUS

## 2023-02-17 MED ORDER — TRAZODONE HCL 50 MG PO TABS: 50.0000 mg | ORAL_TABLET | Freq: Every evening | ORAL | Status: DC | PRN

## 2023-02-17 MED ORDER — SODIUM CHLORIDE 0.9 % IV SOLN
3.0000 g | Freq: Four times a day (QID) | INTRAVENOUS | Status: DC
  Administered 2023-02-17: 3 g via INTRAVENOUS
  Filled 2023-02-17: qty 8

## 2023-02-17 MED ORDER — HYDROMORPHONE HCL 1 MG/ML IJ SOLN: 0.5000 mg | INTRAMUSCULAR | Status: DC | PRN

## 2023-02-17 NOTE — TOC Progression Note (Signed)
Transition of Care [facility]) - Progression Note    Patient Details  Name: Roger Ramirez MRN: [id number] Date of Birth: 08-Jan-1946  [facility]) CM/SW Contact  Deatra Robinson, [place] Phone Number: 02/17/2023, 2:26 PM  Clinical Narrative: Per Palliative Medicine APP, pt's family agreeable to hospice home placement and requesting [facility]. Referral made to [facility] with [facility]. SW will assist as indicated.   Dellie Burns, MSW, LCSW [phone] (coverage)        Expected Discharge Plan: Hospice Medical Facility Barriers to Discharge: Hospice Bed not available  Expected Discharge Plan and Services                                               Social Determinants of Health (SDOH) Interventions SDOH Screenings   Depression (PHQ2-9): Medium Risk (10/12/2019)  Tobacco Use: Medium Risk (02/14/2023)    Readmission Risk Interventions     No data to display

## 2023-02-17 NOTE — Consult Note (Signed)
Palliative Medicine Inpatient Consult Note  Consulting Provider:  Arty Baumgartner, NP    Reason for consult:   Palliative Care Consult Services Palliative Medicine Consult  Reason for Consult? severe AS, EF 25%   02/17/2023  HPI:  Per intake H&P --> 77 y.o. M with dementia, facility-dwelling, PE and APLS on warfarin, hx CVA and AS and MI who presented with dyspnea, hypoxia. Found to have Afib RVR suspected pneumonia and CHF.   The Palliative care team has been asked to get involved to assist with goals of care conversations.   Clinical Assessment/Goals of Care:  *Please note that this is a verbal dictation therefore any spelling or grammatical errors are due to the "Dragon Medical One" system interpretation.  I have reviewed medical records including EPIC notes, labs and imaging, received report from bedside RN, assessed the patient who is lying in bed slightly tachypneic.    I met with patients daughter, Cristela Blue, granddaughter, on [place] on [address], and DIL Candy on Speakerphone  to further discuss diagnosis prognosis, GOC, EOL wishes, disposition and options.   I introduced Palliative Medicine as specialized medical care for people living with serious illness. It focuses on providing relief from the symptoms and stress of a serious illness. The goal is to improve quality of life for both the patient and the family.  Medical History Review and Understanding:  We reviewed Eliceo's past medical history significant for pulmonary embolism, stroke, aortic stenosis, heart failure, and coronary artery disease.  Reviewed patient's present hospitalization in the setting of pneumonia and volume overload.  Social History:  Traye is from [facility].  He is divorced.  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  Miklo has 2 children.  He is identified as sarcastic and tender.  He used to love fishing going in the pool playing cards.  Agustine has  a love of animals and was formerly living on [place]. Shaheer is a man of faith.  Functional and Nutritional State:  Trey does lived [place] for the past 3 years.  Mayco is reliant for all bADLs with the exception of being able to feed himself.  Advance Directives:  A detailed discussion was had today regarding advanced directives.  Tauheed does not have Ads on file though his two children are his surrogate decision makers.   Code Status:  Concepts specific to code status, artifical feeding and hydration, continued IV antibiotics and rehospitalization was had.  The difference between a aggressive medical intervention path  and a palliative comfort care path for this patient at this time was had.   Linkon is an established DO NOT RESUSCITATE DO NOT INTUBATE CODE STATUS  Discussion:  Shlomie his family and I discussed his decline over time.  His depreciating health state started in 2016 with a saddle pulmonary embolism.  He then suffered a major stroke in 2020 followed by another stroke in 2021 which left him dependent and when he transition to collapse.  Patient's family understand that he has significant aortic stenosis and heart failure.  They realize that he will continue to go through the cycle of illness without a clear end in sight.  Discussed the options of continuing present care versus transitioning her focus to comfort, We talked about transition to comfort measures in house and what that would entail inclusive of medications to control pain, dyspnea, agitation, nausea, itching, and hiccups.   We discussed stopping all uneccessary measures such as cardiac monitoring, blood draws, needle  sticks, and frequent vital signs.   Patient's family would like for [place] to focus on comfort.  We determined that his dyspnea would best be addressed by adding medications around-the-clock such as Dilaudid and Ativan.  Patient's family are aware that this may lead to more  somnolence.  Patient's family would like patient to transition to [facility] once a bed is available I shared that I would reach out to the liaison.  Discussed the importance of continued conversation with family and their  medical providers regarding overall plan of care and treatment options, ensuring decisions are within the context of the patients values and GOCs.  Decision Maker: Rozanna Boer (Daughter): [phone] (Work Phone)   SUMMARY OF RECOMMENDATIONS   DNAR/DNI  Comfort care  Scheduled low-dose Dilaudid and Ativan every 6 hours around-the-clock  Additional comfort medications per [facility]  Unrestricted visitation  Appreciate transitions of care referral to [employer]  Ongoing palliative care support  Code Status/Advance Care Planning: DNAR/DNI  Palliative Prophylaxis:  Aspiration, Bowel Regimen, Delirium Protocol, Frequent Pain Assessment, Oral Care, Palliative Wound Care, and Turn Reposition  Additional Recommendations (Limitations, Scope, Preferences): Comfort care  Psycho-social/Spiritual:  Desire for further Chaplaincy support: Yes patient is open to spiritual support Additional Recommendations: Education on end-of-life symptom management   Prognosis: Days to weeks at the most.  Discharge Planning: Discharge to inpatient hospice once accepted Vitals:   02/17/23 0320 02/17/23 0510  BP: 105/75 100/71  Pulse: 98 89  Resp: (!) 27 (!) 24  Temp:  99.5 F (37.5 C)  SpO2: 97% 98%    Intake/Output Summary (Last 24 hours) at 02/17/2023 0737 Last data filed at 02/16/2023 2200 Gross per 24 hour  Intake 480 ml  Output 700 ml  Net -220 ml   Last Weight  Most recent update: 02/17/2023  5:21 AM    Weight  99.5 kg (219 lb 5.7 oz)            Gen: Frail elderly Caucasian male tachypneic HEENT: moist mucous membranes CV: Regular rate and irregular rhythm  PULM: On 2 L nasal cannula breathing is tachypneic ABD: soft/nontender EXT:  No edema Neuro: Oriented to self  PPS: 10-20%   This conversation/these recommendations were discussed with patient primary care team, Dr. Nelson Chimes  Billing based on MDM: High  Problems Addressed: One acute or chronic illness or injury that poses a threat to life or bodily function  Amount and/or Complexity of Data: Category 3:Discussion of management or test interpretation with external physician/other qualified health care professional/appropriate source (not separately reported)  Risks: Decision regarding hospitalization or escalation of hospital care and Decision not to resuscitate or to de-escalate care because of poor prognosis ______________________________________________________ [name] Palliative Medicine Team Team Cell Phone: [phone] Please utilize secure chat with additional questions, if there is no response within 30 minutes please call the above phone number  Palliative Medicine Team providers are available by phone from 7am to 7pm daily and can be reached through the team cell phone.  Should this patient require assistance outside of these hours, please call the patient's attending physician.

## 2023-02-17 NOTE — Progress Notes (Signed)
PHARMACY - ANTICOAGULATION CONSULT NOTE  Pharmacy Consult for warfarin Indication: hx of stroke, PE, antiphospholipid antibody syndrome  No Known Allergies  Patient Measurements: Height: 5\' 10"  (177.8 cm) Weight: 99.5 kg (219 lb 5.7 oz) IBW/kg (Calculated) : 73  Vital Signs: Temp: 99.6 F (37.6 C) (11/06 0802) Temp Source: Axillary (11/06 0802) BP: 100/71 (11/06 0510) Pulse Rate: 89 (11/06 0510)  Labs: Recent Labs    02/14/23 2055 02/14/23 2239 02/15/23 0311 02/15/23 0514 02/15/23 0653 02/16/23 0455 02/17/23 0831  HGB 13.6  --  12.5* 12.3*  --  12.2* 12.2*  HCT 40.0  --  38.0* 37.7*  --  37.6* 36.9*  PLT  --   --  226 224  --  211 219  LABPROT  --   --  26.1* 25.9*  --  32.3*  --   INR  --   --  2.4* 2.3*  --  3.1*  --   CREATININE 0.90  --  1.15  --   --  1.02  --   TROPONINIHS  --  219*  --  584* 681*  --   --     Estimated Creatinine Clearance: 69.4 mL/min (by C-G formula based on SCr of 1.02 mg/dL).   Medical History: Past Medical History:  Diagnosis Date   Antiphospholipid syndrome (HCC)    Cognitive deficits    CVA (cerebral vascular accident) (HCC) 2020   2020 and 07/2019   Diabetes mellitus (HCC)    Hypertension    Hypothyroidism    Moderate aortic stenosis    Pulmonary embolism (HCC)    Tobacco abuse     Assessment: 41 yoM admitted with AF RVR. Pt on warfarin prior to admit for hx stroke, PE, and antiphospholipid syndrome.  INR today is mildly elevated again at 3.2. Reduced dose given yesterday, will reduce dose again this evening. CBC stable.  Home dose 5mg  daily  Goal of Therapy:  INR 2-3 Monitor platelets by anticoagulation protocol: Yes   Plan:  Warfarin 3mg  PO x1 again tonight - reduced dose Daily protime  Fredonia Highland, PharmD, BCPS, [facility] Clinical Pharmacist [phone] Please check AMION for all [facility] numbers 02/17/2023

## 2023-02-17 NOTE — Progress Notes (Signed)
This chaplain responded to PMT NP-Michele consult for spiritual care in the setting of Pt. EOL and prayer.  The Pt. is awake with the Pt. daughter-[name] and son in law at the bedside.   Roger Ramirez is very helpful in the space of navigating communication with the Pt. The chaplain observed the Pt. frequently looking back to Roger Ramirez for connection in the story telling. The chaplain received a thumbs up when a revisit was planned to talk about his great grand child.  [name] stepped outside the room with the chaplain. The chaplain listened reflectively as Roger Ramirez tearfully explored her own thoughts about the Pt. understanding of this admission and the chaplain visit. [name] and the chaplain agreed to continued visits with the chaplain's acceptance of stepping away if distress is observed or communicated from the Pt. The chaplain left the possibility of the Pt. connecting better with the chaplain without the family's presence open for discernment from the family.  The chaplain offered F/U spiritual care for the family on this journey. A shared hug with [name] was the answer in the moment.  This chaplain is available for F/U spiritual care as needed.  Chaplain Stephanie Acre [phone]

## 2023-02-17 NOTE — Progress Notes (Signed)
PROGRESS NOTE    Roger Ramirez  WUJ:[id number] DOB: Feb 28, 1946 DOA: 02/14/2023 PCP: Renford Dills, MD    . Brief hospital course: 77 y.o. M with dementia, facility-dwelling, PE and APLS on warfarin, hx CVA and AS and MI who presented with dyspnea, hypoxia.  Found to have Afib RVR suspected pneumonia and CHF.  Patient was noted to have EF of 25% with severe aortic stenosis.  Cardiology team was consulted who recommends medical management as patient is not a good candidate for aggressive intervention.  Palliative care team consulted.  Patient has not been transitioned to comfort care.         Assessment and Plan: * Aspiration pneumonia (HCC) Lactic acidosis Acute on chronic combined CHF, EF 25%, new diagnosis Severe aortic stenosis Atrial fibrillation with RVR Advanced dementia History of CVA DM2 Antiphospholipid syndrome Patient is now comfort care.  Evaluated by care team.  Inpatient hospice team has been contacted.  Subjective:  No complaints doing okay.   Physical Exam: Constitutional: Not in acute distress, elderly frail.  Appears chronically ill Respiratory: Clear to auscultation bilaterally Cardiovascular: Normal sinus rhythm, no rubs Abdomen: Nontender nondistended good bowel sounds Musculoskeletal: No edema noted Skin: No rashes seen Neurologic: CN 2-12 grossly intact.  And nonfocal Psychiatric: Normal judgment and insight. Alert and oriented x 3. Normal mood.        Diet Orders (From admission, onward)     Start     Ordered   02/15/23 1205  DIET DYS 3 Room service appropriate? Yes; Fluid consistency: Thin  Diet effective now       Question Answer Comment  Room service appropriate? Yes   Fluid consistency: Thin      02/15/23 1204            Objective: Vitals:   02/17/23 0510 02/17/23 0520 02/17/23 0802 02/17/23 0957  BP: 100/71   101/72  Pulse: 89     Resp: (!) 24  20   Temp: 99.5 F (37.5 C)  99.6 F (37.6 C)   TempSrc: Axillary   Axillary   SpO2: 98%     Weight:  99.5 kg    Height:        Intake/Output Summary (Last 24 hours) at 02/17/2023 1244 Last data filed at 02/17/2023 0807 Gross per 24 hour  Intake 480 ml  Output 1200 ml  Net -720 ml   Filed Weights   02/15/23 1508 02/16/23 0041 02/17/23 0520  Weight: 95.7 kg 98.9 kg 99.5 kg    Scheduled Meds:   HYDROmorphone (DILAUDID) injection  0.5 mg Intravenous Q6H   LORazepam  0.5 mg Intravenous Q6H   Continuous Infusions:  Nutritional status     Body mass index is 31.47 kg/m.  Data Reviewed:   CBC: Recent Labs  Lab 02/14/23 2039 02/14/23 2052 02/14/23 2055 02/15/23 0311 02/15/23 0514 02/16/23 0455 02/17/23 0831  WBC 7.8  --   --  16.2* 15.3* 14.7* 9.5  NEUTROABS 7.2  --   --   --   --   --   --   HGB 13.0   < > 13.6 12.5* 12.3* 12.2* 12.2*  HCT 40.2   < > 40.0 38.0* 37.7* 37.6* 36.9*  MCV 89.3  --   --  89.4 88.3 89.7 88.3  PLT 221  --   --  226 224 211 219   < > = values in this interval not displayed.   Basic Metabolic Panel: Recent Labs  Lab 02/14/23 2039 02/14/23 2052  02/14/23 2055 02/15/23 0311 02/16/23 0455 02/17/23 0831  NA 137 138 137 136 134* 133*  K 3.5 3.5 3.4* 3.9 4.3 4.0  CL 101  --  103 104 104 102  CO2 23  --   --  19* 21* 21*  GLUCOSE 206*  --  211* 198* 135* 141*  BUN 17  --  20 19 29* 27*  CREATININE 1.15  --  0.90 1.15 1.02 1.05  CALCIUM 9.3  --   --  9.0 8.6* 8.5*   GFR: Estimated Creatinine Clearance: 67.5 mL/min (by C-G formula based on SCr of 1.05 mg/dL). Liver Function Tests: Recent Labs  Lab 02/14/23 2039 02/16/23 0455 02/17/23 0831  AST 35 29 39  ALT 20 18 22   ALKPHOS 78 67 69  BILITOT 1.0 0.7 0.8  PROT 7.1 6.4* 6.5  ALBUMIN 3.3* 3.0* 2.9*   No results for input(s): "LIPASE", "AMYLASE" in the last 168 hours. No results for input(s): "AMMONIA" in the last 168 hours. Coagulation Profile: Recent Labs  Lab 02/14/23 2039 02/15/23 0311 02/15/23 0514 02/16/23 0455 02/17/23 0831  INR 2.3*  2.4* 2.3* 3.1* 3.2*   Cardiac Enzymes: No results for input(s): "CKTOTAL", "CKMB", "CKMBINDEX", "TROPONINI" in the last 168 hours. BNP (last 3 results) No results for input(s): "PROBNP" in the last 8760 hours. HbA1C: Recent Labs    02/16/23 0455  HGBA1C 6.0*   CBG: Recent Labs  Lab 02/16/23 0614 02/16/23 1101 02/16/23 1614 02/16/23 1949 02/17/23 0525  GLUCAP 113* 137* 148* 198* 162*   Lipid Profile: No results for input(s): "CHOL", "HDL", "LDLCALC", "TRIG", "CHOLHDL", "LDLDIRECT" in the last 72 hours. Thyroid Function Tests: Recent Labs    02/15/23 0311  TSH 1.034   Anemia Panel: No results for input(s): "VITAMINB12", "FOLATE", "FERRITIN", "TIBC", "IRON", "RETICCTPCT" in the last 72 hours. Sepsis Labs: Recent Labs  Lab 02/15/23 0004 02/15/23 0520 02/15/23 8295 02/15/23 0805 02/16/23 0455 02/16/23 0844  PROCALCITON  --   --  1.11  --  1.24  --   LATICACIDVEN 6.0* 4.3*  --  3.9*  --  1.8    Recent Results (from the past 240 hour(s))  Resp panel by RT-PCR (RSV, Flu A&B, Covid) Anterior Nasal Swab     Status: None   Collection Time: 02/14/23  9:07 PM   Specimen: Anterior Nasal Swab  Result Value Ref Range Status   SARS Coronavirus 2 by RT PCR NEGATIVE NEGATIVE Final   Influenza A by PCR NEGATIVE NEGATIVE Final   Influenza B by PCR NEGATIVE NEGATIVE Final    Comment: (NOTE) The Xpert Xpress SARS-CoV-2/FLU/RSV plus assay is intended as an aid in the diagnosis of influenza from Nasopharyngeal swab specimens and should not be used as a sole basis for treatment. Nasal washings and aspirates are unacceptable for Xpert Xpress SARS-CoV-2/FLU/RSV testing.  Fact Sheet for Patients: BloggerCourse.com  Fact Sheet for Healthcare Providers: SeriousBroker.it  This test is not yet approved or cleared by the [place] FDA and has been authorized for detection and/or diagnosis of SARS-CoV-2 by FDA under an Emergency  Use Authorization (EUA). This EUA will remain in effect (meaning this test can be used) for the duration of the COVID-19 declaration under Section 564(b)(1) of the Act, 21 U.S.C. section 360bbb-3(b)(1), unless the authorization is terminated or revoked.     Resp Syncytial Virus by PCR NEGATIVE NEGATIVE Final    Comment: (NOTE) Fact Sheet for Patients: BloggerCourse.com  Fact Sheet for Healthcare Providers: SeriousBroker.it  This test is not yet approved  or cleared by the [place] and has been authorized for detection and/or diagnosis of SARS-CoV-2 by FDA under an Emergency Use Authorization (EUA). This EUA will remain in effect (meaning this test can be used) for the duration of the COVID-19 declaration under Section 564(b)(1) of the Act, 21 U.S.C. section 360bbb-3(b)(1), unless the authorization is terminated or revoked.  Performed at [facility], [address] [place], [place] [address]   Blood culture (routine x 2)     Status: Abnormal   Collection Time: 02/14/23  9:50 PM   Specimen: BLOOD  Result Value Ref Range Status   Specimen Description BLOOD RIGHT ANTECUBITAL  Final   Special Requests   Final    BOTTLES DRAWN AEROBIC AND ANAEROBIC Blood Culture adequate volume   Culture  Setup Time   Final    GRAM POSITIVE COCCI BOTTLES DRAWN AEROBIC ONLY CRITICAL RESULT CALLED TO, READ BACK BY AND VERIFIED WITH: PHARMD J. MILLEN [id number] @ 1840 FH    Culture (A)  Final    STAPHYLOCOCCUS HOMINIS THE SIGNIFICANCE OF ISOLATING THIS ORGANISM FROM A SINGLE SET OF BLOOD CULTURES WHEN MULTIPLE SETS ARE DRAWN IS UNCERTAIN. PLEASE NOTIFY THE MICROBIOLOGY DEPARTMENT WITHIN ONE WEEK IF SPECIATION AND SENSITIVITIES ARE REQUIRED. Performed at [facility], [address], [place], [place] [address]    Report Status 02/17/2023 FINAL  Final  Blood Culture ID Panel (Reflexed)     Status: Abnormal   Collection Time: 02/14/23   9:50 PM  Result Value Ref Range Status   Enterococcus faecalis NOT DETECTED NOT DETECTED Final   Enterococcus Faecium NOT DETECTED NOT DETECTED Final   Listeria monocytogenes NOT DETECTED NOT DETECTED Final   Staphylococcus species DETECTED (A) NOT DETECTED Final    Comment: CRITICAL RESULT CALLED TO, READ BACK BY AND VERIFIED WITH: PHARMD J. MILLEN [id number] @ 1840 FH    Staphylococcus aureus (BCID) NOT DETECTED NOT DETECTED Final   Staphylococcus epidermidis NOT DETECTED NOT DETECTED Final   Staphylococcus lugdunensis NOT DETECTED NOT DETECTED Final   Streptococcus species NOT DETECTED NOT DETECTED Final   Streptococcus agalactiae NOT DETECTED NOT DETECTED Final   Streptococcus pneumoniae NOT DETECTED NOT DETECTED Final   Streptococcus pyogenes NOT DETECTED NOT DETECTED Final   A.calcoaceticus-baumannii NOT DETECTED NOT DETECTED Final   Bacteroides fragilis NOT DETECTED NOT DETECTED Final   Enterobacterales NOT DETECTED NOT DETECTED Final   Enterobacter cloacae complex NOT DETECTED NOT DETECTED Final   Escherichia coli NOT DETECTED NOT DETECTED Final   Klebsiella aerogenes NOT DETECTED NOT DETECTED Final   Klebsiella oxytoca NOT DETECTED NOT DETECTED Final   Klebsiella pneumoniae NOT DETECTED NOT DETECTED Final   Proteus species NOT DETECTED NOT DETECTED Final   Salmonella species NOT DETECTED NOT DETECTED Final   Serratia marcescens NOT DETECTED NOT DETECTED Final   Haemophilus influenzae NOT DETECTED NOT DETECTED Final   Neisseria meningitidis NOT DETECTED NOT DETECTED Final   Pseudomonas aeruginosa NOT DETECTED NOT DETECTED Final   Stenotrophomonas maltophilia NOT DETECTED NOT DETECTED Final   Candida albicans NOT DETECTED NOT DETECTED Final   Candida auris NOT DETECTED NOT DETECTED Final   Candida glabrata NOT DETECTED NOT DETECTED Final   Candida krusei NOT DETECTED NOT DETECTED Final   Candida parapsilosis NOT DETECTED NOT DETECTED Final   Candida tropicalis NOT DETECTED NOT  DETECTED Final   Cryptococcus neoformans/gattii NOT DETECTED NOT DETECTED Final    Comment: Performed at [facility], [address], [place],  [place] [address]  Blood culture (routine x 2)     Status: None (Preliminary result)   Collection Time: 02/14/23  9:52 PM   Specimen: BLOOD LEFT HAND  Result Value Ref Range Status   Specimen Description BLOOD LEFT HAND  Final   Special Requests   Final    BOTTLES DRAWN AEROBIC AND ANAEROBIC Blood Culture adequate volume   Culture   Final    NO GROWTH 3 DAYS Performed at [facility], [address], [place], [place] [address]    Report Status PENDING  Incomplete  Urine Culture     Status: Abnormal   Collection Time: 02/14/23 10:44 PM   Specimen: Urine, Random  Result Value Ref Range Status   Specimen Description URINE, RANDOM  Final   Special Requests   Final    NONE Reflexed from [id number] Performed at [facility], [address], [place], [place] [address]    Culture >=100,000 COLONIES/mL ESCHERICHIA COLI (A)  Final   Report Status 02/17/2023 FINAL  Final   Organism ID, Bacteria ESCHERICHIA COLI (A)  Final      Susceptibility   Escherichia coli - MIC*    AMPICILLIN >=32 RESISTANT Resistant     CEFAZOLIN 8 SENSITIVE Sensitive     CEFEPIME <=0.12 SENSITIVE Sensitive     CEFTRIAXONE <=0.25 SENSITIVE Sensitive     CIPROFLOXACIN >=4 RESISTANT Resistant     GENTAMICIN <=1 SENSITIVE Sensitive     IMIPENEM <=0.25 SENSITIVE Sensitive     NITROFURANTOIN <=16 SENSITIVE Sensitive     TRIMETH/SULFA >=320 RESISTANT Resistant     AMPICILLIN/SULBACTAM 16 INTERMEDIATE Intermediate     PIP/TAZO <=4 SENSITIVE Sensitive ug/mL    * >=100,000 COLONIES/mL ESCHERICHIA COLI         Radiology Studies: DG CHEST PORT 1 VIEW  Result Date: 02/17/2023 CLINICAL DATA:  Shortness of breath EXAM: PORTABLE CHEST 1 VIEW COMPARISON:  02/14/2023 FINDINGS: Heart and mediastinal contours within normal limits. Perihilar and lower lobe airspace  opacities, worsening since prior study. Diffuse interstitial prominence. Suspect small bilateral effusions. No acute bony abnormality. IMPRESSION: Perihilar and lower lobe airspace opacities with diffuse interstitial prominence. Findings could reflect edema or infection. Findings are worsening since prior study. Suspect small layering effusions. Electronically Signed   By: Charlett Nose M.D.   On: 02/17/2023 00:27           LOS: 3 days   Time spent= 35 mins    Miguel Rota, MD [facility]  If 7PM-7AM, please contact night-coverage  02/17/2023, 12:44 PM

## 2023-02-17 NOTE — Progress Notes (Signed)
Pharmacy Antibiotic Note  Roger Ramirez is a 77 y.o. male admitted on 02/14/2023 with pneumonia. Pharmacy has been consulted for Unasyn dosing for aspiration pneumonia. Pt had received CTX for three days. CrCl ~70 ml/min. UCx with E coli which has been treated x3 days with ceftriaxone.  Plan: Unasyn 3g IV q6h Pharmacy will sign off, reconsult as needed  Height: 5\' 10"  (177.8 cm) Weight: 99.5 kg (219 lb 5.7 oz) IBW/kg (Calculated) : 73  Temp (24hrs), Avg:99 F (37.2 C), Min:98.1 F (36.7 C), Max:99.6 F (37.6 C)  Recent Labs  Lab 02/14/23 2039 02/14/23 2055 02/14/23 2158 02/15/23 0004 02/15/23 0311 02/15/23 0514 02/15/23 0520 02/15/23 0805 02/16/23 0455 02/16/23 0844 02/17/23 0831  WBC 7.8  --   --   --  16.2* 15.3*  --   --  14.7*  --  9.5  CREATININE 1.15 0.90  --   --  1.15  --   --   --  1.02  --   --   LATICACIDVEN  --   --  5.0* 6.0*  --   --  4.3* 3.9*  --  1.8  --     Estimated Creatinine Clearance: 69.4 mL/min (by C-G formula based on SCr of 1.02 mg/dL).    No Known Allergies  Fredonia Highland, PharmD, BCPS, [facility] Clinical Pharmacist [phone] Please check AMION for all [facility] Pharmacy numbers 02/17/2023

## 2023-02-17 NOTE — Care Management Important Message (Signed)
Important Message  Patient Details  Name: Roger Ramirez MRN: [id number] Date of Birth: April 27, 1945   Important Message Given:  Yes - Medicare IM     Dorena Bodo 02/17/2023, 3:18 PM

## 2023-02-17 NOTE — Progress Notes (Signed)
Rounding Note    Patient Name: Roger Ramirez Date of Encounter: 02/17/2023  [facility] Cardiologist: Peter Swaziland, MD   Subjective   No CP or dyspnea; confused  Inpatient Medications    Scheduled Meds:  atorvastatin  40 mg Oral Daily   clopidogrel  75 mg Oral Daily   furosemide  20 mg Intravenous Daily   insulin aspart  0-5 Units Subcutaneous QHS   insulin aspart  0-9 Units Subcutaneous TID WC   levothyroxine  75 mcg Oral Daily   melatonin  3 mg Oral QHS   metoprolol succinate  25 mg Oral Daily   spironolactone  25 mg Oral Daily   warfarin  3 mg Oral ONCE-1600   Warfarin - Pharmacist Dosing Inpatient   Does not apply q1600   Continuous Infusions:  ampicillin-sulbactam (UNASYN) IV 3 g (02/17/23 1020)   PRN Meds: acetaminophen, albuterol, hydrALAZINE, LORazepam, metoprolol tartrate, ondansetron (ZOFRAN) IV, senna-docusate, traZODone   Vital Signs    Vitals:   02/17/23 0510 02/17/23 0520 02/17/23 0802 02/17/23 0957  BP: 100/71   101/72  Pulse: 89     Resp: (!) 24  20   Temp: 99.5 F (37.5 C)  99.6 F (37.6 C)   TempSrc: Axillary  Axillary   SpO2: 98%     Weight:  99.5 kg    Height:        Intake/Output Summary (Last 24 hours) at 02/17/2023 1026 Last data filed at 02/17/2023 0807 Gross per 24 hour  Intake 480 ml  Output 1200 ml  Net -720 ml      02/17/2023    5:20 AM 02/16/2023   12:41 AM 02/15/2023    3:08 PM  Last 3 Weights  Weight (lbs) 219 lb 5.7 oz 218 lb 0.6 oz 210 lb 15.7 oz  Weight (kg) 99.5 kg 98.9 kg 95.7 kg      Telemetry     Sinus- Personally Reviewed   Physical Exam   GEN: No acute distress.   Neck: No JVD Cardiac: RRR, no murmurs, rubs, or gallops.  Respiratory: Clear to auscultation bilaterally. GI: Soft, nontender, non-distended  MS: No edema Neuro:  Nonfocal  Psych: Confused  Labs    High Sensitivity Troponin:   Recent Labs  Lab 02/14/23 2039 02/14/23 2239 02/15/23 0514 02/15/23 0653  TROPONINIHS  265* 219* 584* 681*     Chemistry Recent Labs  Lab 02/14/23 2039 02/14/23 2052 02/15/23 0311 02/16/23 0455 02/17/23 0831  NA 137   < > 136 134* 133*  K 3.5   < > 3.9 4.3 4.0  CL 101   < > 104 104 102  CO2 23  --  19* 21* 21*  GLUCOSE 206*   < > 198* 135* 141*  BUN 17   < > 19 29* 27*  CREATININE 1.15   < > 1.15 1.02 1.05  CALCIUM 9.3  --  9.0 8.6* 8.5*  PROT 7.1  --   --  6.4* 6.5  ALBUMIN 3.3*  --   --  3.0* 2.9*  AST 35  --   --  29 39  ALT 20  --   --  18 22  ALKPHOS 78  --   --  67 69  BILITOT 1.0  --   --  0.7 0.8  GFRNONAA >60  --  >60 >60 >60  ANIONGAP 13  --  13 9 10    < > = values in this interval not displayed.  Hematology Recent Labs  Lab 02/15/23 0514 02/16/23 0455 02/17/23 0831  WBC 15.3* 14.7* 9.5  RBC 4.27 4.19* 4.18*  HGB 12.3* 12.2* 12.2*  HCT 37.7* 37.6* 36.9*  MCV 88.3 89.7 88.3  MCH 28.8 29.1 29.2  MCHC 32.6 32.4 33.1  RDW 15.5 15.7* 15.4  PLT 224 211 219   Thyroid  Recent Labs  Lab 02/15/23 0311  TSH 1.034    BNP Recent Labs  Lab 02/14/23 2041 02/17/23 0831  BNP 684.2* 1,526.0*     Radiology    DG CHEST PORT 1 VIEW  Result Date: 02/17/2023 CLINICAL DATA:  Shortness of breath EXAM: PORTABLE CHEST 1 VIEW COMPARISON:  02/14/2023 FINDINGS: Heart and mediastinal contours within normal limits. Perihilar and lower lobe airspace opacities, worsening since prior study. Diffuse interstitial prominence. Suspect small bilateral effusions. No acute bony abnormality. IMPRESSION: Perihilar and lower lobe airspace opacities with diffuse interstitial prominence. Findings could reflect edema or infection. Findings are worsening since prior study. Suspect small layering effusions. Electronically Signed   By: Charlett Nose M.D.   On: 02/17/2023 00:27   ECHOCARDIOGRAM COMPLETE  Result Date: 02/15/2023    ECHOCARDIOGRAM REPORT   Patient Name:   Roger Ramirez Date of Exam: 02/15/2023 Medical Rec #:  [id number]             Height:       69.0 in  Accession #:    [id number]            Weight:       207.0 lb Date of Birth:  1946-02-23             BSA:          2.096 m Patient Age:    77 years              BP:           118/98 mmHg Patient Gender: M                     HR:           68 bpm. Exam Location:  Inpatient Procedure: 2D Echo, Cardiac Doppler and Color Doppler Indications:    A fib  History:        Patient has prior history of Echocardiogram examinations, most                 recent 09/04/2019. Previous Myocardial Infarction, Aortic Valve                 Disease, Arrythmias:Atrial Fibrillation; Risk Factors:Diabetes                 and Hypertension.  Sonographer:    [name] Morford RDCS (AE, PE) Referring Phys: [id number] CLAUDIA CLAIBORNE IMPRESSIONS  1. Left ventricular ejection fraction, by estimation, is 25%. The left ventricle has normal function. The left ventricle demonstrates global hypokinesis. Left ventricular diastolic function could not be evaluated.  2. Right ventricular systolic function is normal. The right ventricular size is normal.  3. The mitral valve is degenerative. Mild to moderate mitral valve regurgitation. No evidence of mitral stenosis.  4. The aortic valve is tricuspid. There is moderate calcification of the aortic valve. Aortic valve regurgitation is not visualized. Severe aortic valve stenosis. Aortic valve mean gradient measures 38.0 mmHg. Aortic valve Vmax measures 3.72 m/s. Conclusion(s)/Recommendation(s): Limited images due to poor sound wave transmission. EF is severely reduced and there is severe AS (gradient likely underestimated due to severely  reduced EF). FINDINGS  Left Ventricle: Left ventricular ejection fraction, by estimation, is 25%. The left ventricle has normal function. The left ventricle demonstrates global hypokinesis. The left ventricular internal cavity size was normal in size. There is no left ventricular hypertrophy. Left ventricular diastolic function could not be evaluated due to atrial fibrillation. Left  ventricular diastolic function could not be evaluated. Right Ventricle: The right ventricular size is normal. No increase in right ventricular wall thickness. Right ventricular systolic function is normal. Left Atrium: Left atrial size was normal in size. Right Atrium: Right atrial size was normal in size. Pericardium: There is no evidence of pericardial effusion. Mitral Valve: The mitral valve is degenerative in appearance. Mild to moderate mitral valve regurgitation. No evidence of mitral valve stenosis. Tricuspid Valve: The tricuspid valve is normal in structure. Tricuspid valve regurgitation is trivial. No evidence of tricuspid stenosis. Aortic Valve: The aortic valve is tricuspid. There is moderate calcification of the aortic valve. Aortic valve regurgitation is not visualized. Severe aortic stenosis is present. Aortic valve mean gradient measures 38.0 mmHg. Aortic valve peak gradient measures 55.4 mmHg. Aortic valve area, by VTI measures 0.50 cm. Pulmonic Valve: The pulmonic valve was not well visualized. Pulmonic valve regurgitation is not visualized. No evidence of pulmonic stenosis. Aorta: The aortic root is normal in size and structure. IAS/Shunts: No atrial level shunt detected by color flow Doppler.  LEFT VENTRICLE PLAX 2D LVIDd:         6.05 cm   Diastology LVIDs:         5.10 cm   LV e' medial:    2.94 cm/s LV PW:         0.85 cm   LV E/e' medial:  35.7 LV IVS:        0.85 cm   LV e' lateral:   7.40 cm/s LVOT diam:     2.20 cm   LV E/e' lateral: 14.2 LV SV:         45 LV SV Index:   22 LVOT Area:     3.80 cm  RIGHT VENTRICLE RV S prime:     9.36 cm/s TAPSE (M-mode): 2.5 cm LEFT ATRIUM             Index        RIGHT ATRIUM           Index LA diam:        3.25 cm 1.55 cm/m   RA Area:     10.50 cm LA Vol (A2C):   51.7 ml 24.66 ml/m  RA Volume:   22.30 ml  10.64 ml/m LA Vol (A4C):   57.2 ml 27.28 ml/m LA Biplane Vol: 55.5 ml 26.47 ml/m  AORTIC VALVE AV Area (Vmax):    0.53 cm AV Area (Vmean):    0.51 cm AV Area (VTI):     0.50 cm AV Vmax:           372.00 cm/s AV Vmean:          291.000 cm/s AV VTI:            0.913 m AV Peak Grad:      55.4 mmHg AV Mean Grad:      38.0 mmHg LVOT Vmax:         51.75 cm/s LVOT Vmean:        38.900 cm/s LVOT VTI:          0.119 m LVOT/AV VTI ratio: 0.13  AORTA Ao Root diam:  2.90 cm MITRAL VALVE                TRICUSPID VALVE MV Area (PHT): 4.83 cm     TR Peak grad:   16.2 mmHg MV Decel Time: 157 msec     TR Vmax:        201.00 cm/s MV E velocity: 105.00 cm/s                             SHUNTS                             Systemic VTI:  0.12 m                             Systemic Diam: 2.20 cm Arvilla Meres MD Electronically signed by Arvilla Meres MD Signature Date/Time: 02/15/2023/11:47:39 AM    Final      Patient Profile      77 year old male with past medical history of previous CVA, pulmonary embolus with antiphospholipid antibody syndrome, hypertension, diabetes mellitus, hypothyroidism, dementia for evaluation of cardiomyopathy and severe aortic stenosis. Patient was admitted with probable aspiration pneumonia. Echocardiogram has been repeated this admission shows ejection fraction 25% which is newly reduced. There is mild to moderate mitral regurgitation, severe aortic stenosis with mean gradient 38 mmHg, aortic valve area 0.5 cm and dimensionless index 0.13. Cardiology now asked to evaluate.   Assessment & Plan     1 severe aortic stenosis-as outlined previously patient has newly diagnosed cardiomyopathy with severe aortic stenosis on his echocardiogram.  I previously had a long discussion with his daughter and son/granddaughter by phone.  Patient resides in a nursing home and cannot ambulate.  He also has significant dementia.  I do not think he is a good candidate for aggressive cardiac interventions including consideration of TAVR.  They are in agreement.  He is also no CODE BLUE and would recommend palliative care consult.     2  cardiomyopathy-likely secondary to severe aortic stenosis.  Blood pressure remains borderline and in the setting of severe aortic stenosis would not add ARB/Entresto or SGLT2 inhibitor.  Will continue low-dose Toprol and spironolactone.  Will change Lasix to 20 mg by mouth daily.     3 history of antiphospholipid antibody syndrome and pulmonary embolus-he is on Coumadin.   4 paroxysmal atrial fibrillation-patient had transient atrial fibrillation in the emergency room.  He remains in sinus rhythm.  He is already on anticoagulation.  Continue beta-blocker.   5 aspiration pneumonia-Per primary care.  For questions or updates, please contact [facility] Please consult [email or web address] for contact info under        Signed, Olga Millers, MD  02/17/2023, 10:26 AM

## 2023-02-17 NOTE — Progress Notes (Signed)
Redge Gainer [phone]- [facility] Liaison Note  Received request for hospice services at inpatient hospice facility after discharge. Spoke with daughter, Efraim Kaufmann, to initiate education related to hospice philosophy, services, and team approach to care., including inpatient hospice facility care. Patient/family verbalized understanding of information given.   Reviewed with Dr. Patric Dykes, [facility] Hospice Provider, who cannot approve patient for inpatient hospice care at this time. Daughter and inpatient care team updated with this information.   Hospital liaisons will follow patient and reevaluate as needed for inpatient hospice care.    Please call with any questions or concerns.  Thank you for the opportunity to participate in this patient's care.   Glenna Fellows BSN, Charity fundraiser, OCN [employer] [phone]

## 2023-02-18 DIAGNOSIS — J690 Pneumonitis due to inhalation of food and vomit: Secondary | ICD-10-CM | POA: Diagnosis not present

## 2023-02-18 NOTE — Progress Notes (Addendum)
Palliative Medicine Inpatient Follow Up Note   HPI:  77 y.o. M with dementia, facility-dwelling, PE and APLS on warfarin, hx CVA and AS and MI who presented with dyspnea, hypoxia. Found to have Afib RVR suspected pneumonia and CHF.    The Palliative care team has been asked to get involved to assist with goals of care conversations.   Today's Discussion 02/18/2023  *Please note that this is a verbal dictation therefore any spelling or grammatical errors are due to the "Dragon Medical One" system interpretation.  Chart reviewed inclusive of vital signs, progress notes, laboratory results, and diagnostic images. Continue on ATC dilaudid and ativan.   I met with Roger Ramirez this morning. He was noted to be sleeping comfortably and did not exemplifies signs or symptoms of distress.   I met with patients daughter, Roger Ramirez. Created space and opportunity for patients daughter to explore thoughts feelings and fears regarding current medical situation. She and I discussed that Roger Ramirez had an amazing day yesterday. He was calm and peaceful. He had mental clarity which she had not seen in months. She expressed what a beautiful experience this was for she and her family. We further discussed the "rally" which often occurs preceeding patients transition to the dying process. Roger Ramirez was thankful to have had this time. Offered time and presence for reflection.   We discussed [facility] reviewing Roger Ramirez this morning for placement at [facility].  __________________________ Addendum:  I met with patients daughter and granddaughter this afternoon. They shared with me concern that patient may not be accepted to [facility]. We reviewed his present state and symptom management needs. We discussed allowing time to see where he may end up. Family stated they do not want him back at [facility] for his end of life journey as they will not be able to visit him frequently. I shared that I would reach out to the [facility] team  to gain some clarity.   Questions and concerns addressed/Palliative Support Provided.   Objective Assessment: Vital Signs Vitals:   02/18/23 0433 02/18/23 0720  BP: 106/77 105/76  Pulse: 98 94  Resp: 18 19  Temp: (!) 97.2 F (36.2 C) 97.9 F (36.6 C)  SpO2: 94% 94%    Intake/Output Summary (Last 24 hours) at 02/18/2023 0932 Last data filed at 02/18/2023 0300 Gross per 24 hour  Intake 240 ml  Output 1000 ml  Net -760 ml   Last Weight  Most recent update: 02/18/2023  3:04 AM    Weight  99 kg (218 lb 4.1 oz)            Gen: Frail elderly Caucasian male in NAD HEENT: Dry mucous membranes CV: Regular rate and irregular rhythm  PULM: On RA breathing is nonlabored ABD: soft/nontender EXT: No edema Neuro: Oriented to self  SUMMARY OF RECOMMENDATIONS   DNAR/DNI   Comfort care   Continue low-dose Dilaudid and Ativan every 6 hours around-the-clock   Additional comfort medications per [facility]   Unrestricted visitation   Appreciate transitions of care referral to [facility]   Ongoing palliative care support  Billing based on MDM: High ______________________________________________________________________________________ Roger Ramirez [place] Palliative Medicine Team Team Cell Phone: [phone] Please utilize secure chat with additional questions, if there is no response within 30 minutes please call the above phone number  Palliative Medicine Team providers are available by phone from 7am to 7pm daily and can be reached through the team cell phone.  Should this patient require assistance outside of these  hours, please call the patient's attending physician.

## 2023-02-18 NOTE — Progress Notes (Signed)
Report has been called to [facility] place.

## 2023-02-18 NOTE — Progress Notes (Signed)
PHARMACY - PHYSICIAN COMMUNICATION CRITICAL VALUE ALERT - BLOOD CULTURE IDENTIFICATION (BCID)  Roger Ramirez is an 77 y.o. male who presented to [facility] on 02/14/2023 with a chief complaint of dyspnea/hypoxia. Patient has transitioned to comfort care. In 1/2 Bcx (different set from staph hominis), patient has gram positive rods  Name of physician (or Provider) Contacted: Dr. Lazarus Salines  Current antibiotics: None  Changes to prescribed antibiotics recommended:   -No changes at this time   Results for orders placed or performed during the hospital encounter of 02/14/23  Blood Culture ID Panel (Reflexed) (Collected: 02/14/2023  9:50 PM)  Result Value Ref Range   Enterococcus faecalis NOT DETECTED NOT DETECTED   Enterococcus Faecium NOT DETECTED NOT DETECTED   Listeria monocytogenes NOT DETECTED NOT DETECTED   Staphylococcus species DETECTED (A) NOT DETECTED   Staphylococcus aureus (BCID) NOT DETECTED NOT DETECTED   Staphylococcus epidermidis NOT DETECTED NOT DETECTED   Staphylococcus lugdunensis NOT DETECTED NOT DETECTED   Streptococcus species NOT DETECTED NOT DETECTED   Streptococcus agalactiae NOT DETECTED NOT DETECTED   Streptococcus pneumoniae NOT DETECTED NOT DETECTED   Streptococcus pyogenes NOT DETECTED NOT DETECTED   A.calcoaceticus-baumannii NOT DETECTED NOT DETECTED   Bacteroides fragilis NOT DETECTED NOT DETECTED   Enterobacterales NOT DETECTED NOT DETECTED   Enterobacter cloacae complex NOT DETECTED NOT DETECTED   Escherichia coli NOT DETECTED NOT DETECTED   Klebsiella aerogenes NOT DETECTED NOT DETECTED   Klebsiella oxytoca NOT DETECTED NOT DETECTED   Klebsiella pneumoniae NOT DETECTED NOT DETECTED   Proteus species NOT DETECTED NOT DETECTED   Salmonella species NOT DETECTED NOT DETECTED   Serratia marcescens NOT DETECTED NOT DETECTED   Haemophilus influenzae NOT DETECTED NOT DETECTED   Neisseria meningitidis NOT DETECTED NOT DETECTED   Pseudomonas aeruginosa  NOT DETECTED NOT DETECTED   Stenotrophomonas maltophilia NOT DETECTED NOT DETECTED   Candida albicans NOT DETECTED NOT DETECTED   Candida auris NOT DETECTED NOT DETECTED   Candida glabrata NOT DETECTED NOT DETECTED   Candida krusei NOT DETECTED NOT DETECTED   Candida parapsilosis NOT DETECTED NOT DETECTED   Candida tropicalis NOT DETECTED NOT DETECTED   Cryptococcus neoformans/gattii NOT DETECTED NOT DETECTED    Arabella Merles, PharmD. Clinical Pharmacist 02/18/2023 4:56 AM

## 2023-02-18 NOTE — TOC Transition Note (Signed)
Transition of Care [facility]) - CM/SW Discharge Note   Patient Details  Name: Ulyess Muto MRN: [id number] Date of Birth: 1945-11-21  Transition of Care [facility]) CM/SW Contact:  Deatra Robinson, [place] Phone Number: 02/18/2023, 4:03 PM   Clinical Narrative: pt has been accepted to [facility] and they are prepared to admit today. Pt's dtr aware of dc and reports agreeable. Per [facility] with [employer], consents complete and RN has been provided with number for report. [facility] arranged for transport, requested 5:30pm pick up per [facility] request. SW signing off at dc.   Dellie Burns, MSW, LCSW [phone] (coverage)        Final next level of care: Hospice Medical Facility Barriers to Discharge: Barriers Resolved   Patient Goals and CMS Choice      Discharge Placement                  Patient to be transferred to facility by: PT[facility] Name of family member notified: [name]/Dtr Patient and family notified of of transfer: 02/18/23  Discharge Plan and Services Additional resources added to the After Visit Summary for                                       Social Determinants of Health (SDOH) Interventions SDOH Screenings   Depression (PHQ2-9): Medium Risk (10/12/2019)  Tobacco Use: Medium Risk (02/14/2023)     Readmission Risk Interventions     No data to display

## 2023-02-18 NOTE — Discharge Summary (Signed)
Physician Discharge Summary  Roger Ramirez ONG:[id number] DOB: January 05, 1946 DOA: 02/14/2023  PCP: Renford Dills, MD  Admit date: 02/14/2023 Discharge date: 02/18/2023  Admitted From: Home Disposition: Inpatient hospice  Recommendations for Outpatient Follow-up:  Transition to inpatient hospice  Discharge Condition: Stable CODE STATUS: DNR Diet recommendation: Regular as tolerated  Brief/Interim Summary: . Brief hospital course: 77 y.o. M with dementia, facility-dwelling, PE and APLS on warfarin, hx CVA and AS and MI who presented with dyspnea, hypoxia.  Found to have Afib RVR suspected pneumonia and CHF.  Patient was noted to have EF of 25% with severe aortic stenosis.  Cardiology team was consulted who recommends medical management as patient is not a good candidate for aggressive intervention.  Palliative care team consulted.  Patient has not been transitioned to comfort care. TOC working on safe disposition to inpatient hospice       Assessment and Plan: * Aspiration pneumonia (HCC) Lactic acidosis Acute on chronic combined CHF, EF 25%, new diagnosis Severe aortic stenosis Atrial fibrillation with RVR Advanced dementia History of CVA DM2 Antiphospholipid syndrome Patient is now comfort care.  Palliative care/TOC working on safe disposition  Subjective:  Daughter at bedside and according to her patient appears much more comfortable today.  No complaints.   Physical Exam: Constitutional: Not in acute distress, elderly frail.  Appears chronically ill Respiratory: Clear to auscultation bilaterally Cardiovascular: Normal sinus rhythm, no rubs Abdomen: Nontender nondistended good bowel sounds Musculoskeletal: No edema noted Skin: No rashes seen Neurologic: Drowsy Psychiatric: Poor judgment and insight   Discharge Diagnoses:  Principal Problem:   Aspiration pneumonia (HCC) Active Problems:   Acute on chronic combined systolic and diastolic CHF (congestive heart  failure) (HCC)   Severe aortic stenosis   New onset atrial fibrillation with RVR (HCC)   History of pulmonary embolus (PE)   Hypothyroidism   Antiphospholipid antibody syndrome (HCC)   Diabetes mellitus type 2, noninsulin dependent (HCC)   Obesity (BMI 30.0-34.9)   Essential hypertension   Resolved ischemic cerebrovascular accident (CVA) involving left anterior cerebral artery territory in remote past   Chronic anticoagulation   Dementia without behavioral disturbance (HCC)       Discharge Exam: Vitals:   02/18/23 0720 02/18/23 1122  BP: 105/76 101/79  Pulse: 94 81  Resp: 19 20  Temp: 97.9 F (36.6 C) 98.5 F (36.9 C)  SpO2: 94% 96%   Vitals:   02/18/23 0304 02/18/23 0433 02/18/23 0720 02/18/23 1122  BP:  106/77 105/76 101/79  Pulse:  98 94 81  Resp:  18 19 20   Temp:  (!) 97.2 F (36.2 C) 97.9 F (36.6 C) 98.5 F (36.9 C)  TempSrc:  Oral Oral Oral  SpO2:  94% 94% 96%  Weight: 99 kg     Height:         Discharge Instructions  Discharge Instructions     Amb referral to [facility] Clinic   Complete by: As directed       Allergies as of 02/18/2023   No Known Allergies      Medication List     STOP taking these medications    acetaminophen 325 MG tablet Commonly known as: TYLENOL   atorvastatin 40 MG tablet Commonly known as: LIPITOR   bisacodyl 10 MG suppository Commonly known as: DULCOLAX   carbamide peroxide 6.5 % OTIC solution Commonly known as: DEBROX   clopidogrel 75 MG tablet Commonly known as: PLAVIX   cyanocobalamin 1000 MCG tablet   furosemide 40 MG tablet Commonly  known as: LASIX   levothyroxine 75 MCG tablet Commonly known as: SYNTHROID   melatonin 3 MG Tabs tablet   metFORMIN 750 MG 24 hr tablet Commonly known as: GLUCOPHAGE-XR   methylPREDNISolone sodium succinate 125 mg/2 mL injection Commonly known as: SOLU-MEDROL   metoprolol succinate 25 MG 24 hr tablet Commonly known as: TOPROL-XL   multivitamin with minerals  Tabs tablet   nitroGLYCERIN 0.4 MG SL tablet Commonly known as: NITROSTAT   NovoLOG FlexPen 100 UNIT/ML FlexPen Generic drug: insulin aspart   Phenergan 25 MG/ML injection Generic drug: promethazine   polyethylene glycol 17 g packet Commonly known as: MIRALAX / GLYCOLAX   predniSONE 20 MG tablet Commonly known as: DELTASONE   spironolactone 25 MG tablet Commonly known as: ALDACTONE   warfarin 5 MG tablet Commonly known as: COUMADIN        No Known Allergies  You were cared for by a hospitalist during your hospital stay. If you have any questions about your discharge medications or the care you received while you were in the hospital after you are discharged, you can call the unit and asked to speak with the hospitalist on call if the hospitalist that took care of you is not available. Once you are discharged, your primary care physician will handle any further medical issues. Please note that no refills for any discharge medications will be authorized once you are discharged, as it is imperative that you return to your primary care physician (or establish a relationship with a primary care physician if you do not have one) for your aftercare needs so that they can reassess your need for medications and monitor your lab values.  You were cared for by a hospitalist during your hospital stay. If you have any questions about your discharge medications or the care you received while you were in the hospital after you are discharged, you can call the unit and asked to speak with the hospitalist on call if the hospitalist that took care of you is not available. Once you are discharged, your primary care physician will handle any further medical issues. Please note that NO REFILLS for any discharge medications will be authorized once you are discharged, as it is imperative that you return to your primary care physician (or establish a relationship with a primary care physician if you do not  have one) for your aftercare needs so that they can reassess your need for medications and monitor your lab values.  Please request your Prim.MD to go over all Hospital Tests and Procedure/Radiological results at the follow up, please get all Hospital records sent to your Prim MD by signing hospital release before you go home.  Get CBC, CMP, 2 view Chest X ray checked  by Primary MD during your next visit or SNF MD in 5-7 days ( we routinely change or add medications that can affect your baseline labs and fluid status, therefore we recommend that you get the mentioned basic workup next visit with your PCP, your PCP may decide not to get them or add new tests based on their clinical decision)  On your next visit with your primary care physician please Get Medicines reviewed and adjusted.  If you experience worsening of your admission symptoms, develop shortness of breath, life threatening emergency, suicidal or homicidal thoughts you must seek medical attention immediately by calling 911 or calling your MD immediately  if symptoms less severe.  You Must read complete instructions/literature along with all the possible adverse reactions/side effects  for all the Medicines you take and that have been prescribed to you. Take any new Medicines after you have completely understood and accpet all the possible adverse reactions/side effects.   Do not drive, operate heavy machinery, perform activities at heights, swimming or participation in water activities or provide baby sitting services if your were admitted for syncope or siezures until you have seen by Primary MD or a Neurologist and advised to do so again.  Do not drive when taking Pain medications.   Procedures/Studies: DG CHEST PORT 1 VIEW  Result Date: 02/17/2023 CLINICAL DATA:  Shortness of breath EXAM: PORTABLE CHEST 1 VIEW COMPARISON:  02/14/2023 FINDINGS: Heart and mediastinal contours within normal limits. Perihilar and lower lobe airspace  opacities, worsening since prior study. Diffuse interstitial prominence. Suspect small bilateral effusions. No acute bony abnormality. IMPRESSION: Perihilar and lower lobe airspace opacities with diffuse interstitial prominence. Findings could reflect edema or infection. Findings are worsening since prior study. Suspect small layering effusions. Electronically Signed   By: Charlett Nose M.D.   On: 02/17/2023 00:27   ECHOCARDIOGRAM COMPLETE  Result Date: 02/15/2023    ECHOCARDIOGRAM REPORT   Patient Name:   AARIC DOLPH Date of Exam: 02/15/2023 Medical Rec #:  [id number]             Height:       69.0 in Accession #:    [id number]            Weight:       207.0 lb Date of Birth:  1946/03/24             BSA:          2.096 m Patient Age:    77 years              BP:           118/98 mmHg Patient Gender: M                     HR:           68 bpm. Exam Location:  Inpatient Procedure: 2D Echo, Cardiac Doppler and Color Doppler Indications:    A fib  History:        Patient has prior history of Echocardiogram examinations, most                 recent 09/04/2019. Previous Myocardial Infarction, Aortic Valve                 Disease, Arrythmias:Atrial Fibrillation; Risk Factors:Diabetes                 and Hypertension.  Sonographer:    [name] Morford RDCS (AE, PE) Referring Phys: [id number] CLAUDIA CLAIBORNE IMPRESSIONS  1. Left ventricular ejection fraction, by estimation, is 25%. The left ventricle has normal function. The left ventricle demonstrates global hypokinesis. Left ventricular diastolic function could not be evaluated.  2. Right ventricular systolic function is normal. The right ventricular size is normal.  3. The mitral valve is degenerative. Mild to moderate mitral valve regurgitation. No evidence of mitral stenosis.  4. The aortic valve is tricuspid. There is moderate calcification of the aortic valve. Aortic valve regurgitation is not visualized. Severe aortic valve stenosis. Aortic valve mean gradient  measures 38.0 mmHg. Aortic valve Vmax measures 3.72 m/s. Conclusion(s)/Recommendation(s): Limited images due to poor sound wave transmission. EF is severely reduced and there is severe AS (gradient likely underestimated due to severely reduced EF). FINDINGS  Left Ventricle: Left ventricular ejection fraction, by estimation, is 25%. The left ventricle has normal function. The left ventricle demonstrates global hypokinesis. The left ventricular internal cavity size was normal in size. There is no left ventricular hypertrophy. Left ventricular diastolic function could not be evaluated due to atrial fibrillation. Left ventricular diastolic function could not be evaluated. Right Ventricle: The right ventricular size is normal. No increase in right ventricular wall thickness. Right ventricular systolic function is normal. Left Atrium: Left atrial size was normal in size. Right Atrium: Right atrial size was normal in size. Pericardium: There is no evidence of pericardial effusion. Mitral Valve: The mitral valve is degenerative in appearance. Mild to moderate mitral valve regurgitation. No evidence of mitral valve stenosis. Tricuspid Valve: The tricuspid valve is normal in structure. Tricuspid valve regurgitation is trivial. No evidence of tricuspid stenosis. Aortic Valve: The aortic valve is tricuspid. There is moderate calcification of the aortic valve. Aortic valve regurgitation is not visualized. Severe aortic stenosis is present. Aortic valve mean gradient measures 38.0 mmHg. Aortic valve peak gradient measures 55.4 mmHg. Aortic valve area, by VTI measures 0.50 cm. Pulmonic Valve: The pulmonic valve was not well visualized. Pulmonic valve regurgitation is not visualized. No evidence of pulmonic stenosis. Aorta: The aortic root is normal in size and structure. IAS/Shunts: No atrial level shunt detected by color flow Doppler.  LEFT VENTRICLE PLAX 2D LVIDd:         6.05 cm   Diastology LVIDs:         5.10 cm   LV e'  medial:    2.94 cm/s LV PW:         0.85 cm   LV E/e' medial:  35.7 LV IVS:        0.85 cm   LV e' lateral:   7.40 cm/s LVOT diam:     2.20 cm   LV E/e' lateral: 14.2 LV SV:         45 LV SV Index:   22 LVOT Area:     3.80 cm  RIGHT VENTRICLE RV S prime:     9.36 cm/s TAPSE (M-mode): 2.5 cm LEFT ATRIUM             Index        RIGHT ATRIUM           Index LA diam:        3.25 cm 1.55 cm/m   RA Area:     10.50 cm LA Vol (A2C):   51.7 ml 24.66 ml/m  RA Volume:   22.30 ml  10.64 ml/m LA Vol (A4C):   57.2 ml 27.28 ml/m LA Biplane Vol: 55.5 ml 26.47 ml/m  AORTIC VALVE AV Area (Vmax):    0.53 cm AV Area (Vmean):   0.51 cm AV Area (VTI):     0.50 cm AV Vmax:           372.00 cm/s AV Vmean:          291.000 cm/s AV VTI:            0.913 m AV Peak Grad:      55.4 mmHg AV Mean Grad:      38.0 mmHg LVOT Vmax:         51.75 cm/s LVOT Vmean:        38.900 cm/s LVOT VTI:          0.119 m LVOT/AV VTI ratio: 0.13  AORTA Ao Root diam: 2.90 cm MITRAL VALVE  TRICUSPID VALVE MV Area (PHT): 4.83 cm     TR Peak grad:   16.2 mmHg MV Decel Time: 157 msec     TR Vmax:        201.00 cm/s MV E velocity: 105.00 cm/s                             SHUNTS                             Systemic VTI:  0.12 m                             Systemic Diam: 2.20 cm Arvilla Meres MD Electronically signed by Arvilla Meres MD Signature Date/Time: 02/15/2023/11:47:39 AM    Final    DG Chest Port 1 View  Result Date: 02/14/2023 CLINICAL DATA:  Shortness of breath EXAM: PORTABLE CHEST 1 VIEW COMPARISON:  06/06/2018 FINDINGS: Mild bibasilar opacities, likely atelectasis, less likely pneumonia. Suspected small left pleural effusion. No pneumothorax. Heart is normal in size.  Thoracic aortic atherosclerosis. IMPRESSION: Mild bibasilar opacities, likely atelectasis, less likely pneumonia. Suspected small left pleural effusion. Electronically Signed   By: Charline Bills M.D.   On: 02/14/2023 21:05     The results of significant  diagnostics from this hospitalization (including imaging, microbiology, ancillary and laboratory) are listed below for reference.     Microbiology: Recent Results (from the past 240 hour(s))  Resp panel by RT-PCR (RSV, Flu A&B, Covid) Anterior Nasal Swab     Status: None   Collection Time: 02/14/23  9:07 PM   Specimen: Anterior Nasal Swab  Result Value Ref Range Status   SARS Coronavirus 2 by RT PCR NEGATIVE NEGATIVE Final   Influenza A by PCR NEGATIVE NEGATIVE Final   Influenza B by PCR NEGATIVE NEGATIVE Final    Comment: (NOTE) The Xpert Xpress SARS-CoV-2/FLU/RSV plus assay is intended as an aid in the diagnosis of influenza from Nasopharyngeal swab specimens and should not be used as a sole basis for treatment. Nasal washings and aspirates are unacceptable for Xpert Xpress SARS-CoV-2/FLU/RSV testing.  Fact Sheet for Patients: BloggerCourse.com  Fact Sheet for Healthcare Providers: SeriousBroker.it  This test is not yet approved or cleared by the [place] FDA and has been authorized for detection and/or diagnosis of SARS-CoV-2 by FDA under an Emergency Use Authorization (EUA). This EUA will remain in effect (meaning this test can be used) for the duration of the COVID-19 declaration under Section 564(b)(1) of the Act, 21 U.S.C. section 360bbb-3(b)(1), unless the authorization is terminated or revoked.     Resp Syncytial Virus by PCR NEGATIVE NEGATIVE Final    Comment: (NOTE) Fact Sheet for Patients: BloggerCourse.com  Fact Sheet for Healthcare Providers: SeriousBroker.it  This test is not yet approved or cleared by the [place] FDA and has been authorized for detection and/or diagnosis of SARS-CoV-2 by FDA under an Emergency Use Authorization (EUA). This EUA will remain in effect (meaning this test can be used) for the duration of the COVID-19 declaration under  Section 564(b)(1) of the Act, 21 U.S.C. section 360bbb-3(b)(1), unless the authorization is terminated or revoked.  Performed at [facility], [address], [place], [place] [address]   Blood culture (routine x 2)     Status: Abnormal   Collection Time: 02/14/23  9:50 PM   Specimen:  BLOOD  Result Value Ref Range Status   Specimen Description BLOOD RIGHT ANTECUBITAL  Final   Special Requests   Final    BOTTLES DRAWN AEROBIC AND ANAEROBIC Blood Culture adequate volume   Culture  Setup Time   Final    GRAM POSITIVE COCCI BOTTLES DRAWN AEROBIC ONLY CRITICAL RESULT CALLED TO, READ BACK BY AND VERIFIED WITH: PHARMD J. MILLEN [id number] @ 1840 FH    Culture (A)  Final    STAPHYLOCOCCUS HOMINIS THE SIGNIFICANCE OF ISOLATING THIS ORGANISM FROM A SINGLE SET OF BLOOD CULTURES WHEN MULTIPLE SETS ARE DRAWN IS UNCERTAIN. PLEASE NOTIFY THE MICROBIOLOGY DEPARTMENT WITHIN ONE WEEK IF SPECIATION AND SENSITIVITIES ARE REQUIRED. Performed at [facility], [address], [place], [place] [address]    Report Status 02/17/2023 FINAL  Final  Blood Culture ID Panel (Reflexed)     Status: Abnormal   Collection Time: 02/14/23  9:50 PM  Result Value Ref Range Status   Enterococcus faecalis NOT DETECTED NOT DETECTED Final   Enterococcus Faecium NOT DETECTED NOT DETECTED Final   Listeria monocytogenes NOT DETECTED NOT DETECTED Final   Staphylococcus species DETECTED (A) NOT DETECTED Final    Comment: CRITICAL RESULT CALLED TO, READ BACK BY AND VERIFIED WITH: PHARMD J. MILLEN [id number] @ 1840 FH    Staphylococcus aureus (BCID) NOT DETECTED NOT DETECTED Final   Staphylococcus epidermidis NOT DETECTED NOT DETECTED Final   Staphylococcus lugdunensis NOT DETECTED NOT DETECTED Final   Streptococcus species NOT DETECTED NOT DETECTED Final   Streptococcus agalactiae NOT DETECTED NOT DETECTED Final   Streptococcus pneumoniae NOT DETECTED NOT DETECTED Final   Streptococcus pyogenes NOT DETECTED NOT DETECTED  Final   A.calcoaceticus-baumannii NOT DETECTED NOT DETECTED Final   Bacteroides fragilis NOT DETECTED NOT DETECTED Final   Enterobacterales NOT DETECTED NOT DETECTED Final   Enterobacter cloacae complex NOT DETECTED NOT DETECTED Final   Escherichia coli NOT DETECTED NOT DETECTED Final   Klebsiella aerogenes NOT DETECTED NOT DETECTED Final   Klebsiella oxytoca NOT DETECTED NOT DETECTED Final   Klebsiella pneumoniae NOT DETECTED NOT DETECTED Final   Proteus species NOT DETECTED NOT DETECTED Final   Salmonella species NOT DETECTED NOT DETECTED Final   Serratia marcescens NOT DETECTED NOT DETECTED Final   Haemophilus influenzae NOT DETECTED NOT DETECTED Final   Neisseria meningitidis NOT DETECTED NOT DETECTED Final   Pseudomonas aeruginosa NOT DETECTED NOT DETECTED Final   Stenotrophomonas maltophilia NOT DETECTED NOT DETECTED Final   Candida albicans NOT DETECTED NOT DETECTED Final   Candida auris NOT DETECTED NOT DETECTED Final   Candida glabrata NOT DETECTED NOT DETECTED Final   Candida krusei NOT DETECTED NOT DETECTED Final   Candida parapsilosis NOT DETECTED NOT DETECTED Final   Candida tropicalis NOT DETECTED NOT DETECTED Final   Cryptococcus neoformans/gattii NOT DETECTED NOT DETECTED Final    Comment: Performed at [facility], [address], [place], [place] [address]  Blood culture (routine x 2)     Status: None (Preliminary result)   Collection Time: 02/14/23  9:52 PM   Specimen: BLOOD LEFT HAND  Result Value Ref Range Status   Specimen Description BLOOD LEFT HAND  Final   Special Requests   Final    BOTTLES DRAWN AEROBIC AND ANAEROBIC Blood Culture adequate volume   Culture  Setup Time   Final    GRAM POSITIVE RODS ANAEROBIC BOTTLE ONLY CRITICAL RESULT CALLED TO, READ BACK BY AND VERIFIED WITH: [facility]  02/18/23 MK Performed at [facility], [address], [place], [place] [address]    Culture GRAM POSITIVE RODS  Final   Report Status PENDING   Incomplete  Urine Culture     Status: Abnormal   Collection Time: 02/14/23 10:44 PM   Specimen: Urine, Random  Result Value Ref Range Status   Specimen Description URINE, RANDOM  Final   Special Requests   Final    NONE Reflexed from [id number] Performed at [facility], [address] [place], [place] [address]    Culture >=100,000 COLONIES/mL ESCHERICHIA COLI (A)  Final   Report Status 02/17/2023 FINAL  Final   Organism ID, Bacteria ESCHERICHIA COLI (A)  Final      Susceptibility   Escherichia coli - MIC*    AMPICILLIN >=32 RESISTANT Resistant     CEFAZOLIN 8 SENSITIVE Sensitive     CEFEPIME <=0.12 SENSITIVE Sensitive     CEFTRIAXONE <=0.25 SENSITIVE Sensitive     CIPROFLOXACIN >=4 RESISTANT Resistant     GENTAMICIN <=1 SENSITIVE Sensitive     IMIPENEM <=0.25 SENSITIVE Sensitive     NITROFURANTOIN <=16 SENSITIVE Sensitive     TRIMETH/SULFA >=320 RESISTANT Resistant     AMPICILLIN/SULBACTAM 16 INTERMEDIATE Intermediate     PIP/TAZO <=4 SENSITIVE Sensitive ug/mL    * >=100,000 COLONIES/mL ESCHERICHIA COLI     Labs: BNP (last 3 results) Recent Labs    02/14/23 2041 02/17/23 0831  BNP 684.2* 1,526.0*   Basic Metabolic Panel: Recent Labs  Lab 02/14/23 2039 02/14/23 2052 02/14/23 2055 02/15/23 0311 02/16/23 0455 02/17/23 0831  NA 137 138 137 136 134* 133*  K 3.5 3.5 3.4* 3.9 4.3 4.0  CL 101  --  103 104 104 102  CO2 23  --   --  19* 21* 21*  GLUCOSE 206*  --  211* 198* 135* 141*  BUN 17  --  20 19 29* 27*  CREATININE 1.15  --  0.90 1.15 1.02 1.05  CALCIUM 9.3  --   --  9.0 8.6* 8.5*   Liver Function Tests: Recent Labs  Lab 02/14/23 2039 02/16/23 0455 02/17/23 0831  AST 35 29 39  ALT 20 18 22   ALKPHOS 78 67 69  BILITOT 1.0 0.7 0.8  PROT 7.1 6.4* 6.5  ALBUMIN 3.3* 3.0* 2.9*   No results for input(s): "LIPASE", "AMYLASE" in the last 168 hours. No results for input(s): "AMMONIA" in the last 168 hours. CBC: Recent Labs  Lab 02/14/23 2039  02/14/23 2052 02/14/23 2055 02/15/23 0311 02/15/23 0514 02/16/23 0455 02/17/23 0831  WBC 7.8  --   --  16.2* 15.3* 14.7* 9.5  NEUTROABS 7.2  --   --   --   --   --   --   HGB 13.0   < > 13.6 12.5* 12.3* 12.2* 12.2*  HCT 40.2   < > 40.0 38.0* 37.7* 37.6* 36.9*  MCV 89.3  --   --  89.4 88.3 89.7 88.3  PLT 221  --   --  226 224 211 219   < > = values in this interval not displayed.   Cardiac Enzymes: No results for input(s): "CKTOTAL", "CKMB", "CKMBINDEX", "TROPONINI" in the last 168 hours. BNP: Invalid input(s): "POCBNP" CBG: Recent Labs  Lab 02/16/23 0614 02/16/23 1101 02/16/23 1614 02/16/23 1949 02/17/23 0525  GLUCAP 113* 137* 148* 198* 162*   D-Dimer No results for input(s): "DDIMER" in the last 72 hours. Hgb A1c Recent Labs    02/16/23 0455  HGBA1C 6.0*  Lipid Profile No results for input(s): "CHOL", "HDL", "LDLCALC", "TRIG", "CHOLHDL", "LDLDIRECT" in the last 72 hours. Thyroid function studies No results for input(s): "TSH", "T4TOTAL", "T3FREE", "THYROIDAB" in the last 72 hours.  Invalid input(s): "FREET3" Anemia work up No results for input(s): "VITAMINB12", "FOLATE", "FERRITIN", "TIBC", "IRON", "RETICCTPCT" in the last 72 hours. Urinalysis    Component Value Date/Time   COLORURINE YELLOW 02/14/2023 2244   APPEARANCEUR CLOUDY (A) 02/14/2023 2244   LABSPEC 1.012 02/14/2023 2244   PHURINE 5.0 02/14/2023 2244   GLUCOSEU NEGATIVE 02/14/2023 2244   HGBUR LARGE (A) 02/14/2023 2244   BILIRUBINUR NEGATIVE 02/14/2023 2244   KETONESUR NEGATIVE 02/14/2023 2244   PROTEINUR 30 (A) 02/14/2023 2244   UROBILINOGEN 1.0 12/24/2014 1356   NITRITE POSITIVE (A) 02/14/2023 2244   LEUKOCYTESUR LARGE (A) 02/14/2023 2244   Sepsis Labs Recent Labs  Lab 02/15/23 0311 02/15/23 0514 02/16/23 0455 02/17/23 0831  WBC 16.2* 15.3* 14.7* 9.5   Microbiology Recent Results (from the past 240 hour(s))  Resp panel by RT-PCR (RSV, Flu A&B, Covid) Anterior Nasal Swab     Status:  None   Collection Time: 02/14/23  9:07 PM   Specimen: Anterior Nasal Swab  Result Value Ref Range Status   SARS Coronavirus 2 by RT PCR NEGATIVE NEGATIVE Final   Influenza A by PCR NEGATIVE NEGATIVE Final   Influenza B by PCR NEGATIVE NEGATIVE Final    Comment: (NOTE) The Xpert Xpress SARS-CoV-2/FLU/RSV plus assay is intended as an aid in the diagnosis of influenza from Nasopharyngeal swab specimens and should not be used as a sole basis for treatment. Nasal washings and aspirates are unacceptable for Xpert Xpress SARS-CoV-2/FLU/RSV testing.  Fact Sheet for Patients: BloggerCourse.com  Fact Sheet for Healthcare Providers: SeriousBroker.it  This test is not yet approved or cleared by the [place] FDA and has been authorized for detection and/or diagnosis of SARS-CoV-2 by FDA under an Emergency Use Authorization (EUA). This EUA will remain in effect (meaning this test can be used) for the duration of the COVID-19 declaration under Section 564(b)(1) of the Act, 21 U.S.C. section 360bbb-3(b)(1), unless the authorization is terminated or revoked.     Resp Syncytial Virus by PCR NEGATIVE NEGATIVE Final    Comment: (NOTE) Fact Sheet for Patients: BloggerCourse.com  Fact Sheet for Healthcare Providers: SeriousBroker.it  This test is not yet approved or cleared by the [place] FDA and has been authorized for detection and/or diagnosis of SARS-CoV-2 by FDA under an Emergency Use Authorization (EUA). This EUA will remain in effect (meaning this test can be used) for the duration of the COVID-19 declaration under Section 564(b)(1) of the Act, 21 U.S.C. section 360bbb-3(b)(1), unless the authorization is terminated or revoked.  Performed at [facility], [address], [place], [place] [address]   Blood culture (routine x 2)     Status: Abnormal   Collection Time:  02/14/23  9:50 PM   Specimen: BLOOD  Result Value Ref Range Status   Specimen Description BLOOD RIGHT ANTECUBITAL  Final   Special Requests   Final    BOTTLES DRAWN AEROBIC AND ANAEROBIC Blood Culture adequate volume   Culture  Setup Time   Final    GRAM POSITIVE COCCI BOTTLES DRAWN AEROBIC ONLY CRITICAL RESULT CALLED TO, READ BACK BY AND VERIFIED WITH: PHARMD J. MILLEN [id number] @ 1840 FH    Culture (A)  Final    STAPHYLOCOCCUS HOMINIS THE SIGNIFICANCE OF ISOLATING THIS ORGANISM FROM A SINGLE SET OF BLOOD CULTURES WHEN  MULTIPLE SETS ARE DRAWN IS UNCERTAIN. PLEASE NOTIFY THE MICROBIOLOGY DEPARTMENT WITHIN ONE WEEK IF SPECIATION AND SENSITIVITIES ARE REQUIRED. Performed at [facility], [address] [place], [place] [address]    Report Status 02/17/2023 FINAL  Final  Blood Culture ID Panel (Reflexed)     Status: Abnormal   Collection Time: 02/14/23  9:50 PM  Result Value Ref Range Status   Enterococcus faecalis NOT DETECTED NOT DETECTED Final   Enterococcus Faecium NOT DETECTED NOT DETECTED Final   Listeria monocytogenes NOT DETECTED NOT DETECTED Final   Staphylococcus species DETECTED (A) NOT DETECTED Final    Comment: CRITICAL RESULT CALLED TO, READ BACK BY AND VERIFIED WITH: PHARMD J. MILLEN [id number] @ 1840 FH    Staphylococcus aureus (BCID) NOT DETECTED NOT DETECTED Final   Staphylococcus epidermidis NOT DETECTED NOT DETECTED Final   Staphylococcus lugdunensis NOT DETECTED NOT DETECTED Final   Streptococcus species NOT DETECTED NOT DETECTED Final   Streptococcus agalactiae NOT DETECTED NOT DETECTED Final   Streptococcus pneumoniae NOT DETECTED NOT DETECTED Final   Streptococcus pyogenes NOT DETECTED NOT DETECTED Final   A.calcoaceticus-baumannii NOT DETECTED NOT DETECTED Final   Bacteroides fragilis NOT DETECTED NOT DETECTED Final   Enterobacterales NOT DETECTED NOT DETECTED Final   Enterobacter cloacae complex NOT DETECTED NOT DETECTED Final   Escherichia coli NOT  DETECTED NOT DETECTED Final   Klebsiella aerogenes NOT DETECTED NOT DETECTED Final   Klebsiella oxytoca NOT DETECTED NOT DETECTED Final   Klebsiella pneumoniae NOT DETECTED NOT DETECTED Final   Proteus species NOT DETECTED NOT DETECTED Final   Salmonella species NOT DETECTED NOT DETECTED Final   Serratia marcescens NOT DETECTED NOT DETECTED Final   Haemophilus influenzae NOT DETECTED NOT DETECTED Final   Neisseria meningitidis NOT DETECTED NOT DETECTED Final   Pseudomonas aeruginosa NOT DETECTED NOT DETECTED Final   Stenotrophomonas maltophilia NOT DETECTED NOT DETECTED Final   Candida albicans NOT DETECTED NOT DETECTED Final   Candida auris NOT DETECTED NOT DETECTED Final   Candida glabrata NOT DETECTED NOT DETECTED Final   Candida krusei NOT DETECTED NOT DETECTED Final   Candida parapsilosis NOT DETECTED NOT DETECTED Final   Candida tropicalis NOT DETECTED NOT DETECTED Final   Cryptococcus neoformans/gattii NOT DETECTED NOT DETECTED Final    Comment: Performed at [facility], [address] [place], [place] [address]  Blood culture (routine x 2)     Status: None (Preliminary result)   Collection Time: 02/14/23  9:52 PM   Specimen: BLOOD LEFT HAND  Result Value Ref Range Status   Specimen Description BLOOD LEFT HAND  Final   Special Requests   Final    BOTTLES DRAWN AEROBIC AND ANAEROBIC Blood Culture adequate volume   Culture  Setup Time   Final    GRAM POSITIVE RODS ANAEROBIC BOTTLE ONLY CRITICAL RESULT CALLED TO, READ BACK BY AND VERIFIED WITH: J WYLAND,[id number]  02/18/23 MK Performed at [facility], [address] [place], [place] [address]    Culture GRAM POSITIVE RODS  Final   Report Status PENDING  Incomplete  Urine Culture     Status: Abnormal   Collection Time: 02/14/23 10:44 PM   Specimen: Urine, Random  Result Value Ref Range Status   Specimen Description URINE, RANDOM  Final   Special Requests   Final    NONE Reflexed from [id number] Performed at  [facility], [address], [place], [place] [address]    Culture >=100,000 COLONIES/mL ESCHERICHIA COLI (A)  Final   Report Status 02/17/2023 FINAL  Final   Organism ID, Bacteria ESCHERICHIA COLI (A)  Final      Susceptibility   Escherichia coli - MIC*    AMPICILLIN >=32 RESISTANT Resistant     CEFAZOLIN 8 SENSITIVE Sensitive     CEFEPIME <=0.12 SENSITIVE Sensitive     CEFTRIAXONE <=0.25 SENSITIVE Sensitive     CIPROFLOXACIN >=4 RESISTANT Resistant     GENTAMICIN <=1 SENSITIVE Sensitive     IMIPENEM <=0.25 SENSITIVE Sensitive     NITROFURANTOIN <=16 SENSITIVE Sensitive     TRIMETH/SULFA >=320 RESISTANT Resistant     AMPICILLIN/SULBACTAM 16 INTERMEDIATE Intermediate     PIP/TAZO <=4 SENSITIVE Sensitive ug/mL    * >=100,000 COLONIES/mL ESCHERICHIA COLI     Time coordinating discharge:  I have spent 35 minutes face to face with the patient and on the ward discussing the patients care, assessment, plan and disposition with other care givers. >50% of the time was devoted counseling the patient about the risks and benefits of treatment/Discharge disposition and coordinating care.   SIGNED:   Miguel Rota, MD  [facility] 02/18/2023, 11:43 AM   If 7PM-7AM, please contact night-coverage

## 2023-02-18 NOTE — Progress Notes (Signed)
[facility] [id number]  Hospice hospital liaison note  Met with patient and daughter at bedside. Patient is approved for [facility] and we will be able to accept him once consents are complete.   Please send signed DNR and leave IV's intact   Nurse can call report at any time to [phone]  Thank you Thea Gist, BSN RN Hospice hospital liaison [phone]

## 2023-02-18 NOTE — Plan of Care (Signed)

## 2023-02-20 LAB — CULTURE, BLOOD (ROUTINE X 2): Special Requests: ADEQUATE

## 2023-03-14 DEATH — deceased
